# Patient Record
Sex: Female | Born: 1950 | Race: White | Hispanic: No | Marital: Married | Smoking: Never smoker
Health system: Southern US, Community
[De-identification: ages and names within clinical notes are randomized; demographics above are authoritative.]

## PROBLEM LIST (undated history)

## (undated) DIAGNOSIS — Z7189 Other specified counseling: Secondary | ICD-10-CM

## (undated) DIAGNOSIS — G459 Transient cerebral ischemic attack, unspecified: Secondary | ICD-10-CM

## (undated) DIAGNOSIS — Z923 Personal history of irradiation: Secondary | ICD-10-CM

## (undated) DIAGNOSIS — N189 Chronic kidney disease, unspecified: Secondary | ICD-10-CM

## (undated) DIAGNOSIS — Z9221 Personal history of antineoplastic chemotherapy: Secondary | ICD-10-CM

## (undated) DIAGNOSIS — I10 Essential (primary) hypertension: Secondary | ICD-10-CM

## (undated) DIAGNOSIS — C801 Malignant (primary) neoplasm, unspecified: Secondary | ICD-10-CM

## (undated) DIAGNOSIS — C9000 Multiple myeloma not having achieved remission: Secondary | ICD-10-CM

## (undated) DIAGNOSIS — I2699 Other pulmonary embolism without acute cor pulmonale: Secondary | ICD-10-CM

## (undated) DIAGNOSIS — C50911 Malignant neoplasm of unspecified site of right female breast: Secondary | ICD-10-CM

## (undated) DIAGNOSIS — I82409 Acute embolism and thrombosis of unspecified deep veins of unspecified lower extremity: Secondary | ICD-10-CM

## (undated) DIAGNOSIS — D649 Anemia, unspecified: Secondary | ICD-10-CM

## (undated) HISTORY — PX: APPENDECTOMY: SHX54

## (undated) HISTORY — PX: BREAST SURGERY: SHX581

## (undated) HISTORY — DX: Other specified counseling: Z71.89

## (undated) HISTORY — PX: BREAST LUMPECTOMY: SHX2

## (undated) HISTORY — PX: ABDOMINAL HYSTERECTOMY: SHX81

## (undated) HISTORY — DX: Malignant neoplasm of unspecified site of right female breast: C50.911

## (undated) HISTORY — PX: TUBAL LIGATION: SHX77

---

## 2002-09-13 ENCOUNTER — Other Ambulatory Visit: Admission: RE | Admit: 2002-09-13 | Discharge: 2002-09-13 | Payer: Self-pay | Admitting: Obstetrics & Gynecology

## 2005-01-08 ENCOUNTER — Encounter: Admission: RE | Admit: 2005-01-08 | Discharge: 2005-01-08 | Payer: Self-pay

## 2005-02-28 ENCOUNTER — Other Ambulatory Visit: Admission: RE | Admit: 2005-02-28 | Discharge: 2005-02-28 | Payer: Self-pay | Admitting: Obstetrics and Gynecology

## 2005-05-02 ENCOUNTER — Ambulatory Visit: Payer: Self-pay | Admitting: Internal Medicine

## 2005-06-19 ENCOUNTER — Observation Stay (HOSPITAL_COMMUNITY): Admission: RE | Admit: 2005-06-19 | Discharge: 2005-06-20 | Payer: Self-pay | Admitting: Obstetrics and Gynecology

## 2005-08-08 ENCOUNTER — Ambulatory Visit (HOSPITAL_COMMUNITY): Admission: RE | Admit: 2005-08-08 | Discharge: 2005-08-08 | Payer: Self-pay | Admitting: Obstetrics and Gynecology

## 2005-08-13 ENCOUNTER — Ambulatory Visit: Payer: Self-pay | Admitting: Internal Medicine

## 2007-07-30 DIAGNOSIS — I10 Essential (primary) hypertension: Secondary | ICD-10-CM

## 2008-02-17 ENCOUNTER — Encounter: Admission: RE | Admit: 2008-02-17 | Discharge: 2008-02-17 | Payer: Self-pay | Admitting: Internal Medicine

## 2009-03-19 ENCOUNTER — Encounter: Admission: RE | Admit: 2009-03-19 | Discharge: 2009-03-19 | Payer: Self-pay | Admitting: Internal Medicine

## 2009-03-20 ENCOUNTER — Inpatient Hospital Stay (HOSPITAL_COMMUNITY): Admission: EM | Admit: 2009-03-20 | Discharge: 2009-03-27 | Payer: Self-pay | Admitting: Emergency Medicine

## 2009-03-23 ENCOUNTER — Ambulatory Visit: Payer: Self-pay | Admitting: Hematology & Oncology

## 2009-03-27 ENCOUNTER — Encounter (INDEPENDENT_AMBULATORY_CARE_PROVIDER_SITE_OTHER): Payer: Self-pay | Admitting: Interventional Radiology

## 2009-03-29 ENCOUNTER — Ambulatory Visit: Payer: Self-pay | Admitting: Hematology & Oncology

## 2009-03-30 LAB — CBC WITH DIFFERENTIAL (CANCER CENTER ONLY)
BASO%: 0.5 % (ref 0.0–2.0)
EOS%: 2.4 % (ref 0.0–7.0)
MCH: 33.1 pg (ref 26.0–34.0)
MCHC: 35.1 g/dL (ref 32.0–36.0)
MONO%: 5.6 % (ref 0.0–13.0)
NEUT#: 3.7 10*3/uL (ref 1.5–6.5)
Platelets: 232 10*3/uL (ref 145–400)
RBC: 3.06 10*6/uL — ABNORMAL LOW (ref 3.70–5.32)

## 2009-03-30 LAB — CMP (CANCER CENTER ONLY)
AST: 22 U/L (ref 11–38)
BUN, Bld: 60 mg/dL — ABNORMAL HIGH (ref 7–22)
Calcium: 9.4 mg/dL (ref 8.0–10.3)
Chloride: 100 mEq/L (ref 98–108)
Creat: 6.2 mg/dl — ABNORMAL HIGH (ref 0.6–1.2)
Glucose, Bld: 116 mg/dL (ref 73–118)

## 2009-03-30 LAB — CHCC SATELLITE - SMEAR

## 2009-04-03 LAB — SPEP & IFE WITH QIG
Albumin ELP: 63.4 % (ref 55.8–66.1)
Alpha-2-Globulin: 14.1 % — ABNORMAL HIGH (ref 7.1–11.8)
Beta Globulin: 5.5 % (ref 4.7–7.2)
Total Protein, Serum Electrophoresis: 7.4 g/dL (ref 6.0–8.3)

## 2009-04-03 LAB — KAPPA/LAMBDA LIGHT CHAINS: Kappa free light chain: 1550 mg/dL — ABNORMAL HIGH (ref 0.33–1.94)

## 2009-04-16 LAB — CMP (CANCER CENTER ONLY)
AST: 36 U/L (ref 11–38)
Albumin: 3.6 g/dL (ref 3.3–5.5)
BUN, Bld: 39 mg/dL — ABNORMAL HIGH (ref 7–22)
Calcium: 9.1 mg/dL (ref 8.0–10.3)
Chloride: 106 mEq/L (ref 98–108)
Glucose, Bld: 105 mg/dL (ref 73–118)
Potassium: 3.9 mEq/L (ref 3.3–4.7)
Total Protein: 6.8 g/dL (ref 6.4–8.1)

## 2009-04-16 LAB — CBC WITH DIFFERENTIAL (CANCER CENTER ONLY)
Eosinophils Absolute: 0.1 10*3/uL (ref 0.0–0.5)
HCT: 24.4 % — ABNORMAL LOW (ref 34.8–46.6)
LYMPH#: 1.5 10*3/uL (ref 0.9–3.3)
LYMPH%: 36.4 % (ref 14.0–48.0)
MCV: 95 fL (ref 81–101)
MONO#: 0.2 10*3/uL (ref 0.1–0.9)
Platelets: 143 10*3/uL — ABNORMAL LOW (ref 145–400)
RBC: 2.57 10*6/uL — ABNORMAL LOW (ref 3.70–5.32)
WBC: 4.2 10*3/uL (ref 3.9–10.0)

## 2009-04-16 LAB — URINALYSIS, MICROSCOPIC (CHCC SATELLITE)
Ketones: NEGATIVE mg/dL
Protein: 100 mg/dL

## 2009-05-02 LAB — URINALYSIS, MICROSCOPIC (CHCC SATELLITE)
Nitrite: POSITIVE
Protein: 100 mg/dL
Specific Gravity, Urine: 1.01 (ref 1.003–1.035)

## 2009-05-21 ENCOUNTER — Ambulatory Visit: Payer: Self-pay | Admitting: Hematology & Oncology

## 2009-05-22 LAB — CMP (CANCER CENTER ONLY)
ALT(SGPT): 70 U/L — ABNORMAL HIGH (ref 10–47)
Albumin: 3.4 g/dL (ref 3.3–5.5)
Alkaline Phosphatase: 85 U/L — ABNORMAL HIGH (ref 26–84)
CO2: 24 mEq/L (ref 18–33)
Glucose, Bld: 101 mg/dL (ref 73–118)
Potassium: 4.1 mEq/L (ref 3.3–4.7)
Sodium: 139 mEq/L (ref 128–145)
Total Protein: 6.2 g/dL — ABNORMAL LOW (ref 6.4–8.1)

## 2009-05-22 LAB — CBC WITH DIFFERENTIAL (CANCER CENTER ONLY)
Eosinophils Absolute: 0 10*3/uL (ref 0.0–0.5)
MONO#: 0.1 10*3/uL (ref 0.1–0.9)
NEUT#: 0.4 10*3/uL — CL (ref 1.5–6.5)
Platelets: 83 10*3/uL — ABNORMAL LOW (ref 145–400)
RBC: 2.51 10*6/uL — ABNORMAL LOW (ref 3.70–5.32)
WBC: 1.3 10*3/uL — ABNORMAL LOW (ref 3.9–10.0)

## 2009-05-25 LAB — KAPPA/LAMBDA LIGHT CHAINS: Lambda Free Lght Chn: 1.38 mg/dL (ref 0.57–2.63)

## 2009-06-25 ENCOUNTER — Ambulatory Visit: Payer: Self-pay | Admitting: Hematology & Oncology

## 2009-06-26 LAB — CBC WITH DIFFERENTIAL (CANCER CENTER ONLY)
BASO%: 0.6 % (ref 0.0–2.0)
EOS%: 1.4 % (ref 0.0–7.0)
LYMPH#: 1.6 10*3/uL (ref 0.9–3.3)
MCHC: 33.6 g/dL (ref 32.0–36.0)
MONO#: 0.2 10*3/uL (ref 0.1–0.9)
NEUT#: 1.4 10*3/uL — ABNORMAL LOW (ref 1.5–6.5)
Platelets: 262 10*3/uL (ref 145–400)
RDW: 15.1 % — ABNORMAL HIGH (ref 10.5–14.6)
WBC: 3.3 10*3/uL — ABNORMAL LOW (ref 3.9–10.0)

## 2009-06-27 LAB — COMPREHENSIVE METABOLIC PANEL
AST: 30 U/L (ref 0–37)
Albumin: 4.3 g/dL (ref 3.5–5.2)
Alkaline Phosphatase: 85 U/L (ref 39–117)
BUN: 32 mg/dL — ABNORMAL HIGH (ref 6–23)
Creatinine, Ser: 2.96 mg/dL — ABNORMAL HIGH (ref 0.40–1.20)
Potassium: 4 mEq/L (ref 3.5–5.3)

## 2009-06-27 LAB — KAPPA/LAMBDA LIGHT CHAINS: Lambda Free Lght Chn: 1.21 mg/dL (ref 0.57–2.63)

## 2009-07-10 LAB — CBC WITH DIFFERENTIAL (CANCER CENTER ONLY)
BASO%: 1.1 % (ref 0.0–2.0)
EOS%: 3.9 % (ref 0.0–7.0)
HCT: 28.4 % — ABNORMAL LOW (ref 34.8–46.6)
LYMPH%: 43.5 % (ref 14.0–48.0)
MCH: 34.2 pg — ABNORMAL HIGH (ref 26.0–34.0)
MCHC: 33.2 g/dL (ref 32.0–36.0)
MCV: 103 fL — ABNORMAL HIGH (ref 81–101)
MONO#: 0.5 10*3/uL (ref 0.1–0.9)
MONO%: 12.3 % (ref 0.0–13.0)
NEUT%: 39.2 % — ABNORMAL LOW (ref 39.6–80.0)
Platelets: 142 10*3/uL — ABNORMAL LOW (ref 145–400)
RDW: 13.6 % (ref 10.5–14.6)
WBC: 4.1 10*3/uL (ref 3.9–10.0)

## 2009-07-17 LAB — CBC WITH DIFFERENTIAL (CANCER CENTER ONLY)
Eosinophils Absolute: 0.2 10*3/uL (ref 0.0–0.5)
HCT: 28.4 % — ABNORMAL LOW (ref 34.8–46.6)
LYMPH%: 34 % (ref 14.0–48.0)
MCH: 35 pg — ABNORMAL HIGH (ref 26.0–34.0)
MCV: 102 fL — ABNORMAL HIGH (ref 81–101)
MONO%: 8.1 % (ref 0.0–13.0)
NEUT%: 53.4 % (ref 39.6–80.0)
Platelets: 135 10*3/uL — ABNORMAL LOW (ref 145–400)
RBC: 2.77 10*6/uL — ABNORMAL LOW (ref 3.70–5.32)

## 2009-07-24 LAB — CBC WITH DIFFERENTIAL (CANCER CENTER ONLY)
Eosinophils Absolute: 0.2 10*3/uL (ref 0.0–0.5)
HCT: 29.6 % — ABNORMAL LOW (ref 34.8–46.6)
LYMPH#: 1.8 10*3/uL (ref 0.9–3.3)
LYMPH%: 42.8 % (ref 14.0–48.0)
MCV: 101 fL (ref 81–101)
MONO#: 0.2 10*3/uL (ref 0.1–0.9)
Platelets: 93 10*3/uL — ABNORMAL LOW (ref 145–400)
RBC: 2.93 10*6/uL — ABNORMAL LOW (ref 3.70–5.32)
RDW: 12.6 % (ref 10.5–14.6)
WBC: 4.3 10*3/uL (ref 3.9–10.0)

## 2009-07-27 ENCOUNTER — Ambulatory Visit: Payer: Self-pay | Admitting: Hematology & Oncology

## 2009-07-31 LAB — CBC WITH DIFFERENTIAL (CANCER CENTER ONLY)
BASO#: 0 10*3/uL (ref 0.0–0.2)
BASO%: 1.1 % (ref 0.0–2.0)
HCT: 28.5 % — ABNORMAL LOW (ref 34.8–46.6)
HGB: 9.6 g/dL — ABNORMAL LOW (ref 11.6–15.9)
LYMPH#: 1.4 10*3/uL (ref 0.9–3.3)
MONO#: 0.1 10*3/uL (ref 0.1–0.9)
NEUT%: 40.6 % (ref 39.6–80.0)
RBC: 2.8 10*6/uL — ABNORMAL LOW (ref 3.70–5.32)
WBC: 3 10*3/uL — ABNORMAL LOW (ref 3.9–10.0)

## 2009-08-01 LAB — COMPREHENSIVE METABOLIC PANEL
ALT: 47 U/L — ABNORMAL HIGH (ref 0–35)
AST: 18 U/L (ref 0–37)
Alkaline Phosphatase: 74 U/L (ref 39–117)
Calcium: 8.1 mg/dL — ABNORMAL LOW (ref 8.4–10.5)
Chloride: 107 mEq/L (ref 96–112)
Creatinine, Ser: 2.49 mg/dL — ABNORMAL HIGH (ref 0.40–1.20)

## 2009-08-01 LAB — KAPPA/LAMBDA LIGHT CHAINS
Kappa free light chain: 18.5 mg/dL — ABNORMAL HIGH (ref 0.33–1.94)
Lambda Free Lght Chn: 1.18 mg/dL (ref 0.57–2.63)

## 2009-08-01 LAB — FERRITIN: Ferritin: 2298 ng/mL — ABNORMAL HIGH (ref 10–291)

## 2009-08-07 LAB — CBC WITH DIFFERENTIAL (CANCER CENTER ONLY)
HCT: 29.3 % — ABNORMAL LOW (ref 34.8–46.6)
HGB: 9.8 g/dL — ABNORMAL LOW (ref 11.6–15.9)
MCH: 33.8 pg (ref 26.0–34.0)
MCHC: 33.5 g/dL (ref 32.0–36.0)
RBC: 2.9 10*6/uL — ABNORMAL LOW (ref 3.70–5.32)

## 2009-08-07 LAB — MANUAL DIFFERENTIAL (CHCC SATELLITE)
LYMPH: 66 % — ABNORMAL HIGH (ref 14–48)
PLT EST ~~LOC~~: DECREASED
nRBC: 1 % — ABNORMAL HIGH (ref 0–0)

## 2009-08-27 ENCOUNTER — Ambulatory Visit: Payer: Self-pay | Admitting: Hematology & Oncology

## 2009-08-28 LAB — CBC WITH DIFFERENTIAL (CANCER CENTER ONLY)
BASO%: 0.6 % (ref 0.0–2.0)
EOS%: 0.7 % (ref 0.0–7.0)
Eosinophils Absolute: 0 10*3/uL (ref 0.0–0.5)
LYMPH%: 51.2 % — ABNORMAL HIGH (ref 14.0–48.0)
MCH: 33.1 pg (ref 26.0–34.0)
MCHC: 33.6 g/dL (ref 32.0–36.0)
MCV: 99 fL (ref 81–101)
MONO%: 9.3 % (ref 0.0–13.0)
NEUT#: 2 10*3/uL (ref 1.5–6.5)
Platelets: 286 10*3/uL (ref 145–400)
RBC: 3.07 10*6/uL — ABNORMAL LOW (ref 3.70–5.32)
RDW: 13.1 % (ref 10.5–14.6)

## 2009-08-30 LAB — PROTEIN ELECTROPHORESIS, SERUM
Alpha-1-Globulin: 5.4 % — ABNORMAL HIGH (ref 2.9–4.9)
Alpha-2-Globulin: 13.3 % — ABNORMAL HIGH (ref 7.1–11.8)
Beta Globulin: 6.2 % (ref 4.7–7.2)
Gamma Globulin: 9.9 % — ABNORMAL LOW (ref 11.1–18.8)
Total Protein, Serum Electrophoresis: 6.6 g/dL (ref 6.0–8.3)

## 2009-08-30 LAB — LIPID PANEL
LDL Cholesterol: 114 mg/dL — ABNORMAL HIGH (ref 0–99)
Total CHOL/HDL Ratio: 5.2 Ratio
Triglycerides: 232 mg/dL — ABNORMAL HIGH (ref <150)
VLDL: 46 mg/dL — ABNORMAL HIGH (ref 0–40)

## 2009-08-30 LAB — COMPREHENSIVE METABOLIC PANEL
Albumin: 4.2 g/dL (ref 3.5–5.2)
Alkaline Phosphatase: 79 U/L (ref 39–117)
BUN: 27 mg/dL — ABNORMAL HIGH (ref 6–23)
Glucose, Bld: 100 mg/dL — ABNORMAL HIGH (ref 70–99)
Potassium: 3.9 mEq/L (ref 3.5–5.3)

## 2009-09-03 LAB — CBC WITH DIFFERENTIAL (CANCER CENTER ONLY)
BASO%: 0.6 % (ref 0.0–2.0)
Eosinophils Absolute: 0.1 10*3/uL (ref 0.0–0.5)
LYMPH%: 41.4 % (ref 14.0–48.0)
MCV: 99 fL (ref 81–101)
MONO#: 0.5 10*3/uL (ref 0.1–0.9)
NEUT#: 2.7 10*3/uL (ref 1.5–6.5)
Platelets: 218 10*3/uL (ref 145–400)
RBC: 3.11 10*6/uL — ABNORMAL LOW (ref 3.70–5.32)
RDW: 13.2 % (ref 10.5–14.6)
WBC: 5.7 10*3/uL (ref 3.9–10.0)

## 2009-09-11 LAB — CBC WITH DIFFERENTIAL (CANCER CENTER ONLY)
BASO%: 0.5 % (ref 0.0–2.0)
Eosinophils Absolute: 0.2 10*3/uL (ref 0.0–0.5)
HCT: 29.7 % — ABNORMAL LOW (ref 34.8–46.6)
LYMPH#: 2 10*3/uL (ref 0.9–3.3)
MCV: 99 fL (ref 81–101)
MONO#: 0.7 10*3/uL (ref 0.1–0.9)
NEUT%: 54.9 % (ref 39.6–80.0)
Platelets: 210 10*3/uL (ref 145–400)
RBC: 3 10*6/uL — ABNORMAL LOW (ref 3.70–5.32)
RDW: 12.1 % (ref 10.5–14.6)
WBC: 6.3 10*3/uL (ref 3.9–10.0)

## 2009-09-18 LAB — CBC WITH DIFFERENTIAL (CANCER CENTER ONLY)
BASO%: 0.6 % (ref 0.0–2.0)
HCT: 32.1 % — ABNORMAL LOW (ref 34.8–46.6)
LYMPH#: 1.9 10*3/uL (ref 0.9–3.3)
MONO#: 0.4 10*3/uL (ref 0.1–0.9)
NEUT#: 3 10*3/uL (ref 1.5–6.5)
Platelets: 151 10*3/uL (ref 145–400)
RDW: 12.7 % (ref 10.5–14.6)
WBC: 5.6 10*3/uL (ref 3.9–10.0)

## 2009-09-26 ENCOUNTER — Encounter: Admission: RE | Admit: 2009-09-26 | Discharge: 2009-09-26 | Payer: Self-pay | Admitting: Hematology & Oncology

## 2009-10-01 ENCOUNTER — Ambulatory Visit: Payer: Self-pay | Admitting: Hematology & Oncology

## 2009-10-02 ENCOUNTER — Ambulatory Visit: Payer: Self-pay | Admitting: Hematology & Oncology

## 2009-10-02 ENCOUNTER — Encounter: Payer: Self-pay | Admitting: Hematology & Oncology

## 2009-10-02 ENCOUNTER — Ambulatory Visit (HOSPITAL_COMMUNITY): Admission: RE | Admit: 2009-10-02 | Discharge: 2009-10-02 | Payer: Self-pay | Admitting: Hematology & Oncology

## 2009-10-09 LAB — CBC WITH DIFFERENTIAL (CANCER CENTER ONLY)
BASO%: 0.4 % (ref 0.0–2.0)
EOS%: 4.9 % (ref 0.0–7.0)
LYMPH%: 43.2 % (ref 14.0–48.0)
MCH: 32.5 pg (ref 26.0–34.0)
MCHC: 34.8 g/dL (ref 32.0–36.0)
MCV: 93 fL (ref 81–101)
MONO#: 0.2 10*3/uL (ref 0.1–0.9)
MONO%: 4.7 % (ref 0.0–13.0)
NEUT%: 46.8 % (ref 39.6–80.0)
Platelets: 105 10*3/uL — ABNORMAL LOW (ref 145–400)
RDW: 13 % (ref 10.5–14.6)
WBC: 3.1 10*3/uL — ABNORMAL LOW (ref 3.9–10.0)

## 2009-10-11 LAB — BETA 2 MICROGLOBULIN, SERUM: Beta-2 Microglobulin: 4.23 mg/L — ABNORMAL HIGH (ref 1.01–1.73)

## 2009-10-11 LAB — SPEP & IFE WITH QIG
Alpha-2-Globulin: 13.4 % — ABNORMAL HIGH (ref 7.1–11.8)
Beta Globulin: 5.7 % (ref 4.7–7.2)
Gamma Globulin: 9.8 % — ABNORMAL LOW (ref 11.1–18.8)
IgG (Immunoglobin G), Serum: 683 mg/dL — ABNORMAL LOW (ref 694–1618)

## 2009-10-11 LAB — COMPREHENSIVE METABOLIC PANEL
CO2: 21 mEq/L (ref 19–32)
Creatinine, Ser: 2.27 mg/dL — ABNORMAL HIGH (ref 0.40–1.20)
Glucose, Bld: 108 mg/dL — ABNORMAL HIGH (ref 70–99)
Total Bilirubin: 0.6 mg/dL (ref 0.3–1.2)

## 2009-10-11 LAB — KAPPA/LAMBDA LIGHT CHAINS
Kappa free light chain: 10.5 mg/dL — ABNORMAL HIGH (ref 0.33–1.94)
Kappa:Lambda Ratio: 7.19 — ABNORMAL HIGH (ref 0.26–1.65)
Lambda Free Lght Chn: 1.46 mg/dL (ref 0.57–2.63)

## 2009-10-16 LAB — CBC WITH DIFFERENTIAL (CANCER CENTER ONLY)
BASO#: 0 10*3/uL (ref 0.0–0.2)
EOS%: 4.1 % (ref 0.0–7.0)
Eosinophils Absolute: 0.1 10*3/uL (ref 0.0–0.5)
HCT: 30.1 % — ABNORMAL LOW (ref 34.8–46.6)
HGB: 10.5 g/dL — ABNORMAL LOW (ref 11.6–15.9)
LYMPH#: 1.7 10*3/uL (ref 0.9–3.3)
MCH: 32.7 pg (ref 26.0–34.0)
MCHC: 34.9 g/dL (ref 32.0–36.0)
MONO%: 5.7 % (ref 0.0–13.0)
NEUT%: 39.2 % — ABNORMAL LOW (ref 39.6–80.0)
RBC: 3.21 10*6/uL — ABNORMAL LOW (ref 3.70–5.32)

## 2009-10-23 LAB — CBC WITH DIFFERENTIAL (CANCER CENTER ONLY)
BASO#: 0 10*3/uL (ref 0.0–0.2)
EOS%: 3.6 % (ref 0.0–7.0)
Eosinophils Absolute: 0.1 10*3/uL (ref 0.0–0.5)
HGB: 10.1 g/dL — ABNORMAL LOW (ref 11.6–15.9)
LYMPH%: 52.3 % — ABNORMAL HIGH (ref 14.0–48.0)
MCH: 32.4 pg (ref 26.0–34.0)
MCHC: 34.6 g/dL (ref 32.0–36.0)
MCV: 94 fL (ref 81–101)
MONO%: 6.2 % (ref 0.0–13.0)
NEUT#: 1.4 10*3/uL — ABNORMAL LOW (ref 1.5–6.5)

## 2009-12-14 ENCOUNTER — Ambulatory Visit: Payer: Self-pay | Admitting: Hematology & Oncology

## 2009-12-17 LAB — CBC WITH DIFFERENTIAL (CANCER CENTER ONLY)
Eosinophils Absolute: 0 10*3/uL (ref 0.0–0.5)
HCT: 38.5 % (ref 34.8–46.6)
LYMPH%: 34.8 % (ref 14.0–48.0)
MCV: 96 fL (ref 81–101)
MONO#: 0.7 10*3/uL (ref 0.1–0.9)
NEUT%: 52.2 % (ref 39.6–80.0)
RBC: 4.03 10*6/uL (ref 3.70–5.32)
RDW: 13.7 % (ref 10.5–14.6)
WBC: 6.2 10*3/uL (ref 3.9–10.0)

## 2009-12-19 LAB — SPEP & IFE WITH QIG
Beta Globulin: 5.9 % (ref 4.7–7.2)
IgA: 25 mg/dL — ABNORMAL LOW (ref 68–378)
IgG (Immunoglobin G), Serum: 844 mg/dL (ref 694–1618)
Total Protein, Serum Electrophoresis: 6.7 g/dL (ref 6.0–8.3)

## 2009-12-19 LAB — COMPREHENSIVE METABOLIC PANEL
BUN: 27 mg/dL — ABNORMAL HIGH (ref 6–23)
CO2: 19 mEq/L (ref 19–32)
Creatinine, Ser: 1.94 mg/dL — ABNORMAL HIGH (ref 0.40–1.20)
Glucose, Bld: 112 mg/dL — ABNORMAL HIGH (ref 70–99)
Total Bilirubin: 0.9 mg/dL (ref 0.3–1.2)

## 2009-12-19 LAB — KAPPA/LAMBDA LIGHT CHAINS
Kappa free light chain: 3.84 mg/dL — ABNORMAL HIGH (ref 0.33–1.94)
Lambda Free Lght Chn: 0.97 mg/dL (ref 0.57–2.63)

## 2009-12-24 LAB — CBC WITH DIFFERENTIAL (CANCER CENTER ONLY)
BASO#: 0 10*3/uL (ref 0.0–0.2)
EOS%: 0.5 % (ref 0.0–7.0)
HGB: 12.6 g/dL (ref 11.6–15.9)
LYMPH%: 54 % — ABNORMAL HIGH (ref 14.0–48.0)
MCH: 32 pg (ref 26.0–34.0)
MCHC: 33.4 g/dL (ref 32.0–36.0)
MONO%: 8 % (ref 0.0–13.0)
NEUT#: 2.2 10*3/uL (ref 1.5–6.5)

## 2009-12-24 LAB — COMPREHENSIVE METABOLIC PANEL
AST: 18 U/L (ref 0–37)
Albumin: 3.9 g/dL (ref 3.5–5.2)
Alkaline Phosphatase: 154 U/L — ABNORMAL HIGH (ref 39–117)
BUN: 22 mg/dL (ref 6–23)
Creatinine, Ser: 1.77 mg/dL — ABNORMAL HIGH (ref 0.40–1.20)
Potassium: 3.9 mEq/L (ref 3.5–5.3)
Total Bilirubin: 0.5 mg/dL (ref 0.3–1.2)

## 2009-12-31 LAB — CBC WITH DIFFERENTIAL (CANCER CENTER ONLY)
BASO#: 0.1 10*3/uL (ref 0.0–0.2)
BASO%: 0.6 % (ref 0.0–2.0)
EOS%: 1.1 % (ref 0.0–7.0)
HGB: 12.6 g/dL (ref 11.6–15.9)
LYMPH#: 3.9 10*3/uL — ABNORMAL HIGH (ref 0.9–3.3)
MCH: 32.2 pg (ref 26.0–34.0)
MCHC: 33.6 g/dL (ref 32.0–36.0)
MONO%: 6.7 % (ref 0.0–13.0)
NEUT#: 3.5 10*3/uL (ref 1.5–6.5)
Platelets: 121 10*3/uL — ABNORMAL LOW (ref 145–400)

## 2009-12-31 LAB — MAGNESIUM: Magnesium: 1.9 mg/dL (ref 1.5–2.5)

## 2009-12-31 LAB — COMPREHENSIVE METABOLIC PANEL
ALT: 17 U/L (ref 0–35)
AST: 22 U/L (ref 0–37)
Albumin: 4 g/dL (ref 3.5–5.2)
Alkaline Phosphatase: 124 U/L — ABNORMAL HIGH (ref 39–117)
BUN: 23 mg/dL (ref 6–23)
CO2: 23 mEq/L (ref 19–32)
Calcium: 9.5 mg/dL (ref 8.4–10.5)
Chloride: 103 mEq/L (ref 96–112)
Creatinine, Ser: 1.84 mg/dL — ABNORMAL HIGH (ref 0.40–1.20)
Glucose, Bld: 103 mg/dL — ABNORMAL HIGH (ref 70–99)
Potassium: 4.1 mEq/L (ref 3.5–5.3)
Sodium: 139 mEq/L (ref 135–145)
Total Bilirubin: 0.5 mg/dL (ref 0.3–1.2)
Total Protein: 6.8 g/dL (ref 6.0–8.3)

## 2010-01-07 LAB — COMPREHENSIVE METABOLIC PANEL
ALT: 17 U/L (ref 0–35)
Alkaline Phosphatase: 122 U/L — ABNORMAL HIGH (ref 39–117)
CO2: 22 mEq/L (ref 19–32)
Creatinine, Ser: 1.8 mg/dL — ABNORMAL HIGH (ref 0.40–1.20)
Sodium: 139 mEq/L (ref 135–145)
Total Bilirubin: 0.5 mg/dL (ref 0.3–1.2)

## 2010-01-07 LAB — CBC WITH DIFFERENTIAL (CANCER CENTER ONLY)
BASO%: 0.4 % (ref 0.0–2.0)
HCT: 36.8 % (ref 34.8–46.6)
LYMPH%: 36.8 % (ref 14.0–48.0)
MCH: 31.8 pg (ref 26.0–34.0)
MCHC: 33.2 g/dL (ref 32.0–36.0)
MCV: 96 fL (ref 81–101)
MONO#: 0.5 10*3/uL (ref 0.1–0.9)
MONO%: 6.1 % (ref 0.0–13.0)
NEUT%: 54.8 % (ref 39.6–80.0)
Platelets: 128 10*3/uL — ABNORMAL LOW (ref 145–400)
RDW: 15 % — ABNORMAL HIGH (ref 10.5–14.6)
WBC: 7.8 10*3/uL (ref 3.9–10.0)

## 2010-01-07 LAB — MAGNESIUM: Magnesium: 1.9 mg/dL (ref 1.5–2.5)

## 2010-01-18 ENCOUNTER — Ambulatory Visit: Payer: Self-pay | Admitting: Hematology & Oncology

## 2010-01-21 LAB — CBC WITH DIFFERENTIAL (CANCER CENTER ONLY)
Eosinophils Absolute: 0.2 10*3/uL (ref 0.0–0.5)
HCT: 36.6 % (ref 34.8–46.6)
LYMPH#: 2.6 10*3/uL (ref 0.9–3.3)
LYMPH%: 22.6 % (ref 14.0–48.0)
MCV: 95 fL (ref 81–101)
MONO#: 0.7 10*3/uL (ref 0.1–0.9)
NEUT%: 69.3 % (ref 39.6–80.0)
RBC: 3.86 10*6/uL (ref 3.70–5.32)
WBC: 11.7 10*3/uL — ABNORMAL HIGH (ref 3.9–10.0)

## 2010-01-23 LAB — PROTEIN ELECTROPHORESIS, SERUM
Alpha-1-Globulin: 9.2 % — ABNORMAL HIGH (ref 2.9–4.9)
Alpha-2-Globulin: 13.3 % — ABNORMAL HIGH (ref 7.1–11.8)
Beta 2: 3 % — ABNORMAL LOW (ref 3.2–6.5)
Beta Globulin: 5.1 % (ref 4.7–7.2)
Gamma Globulin: 12 % (ref 11.1–18.8)

## 2010-01-23 LAB — IGG, IGA, IGM: IgG (Immunoglobin G), Serum: 930 mg/dL (ref 694–1618)

## 2010-01-23 LAB — BETA 2 MICROGLOBULIN, SERUM: Beta-2 Microglobulin: 3.55 mg/L — ABNORMAL HIGH (ref 1.01–1.73)

## 2010-01-23 LAB — URIC ACID: Uric Acid, Serum: 8.4 mg/dL — ABNORMAL HIGH (ref 2.4–7.0)

## 2010-01-23 LAB — COMPREHENSIVE METABOLIC PANEL
CO2: 22 mEq/L (ref 19–32)
Calcium: 9 mg/dL (ref 8.4–10.5)
Chloride: 106 mEq/L (ref 96–112)
Glucose, Bld: 83 mg/dL (ref 70–99)
Sodium: 139 mEq/L (ref 135–145)
Total Bilirubin: 0.4 mg/dL (ref 0.3–1.2)
Total Protein: 6.6 g/dL (ref 6.0–8.3)

## 2010-01-23 LAB — MAGNESIUM: Magnesium: 2 mg/dL (ref 1.5–2.5)

## 2010-01-23 LAB — KAPPA/LAMBDA LIGHT CHAINS: Kappa:Lambda Ratio: 1.53 (ref 0.26–1.65)

## 2010-02-18 ENCOUNTER — Ambulatory Visit: Payer: Self-pay | Admitting: Hematology & Oncology

## 2010-02-18 LAB — CBC WITH DIFFERENTIAL (CANCER CENTER ONLY)
BASO#: 0.1 10*3/uL (ref 0.0–0.2)
BASO%: 0.6 % (ref 0.0–2.0)
EOS%: 1.1 % (ref 0.0–7.0)
HGB: 12.4 g/dL (ref 11.6–15.9)
LYMPH#: 2 10*3/uL (ref 0.9–3.3)
MCHC: 32.5 g/dL (ref 32.0–36.0)
MONO#: 0.4 10*3/uL (ref 0.1–0.9)
NEUT#: 5 10*3/uL (ref 1.5–6.5)
RDW: 15 % — ABNORMAL HIGH (ref 10.5–14.6)
WBC: 7.6 10*3/uL (ref 3.9–10.0)

## 2010-02-20 LAB — COMPREHENSIVE METABOLIC PANEL
Alkaline Phosphatase: 43 U/L (ref 39–117)
BUN: 38 mg/dL — ABNORMAL HIGH (ref 6–23)
CO2: 24 mEq/L (ref 19–32)
Glucose, Bld: 70 mg/dL (ref 70–99)
Total Bilirubin: 0.5 mg/dL (ref 0.3–1.2)

## 2010-02-20 LAB — PROTEIN ELECTROPHORESIS, SERUM
Albumin ELP: 66.2 % — ABNORMAL HIGH (ref 55.8–66.1)
Alpha-1-Globulin: 4.3 % (ref 2.9–4.9)
Gamma Globulin: 7.6 % — ABNORMAL LOW (ref 11.1–18.8)

## 2010-02-20 LAB — KAPPA/LAMBDA LIGHT CHAINS
Kappa free light chain: 1.08 mg/dL (ref 0.33–1.94)
Kappa:Lambda Ratio: 0.96 (ref 0.26–1.65)
Lambda Free Lght Chn: 1.13 mg/dL (ref 0.57–2.63)

## 2010-02-20 LAB — BETA 2 MICROGLOBULIN, SERUM: Beta-2 Microglobulin: 3.46 mg/L — ABNORMAL HIGH (ref 1.01–1.73)

## 2010-03-18 LAB — CBC WITH DIFFERENTIAL (CANCER CENTER ONLY)
BASO%: 0.5 % (ref 0.0–2.0)
EOS%: 1.1 % (ref 0.0–7.0)
LYMPH%: 34.4 % (ref 14.0–48.0)
MCH: 33.4 pg (ref 26.0–34.0)
MCHC: 33.5 g/dL (ref 32.0–36.0)
MCV: 100 fL (ref 81–101)
MONO%: 6 % (ref 0.0–13.0)
NEUT#: 3.9 10*3/uL (ref 1.5–6.5)
Platelets: 181 10*3/uL (ref 145–400)
RBC: 3.58 10*6/uL — ABNORMAL LOW (ref 3.70–5.32)
RDW: 14.3 % (ref 10.5–14.6)

## 2010-03-20 LAB — COMPREHENSIVE METABOLIC PANEL
AST: 12 U/L (ref 0–37)
Albumin: 4.4 g/dL (ref 3.5–5.2)
Alkaline Phosphatase: 51 U/L (ref 39–117)
BUN: 21 mg/dL (ref 6–23)
Creatinine, Ser: 2.34 mg/dL — ABNORMAL HIGH (ref 0.40–1.20)
Glucose, Bld: 109 mg/dL — ABNORMAL HIGH (ref 70–99)
Potassium: 4.4 mEq/L (ref 3.5–5.3)
Total Bilirubin: 0.5 mg/dL (ref 0.3–1.2)

## 2010-03-20 LAB — PROTEIN ELECTROPHORESIS, SERUM
Alpha-1-Globulin: 5.7 % — ABNORMAL HIGH (ref 2.9–4.9)
Alpha-2-Globulin: 13.9 % — ABNORMAL HIGH (ref 7.1–11.8)
Gamma Globulin: 8.2 % — ABNORMAL LOW (ref 11.1–18.8)
Total Protein, Serum Electrophoresis: 6.7 g/dL (ref 6.0–8.3)

## 2010-03-20 LAB — KAPPA/LAMBDA LIGHT CHAINS
Kappa:Lambda Ratio: 1.78 — ABNORMAL HIGH (ref 0.26–1.65)
Lambda Free Lght Chn: 1.06 mg/dL (ref 0.57–2.63)

## 2010-03-20 LAB — RETICULOCYTES (CHCC)
RBC.: 3.59 MIL/uL — ABNORMAL LOW (ref 3.87–5.11)
Retic Ct Pct: 1.7 % (ref 0.4–3.1)

## 2010-03-20 LAB — BETA 2 MICROGLOBULIN, SERUM: Beta-2 Microglobulin: 4.22 mg/L — ABNORMAL HIGH (ref 1.01–1.73)

## 2010-04-01 ENCOUNTER — Ambulatory Visit: Payer: Self-pay | Admitting: Hematology & Oncology

## 2010-04-02 ENCOUNTER — Ambulatory Visit: Payer: Self-pay | Admitting: Hematology & Oncology

## 2010-04-02 ENCOUNTER — Ambulatory Visit (HOSPITAL_COMMUNITY)
Admission: RE | Admit: 2010-04-02 | Discharge: 2010-04-02 | Payer: Self-pay | Source: Home / Self Care | Admitting: Hematology & Oncology

## 2010-04-24 LAB — CBC WITH DIFFERENTIAL (CANCER CENTER ONLY)
BASO#: 0 10*3/uL (ref 0.0–0.2)
EOS%: 2.8 % (ref 0.0–7.0)
Eosinophils Absolute: 0.2 10*3/uL (ref 0.0–0.5)
HGB: 11.9 g/dL (ref 11.6–15.9)
LYMPH#: 1.9 10*3/uL (ref 0.9–3.3)
MONO#: 0.3 10*3/uL (ref 0.1–0.9)
NEUT#: 3.3 10*3/uL (ref 1.5–6.5)
RBC: 3.61 10*6/uL — ABNORMAL LOW (ref 3.70–5.32)
WBC: 5.6 10*3/uL (ref 3.9–10.0)

## 2010-04-26 LAB — COMPREHENSIVE METABOLIC PANEL
ALT: 12 U/L (ref 0–35)
Alkaline Phosphatase: 69 U/L (ref 39–117)
Chloride: 107 mEq/L (ref 96–112)
Creatinine, Ser: 1.85 mg/dL — ABNORMAL HIGH (ref 0.40–1.20)
Potassium: 4.1 mEq/L (ref 3.5–5.3)
Total Bilirubin: 0.6 mg/dL (ref 0.3–1.2)
Total Protein: 6.4 g/dL (ref 6.0–8.3)

## 2010-04-26 LAB — SPEP & IFE WITH QIG
Alpha-2-Globulin: 12.6 % — ABNORMAL HIGH (ref 7.1–11.8)
Beta Globulin: 4.9 % (ref 4.7–7.2)
Gamma Globulin: 10.6 % — ABNORMAL LOW (ref 11.1–18.8)
IgA: 49 mg/dL — ABNORMAL LOW (ref 68–378)
IgG (Immunoglobin G), Serum: 771 mg/dL (ref 694–1618)
Total Protein, Serum Electrophoresis: 6.4 g/dL (ref 6.0–8.3)

## 2010-04-26 LAB — BETA 2 MICROGLOBULIN, SERUM: Beta-2 Microglobulin: 3.84 mg/L — ABNORMAL HIGH (ref 1.01–1.73)

## 2010-04-26 LAB — KAPPA/LAMBDA LIGHT CHAINS
Kappa free light chain: 1.55 mg/dL (ref 0.33–1.94)
Lambda Free Lght Chn: 0.86 mg/dL (ref 0.57–2.63)

## 2010-07-08 ENCOUNTER — Ambulatory Visit: Payer: Self-pay | Admitting: Hematology & Oncology

## 2010-07-10 LAB — CBC WITH DIFFERENTIAL (CANCER CENTER ONLY)
BASO%: 0.4 % (ref 0.0–2.0)
Eosinophils Absolute: 0.5 10*3/uL (ref 0.0–0.5)
LYMPH%: 36.8 % (ref 14.0–48.0)
MCV: 94 fL (ref 81–101)
MONO#: 0.5 10*3/uL (ref 0.1–0.9)
MONO%: 7.2 % (ref 0.0–13.0)
NEUT#: 3.2 10*3/uL (ref 1.5–6.5)
Platelets: 254 10*3/uL (ref 145–400)
RBC: 4.08 10*6/uL (ref 3.70–5.32)
RDW: 12.5 % (ref 10.5–14.6)
WBC: 6.8 10*3/uL (ref 3.9–10.0)

## 2010-07-15 LAB — SPEP & IFE WITH QIG
Albumin ELP: 55.1 % — ABNORMAL LOW (ref 55.8–66.1)
Alpha-1-Globulin: 5.8 % — ABNORMAL HIGH (ref 2.9–4.9)
IgA: 85 mg/dL (ref 68–378)
IgM, Serum: 89 mg/dL (ref 60–263)
Total Protein, Serum Electrophoresis: 7.4 g/dL (ref 6.0–8.3)

## 2010-07-15 LAB — COMPREHENSIVE METABOLIC PANEL
ALT: 22 U/L (ref 0–35)
Albumin: 4.3 g/dL (ref 3.5–5.2)
Alkaline Phosphatase: 145 U/L — ABNORMAL HIGH (ref 39–117)
CO2: 22 mEq/L (ref 19–32)
Potassium: 4.2 mEq/L (ref 3.5–5.3)
Sodium: 138 mEq/L (ref 135–145)
Total Bilirubin: 0.5 mg/dL (ref 0.3–1.2)
Total Protein: 7.4 g/dL (ref 6.0–8.3)

## 2010-09-03 ENCOUNTER — Ambulatory Visit: Payer: Self-pay | Admitting: Hematology & Oncology

## 2010-09-04 LAB — CBC WITH DIFFERENTIAL (CANCER CENTER ONLY)
BASO#: 0 10*3/uL (ref 0.0–0.2)
Eosinophils Absolute: 0.3 10*3/uL (ref 0.0–0.5)
HCT: 40.6 % (ref 34.8–46.6)
HGB: 13.4 g/dL (ref 11.6–15.9)
LYMPH#: 2.4 10*3/uL (ref 0.9–3.3)
LYMPH%: 57.5 % — ABNORMAL HIGH (ref 14.0–48.0)
MCV: 96 fL (ref 81–101)
MONO#: 0.3 10*3/uL (ref 0.1–0.9)
NEUT%: 29 % — ABNORMAL LOW (ref 39.6–80.0)
RBC: 4.22 10*6/uL (ref 3.70–5.32)
WBC: 4.1 10*3/uL (ref 3.9–10.0)

## 2010-09-06 LAB — KAPPA/LAMBDA LIGHT CHAINS
Kappa free light chain: 2.1 mg/dL — ABNORMAL HIGH (ref 0.33–1.94)
Lambda Free Lght Chn: 2.38 mg/dL (ref 0.57–2.63)

## 2010-09-06 LAB — SPEP & IFE WITH QIG
Albumin ELP: 58 % (ref 55.8–66.1)
Alpha-2-Globulin: 10.6 % (ref 7.1–11.8)
Beta 2: 2.5 % — ABNORMAL LOW (ref 3.2–6.5)
Beta Globulin: 5.9 % (ref 4.7–7.2)
IgA: 77 mg/dL (ref 68–378)
IgG (Immunoglobin G), Serum: 1150 mg/dL (ref 694–1618)
Total Protein, Serum Electrophoresis: 7 g/dL (ref 6.0–8.3)

## 2010-09-06 LAB — COMPREHENSIVE METABOLIC PANEL
CO2: 22 mEq/L (ref 19–32)
Calcium: 8.9 mg/dL (ref 8.4–10.5)
Chloride: 105 mEq/L (ref 96–112)
Creatinine, Ser: 1.93 mg/dL — ABNORMAL HIGH (ref 0.40–1.20)
Glucose, Bld: 91 mg/dL (ref 70–99)
Total Bilirubin: 0.5 mg/dL (ref 0.3–1.2)
Total Protein: 7 g/dL (ref 6.0–8.3)

## 2010-10-15 ENCOUNTER — Encounter: Admission: RE | Admit: 2010-10-15 | Discharge: 2010-10-15 | Payer: Self-pay | Admitting: Hematology & Oncology

## 2010-11-05 ENCOUNTER — Ambulatory Visit (HOSPITAL_BASED_OUTPATIENT_CLINIC_OR_DEPARTMENT_OTHER): Payer: Managed Care, Other (non HMO) | Admitting: Hematology & Oncology

## 2010-11-07 LAB — CBC WITH DIFFERENTIAL (CANCER CENTER ONLY)
BASO#: 0 10*3/uL (ref 0.0–0.2)
Eosinophils Absolute: 0.1 10*3/uL (ref 0.0–0.5)
HCT: 40.5 % (ref 34.8–46.6)
HGB: 13.4 g/dL (ref 11.6–15.9)
LYMPH#: 2.7 10*3/uL (ref 0.9–3.3)
LYMPH%: 54.8 % — ABNORMAL HIGH (ref 14.0–48.0)
MCV: 97 fL (ref 81–101)
MONO#: 0.2 10*3/uL (ref 0.1–0.9)
NEUT%: 39 % — ABNORMAL LOW (ref 39.6–80.0)
RBC: 4.16 10*6/uL (ref 3.70–5.32)
WBC: 5 10*3/uL (ref 3.9–10.0)

## 2010-11-11 LAB — COMPREHENSIVE METABOLIC PANEL
ALT: 19 U/L (ref 0–35)
AST: 18 U/L (ref 0–37)
Albumin: 4.3 g/dL (ref 3.5–5.2)
Alkaline Phosphatase: 79 U/L (ref 39–117)
Glucose, Bld: 93 mg/dL (ref 70–99)
Potassium: 4.1 mEq/L (ref 3.5–5.3)
Sodium: 138 mEq/L (ref 135–145)
Total Bilirubin: 0.5 mg/dL (ref 0.3–1.2)
Total Protein: 7.2 g/dL (ref 6.0–8.3)

## 2010-11-11 LAB — RETICULOCYTES (CHCC)
ABS Retic: 65.6 10*3/uL (ref 19.0–186.0)
RBC.: 4.1 MIL/uL (ref 3.87–5.11)
Retic Ct Pct: 1.6 % (ref 0.4–3.1)

## 2010-11-11 LAB — SPEP & IFE WITH QIG
Alpha-1-Globulin: 4.7 % (ref 2.9–4.9)
Alpha-2-Globulin: 11.5 % (ref 7.1–11.8)
Beta 2: 4.5 % (ref 3.2–6.5)
Gamma Globulin: 15.3 % (ref 11.1–18.8)
IgG (Immunoglobin G), Serum: 1180 mg/dL (ref 694–1618)
IgM, Serum: 72 mg/dL (ref 60–263)

## 2010-11-11 LAB — BETA 2 MICROGLOBULIN, SERUM: Beta-2 Microglobulin: 4.04 mg/L — ABNORMAL HIGH (ref 1.01–1.73)

## 2010-11-11 LAB — FERRITIN: Ferritin: 305 ng/mL — ABNORMAL HIGH (ref 10–291)

## 2010-11-11 LAB — KAPPA/LAMBDA LIGHT CHAINS: Kappa free light chain: 2.49 mg/dL — ABNORMAL HIGH (ref 0.33–1.94)

## 2010-11-12 ENCOUNTER — Ambulatory Visit (HOSPITAL_COMMUNITY)
Admission: RE | Admit: 2010-11-12 | Discharge: 2010-11-12 | Payer: Self-pay | Source: Home / Self Care | Attending: Hematology & Oncology | Admitting: Hematology & Oncology

## 2011-01-09 ENCOUNTER — Encounter (HOSPITAL_BASED_OUTPATIENT_CLINIC_OR_DEPARTMENT_OTHER): Payer: Managed Care, Other (non HMO) | Admitting: Hematology & Oncology

## 2011-01-09 DIAGNOSIS — C9000 Multiple myeloma not having achieved remission: Secondary | ICD-10-CM

## 2011-01-09 LAB — CBC WITH DIFFERENTIAL (CANCER CENTER ONLY)
BASO#: 0 10*3/uL (ref 0.0–0.2)
EOS%: 2.6 % (ref 0.0–7.0)
Eosinophils Absolute: 0.1 10*3/uL (ref 0.0–0.5)
HGB: 13.7 g/dL (ref 11.6–15.9)
LYMPH%: 57.8 % — ABNORMAL HIGH (ref 14.0–48.0)
MCH: 32 pg (ref 26.0–34.0)
MCHC: 33.1 g/dL (ref 32.0–36.0)
MCV: 97 fL (ref 81–101)
MONO%: 5.5 % (ref 0.0–13.0)
NEUT#: 1.9 10*3/uL (ref 1.5–6.5)
NEUT%: 33.4 % — ABNORMAL LOW (ref 39.6–80.0)
RBC: 4.27 10*6/uL (ref 3.70–5.32)

## 2011-01-15 LAB — KAPPA/LAMBDA LIGHT CHAINS
Kappa free light chain: 3.08 mg/dL — ABNORMAL HIGH (ref 0.33–1.94)
Kappa:Lambda Ratio: 1.57 (ref 0.26–1.65)
Lambda Free Lght Chn: 1.96 mg/dL (ref 0.57–2.63)

## 2011-01-15 LAB — SPEP & IFE WITH QIG
Alpha-1-Globulin: 4.3 % (ref 2.9–4.9)
Alpha-2-Globulin: 11.1 % (ref 7.1–11.8)
Beta Globulin: 5.6 % (ref 4.7–7.2)
Total Protein, Serum Electrophoresis: 6.9 g/dL (ref 6.0–8.3)

## 2011-01-15 LAB — COMPREHENSIVE METABOLIC PANEL
AST: 17 U/L (ref 0–37)
Albumin: 4.2 g/dL (ref 3.5–5.2)
Alkaline Phosphatase: 69 U/L (ref 39–117)
BUN: 19 mg/dL (ref 6–23)
Creatinine, Ser: 1.71 mg/dL — ABNORMAL HIGH (ref 0.40–1.20)
Glucose, Bld: 91 mg/dL (ref 70–99)
Potassium: 4 mEq/L (ref 3.5–5.3)
Total Bilirubin: 0.6 mg/dL (ref 0.3–1.2)

## 2011-01-15 LAB — FERRITIN: Ferritin: 340 ng/mL — ABNORMAL HIGH (ref 10–291)

## 2011-02-03 LAB — DIFFERENTIAL
Basophils Absolute: 0 10*3/uL (ref 0.0–0.1)
Eosinophils Relative: 2 % (ref 0–5)
Lymphocytes Relative: 47 % — ABNORMAL HIGH (ref 12–46)
Lymphs Abs: 2.6 10*3/uL (ref 0.7–4.0)
Monocytes Relative: 8 % (ref 3–12)

## 2011-02-03 LAB — CBC
HCT: 36.4 % (ref 36.0–46.0)
MCV: 99.2 fL (ref 78.0–100.0)
Platelets: 163 10*3/uL (ref 150–400)
RBC: 3.67 MIL/uL — ABNORMAL LOW (ref 3.87–5.11)
RDW: 14.2 % (ref 11.5–15.5)
WBC: 5.4 10*3/uL (ref 4.0–10.5)

## 2011-02-03 LAB — TISSUE HYBRIDIZATION (BONE MARROW)-NCBH

## 2011-02-03 LAB — BONE MARROW EXAM

## 2011-02-11 LAB — DIFFERENTIAL
Lymphocytes Relative: 18 % (ref 12–46)
Lymphs Abs: 1.5 10*3/uL (ref 0.7–4.0)
Monocytes Absolute: 0.6 10*3/uL (ref 0.1–1.0)
Monocytes Relative: 8 % (ref 3–12)
Neutro Abs: 6.1 10*3/uL (ref 1.7–7.7)
Neutrophils Relative %: 73 % (ref 43–77)

## 2011-02-11 LAB — CBC
Hemoglobin: 10.6 g/dL — ABNORMAL LOW (ref 12.0–15.0)
MCHC: 33.6 g/dL (ref 30.0–36.0)
RBC: 3.16 MIL/uL — ABNORMAL LOW (ref 3.87–5.11)
WBC: 8.3 10*3/uL (ref 4.0–10.5)

## 2011-02-11 LAB — CHROMOSOME ANALYSIS, BONE MARROW

## 2011-02-11 LAB — TISSUE HYBRIDIZATION (BONE MARROW)-NCBH

## 2011-02-26 LAB — CBC
HCT: 31.3 % — ABNORMAL LOW (ref 36.0–46.0)
Hemoglobin: 10.6 g/dL — ABNORMAL LOW (ref 12.0–15.0)
RBC: 3.2 MIL/uL — ABNORMAL LOW (ref 3.87–5.11)
RDW: 15.2 % (ref 11.5–15.5)
WBC: 4.9 10*3/uL (ref 4.0–10.5)

## 2011-02-26 LAB — DIFFERENTIAL
Basophils Absolute: 0 10*3/uL (ref 0.0–0.1)
Eosinophils Relative: 3 % (ref 0–5)
Lymphocytes Relative: 32 % (ref 12–46)
Lymphs Abs: 1.6 10*3/uL (ref 0.7–4.0)
Monocytes Absolute: 0.3 10*3/uL (ref 0.1–1.0)
Monocytes Relative: 7 % (ref 3–12)
Neutro Abs: 2.8 10*3/uL (ref 1.7–7.7)

## 2011-03-04 LAB — DIFFERENTIAL
Eosinophils Absolute: 0.2 10*3/uL (ref 0.0–0.7)
Eosinophils Relative: 3 % (ref 0–5)
Lymphs Abs: 2.1 10*3/uL (ref 0.7–4.0)

## 2011-03-04 LAB — CBC
HCT: 29.4 % — ABNORMAL LOW (ref 36.0–46.0)
HCT: 29.9 % — ABNORMAL LOW (ref 36.0–46.0)
Hemoglobin: 9.1 g/dL — ABNORMAL LOW (ref 12.0–15.0)
MCV: 95 fL (ref 78.0–100.0)
MCV: 95.3 fL (ref 78.0–100.0)
MCV: 95.4 fL (ref 78.0–100.0)
Platelets: 194 10*3/uL (ref 150–400)
Platelets: 203 10*3/uL (ref 150–400)
RBC: 2.73 MIL/uL — ABNORMAL LOW (ref 3.87–5.11)
RDW: 14.1 % (ref 11.5–15.5)
WBC: 5.8 10*3/uL (ref 4.0–10.5)
WBC: 6.9 10*3/uL (ref 4.0–10.5)
WBC: 7 10*3/uL (ref 4.0–10.5)

## 2011-03-04 LAB — URINE CULTURE
Colony Count: >=100000
Colony Count: >=100000
Special Requests: POSITIVE

## 2011-03-04 LAB — URINALYSIS, ROUTINE W REFLEX MICROSCOPIC
Bilirubin Urine: NEGATIVE
Ketones, ur: NEGATIVE mg/dL
Specific Gravity, Urine: 1.01 (ref 1.005–1.030)
Urobilinogen, UA: 0.2 mg/dL (ref 0.0–1.0)

## 2011-03-04 LAB — RENAL FUNCTION PANEL
BUN: 57 mg/dL — ABNORMAL HIGH (ref 6–23)
BUN: 62 mg/dL — ABNORMAL HIGH (ref 6–23)
CO2: 21 mEq/L (ref 19–32)
CO2: 22 mEq/L (ref 19–32)
CO2: 23 mEq/L (ref 19–32)
Calcium: 9.6 mg/dL (ref 8.4–10.5)
Calcium: 9.9 mg/dL (ref 8.4–10.5)
Chloride: 109 mEq/L (ref 96–112)
Creatinine, Ser: 6.62 mg/dL — ABNORMAL HIGH (ref 0.4–1.2)
Creatinine, Ser: 6.7 mg/dL — ABNORMAL HIGH (ref 0.4–1.2)
GFR calc Af Amer: 7 mL/min — ABNORMAL LOW (ref >60)
GFR calc Af Amer: 8 mL/min — ABNORMAL LOW (ref >60)
GFR calc non Af Amer: 6 mL/min — ABNORMAL LOW (ref >60)
GFR calc non Af Amer: 6 mL/min — ABNORMAL LOW (ref >60)
Glucose, Bld: 107 mg/dL — ABNORMAL HIGH (ref 70–99)
Glucose, Bld: 85 mg/dL (ref 70–99)
Sodium: 141 mEq/L (ref 135–145)

## 2011-03-04 LAB — URINE MICROSCOPIC-ADD ON

## 2011-03-04 LAB — BONE MARROW EXAM

## 2011-03-04 LAB — TISSUE HYBRIDIZATION (BONE MARROW)-NCBH

## 2011-03-05 LAB — UIFE/LIGHT CHAINS/TP QN, 24-HR UR
Albumin, U: DETECTED
Alpha 1, Urine: DETECTED — AB
Alpha 1, Urine: DETECTED — AB
Alpha 2, Urine: DETECTED — AB
Alpha 2, Urine: DETECTED — AB
Beta, Urine: DETECTED — AB
Free Kappa Lt Chains,Ur: 1480 mg/dL — ABNORMAL HIGH (ref 0.04–1.51)
Free Kappa Lt Chains,Ur: 3800 mg/dL — ABNORMAL HIGH (ref 0.04–1.51)
Free Kappa/Lambda Ratio: 6785.71 ratio — ABNORMAL HIGH (ref 0.46–4.00)
Gamma Globulin, Urine: DETECTED — AB
Volume, Urine: 2500 mL

## 2011-03-05 LAB — RENAL FUNCTION PANEL
BUN: 57 mg/dL — ABNORMAL HIGH (ref 6–23)
BUN: 59 mg/dL — ABNORMAL HIGH (ref 6–23)
CO2: 18 mEq/L — ABNORMAL LOW (ref 19–32)
CO2: 20 mEq/L (ref 19–32)
Calcium: 7.8 mg/dL — ABNORMAL LOW (ref 8.4–10.5)
Chloride: 107 mEq/L (ref 96–112)
Creatinine, Ser: 7.8 mg/dL — ABNORMAL HIGH (ref 0.4–1.2)
GFR calc Af Amer: 6 mL/min — ABNORMAL LOW (ref >60)
GFR calc non Af Amer: 5 mL/min — ABNORMAL LOW (ref >60)
Glucose, Bld: 101 mg/dL — ABNORMAL HIGH (ref 70–99)
Glucose, Bld: 98 mg/dL (ref 70–99)
Potassium: 4.6 mEq/L (ref 3.5–5.1)

## 2011-03-05 LAB — PROTEIN ELECTROPH W RFLX QUANT IMMUNOGLOBULINS
Beta 2: 4.1 % (ref 3.2–6.5)
Beta Globulin: 6.1 % (ref 4.7–7.2)
M-Spike, %: 0.21 g/dL

## 2011-03-05 LAB — BASIC METABOLIC PANEL
BUN: 61 mg/dL — ABNORMAL HIGH (ref 6–23)
CO2: 19 mEq/L (ref 19–32)
Calcium: 8.3 mg/dL — ABNORMAL LOW (ref 8.4–10.5)
Chloride: 108 mEq/L (ref 96–112)
Creatinine, Ser: 10.03 mg/dL — ABNORMAL HIGH (ref 0.4–1.2)
GFR calc Af Amer: 5 mL/min — ABNORMAL LOW (ref >60)
GFR calc Af Amer: 5 mL/min — ABNORMAL LOW (ref >60)
GFR calc non Af Amer: 4 mL/min — ABNORMAL LOW (ref >60)
GFR calc non Af Amer: 4 mL/min — ABNORMAL LOW (ref >60)
Glucose, Bld: 113 mg/dL — ABNORMAL HIGH (ref 70–99)
Glucose, Bld: 96 mg/dL (ref 70–99)
Potassium: 4.6 mEq/L (ref 3.5–5.1)
Potassium: 5 mEq/L (ref 3.5–5.1)
Sodium: 138 mEq/L (ref 135–145)
Sodium: 139 mEq/L (ref 135–145)

## 2011-03-05 LAB — COMPREHENSIVE METABOLIC PANEL
ALT: 12 U/L (ref 0–35)
AST: 12 U/L (ref 0–37)
AST: 16 U/L (ref 0–37)
Albumin: 3.6 g/dL (ref 3.5–5.2)
Alkaline Phosphatase: 52 U/L (ref 39–117)
Alkaline Phosphatase: 53 U/L (ref 39–117)
Chloride: 107 mEq/L (ref 96–112)
GFR calc Af Amer: 6 mL/min — ABNORMAL LOW (ref >60)
Glucose, Bld: 109 mg/dL — ABNORMAL HIGH (ref 70–99)
Potassium: 4.4 mEq/L (ref 3.5–5.1)
Potassium: 5.5 mEq/L — ABNORMAL HIGH (ref 3.5–5.1)
Sodium: 137 mEq/L (ref 135–145)
Total Bilirubin: 0.7 mg/dL (ref 0.3–1.2)
Total Protein: 6.7 g/dL (ref 6.0–8.3)

## 2011-03-05 LAB — CBC
HCT: 26.2 % — ABNORMAL LOW (ref 36.0–46.0)
Hemoglobin: 10.1 g/dL — ABNORMAL LOW (ref 12.0–15.0)
Hemoglobin: 9.4 g/dL — ABNORMAL LOW (ref 12.0–15.0)
MCHC: 36 g/dL (ref 30.0–36.0)
MCV: 95.3 fL (ref 78.0–100.0)
Platelets: 162 10*3/uL (ref 150–400)
RBC: 3 MIL/uL — ABNORMAL LOW (ref 3.87–5.11)
RDW: 14 % (ref 11.5–15.5)
RDW: 14.4 % (ref 11.5–15.5)

## 2011-03-05 LAB — PROTEIN ELECTROPHORESIS, SERUM
Albumin ELP: 62.7 % (ref 55.8–66.1)
Beta 2: 3.6 % (ref 3.2–6.5)
Gamma Globulin: 8.3 % — ABNORMAL LOW (ref 11.1–18.8)
M-Spike, %: 0.2 g/dL

## 2011-03-05 LAB — URINALYSIS, ROUTINE W REFLEX MICROSCOPIC
Glucose, UA: NEGATIVE mg/dL
Ketones, ur: NEGATIVE mg/dL
Nitrite: NEGATIVE
Protein, ur: 100 mg/dL — AB
Specific Gravity, Urine: 1.011 (ref 1.005–1.030)
Urobilinogen, UA: 0.2 mg/dL (ref 0.0–1.0)
pH: 6 (ref 5.0–8.0)

## 2011-03-05 LAB — URINE CULTURE: Colony Count: >=100000

## 2011-03-05 LAB — LIPID PANEL
HDL: 34 mg/dL — ABNORMAL LOW (ref >39)
LDL Cholesterol: 156 mg/dL — ABNORMAL HIGH (ref 0–99)
Total CHOL/HDL Ratio: 6.7 RATIO
Triglycerides: 184 mg/dL — ABNORMAL HIGH (ref <150)
VLDL: 37 mg/dL (ref 0–40)

## 2011-03-05 LAB — IMMUNOFIXATION ELECTROPHORESIS
IgA: 15 mg/dL — ABNORMAL LOW (ref 68–378)
IgG (Immunoglobin G), Serum: 356 mg/dL — ABNORMAL LOW (ref 694–1618)

## 2011-03-05 LAB — IRON AND TIBC: Saturation Ratios: 16 % — ABNORMAL LOW (ref 20–55)

## 2011-03-05 LAB — T4, FREE: Free T4: 0.84 ng/dL — ABNORMAL LOW (ref 0.80–1.80)

## 2011-03-05 LAB — URINE MICROSCOPIC-ADD ON

## 2011-03-05 LAB — IMMUNOFIXATION ADD-ON

## 2011-03-05 LAB — IGG, IGA, IGM
IgA: 14 mg/dL — ABNORMAL LOW (ref 68–378)
IgA: 14 mg/dL — ABNORMAL LOW (ref 68–378)
IgG (Immunoglobin G), Serum: 377 mg/dL — ABNORMAL LOW (ref 694–1618)
IgG (Immunoglobin G), Serum: 403 mg/dL — ABNORMAL LOW (ref 694–1618)
IgM, Serum: 8 mg/dL — ABNORMAL LOW (ref 60–263)
IgM, Serum: 9 mg/dL — ABNORMAL LOW (ref 60–263)

## 2011-03-05 LAB — HEMOCCULT GUIAC POC 1CARD (OFFICE): Fecal Occult Bld: NEGATIVE

## 2011-03-05 LAB — FOLATE: Folate: 16.5 ng/mL

## 2011-03-05 LAB — CREATININE, URINE, RANDOM: Creatinine, Urine: 59.9 mg/dL

## 2011-03-05 LAB — PHOSPHORUS: Phosphorus: 8.1 mg/dL — ABNORMAL HIGH (ref 2.3–4.6)

## 2011-03-05 LAB — RETICULOCYTES
Retic Count, Absolute: 23.8 10*3/uL (ref 19.0–186.0)
Retic Ct Pct: 0.9 % (ref 0.4–3.1)

## 2011-03-05 LAB — TECHNOLOGIST SMEAR REVIEW

## 2011-03-05 LAB — C4 COMPLEMENT: Complement C4, Body Fluid: 41 mg/dL (ref 16–47)

## 2011-03-27 ENCOUNTER — Encounter (HOSPITAL_BASED_OUTPATIENT_CLINIC_OR_DEPARTMENT_OTHER): Payer: Managed Care, Other (non HMO) | Admitting: Hematology & Oncology

## 2011-03-27 ENCOUNTER — Other Ambulatory Visit: Payer: Self-pay | Admitting: Hematology & Oncology

## 2011-03-27 DIAGNOSIS — C9000 Multiple myeloma not having achieved remission: Secondary | ICD-10-CM

## 2011-03-27 LAB — CBC WITH DIFFERENTIAL (CANCER CENTER ONLY)
BASO%: 0.4 % (ref 0.0–2.0)
Eosinophils Absolute: 0.3 10*3/uL (ref 0.0–0.5)
HCT: 40.5 % (ref 34.8–46.6)
LYMPH%: 58.3 % — ABNORMAL HIGH (ref 14.0–48.0)
MCH: 31.3 pg (ref 26.0–34.0)
MCV: 94 fL (ref 81–101)
MONO%: 7.3 % (ref 0.0–13.0)
NEUT%: 29.3 % — ABNORMAL LOW (ref 39.6–80.0)
Platelets: 170 10*3/uL (ref 145–400)
RDW: 14 % (ref 11.1–15.7)

## 2011-03-31 LAB — SPEP & IFE WITH QIG
Beta 2: 4.8 % (ref 3.2–6.5)
Gamma Globulin: 16.7 % (ref 11.1–18.8)
IgA: 110 mg/dL (ref 68–378)
IgM, Serum: 63 mg/dL (ref 60–263)

## 2011-03-31 LAB — COMPREHENSIVE METABOLIC PANEL
AST: 19 U/L (ref 0–37)
Alkaline Phosphatase: 68 U/L (ref 39–117)
BUN: 20 mg/dL (ref 6–23)
Creatinine, Ser: 1.78 mg/dL — ABNORMAL HIGH (ref 0.40–1.20)
Potassium: 4.1 mEq/L (ref 3.5–5.3)

## 2011-03-31 LAB — KAPPA/LAMBDA LIGHT CHAINS
Kappa:Lambda Ratio: 1.25 (ref 0.26–1.65)
Lambda Free Lght Chn: 2.94 mg/dL — ABNORMAL HIGH (ref 0.57–2.63)

## 2011-03-31 LAB — RETICULOCYTES (CHCC)
ABS Retic: 56.4 10*3/uL (ref 19.0–186.0)
RBC.: 4.34 MIL/uL (ref 3.87–5.11)
Retic Ct Pct: 1.3 % (ref 0.4–3.1)

## 2011-04-08 NOTE — Consult Note (Signed)
Marcia [name], Marcia [name]             ACCOUNT NO.:  [id number]   MEDICAL RECORD NO.:  [id number]          PATIENT TYPE:  INP   LOCATION:  6714                         FACILITY:  [facility]   PHYSICIAN:  Terrial Rhodes, M.D.DATE OF BIRTH:  [birth year]   DATE OF CONSULTATION:  03/20/2009  DATE OF DISCHARGE:                                 CONSULTATION   CONSULTING PHYSICIAN:  [facility].   REASON FOR CONSULTATION:  Acute renal failure.   HISTORY OF PRESENT ILLNESS:  Marcia [name] is a 60 year old white female  with past medical history significant for hypertension, obesity, and  hyperlipidemia who presented to [facility] with a 1-week history of  mid back pain.  She reports the pain started on last Tuesday as in the  middle of her back, radiated down her spine, but did not go into her  legs.  She started taking ibuprofen at home approximately 4 last week.  She also has been taking Benicar while she was taking the ibuprofen.  She continued to have some weakness, fatigue, and malaise.  She had some  nausea, but no vomiting and presented to [facility] for further  evaluation.  She was apparently seen by her primary care physician on  the day of admission and was noted to have an elevated potassium and  creatinine and sent to the ED for admission.  Her potassium on admission  was 5.5, BUN is 70, creatinine was 10.  We were asked to see the patient  for further evaluation of her acute renal failure.  She denies any  diarrhea, hematochezia, melena, or bright red blood per rectum.  No  family history of kidney disease.  She has a personal history of kidney  disease.  According to records, her serum creatinine was normal a month  ago, but I do not have this available at this time.   ALLERGIES:  She has no known drug allergies.   PAST MEDICAL HISTORY:  1. Hypertension.  2. Obesity.  3. Hyperlipidemia.  4. Status post appendectomy.  5. Status post bladder sling.  6. Partial  hysterectomy.   OUTPATIENT MEDICATIONS:  1. Benicar 20 mg a day.  2. Crestor 10 mg a day.  3. Ibuprofen p.r.n.  4. She also takes over-the-counter vitamin D and over-the-counter      niacin.   FAMILY HISTORY:  Father died at age 41 from COPD.  Mother is alive at  age 66.  She has dementia and hypertension.  No family history of kidney  disease.   SOCIAL HISTORY:  She is married, lives in [place] with her husband.  She has a Museum/gallery conservator and two sons, all in good health.  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  She denies tobacco or alcohol or drug  use.   REVIEW OF SYSTEMS:  GENERAL:  She had some weakness, malaise, and  fatigue prior to admission.  Denies any fevers or shaking chills.  CARDIAC:  She had some substernal heaviness last week, but it has since  resolved.  Denies dyspnea on exertion or exertional chest pain.  PULMONARY:  No shortness of breath,  hemoptysis, or productive cough.  GI:  He has had nausea, but no vomiting, hematochezia, melena, or bright  red blood per rectum.  GU:  No dysuria, pyuria, hematuria, urgency,  frequency, or retention.  RHEUMATOLOGIC:  No arthralgias or myalgias.  DERMATOLOGIC:  No rashes, lumps, or bumps.  HEMATOLOGIC:  No abnormal  bleeding or bruising.  MUSCULOSKELETAL:  She was complaining of back  pain, mid spine, radiating down her spine, but not her legs.  All other  systems are negative.   PHYSICAL EXAMINATION:  GENERAL:  A well-developed obese female in no  apparent distress.  VITAL SIGNS:  Temperature is 98.3, pulse 56, blood pressure 161/93,  respiratory rate 20.  HEENT:  Head normocephalic and atraumatic.  Pupils equal, round, and  reactive to light.  Extraocular muscles intact.  No icterus.  Oropharynx  without lesions.  NECK:  Supple.  No lymphadenopathy or bruits.  LUNGS:  Clear to auscultation and percussion bilaterally.  No rales or  rhonchi.  CARDIAC:  Regular rate and rhythm.  No precordial rub appreciated.   ABDOMEN:  Normoactive bowel sounds, soft, nontender, and nondistended.  No guarding, rebound, or bruits.  EXTREMITIES:  She has minimal pretibial edema, 1+ pedal pulses.  No  embolic changes.   LABORATORY DATA:  Sodium is 139, potassium 5, chloride 110, CO2 of 19,  BUN 63, creatinine 9.59, glucose 96, calcium 8.2.  Urine sodium 77.  Urinalysis showed protein 100 mg/dL, trace blood, 3-6 white cells, 3-6  red cells.  Ultrasound showed no hydronephrosis.  Her right kidney was  13.6 cm, left kidney 13.3.  Chest x-ray was negative.   ASSESSMENT AND PLAN:  1. Acute renal failure.  This is possibly due to ATN versus papillary      necrosis.  Given her history of nonsteroidals and ARB, also need to      rule out multiple myeloma given her back pain and anemia with acute      renal failure.  Also on differential would be an acute      glomerulonephritis though less likely given her lack of symptoms,      but we will recheck her urinalysis for rbc or wbc casts.  I will      also check serologies and continue to follow her renal function,      which are trending towards improvement.  A urine output is also      improved, continue with IV fluids for now.  2. Hypertension.  Continue to hold Benicar.  Agreed with clonidine and      Plendil, would change her IV fluids to half-normal saline and      decrease to rate and continue to follow.  3. Anemia.  Guaiac stools, check an SPEP and UPEP as well as iron      stores.  4. Hyperkalemia secondary to acute renal failure improving, continue      with supportive care.  5. Hypercholesterolemia on Crestor.  We will check a CPK to rule out      rhabdos as a possible contributor to her acute renal failure and      follow her LFTs.  No emergent indication for dialysis at this time.      We will continue to follow.   Thank you for this consultation.           ______________________________  Terrial Rhodes, M.D.     JC/MEDQ  D:  03/20/2009  T:   03/21/2009  Job:  [id number]

## 2011-04-08 NOTE — H&P (Signed)
NAMEJOCI, DRESS             ACCOUNT NO.:  [id number]   MEDICAL RECORD NO.:  [id number]          PATIENT TYPE:  INP   LOCATION:  6714                         FACILITY:  [facility]   PHYSICIAN:  Renee Ramus, MD       DATE OF BIRTH:  1951/02/12   DATE OF ADMISSION:  03/19/2009  DATE OF DISCHARGE:                              HISTORY & PHYSICAL   PRIMARY CARE PHYSICIAN:  Ralene Ok, MD   HISTORY OF PRESENT ILLNESS:  The patient is a 60 year old female who  recently had complaints of the lower back pain.  The patient has  previous history of degenerative joint disease and had been taking  ibuprofen intermittently for this.  The patient has been complaining of  back pain x1 week prior to admission after some yard work.  She rated  her pain as 2/10 and mild.  The patient denies fevers, chills, night  sweats, nausea, vomiting, chest pain, shortness of breath, PND, or  orthopnea.  The patient went to her physician.  He drew some labs and  told her your kidney enzymes were up and sent her to the hospital for  further evaluation and treatment.  In the emergency department, the  patient was found to have a BUN 70, creatinine of 10. This was in the  context of her previous BUN and creatinine which were normal; creatinine  being 1.0, done approximately 1 month prior to admission.  The patient  is now being admitted for further evaluation and treatment of acute  renal failure.   PAST MEDICAL HISTORY:  1. Hypertension.  2. Hypercholesterolemia.  3. Obesity.   SOCIAL HISTORY:  The patient has no tobacco or alcohol history.  She  lives at home.  She is married.   FAMILY HISTORY:  Not available.   REVIEW OF SYSTEMS:  All other comprehensive review systems are negative.   MEDICATIONS:  The patient has no known drug allergies.   CURRENT MEDICATIONS:  1. Benicar 20 mg p.o. daily.  2. Crestor 10 mg p.o. daily.  3. Ibuprofen 200 mg p.o. q.4 h. p.r.n. pain which the patient says she      was  taking approximately 1-2 of these every 4 hours for the past 3-      4 days prior to admission.   PHYSICAL EXAMINATION:  GENERAL:  This is a morbidly obese white female  currently in no apparent distress.  VITAL SIGNS:  Blood pressure 159/70, heart rate 56, respiratory rate 20,  temperature 97.9.  HEENT:  No jugular venous distention or lymphadenopathy.  Oropharynx is  clear.  Mucous membranes are pink and moist.  TMs are clear bilaterally.  Pupils are equal and reactive to light and accommodation.  Extraocular  muscles appear to be intact.  CARDIOVASCULAR:  She has a regular rate and rhythm without murmurs,  rubs, or gallops.  PULMONARY:  Lungs are clear to auscultation bilaterally.  ABDOMEN:  Obese, nontender, nondistended without hepatosplenomegaly.  Bowel sounds are present.  She has no rebound or guarding.  EXTREMITIES:  She has no clubbing, cyanosis, or edema.  She has good  peripheral  pulses in dorsalis pedis and radial arteries.  She is able to  move all extremities.  NEUROLOGIC:  Cranial nerves II through XII are grossly intact.  She has  no focal neurological deficits.   IMAGING STUDIES:  1. Lumbar spine film shows no acute disease.  She does have evidence      of chronic degenerative joint disease at L4-5.  2. Chest x-ray shows no acute disease.   LABORATORY DATA:  Sodium 138, potassium 5.5, chloride 108, bicarb 20,  BUN 70, creatinine 10, glucose 111.  UA shows specific gravity of 1.011  with trace blood, evidence of protein and bacteria, but no evidence of  acute infection.   ASSESSMENT AND PLAN:  1. Acute renal failure.  We will calculate a FENa.  We will also      obtain renal ultrasound to look for signs of obstruction.  We will      obtain UPEP, SPEP and give the patient gentle IV fluids to see how      she how responds.  We will hold her ibuprofen and Benicar and      reevaluate her BUN and creatinine.  I am hopeful this is medication      related and easily  reversed; however, there is always the      possibility of obstruction and the most likely cause would be from      a solid tumor such as lymphoma or possibility of multiple myeloma.  2. Hypertension.  Obviously, the patient cannot tolerate ACE      inhibitor, so we will hold that.  Her low pulse rate would not      tolerate beta-blockers, so we will treat initially with Norvasc and      consider clonidine if she remains refractory.  3. Hypercholesterolemia.  Check lipid panel and continue statin      therapy.  4. Obesity.  We will check TSH and free T4.   DISPOSITION:  The patient is a full code.   H&P was constructed by reviewing past medical history, conferring with  emergency medical room physician, reviewing the emergency medical  record.  Time spent, 1 hour.      Renee Ramus, MD  Electronically Signed     JF/MEDQ  D:  03/20/2009  T:  03/20/2009  Job:  [id number]   cc:   Ralene Ok, M.D.

## 2011-04-08 NOTE — Discharge Summary (Signed)
Marcia [name], LEQUIRE             ACCOUNT NO.:  [id number]   MEDICAL RECORD NO.:  [id number]          PATIENT TYPE:  INP   LOCATION:  6714                         FACILITY:  [facility]   PHYSICIAN:  Beckey Rutter, MD  DATE OF BIRTH:  04/16/1951   DATE OF ADMISSION:  03/19/2009  DATE OF DISCHARGE:                               DISCHARGE SUMMARY   PRIMARY CARE PHYSICIAN:  Ralene Ok, MD   CHIEF COMPLAINT:  1. This is a 60 years old female who presented with lower back pain.      The patient was found to have elevated creatinine with acute renal      failure.  The patient's creatinine has improved slowly from 10 on      the day of admission to 6.62 today.  The etiology of renal failure      is felt unclear, but the patient underwent bone marrow aspiration      today and she would continue to follow up with Nephrology for      further workup.  The patient has good urine flow and she was      cleared by Nephrology for discharge.  2. Anemia.  This is also felt multifactorial, could be secondary to      the renal failure, although malignancy such as multiple myeloma      could not be ruled out entirely.  The patient had bone marrow      biopsy today and she would follow up with Dr. Myna Hidalgo in the [facility] after discharge.  3. Hypertension.  Blood pressure is acceptable.  I will discharge the      patient without Benicar.  4. E. coli urinary tract infection.  The patient received      ciprofloxacin for 3 days.  I will discharge the patient home      without antibiotic.   HOSPITAL PROCEDURES:  The patient had posterior iliac bone biopsy today  with aspiration and core biopsy.  The pathology report is still pending.   Her lab tests today showing creatinine of 6.7, BUN 62, sodium 141,  potassium 5.3, white blood count is 6.9, hemoglobin is 10.3, platelet  count is 194, hematocrit is 29.4.  Urine culture is growing more than  100,000 colonies of Escherichia coli sensitive to  Cipro.   PROCEDURES/IMAGING:  1. A chest x-ray on March 19, 2009, impression is scattered      subsegmental atelectasis and/or scarring.  No acute finding.  2. Ultrasound to the kidneys on March 20, 2009, impression is normal      study.  3. On March 23, 2009, the patient had bone scan survey, the impression      is showing negative metastatic bone survey for metastatic bone      disease.  4. On May 4, the patient underwent CT-guided bone marrow biopsy of the      posterior right iliac bone.   DISCHARGE DIAGNOSES:  1. Acute renal failure, etiology is not clear.  2. Anemia.  3. Rule out multiple myeloma, work is in progress.  4. Hypertension.  5. Obesity.   DISCHARGE MEDICATIONS:  1. Lasix 40 mg daily.  2. Plendil 2.5 mg daily.  3. Tylenol p.r.n.   DISCHARGE PLAN:  The patient was discharged today to follow up with Dr.  Ralene Ok within 1 week.  She should follow up with [facility] within this week as discussed with her.  The patient should  [facility]  She is aware and  agreeable to discharge plan.  Prescription for Lasix and Plendil was  given.      Beckey Rutter, MD  Electronically Signed     EME/MEDQ  D:  03/27/2009  T:  03/28/2009  Job:  [id number]   cc:   Ralene Ok, M.D.

## 2011-04-08 NOTE — Consult Note (Signed)
NAMEEARLYNE, FEESER             ACCOUNT NO.:  [id number]   MEDICAL RECORD NO.:  [id number]          PATIENT TYPE:  INP   LOCATION:  6714                         FACILITY:  [facility]   PHYSICIAN:  Rose Phi. Myna Hidalgo, M.D. DATE OF BIRTH:  10-05-1951   DATE OF CONSULTATION:  03/23/2009  DATE OF DISCHARGE:                                 CONSULTATION   REFERRING PHYSICIAN:  Zenovia Jordan, PA   DIAGNOSIS:  1. 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  2. Acute renal failure.   HISTORY OF PRESENT ILLNESS:  Ms. Gillock is a very charming 60 year old  white female who has been in previous good health.  She has high blood  pressure and hyperlipidemia.  She lives in [place].  She works in  [employer].   She basically presented with midback pain a week ago.  This is through  her primary doctor.  Her primary doctor is Ralene Ok, M.D.   Of note, she had x-rays done of her lumbar spine back in March 2009.  Everything looked okay.  At that point, there were some degenerative  changes.   She apparently was found to be anemic and in renal failure.  She  subsequently was told to go to [facility] for admission.   She was admitted on the 27th.  She had lab work done.  Her BUN and  creatinine were 70 and 10.0.  Sodium was 139, potassium 5.5.  Calcium is  8.9.  Her CBC at that time shows white cell count of 5.7, hemoglobin 10,  hematocrit 29, platelet count 186.  MCV was 96.   She has TSH, which was normal at 3.7.   She had creatinine kinase, which was normal.   She was followed and previously seen by [facility].  She had routine  renal studies for renal failure.  She had ferritin of 325.  She has a C3  of 149 and C4 of 41.  An anemia panel was done, which showed a markedly  depressed retic count of 0.9.  B12 was 278.  Folate was 116.5.   She had stool for occult blood.  This is all negative.   She did have a SPEP.  She had a monoclonal spike of 0.2 g/dL.  This was  not further identified.   A 24-hour urine was done.  She had 2500 cc of  urine.  She was found to have marked increase in kappa light chain  excretion.  Urine immunofixation showed a monoclonal kappa light chains.  She had total of 1480 mg/dL.  Total kappa light chain excretion for the  day was 37 g.   She has had a bone survey, which was negative.  She had immunoglobulin  levels done, which were markedly depressed.  IgA was 14 IgG was 403, and  IgM was 9.   Again, we were asked to see her for our input.   She feels pretty well.  She is eating okay.  She never had no nausea,  vomiting.  She is urinating okay.  She is having some loose stools.  There is no leg swelling.  She  did get some Kayexalate for her  potassium.   There has been no headache.  There has been no double vision or blurred  vision.  She has had no frank chest discomfort.  There has been some  shortness of breath and fatigue with exertion.   Her overall performance status is ECOG I.   PAST MEDICAL HISTORY:  1. Hypertension.  2. Hyperlipidemia.  3. Obesity.  4. Degenerative low back disease.  5. Partial hysterectomy.  6. Appendectomy.  7. Bladder sling.   Her allergies were to Biaxin.   ADMISSION MEDICATIONS:  1. Plendil 2.5 mg daily.  2. Crestor 10 mg daily.  3. Catapres 0.1 mg p.o. b.i.d.  4. Benicar 20 mg daily.  5. Roxicodone as needed.   SOCIAL HISTORY:  Negative for tobacco use.  There is no alcohol use.  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   FAMILY HISTORY:  Noncontributory.   REVIEW OF SYSTEMS:  As stated in history of present illness.   PHYSICAL EXAMINATION:  VITAL SIGNS:  This is a mildly obese white female  in no obvious distress.  VITAL SIGNS:  97.9, pulse 62, respiratory rate 18, blood pressure  157/93.  Her weight is 112 kg.  HEENT:  She has normocephalic, atraumatic skull.  There are no ocular or  oral lesions.  There were no palpable, cervical or supraclavicular lymph  nodes.  LUNGS:  Decreased at the  bases.  There are no rales, wheezes, or  rhonchi.  CARDIAC:  Regular rate and rhythm with normal S1 and S2.  There are no  murmurs, rubs, or bruits.  ABDOMEN:  Soft with good bowel sounds.  There is no palpable abdominal  mass.  There is no fluid wave.  No palpable hepatosplenomegaly.  BACK:  Slight tenderness to palpation over the lower thoracic upper  lumbar spine.  No spasms noted in the back.  EXTREMITIES:  No clubbing, cyanosis, or edema.  She has good range of  motion of her joints.  SKIN:  No rashes, ecchymosis, or petechiae.  NEUROLOGICAL:  She has no focal neurological deficits.   IMPRESSION:  Ms. Prunty is a 60 year old white female who clearly, in  my mind, has kappa light chain myeloma.  She has 37 g of kappa light  chain in her urine.  She has markedly suppressed serum immunoglobulins.  I am sure her serum kappa light chain will be sky high.   Thankfully, her bone survey looks okay without any evidence of  pathologic fracture or compression fractures.   Ms. Gunnarson is going to have a bone marrow test done.  This will confirm  the diagnosis for [place].  Ms. Mcquinn would like to have confirmation by a  bone marrow test.  We will have to get Radiology to set this up for [place]  earlier next week.   Once we establish the diagnosis of myeloma, she clearly is going to need  chemotherapy.  Given her renal insufficiency, we will be limited as to  what we can use.  We really cannot use Revlimid as this is renally  excreted.  I would consider using Velcade and Doxil.  I also would  probably include low-dose Decadron (CZD regimen).   Because of the renal insufficiency it would be hard to use Aredia or  Zometa.  Thankfully, she does not have obvious bone mets, so will be  okay with this.   Ultimately, I would think that she would probably need a stem cell  transplant.  With the bone marrow biopsy and aspirate, we will also send a cytometry  and cytogenetics.  This certainly would  not surprise me to know if she  has adverse cytogenetics.   I spent a good time with Ms. Bilyeu and her husband.  They both were  very nice.  We certainly try to move along  as fast as we can.   I do not see need for any additional testing right now.  She has to wait  until the bone marrow test to be done.  Hopefully, this will be done on  Monday.   We will certainly followup with Ms. Strohmeier as an outpatient.  When she  has a bone marrow test done, then she should be able to go home and be  followed up as an outpatient.  Hopefully, her renal function will  continue to improve and she will not require dialysis.   She probably will require Aranesp with respect to her anemia given her  renal insufficiency.      Rose Phi. Myna Hidalgo, M.D.  Electronically Signed     PRE/MEDQ  D:  03/23/2009  T:  03/24/2009  Job:  [id number]   cc:   Zenovia Jordan, P.A.

## 2011-04-11 NOTE — Op Note (Signed)
NAMEROCKIE, VAWTER             ACCOUNT NO.:  [id number]   MEDICAL RECORD NO.:  [id number]          PATIENT TYPE:  OBV   LOCATION:  9399                          FACILITY:  [facility]   PHYSICIAN:  Carrington Clamp, M.D. DATE OF BIRTH:  02-26-1951   DATE OF PROCEDURE:  DATE OF DISCHARGE:                                 OPERATIVE REPORT   Audio too short to transcribe (less than 5 seconds)       MH/MEDQ  D:  06/19/2005  T:  06/19/2005  Job:  [id number]

## 2011-07-31 ENCOUNTER — Other Ambulatory Visit: Payer: Self-pay | Admitting: Hematology & Oncology

## 2011-07-31 ENCOUNTER — Encounter (HOSPITAL_BASED_OUTPATIENT_CLINIC_OR_DEPARTMENT_OTHER): Payer: Managed Care, Other (non HMO) | Admitting: Hematology & Oncology

## 2011-07-31 DIAGNOSIS — C9001 Multiple myeloma in remission: Secondary | ICD-10-CM

## 2011-07-31 DIAGNOSIS — C9000 Multiple myeloma not having achieved remission: Secondary | ICD-10-CM

## 2011-07-31 LAB — CBC WITH DIFFERENTIAL (CANCER CENTER ONLY)
BASO#: 0 10*3/uL (ref 0.0–0.2)
BASO%: 0.2 % (ref 0.0–2.0)
Eosinophils Absolute: 0.1 10*3/uL (ref 0.0–0.5)
HCT: 40.7 % (ref 34.8–46.6)
HGB: 14.1 g/dL (ref 11.6–15.9)
LYMPH%: 53.6 % — ABNORMAL HIGH (ref 14.0–48.0)
MCV: 94 fL (ref 81–101)
MONO#: 0.3 10*3/uL (ref 0.1–0.9)
NEUT%: 36 % — ABNORMAL LOW (ref 39.6–80.0)
RDW: 14 % (ref 11.1–15.7)
WBC: 4.3 10*3/uL (ref 3.9–10.0)

## 2011-08-04 ENCOUNTER — Other Ambulatory Visit: Payer: Self-pay | Admitting: Hematology & Oncology

## 2011-08-04 LAB — COMPREHENSIVE METABOLIC PANEL
ALT: 48 U/L — ABNORMAL HIGH (ref 0–35)
AST: 36 U/L (ref 0–37)
Albumin: 4 g/dL (ref 3.5–5.2)
CO2: 24 mEq/L (ref 19–32)
Calcium: 8.3 mg/dL — ABNORMAL LOW (ref 8.4–10.5)
Chloride: 107 mEq/L (ref 96–112)
Creatinine, Ser: 1.72 mg/dL — ABNORMAL HIGH (ref 0.50–1.10)
Potassium: 3.9 mEq/L (ref 3.5–5.3)
Sodium: 140 mEq/L (ref 135–145)
Total Protein: 6.7 g/dL (ref 6.0–8.3)

## 2011-08-04 LAB — LACTATE DEHYDROGENASE: LDH: 101 U/L (ref 94–250)

## 2011-08-04 LAB — PROTEIN ELECTROPHORESIS, SERUM
Beta 2: 4.9 % (ref 3.2–6.5)
Gamma Globulin: 17 % (ref 11.1–18.8)

## 2011-08-06 LAB — UIFE/LIGHT CHAINS/TP QN, 24-HR UR
Albumin, U: DETECTED
Alpha 2, Urine: DETECTED — AB
Beta, Urine: DETECTED — AB
Free Lambda Lt Chains,Ur: 1.21 mg/dL — ABNORMAL HIGH (ref 0.02–0.67)
Free Lt Chn Excr Rate: 144.1 mg/d
Gamma Globulin, Urine: DETECTED — AB
Volume, Urine: 1100 mL

## 2011-12-01 ENCOUNTER — Other Ambulatory Visit: Payer: Self-pay | Admitting: Hematology & Oncology

## 2011-12-01 ENCOUNTER — Other Ambulatory Visit (HOSPITAL_BASED_OUTPATIENT_CLINIC_OR_DEPARTMENT_OTHER): Payer: Managed Care, Other (non HMO) | Admitting: Lab

## 2011-12-01 ENCOUNTER — Ambulatory Visit (HOSPITAL_BASED_OUTPATIENT_CLINIC_OR_DEPARTMENT_OTHER): Payer: Managed Care, Other (non HMO) | Admitting: Hematology & Oncology

## 2011-12-01 DIAGNOSIS — C9000 Multiple myeloma not having achieved remission: Secondary | ICD-10-CM

## 2011-12-01 LAB — CBC WITH DIFFERENTIAL (CANCER CENTER ONLY)
BASO#: 0 10*3/uL (ref 0.0–0.2)
BASO%: 0.2 % (ref 0.0–2.0)
HCT: 41 % (ref 34.8–46.6)
HGB: 13.6 g/dL (ref 11.6–15.9)
LYMPH#: 2.1 10*3/uL (ref 0.9–3.3)
LYMPH%: 48.5 % — ABNORMAL HIGH (ref 14.0–48.0)
MCV: 98 fL (ref 81–101)
MONO#: 0.3 10*3/uL (ref 0.1–0.9)
NEUT%: 43.7 % (ref 39.6–80.0)
RDW: 14 % (ref 11.1–15.7)
WBC: 4.3 10*3/uL (ref 3.9–10.0)

## 2011-12-01 LAB — COMPREHENSIVE METABOLIC PANEL
ALT: 23 U/L (ref 0–35)
AST: 21 U/L (ref 0–37)
Albumin: 4 g/dL (ref 3.5–5.2)
Alkaline Phosphatase: 74 U/L (ref 39–117)
Calcium: 9 mg/dL (ref 8.4–10.5)
Chloride: 106 mEq/L (ref 96–112)
Creatinine, Ser: 1.58 mg/dL — ABNORMAL HIGH (ref 0.50–1.10)
Potassium: 3.9 mEq/L (ref 3.5–5.3)

## 2011-12-01 NOTE — Progress Notes (Signed)
CC:   Terrial Rhodes, M.D. Ralene Ok, M.D. Verita Schneiders, MD  DIAGNOSIS:  Kappa light chain myeloma-clinical remission.  CURRENT THERAPY:  Revlimid 5 mg p.o. every other day (21 days on/7 days off.  INTERVAL HISTORY:  Marcia [name] comes in for followup.  She had the "flu" over the holidays.  She got through this period. Her husband also had the same issue.  She stopped her Revlimid for a little bit.  She is not yet back on it.  She has been on Revlimid since I think May of 2011.  She has had no fevers, sweats, or chills.  She has had no rashes.  She has had no change in bowel or bladder habits.  We last saw her, her kappa light chain was 4.4, 6 mg/dL.  There was no monoclonal spike in her serum.  PHYSICAL EXAM:  General: This is a well-developed, well-nourished white female in no obvious distress.  Vital Signs: Show a temperature 97.8, pulse 74, heart rate 18, blood pressure 109/74, and weight is 201.  Head and neck exam shows a normocephalic, atraumatic skull.  There are no ocular or oral lesions.  There are no palpable cervical or supraclavicular lymph nodes.  Lungs are clear bilaterally.  Cardiac exam: Regular rate and rhythm with a normal S1 and S2.  There are no murmurs, rubs, or bruits.  Abdominal Exam:  Soft with good bowel sounds. There is no palpable abdominal mass.  There is no palpable hepatosplenomegaly.  Skin exam: Shows no rashes, ecchymosis, or petechia.  LABORATORY RESULTS:  White count 4.3, hemoglobin 13.6, hematocrit 41, and platelet count 164.  IMPRESSION:  Marcia [name] is a 61 year old white female with kappa light chain myeloma.  She has done very nicely.  We will go ahead and plan to get her back to see [place] in another 3 months. She sees Dr. Jacqulyn Bath at [facility] at the end of this month.    ______________________________ Josph Macho, M.D. PRE/MEDQ  D:  12/01/2011  T:  12/01/2011  Job:  [id number]   ADDENDUM: (-) M-spike. KAPPA light chain is 3.23 mg/dL.

## 2011-12-01 NOTE — Progress Notes (Signed)
This office note has been dictated.

## 2011-12-02 LAB — KAPPA/LAMBDA LIGHT CHAINS
Kappa free light chain: 3.25 mg/dL — ABNORMAL HIGH (ref 0.33–1.94)
Kappa:Lambda Ratio: 1.12 (ref 0.26–1.65)
Lambda Free Lght Chn: 2.91 mg/dL — ABNORMAL HIGH (ref 0.57–2.63)

## 2011-12-02 LAB — COMPREHENSIVE METABOLIC PANEL
AST: 21 U/L (ref 0–37)
Albumin: 4 g/dL (ref 3.5–5.2)
BUN: 28 mg/dL — ABNORMAL HIGH (ref 6–23)
Calcium: 9 mg/dL (ref 8.4–10.5)
Chloride: 106 mEq/L (ref 96–112)
Creatinine, Ser: 1.58 mg/dL — ABNORMAL HIGH (ref 0.50–1.10)
Glucose, Bld: 93 mg/dL (ref 70–99)
Potassium: 3.9 mEq/L (ref 3.5–5.3)

## 2011-12-03 LAB — COMPREHENSIVE METABOLIC PANEL
ALT: 23 U/L (ref 0–35)
ALT: 23 U/L (ref 0–35)
AST: 21 U/L (ref 0–37)
AST: 21 U/L (ref 0–37)
Albumin: 4 g/dL (ref 3.5–5.2)
Alkaline Phosphatase: 74 U/L (ref 39–117)
BUN: 28 mg/dL — ABNORMAL HIGH (ref 6–23)
CO2: 21 mEq/L (ref 19–32)
Calcium: 9 mg/dL (ref 8.4–10.5)
Chloride: 106 mEq/L (ref 96–112)
Chloride: 106 mEq/L (ref 96–112)
Creatinine, Ser: 1.58 mg/dL — ABNORMAL HIGH (ref 0.50–1.10)
Creatinine, Ser: 1.58 mg/dL — ABNORMAL HIGH (ref 0.50–1.10)
Potassium: 3.9 mEq/L (ref 3.5–5.3)
Sodium: 140 mEq/L (ref 135–145)
Total Bilirubin: 0.6 mg/dL (ref 0.3–1.2)
Total Protein: 6.6 g/dL (ref 6.0–8.3)

## 2011-12-03 LAB — KAPPA/LAMBDA LIGHT CHAINS
Kappa:Lambda Ratio: 1.12 (ref 0.26–1.65)
Kappa:Lambda Ratio: 1.12 (ref 0.26–1.65)
Lambda Free Lght Chn: 2.91 mg/dL — ABNORMAL HIGH (ref 0.57–2.63)

## 2011-12-03 LAB — LACTATE DEHYDROGENASE: LDH: 97 U/L (ref 94–250)

## 2011-12-04 LAB — SPEP & IFE WITH QIG
Albumin ELP: 59.1 % (ref 55.8–66.1)
Beta 2: 4.4 % (ref 3.2–6.5)
IgA: 127 mg/dL (ref 69–380)
IgM, Serum: 69 mg/dL (ref 52–322)
Total Protein, Serum Electrophoresis: 6.6 g/dL (ref 6.0–8.3)

## 2011-12-09 ENCOUNTER — Other Ambulatory Visit: Payer: Self-pay | Admitting: *Deleted

## 2011-12-09 MED ORDER — LENALIDOMIDE 5 MG PO CAPS: 5.0000 mg | ORAL_CAPSULE | ORAL | Status: DC

## 2011-12-09 NOTE — Telephone Encounter (Signed)
Received a call from the pt stating that her Revlimid rx needed to be faxed to [phone]. Faxed rx to the # listed after obtaining an auth from [employer].

## 2012-01-20 ENCOUNTER — Other Ambulatory Visit: Payer: Self-pay | Admitting: *Deleted

## 2012-01-20 MED ORDER — LENALIDOMIDE 5 MG PO CAPS: 5.0000 mg | ORAL_CAPSULE | ORAL | Status: DC

## 2012-01-20 NOTE — Telephone Encounter (Signed)
Received refill request for Revlimid 5 mg. Obtained authorization # [id number].

## 2012-02-16 ENCOUNTER — Other Ambulatory Visit: Payer: Self-pay | Admitting: *Deleted

## 2012-02-16 MED ORDER — LENALIDOMIDE 5 MG PO CAPS: 5.0000 mg | ORAL_CAPSULE | ORAL | Status: DC

## 2012-02-16 NOTE — Telephone Encounter (Signed)
Received a call from [facility] stating the pt placed a call in to let them know she needed a Revlimid refill. Asked if a fax had been sent and she stated "we don't send refill/reminder faxes". Then asked how the office is to know if the pt is in the need of a refill and she stated, "we will call the office once the pt calls [place]". Let her know that a new rx will be sent in today.

## 2012-02-23 ENCOUNTER — Other Ambulatory Visit (HOSPITAL_BASED_OUTPATIENT_CLINIC_OR_DEPARTMENT_OTHER): Payer: Managed Care, Other (non HMO) | Admitting: Lab

## 2012-02-23 ENCOUNTER — Ambulatory Visit (HOSPITAL_BASED_OUTPATIENT_CLINIC_OR_DEPARTMENT_OTHER): Payer: Managed Care, Other (non HMO) | Admitting: Hematology & Oncology

## 2012-02-23 ENCOUNTER — Ambulatory Visit (HOSPITAL_BASED_OUTPATIENT_CLINIC_OR_DEPARTMENT_OTHER)
Admission: RE | Admit: 2012-02-23 | Discharge: 2012-02-23 | Disposition: A | Discharge status: Home / Self Care | Payer: Managed Care, Other (non HMO) | Source: Ambulatory Visit | Attending: Hematology & Oncology | Admitting: Hematology & Oncology

## 2012-02-23 VITALS — BP 123/82 | HR 89 | Temp 97.8°F | Wt 200.0 lb

## 2012-02-23 DIAGNOSIS — C9000 Multiple myeloma not having achieved remission: Secondary | ICD-10-CM

## 2012-02-23 DIAGNOSIS — Z0389 Encounter for observation for other suspected diseases and conditions ruled out: Secondary | ICD-10-CM

## 2012-02-23 DIAGNOSIS — M549 Dorsalgia, unspecified: Secondary | ICD-10-CM

## 2012-02-23 LAB — CBC WITH DIFFERENTIAL (CANCER CENTER ONLY)
BASO#: 0 10*3/uL (ref 0.0–0.2)
BASO%: 0.8 % (ref 0.0–2.0)
Eosinophils Absolute: 0.1 10*3/uL (ref 0.0–0.5)
HGB: 14.2 g/dL (ref 11.6–15.9)
LYMPH%: 51 % — ABNORMAL HIGH (ref 14.0–48.0)
MCV: 97 fL (ref 81–101)
MONO#: 0.4 10*3/uL (ref 0.1–0.9)
NEUT#: 1.9 10*3/uL (ref 1.5–6.5)
Platelets: 205 10*3/uL (ref 145–400)
RBC: 4.36 10*6/uL (ref 3.70–5.32)
RDW: 13.8 % (ref 11.1–15.7)
WBC: 5 10*3/uL (ref 3.9–10.0)

## 2012-02-23 NOTE — Progress Notes (Signed)
CC:   Marcia [name], M.D. Marcia [name], M.D. Marcia Schneiders, MD  DIAGNOSIS:  Kappa light chain myeloma, clinical remission.  CURRENT THERAPY:  Revlimid 5 mg p.o. every other day (21 days on and 7 days off).  INTERIM HISTORY:  Marcia [name] comes in for followup.  She came in from [place] last night.  She was at her mom's for 3 weeks.  Her mom was having health issues.  Now, it sounds like she is doing better.  Marcia [name] did not have any problems while she is out in [place]. She is complaining of some back discomfort.  This may be from flying. However, I do think that we should probably get a bone survey on her as this probably would be warranted.  We will go ahead and get that today.  Her last bone survey was back in 2010.  She still sees Dr. Jacqulyn Bath at [facility].  It sounds like he does not need to see her but yearly now.  Her kidney function seems to be doing okay.  She sees Dr. Arrie Aran. She has not had any worsening of kidney function.  PHYSICAL EXAM:  General:  This is a very tan white female in no obvious distress.  Vital Signs:  Show temperature 97.8, pulse 89, respiratory rate 18, blood pressure 123/82.  Weight is 200 pounds.  Head and neck: Shows a normocephalic, atraumatic skull.  There are no ocular or oral lesions.  There are no palpable cervical or supraclavicular lymph nodes. Lungs:  Clear bilaterally.  Cardiac:  Regular rate and rhythm with a normal S1 and S2.  There are no murmurs, rubs or bruits.  Abdomen: Soft, mildly obese.  She has good bowel sounds.  There is no fluid wave. There is no palpable hepatosplenomegaly.  Extremities:  Shows no clubbing, cyanosis or edema.  She has good range of motion of her joints.  Neurological:  No focal neurological deficits.  LABORATORY STUDIES:  White cell count 5, hemoglobin 14.2, hematocrit 42.3, platelet count 205.  IMPRESSION:  Marcia [name] is a 61 year old white female with history of kappa light chain myeloma.  She  underwent systemic chemotherapy for this.  She got into remission nicely.  I think we just treated her with Revlimid and Velcade.  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 I think the key now is getting her off the Revlimid.  She had been on Revlimid since May 2011.  She will finish up her 2 years this May.  I think we can probably get her off.  I will plan to see her back in June.  We will get a bone survey on her just to make sure that everything has not changed.    ______________________________ Josph Macho, M.D. PRE/MEDQ  D:  02/23/2012  T:  02/23/2012  Job:  [id number]  ADDENDUM:  KAPPA is 4.21 mg/dL.  (-) M-spike.  IgG is 1320 mg/dL.

## 2012-02-23 NOTE — Progress Notes (Signed)
This office note has been dictated.

## 2012-02-24 ENCOUNTER — Telehealth: Payer: Self-pay | Admitting: *Deleted

## 2012-02-24 NOTE — Telephone Encounter (Addendum)
Message copied by Mirian Capuchin on Tue Feb 24, 2012  3:03 PM ------      Message from: Arlan Organ R      Created: Mon Feb 23, 2012  6:47 PM       Please call and tell her that her bone survey does not show any obvious myeloma disease.  This message given to pt and she voiced understanding.

## 2012-02-25 LAB — PROTEIN ELECTROPHORESIS, SERUM, WITH REFLEX
Alpha-1-Globulin: 4.2 % (ref 2.9–4.9)
Alpha-2-Globulin: 11.6 % (ref 7.1–11.8)
Beta 2: 5.2 % (ref 3.2–6.5)
Beta Globulin: 5.8 % (ref 4.7–7.2)
Gamma Globulin: 17.1 % (ref 11.1–18.8)

## 2012-02-25 LAB — COMPREHENSIVE METABOLIC PANEL
Albumin: 4.2 g/dL (ref 3.5–5.2)
BUN: 22 mg/dL (ref 6–23)
CO2: 23 mEq/L (ref 19–32)
Calcium: 9.6 mg/dL (ref 8.4–10.5)
Glucose, Bld: 91 mg/dL (ref 70–99)
Potassium: 3.7 mEq/L (ref 3.5–5.3)
Sodium: 141 mEq/L (ref 135–145)
Total Protein: 7.4 g/dL (ref 6.0–8.3)

## 2012-02-25 LAB — IGG, IGA, IGM
IgA: 182 mg/dL (ref 69–380)
IgG (Immunoglobin G), Serum: 1320 mg/dL (ref 690–1700)

## 2012-03-01 ENCOUNTER — Telehealth: Payer: Self-pay | Admitting: *Deleted

## 2012-03-01 NOTE — Telephone Encounter (Addendum)
Message copied by Mirian Capuchin on Mon Mar 01, 2012  4:26 PM ------      Message from: Josph Macho      Created: Sun Feb 29, 2012  2:07 PM       Call -  No active myeloma.  Marcia [name]  Pt called and given this message.  Voiced understanding.

## 2012-04-29 ENCOUNTER — Ambulatory Visit (HOSPITAL_BASED_OUTPATIENT_CLINIC_OR_DEPARTMENT_OTHER): Payer: Managed Care, Other (non HMO) | Admitting: Hematology & Oncology

## 2012-04-29 ENCOUNTER — Other Ambulatory Visit (HOSPITAL_BASED_OUTPATIENT_CLINIC_OR_DEPARTMENT_OTHER): Payer: Managed Care, Other (non HMO) | Admitting: Lab

## 2012-04-29 VITALS — BP 128/84 | HR 69 | Temp 97.2°F | Ht 62.0 in | Wt 204.0 lb

## 2012-04-29 DIAGNOSIS — C9000 Multiple myeloma not having achieved remission: Secondary | ICD-10-CM

## 2012-04-29 LAB — CBC WITH DIFFERENTIAL (CANCER CENTER ONLY)
Eosinophils Absolute: 0.1 10*3/uL (ref 0.0–0.5)
HCT: 43.4 % (ref 34.8–46.6)
LYMPH%: 48.1 % — ABNORMAL HIGH (ref 14.0–48.0)
MCV: 98 fL (ref 81–101)
MONO#: 0.3 10*3/uL (ref 0.1–0.9)
NEUT%: 45.6 % (ref 39.6–80.0)
RBC: 4.42 10*6/uL (ref 3.70–5.32)
WBC: 5.5 10*3/uL (ref 3.9–10.0)

## 2012-04-29 NOTE — Progress Notes (Signed)
This office note has been dictated.

## 2012-04-30 NOTE — Progress Notes (Signed)
CC:   Terrial Rhodes, M.D. Ralene Ok, M.D. Verita Schneiders, MD  DIAGNOSIS:  Kappa light chain myeloma-clinical remission.  CURRENT THERAPY:  Observation.  INTERVAL HISTORY:  Ms. Montenegro comes in for followup.  She is doing well.  We last saw her back in April.  Since then, she has had no problems at all.  When we last saw her in April, there was no monoclonal spike in her serum.  Her serum kappa light chain was 4.21 mg/dL.  She has had no fever. There has been no bony pain.  She has had no change in bowel or bladder habits.  She has had no fevers.  She has had no headache.  There has been no dysphasia or odynophagia.  She says she is going to join the [employer] this summer and swim.  PHYSICAL EXAM:  General: This is a well-developed, well-nourished, white female who is somewhat obese.  Vital signs:  Show temperature of 97.2, pulse 69, respiratory rate 22, blood pressure 128/84.  Weight is 208. Head and neck:  Shows a normocephalic, atraumatic skull.  There are no ocular or oral lesions.  There are no palpable cervical or supraclavicular lymph nodes.  Lungs:  Clear bilaterally.  Cardiac: Regular rate rhythm with normal S1, S2.  There are no murmurs, rubs or bruits.  Abdomen:  Soft with good bowel sounds.  There is no palpable abdominal mass.  There is no palpable hepatosplenomegaly.  Back:  No tenderness of the spine, ribs, or hips.  Extremities:  Show no clubbing, cyanosis or edema.  LABORATORY STUDIES:  White cell count is 5.5, hemoglobin 14.5, hematocrit 43.2, platelet count is 187.  IMPRESSION:  Ms. Griffy is a 61 year old white female with kappa light chain myeloma.  She ultimately underwent a stem cell transplant at [facility]. She got this back in, I think, March of 2011.  She was on Revlemid for 2 years.  She completed this back in April.  We did go ahead and get a bone survey on her when we last saw her.  The bone survey did not show any evidence of lytic myeloma lesions.  We  will plan to get her back now in 4 months.  I think this would be appropriate for her.    ______________________________ Josph Macho, M.D. PRE/MEDQ  D:  04/29/2012  T:  04/30/2012  Job:  [id number]

## 2012-05-03 LAB — PROTEIN ELECTROPHORESIS, SERUM, WITH REFLEX
Alpha-1-Globulin: 3.7 % (ref 2.9–4.9)
Alpha-2-Globulin: 10.8 % (ref 7.1–11.8)
Gamma Globulin: 16.1 % (ref 11.1–18.8)
Total Protein, Serum Electrophoresis: 7.1 g/dL (ref 6.0–8.3)

## 2012-05-03 LAB — COMPREHENSIVE METABOLIC PANEL
Albumin: 4.2 g/dL (ref 3.5–5.2)
Alkaline Phosphatase: 65 U/L (ref 39–117)
Chloride: 104 mEq/L (ref 96–112)
Glucose, Bld: 87 mg/dL (ref 70–99)
Potassium: 4.4 mEq/L (ref 3.5–5.3)
Sodium: 139 mEq/L (ref 135–145)
Total Protein: 7.1 g/dL (ref 6.0–8.3)

## 2012-05-03 LAB — IGG, IGA, IGM: IgA: 158 mg/dL (ref 69–380)

## 2012-05-03 LAB — KAPPA/LAMBDA LIGHT CHAINS: Kappa:Lambda Ratio: 1.21 (ref 0.26–1.65)

## 2012-05-03 LAB — IFE INTERPRETATION

## 2012-05-21 ENCOUNTER — Telehealth: Payer: Self-pay | Admitting: *Deleted

## 2012-05-21 NOTE — Telephone Encounter (Signed)
Message copied by Anselm Jungling on Fri May 21, 2012 12:03 PM ------      Message from: Arlan Organ R      Created: Wed May 05, 2012  6:50 PM       Please call and let her know that the myeloma is still in remission. Her kidney function is holding steady. Cindee Lame

## 2012-05-21 NOTE — Telephone Encounter (Signed)
Called patient to let her know that her myeloma is still is remision and kidney function is holding steady.  Message left on personal answering machine

## 2012-08-27 ENCOUNTER — Ambulatory Visit (HOSPITAL_BASED_OUTPATIENT_CLINIC_OR_DEPARTMENT_OTHER): Payer: Medicare Other | Admitting: Hematology & Oncology

## 2012-08-27 ENCOUNTER — Other Ambulatory Visit (HOSPITAL_BASED_OUTPATIENT_CLINIC_OR_DEPARTMENT_OTHER): Payer: Medicare Other | Admitting: Lab

## 2012-08-27 VITALS — BP 118/73 | HR 71 | Temp 98.0°F | Resp 18 | Ht 60.0 in | Wt 209.0 lb

## 2012-08-27 DIAGNOSIS — N182 Chronic kidney disease, stage 2 (mild): Secondary | ICD-10-CM

## 2012-08-27 DIAGNOSIS — C9000 Multiple myeloma not having achieved remission: Secondary | ICD-10-CM

## 2012-08-27 LAB — CBC WITH DIFFERENTIAL (CANCER CENTER ONLY)
BASO#: 0 10*3/uL (ref 0.0–0.2)
Eosinophils Absolute: 0.1 10*3/uL (ref 0.0–0.5)
HGB: 14.3 g/dL (ref 11.6–15.9)
LYMPH#: 2.5 10*3/uL (ref 0.9–3.3)
MONO#: 0.3 10*3/uL (ref 0.1–0.9)
NEUT#: 2.1 10*3/uL (ref 1.5–6.5)
RBC: 4.24 10*6/uL (ref 3.70–5.32)

## 2012-08-27 NOTE — Patient Instructions (Addendum)
Call if problems 

## 2012-08-27 NOTE — Progress Notes (Signed)
This office note has been dictated.

## 2012-08-28 NOTE — Progress Notes (Signed)
CC:   Terrial Rhodes, M.D. Ralene Ok, M.D. Verita Schneiders, MD  DIAGNOSIS:  Kappa light chain myeloma, clinical remission.  CURRENT THERAPY:  Observation.  INTERIM HISTORY:  Ms. Nordhoff comes in for followup.  She is doing quite well.  We last saw her back in June.  She had a good summer.  She has had no health problems as far as I can tell since we last saw her.  When we last saw her, her monoclonal spike was not detectable.  Her kappa light chain was 3 mg/dL.  She is off Revlimid.  Her last renal function showed a BUN of 24 with a creatinine of 1.6.  She has had no leg swelling.  There have been no rashes.  There has been no pruritus.  She has had no cough or shortness of breath.  There has been no headache.  PHYSICAL EXAMINATION:  This is a mildly obese white female in no obvious distress.  Vital signs:  98, pulse 71, respiratory rate 18, blood pressure 118/73.  Weight is 209.  Head and neck:  Normocephalic, atraumatic skull.  There are no ocular or oral lesions.  There is no scleral icterus.  There is no adenopathy in the neck.  Abdomen:  Soft with good bowel sounds.  There is no palpable abdominal mass.  There is no fluid wave.  There is no palpable hepatosplenomegaly.  Back:  No tenderness over the spine, ribs, or hips.  Extremities:  No clubbing, cyanosis or edema.  Neurologic:  No focal neurological deficits.  Skin: Some slightly dry skin.  There is no ecchymosis or petechia.  LABORATORY STUDIES:  White cell count is 5.4, hemoglobin 14.6, hematocrit of 44, and platelet count 187.  IMPRESSION:  Ms. Lariviere is a 61 year old white female with kappa light chain myeloma.  She essentially presented with renal insufficiency.  She ultimately underwent stem cell transplant at [facility] back in March of 2011.  She completed 2 years of Revlimid back in April of 2013.  Of note, we did do, I think, a bone survey on her when she was last here.  The bone survey showed no acute bony  abnormalities.  We will plan to get her back in 4 more months.   ______________________________ Josph Macho, M.D. PRE/MEDQ  D:  08/27/2012  T:  08/28/2012  Job:  [id number]

## 2012-08-31 LAB — IGG, IGA, IGM
IgA: 130 mg/dL (ref 69–380)
IgM, Serum: 79 mg/dL (ref 52–322)

## 2012-08-31 LAB — PROTEIN ELECTROPHORESIS, SERUM, WITH REFLEX
Alpha-1-Globulin: 3.8 % (ref 2.9–4.9)
Alpha-2-Globulin: 10.7 % (ref 7.1–11.8)
Beta Globulin: 6.1 % (ref 4.7–7.2)
Gamma Globulin: 15.7 % (ref 11.1–18.8)
Total Protein, Serum Electrophoresis: 6.7 g/dL (ref 6.0–8.3)

## 2012-08-31 LAB — COMPREHENSIVE METABOLIC PANEL
Albumin: 4.2 g/dL (ref 3.5–5.2)
BUN: 24 mg/dL — ABNORMAL HIGH (ref 6–23)
Calcium: 9 mg/dL (ref 8.4–10.5)
Chloride: 107 mEq/L (ref 96–112)
Glucose, Bld: 86 mg/dL (ref 70–99)
Potassium: 4.3 mEq/L (ref 3.5–5.3)

## 2012-09-01 ENCOUNTER — Telehealth: Payer: Self-pay | Admitting: *Deleted

## 2012-09-01 NOTE — Telephone Encounter (Addendum)
Message copied by Mirian Capuchin on Wed Sep 01, 2012  5:29 PM ------      Message from: Arlan Organ R      Created: Wed Sep 01, 2012  7:33 AM       Call and let her know that there is no active myeloma. Thanks. Cindee Lame This message given to pt.  Voiced understanding.

## 2012-12-31 ENCOUNTER — Other Ambulatory Visit (HOSPITAL_BASED_OUTPATIENT_CLINIC_OR_DEPARTMENT_OTHER): Payer: Medicare Other | Admitting: Lab

## 2012-12-31 ENCOUNTER — Ambulatory Visit (HOSPITAL_BASED_OUTPATIENT_CLINIC_OR_DEPARTMENT_OTHER): Payer: Medicare Other | Admitting: Hematology & Oncology

## 2012-12-31 VITALS — BP 105/70 | HR 73 | Temp 97.8°F | Resp 16 | Ht 60.0 in | Wt 213.0 lb

## 2012-12-31 DIAGNOSIS — C9000 Multiple myeloma not having achieved remission: Secondary | ICD-10-CM

## 2012-12-31 DIAGNOSIS — N182 Chronic kidney disease, stage 2 (mild): Secondary | ICD-10-CM

## 2012-12-31 LAB — CBC WITH DIFFERENTIAL (CANCER CENTER ONLY)
BASO#: 0 10*3/uL (ref 0.0–0.2)
Eosinophils Absolute: 0.1 10*3/uL (ref 0.0–0.5)
HCT: 42 % (ref 34.8–46.6)
HGB: 14 g/dL (ref 11.6–15.9)
LYMPH%: 43.6 % (ref 14.0–48.0)
MCH: 33.3 pg (ref 26.0–34.0)
MCHC: 33.3 g/dL (ref 32.0–36.0)
MCV: 100 fL (ref 81–101)
MONO%: 7.8 % (ref 0.0–13.0)
NEUT#: 2.8 10*3/uL (ref 1.5–6.5)
NEUT%: 46.7 % (ref 39.6–80.0)
RBC: 4.21 10*6/uL (ref 3.70–5.32)

## 2012-12-31 NOTE — Progress Notes (Signed)
This office note has been dictated.

## 2013-01-01 NOTE — Progress Notes (Signed)
CC:   Ralene Ok, M.D. Terrial Rhodes, M.D. Verita Schneiders, MD  DIAGNOSIS:  Kappa light chain myeloma, clinical remission.  CURRENT THERAPY:  Observation.  INTERIM HISTORY:  Ms. Scioli comes in for a followup.  We last saw her back in October.  Since then, she has been doing well.  She has had no complaints since we last saw her.  Her renal function has not deteriorated.  She has had no fever, sweats or chills.  There has been no bleeding. Her appetite has been good.  She has had no bony pain.  She has had no leg swelling.  There have been no rashes.  When we last saw her, her serum kappa light chain was 2.17 mg/dL, which is stable.  PHYSICAL EXAMINATION:  This is a well-developed, well-nourished white female in no obvious distress.  Vital signs:  Temperature of 97.8, pulse 73, respiratory rate 16, blood pressure 105/70.  Weight is 213. Head/neck:  Normocephalic, atraumatic skull.  There are no ocular or oral lesions.  There are no palpable cervical or supraclavicular lymph nodes.  Lungs:  Clear to percussion and auscultation bilaterally. Cardiac:  Regular rate and rhythm with a normal S1 and S2.  There are no murmurs, rubs or bruits.  Abdomen:  Soft with good bowel sounds.  There is no palpable abdominal mass.  There is no palpable hepatosplenomegaly. Back:  No tenderness over the spine, ribs, or hips.  Extremities:  No clubbing, cyanosis or edema.  Skin:  No rashes, ecchymosis or petechia.  LABORATORY STUDIES:  White cell count 16, hemoglobin 14, hematocrit 42, platelet count 194.  IMPRESSION:  Ms. Marrin is a very nice 62 year old white female with kappa light chain myeloma.  She has done incredibly well.  She now is almost 3 years out from her transplant at [facility].  I am glad that her renal function has not deteriorated.  For now, we will plan to get her back in 6 months now.  I do not see any need for any additional x-rays or lab studies on  her.    ______________________________ Josph Macho, M.D. PRE/MEDQ  D:  12/31/2012  T:  01/01/2013  Job:  [id number]

## 2013-01-04 LAB — PROTEIN ELECTROPHORESIS, SERUM, WITH REFLEX
Alpha-1-Globulin: 3.8 % (ref 2.9–4.9)
Gamma Globulin: 14.3 % (ref 11.1–18.8)
Total Protein, Serum Electrophoresis: 6.9 g/dL (ref 6.0–8.3)

## 2013-01-04 LAB — COMPREHENSIVE METABOLIC PANEL
Albumin: 4.4 g/dL (ref 3.5–5.2)
Alkaline Phosphatase: 52 U/L (ref 39–117)
BUN: 24 mg/dL — ABNORMAL HIGH (ref 6–23)
Creatinine, Ser: 1.5 mg/dL — ABNORMAL HIGH (ref 0.50–1.10)
Glucose, Bld: 94 mg/dL (ref 70–99)
Potassium: 4.1 mEq/L (ref 3.5–5.3)

## 2013-01-04 LAB — IGG, IGA, IGM: IgG (Immunoglobin G), Serum: 1280 mg/dL (ref 690–1700)

## 2013-01-04 LAB — KAPPA/LAMBDA LIGHT CHAINS
Kappa:Lambda Ratio: 0.84 (ref 0.26–1.65)
Lambda Free Lght Chn: 2.22 mg/dL (ref 0.57–2.63)

## 2013-06-02 DIAGNOSIS — C9001 Multiple myeloma in remission: Secondary | ICD-10-CM | POA: Insufficient documentation

## 2013-07-01 ENCOUNTER — Other Ambulatory Visit (HOSPITAL_BASED_OUTPATIENT_CLINIC_OR_DEPARTMENT_OTHER): Payer: Medicare Other | Admitting: Lab

## 2013-07-01 ENCOUNTER — Ambulatory Visit (HOSPITAL_BASED_OUTPATIENT_CLINIC_OR_DEPARTMENT_OTHER): Payer: Medicare Other | Admitting: Hematology & Oncology

## 2013-07-01 VITALS — BP 119/81 | HR 90 | Temp 98.1°F | Resp 16 | Ht 60.0 in | Wt 217.0 lb

## 2013-07-01 DIAGNOSIS — C9000 Multiple myeloma not having achieved remission: Secondary | ICD-10-CM

## 2013-07-01 DIAGNOSIS — C9001 Multiple myeloma in remission: Secondary | ICD-10-CM

## 2013-07-01 DIAGNOSIS — N289 Disorder of kidney and ureter, unspecified: Secondary | ICD-10-CM

## 2013-07-01 LAB — CBC WITH DIFFERENTIAL (CANCER CENTER ONLY)
BASO%: 0.2 % (ref 0.0–2.0)
Eosinophils Absolute: 0.2 10*3/uL (ref 0.0–0.5)
MONO#: 0.4 10*3/uL (ref 0.1–0.9)
NEUT#: 2.8 10*3/uL (ref 1.5–6.5)
Platelets: 194 10*3/uL (ref 145–400)
RBC: 4.3 10*6/uL (ref 3.70–5.32)
RDW: 12.8 % (ref 11.1–15.7)
WBC: 6 10*3/uL (ref 3.9–10.0)

## 2013-07-01 NOTE — Progress Notes (Signed)
This office note has been dictated.

## 2013-07-02 NOTE — Progress Notes (Signed)
CC:   Marcia [name], M.D. Marcia [name], M.D. Marcia Schneiders, MD  DIAGNOSIS:  Kappa light chain myeloma (in clinical remission).  CURRENT THERAPY:  Observation.  INTERIM HISTORY:  Marcia [name] comes in for a followup.  She is doing quite well.  She has had no complaints at all.  We see her every 6 months.  Her mother is still in a nursing home in [place].  Marcia [name] has not been able to go out to see her.  Her mother unfortunately has pretty severe dementia.  Marcia [name] myeloma studies have never shown any kind of recurrence. Her kappa light chain when we last saw her in February showed it was 1.86 mg/dL.  She does have some renal insufficiency which is being followed by Nephrology, but again this is stable.  She has had no problems with nausea or vomiting.  She has had no bony pain.  Every now and then, she does have some arthritic issues.  She has had no fever, sweats, or chills.  PHYSICAL EXAM:  General:  This is a well-developed, well-nourished white female in no obvious distress.  Vital Signs:  Show a temperature of 98.1, pulse 90, respiratory rate 16, blood pressure 119/81.  Weight is 217.  Head and Neck:  Show a normocephalic, atraumatic skull.  There are no ocular or oral lesions.  There are no palpable cervical or supraclavicular lymph nodes.  Lungs:  Clear bilaterally.  Cardiac: Regular rate and rhythm with a normal S1, S2.  There are no murmurs, rubs, or bruits.  Abdomen:  Soft.  She has good bowel sounds.  There is no fluid wave.  There is no palpable hepatosplenomegaly.  Extremities: Show no clubbing, cyanosis, or edema.  Neurological:  Shows no focal neurological deficits.  LABORATORY STUDIES:  White cell count 16, hemoglobin 14.3, hematocrit 43.8.  Platelet count is 194.  IMPRESSION:  Marcia [name] is a very charming 62 year old white female with kappa light chain myeloma.  She essentially presented with renal insufficiency.  This was back in April of  2010.  She ultimately underwent a transplant at [facility], I think, in September 2011.  She is still in remission from my point of view.  We will go ahead and plan to get her back in another 6 months.  I do not see a need for any blood work in between visits.    ______________________________ Josph Macho, M.D. PRE/MEDQ  D:  07/01/2013  T:  07/02/2013  Job:  [id number]

## 2013-07-04 LAB — COMPREHENSIVE METABOLIC PANEL
ALT: 21 U/L (ref 0–35)
Albumin: 4.1 g/dL (ref 3.5–5.2)
Alkaline Phosphatase: 51 U/L (ref 39–117)
CO2: 26 mEq/L (ref 19–32)
Glucose, Bld: 93 mg/dL (ref 70–99)
Potassium: 4.3 mEq/L (ref 3.5–5.3)
Sodium: 139 mEq/L (ref 135–145)
Total Protein: 6.7 g/dL (ref 6.0–8.3)

## 2013-07-04 LAB — KAPPA/LAMBDA LIGHT CHAINS: Lambda Free Lght Chn: 2.57 mg/dL (ref 0.57–2.63)

## 2014-01-02 ENCOUNTER — Encounter: Payer: Self-pay | Admitting: Hematology & Oncology

## 2014-01-02 ENCOUNTER — Ambulatory Visit (HOSPITAL_BASED_OUTPATIENT_CLINIC_OR_DEPARTMENT_OTHER): Payer: Medicare Other | Admitting: Hematology & Oncology

## 2014-01-02 ENCOUNTER — Ambulatory Visit (HOSPITAL_BASED_OUTPATIENT_CLINIC_OR_DEPARTMENT_OTHER): Payer: Medicare Other | Admitting: Lab

## 2014-01-02 VITALS — BP 153/86 | HR 71 | Temp 98.1°F | Resp 14 | Ht 60.0 in | Wt 218.0 lb

## 2014-01-02 DIAGNOSIS — C9001 Multiple myeloma in remission: Secondary | ICD-10-CM

## 2014-01-02 LAB — CBC WITH DIFFERENTIAL (CANCER CENTER ONLY)
BASO#: 0 10*3/uL (ref 0.0–0.2)
BASO%: 0.2 % (ref 0.0–2.0)
EOS%: 2.7 % (ref 0.0–7.0)
Eosinophils Absolute: 0.2 10*3/uL (ref 0.0–0.5)
HEMATOCRIT: 43.5 % (ref 34.8–46.6)
HGB: 14 g/dL (ref 11.6–15.9)
LYMPH#: 2.5 10*3/uL (ref 0.9–3.3)
LYMPH%: 41.9 % (ref 14.0–48.0)
MCH: 31.9 pg (ref 26.0–34.0)
MCHC: 32.2 g/dL (ref 32.0–36.0)
MCV: 99 fL (ref 81–101)
MONO#: 0.3 10*3/uL (ref 0.1–0.9)
MONO%: 5.3 % (ref 0.0–13.0)
NEUT#: 2.9 10*3/uL (ref 1.5–6.5)
NEUT%: 49.9 % (ref 39.6–80.0)
PLATELETS: 175 10*3/uL (ref 145–400)
RBC: 4.39 10*6/uL (ref 3.70–5.32)
RDW: 12.7 % (ref 11.1–15.7)
WBC: 5.9 10*3/uL (ref 3.9–10.0)

## 2014-01-02 LAB — CMP (CANCER CENTER ONLY)
ALK PHOS: 42 U/L (ref 26–84)
ALT: 24 U/L (ref 10–47)
AST: 20 U/L (ref 11–38)
Albumin: 3.6 g/dL (ref 3.3–5.5)
BUN, Bld: 22 mg/dL (ref 7–22)
CALCIUM: 8.7 mg/dL (ref 8.0–10.3)
CO2: 30 mEq/L (ref 18–33)
CREATININE: 1.3 mg/dL — AB (ref 0.6–1.2)
Chloride: 107 mEq/L (ref 98–108)
Glucose, Bld: 100 mg/dL (ref 73–118)
Potassium: 4 mEq/L (ref 3.3–4.7)
Sodium: 142 mEq/L (ref 128–145)
Total Bilirubin: 0.7 mg/dl (ref 0.20–1.60)
Total Protein: 7.1 g/dL (ref 6.4–8.1)

## 2014-01-03 NOTE — Progress Notes (Signed)
Hematology and Oncology Follow Up Visit  Marcia [name] [id number] 09/14/1951 63 y.o. 01/03/2014   Principle Diagnosis:   Kappa light chain myeloma-remission  Current Therapy:    Observation     Interim History:  Marcia [name] is comes in for followup. We see her every 6 months. She's doing well herself. Unfortunately, her mother, lives in [place], not doing too well. She still has dementia which is progressing. Marcia [name] thinks that she'll go out sometime in the spring time to see her.  Marcia [name], with respect to her myeloma, has not had a problem. Her last light chain studies that we did still do not show any evidence of progressive disease.  Medications: Current outpatient prescriptions:aspirin EC 81 MG tablet, Take 81 mg by mouth daily., Disp: , Rfl: ;  Doxylamine Succinate, Sleep, (UNISOM PO), Take by mouth at bedtime as needed., Disp: , Rfl:   Allergies:  Allergies  Allergen Reactions  . Other     Patient states she had N&V with Diarrhea from an antibiotic that begins with a Bioxin    Past Medical History, Surgical history, Social history, and Family History were reviewed and updated.  Review of Systems: As above  Physical Exam:  height is 5' (1.524 m) and weight is 218 lb (98.884 kg). Her oral temperature is 98.1 F (36.7 C). Her blood pressure is 153/86 and her pulse is 71. Her respiration is 14.   This is a mildly obese white female in no obvious distress. Her head and neck exam shows no ocular or oral lesions. She is no palpable cervical supra-clavicular lymph nodes. Lungs are clear. Cardiac exam regular rate and rhythm with no murmurs rubs or bruits. Abdomen is soft. She has good bowel sounds. There is no fluid wave. There is no palpable hepato- splenomegaly. Extremities shows no clubbing cyanosis or edema. Neurological exam shows no focal neurological deficits.  Lab Results  Component Value Date   WBC 5.9 01/02/2014   HGB 14.0 01/02/2014   HCT 43.5 01/02/2014   MCV 99 01/02/2014   PLT 175 01/02/2014     Chemistry      Component Value Date/Time   NA 142 01/02/2014 0805   NA 139 07/01/2013 0801   K 4.0 01/02/2014 0805   K 4.3 07/01/2013 0801   CL 107 01/02/2014 0805   CL 105 07/01/2013 0801   CO2 30 01/02/2014 0805   CO2 26 07/01/2013 0801   BUN 22 01/02/2014 0805   BUN 23 07/01/2013 0801   CREATININE 1.3* 01/02/2014 0805   CREATININE 1.64* 07/01/2013 0801      Component Value Date/Time   CALCIUM 8.7 01/02/2014 0805   CALCIUM 9.2 07/01/2013 0801   ALKPHOS 42 01/02/2014 0805   ALKPHOS 51 07/01/2013 0801   AST 20 01/02/2014 0805   AST 20 07/01/2013 0801   ALT 24 01/02/2014 0805   ALT 21 07/01/2013 0801   BILITOT 0.70 01/02/2014 0805   BILITOT 0.5 07/01/2013 0801     Her Kappa light chain is 2.28 milligrams per deciliter. There is no M spike in her serum.    Impression and Plan: Marcia [name] is a 63 year old white female with Kappa light chain myeloma. She is in remission. She underwent stem cell transplant at [facility] in September 2011.  There is no evidence of recurrence. Her renal function continues to improve which is nice to see.  We'll plan for another six-month followup.   [name], MD 2/10/20153:50 PM

## 2014-01-04 LAB — PROTEIN ELECTROPHORESIS, SERUM, WITH REFLEX
Albumin ELP: 62 % (ref 55.8–66.1)
Alpha-1-Globulin: 3.2 % (ref 2.9–4.9)
Alpha-2-Globulin: 10.8 % (ref 7.1–11.8)
BETA 2: 4.4 % (ref 3.2–6.5)
Beta Globulin: 6.5 % (ref 4.7–7.2)
Gamma Globulin: 13.1 % (ref 11.1–18.8)
TOTAL PROTEIN, SERUM ELECTROPHOR: 6.7 g/dL (ref 6.0–8.3)

## 2014-01-04 LAB — KAPPA/LAMBDA LIGHT CHAINS
Kappa free light chain: 2.28 mg/dL — ABNORMAL HIGH (ref 0.33–1.94)
Kappa:Lambda Ratio: 0.98 (ref 0.26–1.65)
Lambda Free Lght Chn: 2.33 mg/dL (ref 0.57–2.63)

## 2014-01-04 LAB — IGG, IGA, IGM
IGA: 140 mg/dL (ref 69–380)
IGG (IMMUNOGLOBIN G), SERUM: 1080 mg/dL (ref 690–1700)
IgM, Serum: 101 mg/dL (ref 52–322)

## 2014-01-04 LAB — IFE INTERPRETATION

## 2014-01-06 ENCOUNTER — Telehealth: Payer: Self-pay | Admitting: Oncology

## 2014-01-06 NOTE — Telephone Encounter (Addendum)
Message copied by Cottie Banda on Fri Jan 06, 2014  4:17 PM ------      Message from: Burney Gauze R      Created: Thu Jan 05, 2014  9:13 PM       Call - no active myeloma!!  Laurey Arrow ------Spoke with patient.

## 2014-07-03 ENCOUNTER — Other Ambulatory Visit (HOSPITAL_BASED_OUTPATIENT_CLINIC_OR_DEPARTMENT_OTHER): Payer: Medicare Other | Admitting: Lab

## 2014-07-03 ENCOUNTER — Ambulatory Visit (HOSPITAL_BASED_OUTPATIENT_CLINIC_OR_DEPARTMENT_OTHER): Payer: Medicare Other | Admitting: Hematology & Oncology

## 2014-07-03 ENCOUNTER — Encounter: Payer: Self-pay | Admitting: Hematology & Oncology

## 2014-07-03 VITALS — BP 152/87 | HR 70 | Temp 97.9°F | Resp 14 | Ht 60.0 in | Wt 215.0 lb

## 2014-07-03 DIAGNOSIS — C9001 Multiple myeloma in remission: Secondary | ICD-10-CM

## 2014-07-03 LAB — CMP (CANCER CENTER ONLY)
ALBUMIN: 3.6 g/dL (ref 3.3–5.5)
ALT(SGPT): 22 U/L (ref 10–47)
AST: 22 U/L (ref 11–38)
Alkaline Phosphatase: 43 U/L (ref 26–84)
BUN: 21 mg/dL (ref 7–22)
CALCIUM: 8.4 mg/dL (ref 8.0–10.3)
CO2: 27 mEq/L (ref 18–33)
Chloride: 100 mEq/L (ref 98–108)
Creat: 1.6 mg/dl — ABNORMAL HIGH (ref 0.6–1.2)
Glucose, Bld: 103 mg/dL (ref 73–118)
POTASSIUM: 4.1 meq/L (ref 3.3–4.7)
Sodium: 142 mEq/L (ref 128–145)
Total Bilirubin: 0.7 mg/dl (ref 0.20–1.60)
Total Protein: 7.1 g/dL (ref 6.4–8.1)

## 2014-07-03 LAB — CBC WITH DIFFERENTIAL (CANCER CENTER ONLY)
BASO#: 0 10*3/uL (ref 0.0–0.2)
BASO%: 0.2 % (ref 0.0–2.0)
EOS%: 3 % (ref 0.0–7.0)
Eosinophils Absolute: 0.2 10*3/uL (ref 0.0–0.5)
HEMATOCRIT: 43.9 % (ref 34.8–46.6)
HGB: 14.5 g/dL (ref 11.6–15.9)
LYMPH#: 2.7 10*3/uL (ref 0.9–3.3)
LYMPH%: 44.4 % (ref 14.0–48.0)
MCH: 33 pg (ref 26.0–34.0)
MCHC: 33 g/dL (ref 32.0–36.0)
MCV: 100 fL (ref 81–101)
MONO#: 0.4 10*3/uL (ref 0.1–0.9)
MONO%: 6.3 % (ref 0.0–13.0)
NEUT#: 2.8 10*3/uL (ref 1.5–6.5)
NEUT%: 46.1 % (ref 39.6–80.0)
PLATELETS: 194 10*3/uL (ref 145–400)
RBC: 4.39 10*6/uL (ref 3.70–5.32)
RDW: 12.6 % (ref 11.1–15.7)
WBC: 6.1 10*3/uL (ref 3.9–10.0)

## 2014-07-04 NOTE — Progress Notes (Signed)
Hematology and Oncology Follow Up Visit  Marcia [name] [id number] [birth year] 63 y.o. 07/04/2014   Principle Diagnosis:  Kappa light chain myeloma-remission  Current Therapy:   Observation   Interim History:  Ms.  [name] is  back for followup. Was here every 6 months. Unfortunately, her mother passed away in 02-12-2023. She was out in [place]. Marcia [name] was out there for a few weeks to help out.  Marcia [name] has been feeling well. She's had no problems with fever. She's had no infections. Her appetite has been good. There's been no nausea or vomiting. She's trying to exercise more.  There's been no rashes. She's had no change in bowel or bladder habits. She had no cough. She said no headache.  There's been no change in medications. There she is taking her aspirin and vitamin D.  We last saw her, there was no detectable monoclonal spike in her serum. Her light chain level was 2.28 mg/dL. Medications: Current outpatient prescriptions:Acetaminophen (TYLENOL EXTRA STRENGTH) 167 MG/5ML LIQD, Take by mouth as needed., Disp: , Rfl: ;  aspirin EC 81 MG tablet, Take 81 mg by mouth daily., Disp: , Rfl: ;  Doxylamine Succinate, Sleep, (UNISOM PO), Take by mouth at bedtime as needed., Disp: , Rfl:   Allergies:  Allergies  Allergen Reactions  . Other     Patient states she had N&V with Diarrhea from an antibiotic that begins with a Bioxin    Past Medical History, Surgical history, Social history, and Family History were reviewed and updated.  Review of Systems:  as above  Physical Exam:  height is 5' (1.524 m) and weight is 215 lb (97.523 kg). Her oral temperature is 97.9 F (36.6 C). Her blood pressure is 152/87 and her pulse is 70. Her respiration is 14.    well-developed and well-nourished white female. She is mildly obese. Head and neck exam shows no ocular or oral lesion. There are no palpable cervical or supraclavicular lymph nodes. Lungs are clear. Cardiac exam regular in rhythm with  no murmurs rubs or bruits. Abdomen is soft. Has good bowel sounds. There is no fluid wave. She is mildly obese. There is no palpable liver or spleen tip. Back exam shows no tenderness over the spine ribs or hips. Extremities shows no clubbing cyanosis or edema. Neurological exam shows no focal neurological deficits.  Lab Results  Component Value Date   WBC 6.1 07/03/2014   HGB 14.5 07/03/2014   HCT 43.9 07/03/2014   MCV 100 07/03/2014   PLT 194 07/03/2014     Chemistry      Component Value Date/Time   NA 142 07/03/2014 0809   NA 139 07/01/2013 0801   K 4.1 07/03/2014 0809   K 4.3 07/01/2013 0801   CL 100 07/03/2014 0809   CL 105 07/01/2013 0801   CO2 27 07/03/2014 0809   CO2 26 07/01/2013 0801   BUN 21 07/03/2014 0809   BUN 23 07/01/2013 0801   CREATININE 1.6* 07/03/2014 0809   CREATININE 1.64* 07/01/2013 0801      Component Value Date/Time   CALCIUM 8.4 07/03/2014 0809   CALCIUM 9.2 07/01/2013 0801   ALKPHOS 43 07/03/2014 0809   ALKPHOS 51 07/01/2013 0801   AST 22 07/03/2014 0809   AST 20 07/01/2013 0801   ALT 22 07/03/2014 0809   ALT 21 07/01/2013 0801   BILITOT 0.70 07/03/2014 0809   BILITOT 0.5 07/01/2013 0801         Impression and Plan: Marcia [name]  is  63 year old white female with kappa light chain myeloma. She presented back in April of 2011. She did go into remission. She then underwent a stem cell transplant at [facility] in September of 2011.  She continues to do well. There is no evidence of recurrent myeloma.  We will plan to get her back in another 6 months.  I don't see a need for any additional tests.   Marcia Napoleon, MD 8/11/20157:14 AM

## 2014-07-05 ENCOUNTER — Telehealth: Payer: Self-pay | Admitting: *Deleted

## 2014-07-05 LAB — PROTEIN ELECTROPHORESIS, SERUM, WITH REFLEX
ALBUMIN ELP: 60.8 % (ref 55.8–66.1)
Alpha-1-Globulin: 3.7 % (ref 2.9–4.9)
Alpha-2-Globulin: 11.1 % (ref 7.1–11.8)
BETA 2: 5.1 % (ref 3.2–6.5)
BETA GLOBULIN: 6 % (ref 4.7–7.2)
Gamma Globulin: 13.3 % (ref 11.1–18.8)
Total Protein, Serum Electrophoresis: 6.9 g/dL (ref 6.0–8.3)

## 2014-07-05 LAB — KAPPA/LAMBDA LIGHT CHAINS
Kappa free light chain: 2.99 mg/dL — ABNORMAL HIGH (ref 0.33–1.94)
Kappa:Lambda Ratio: 1.06 (ref 0.26–1.65)
Lambda Free Lght Chn: 2.81 mg/dL — ABNORMAL HIGH (ref 0.57–2.63)

## 2014-07-05 LAB — IGG, IGA, IGM
IGG (IMMUNOGLOBIN G), SERUM: 1020 mg/dL (ref 690–1700)
IGM, SERUM: 109 mg/dL (ref 52–322)
IgA: 156 mg/dL (ref 69–380)

## 2014-07-05 NOTE — Telephone Encounter (Addendum)
Message copied by Lenn Sink on Wed Jul 05, 2014  9:04 AM ------      Message from: [name]      Created: Tue Jul 04, 2014 10:13 PM       Call - no obvious myeloma!!  pete ------Left voicemail informing pt that there is no obvious myeloma!!

## 2015-01-01 ENCOUNTER — Ambulatory Visit (HOSPITAL_BASED_OUTPATIENT_CLINIC_OR_DEPARTMENT_OTHER): Payer: 59 | Admitting: Hematology & Oncology

## 2015-01-01 ENCOUNTER — Encounter: Payer: Self-pay | Admitting: Hematology & Oncology

## 2015-01-01 ENCOUNTER — Other Ambulatory Visit (HOSPITAL_BASED_OUTPATIENT_CLINIC_OR_DEPARTMENT_OTHER): Payer: 59 | Admitting: Lab

## 2015-01-01 VITALS — BP 187/87 | HR 73 | Temp 97.9°F | Resp 16 | Ht 60.0 in | Wt 221.0 lb

## 2015-01-01 DIAGNOSIS — C9001 Multiple myeloma in remission: Secondary | ICD-10-CM

## 2015-01-01 DIAGNOSIS — I10 Essential (primary) hypertension: Secondary | ICD-10-CM

## 2015-01-01 LAB — CMP (CANCER CENTER ONLY)
ALBUMIN: 3.6 g/dL (ref 3.3–5.5)
ALT(SGPT): 18 U/L (ref 10–47)
AST: 18 U/L (ref 11–38)
Alkaline Phosphatase: 45 U/L (ref 26–84)
BUN: 20 mg/dL (ref 7–22)
CHLORIDE: 102 meq/L (ref 98–108)
CO2: 27 mEq/L (ref 18–33)
Calcium: 8.8 mg/dL (ref 8.0–10.3)
Creat: 1.2 mg/dl (ref 0.6–1.2)
Glucose, Bld: 96 mg/dL (ref 73–118)
POTASSIUM: 4.3 meq/L (ref 3.3–4.7)
Sodium: 143 mEq/L (ref 128–145)
Total Bilirubin: 0.7 mg/dl (ref 0.20–1.60)
Total Protein: 7 g/dL (ref 6.4–8.1)

## 2015-01-01 LAB — CBC WITH DIFFERENTIAL (CANCER CENTER ONLY)
BASO#: 0 10*3/uL (ref 0.0–0.2)
BASO%: 0.2 % (ref 0.0–2.0)
EOS%: 2.6 % (ref 0.0–7.0)
Eosinophils Absolute: 0.2 10*3/uL (ref 0.0–0.5)
HEMATOCRIT: 45.7 % (ref 34.8–46.6)
HGB: 14.9 g/dL (ref 11.6–15.9)
LYMPH#: 2.8 10*3/uL (ref 0.9–3.3)
LYMPH%: 48.5 % — AB (ref 14.0–48.0)
MCH: 32.3 pg (ref 26.0–34.0)
MCHC: 32.6 g/dL (ref 32.0–36.0)
MCV: 99 fL (ref 81–101)
MONO#: 0.3 10*3/uL (ref 0.1–0.9)
MONO%: 5.6 % (ref 0.0–13.0)
NEUT#: 2.4 10*3/uL (ref 1.5–6.5)
NEUT%: 43.1 % (ref 39.6–80.0)
Platelets: 199 10*3/uL (ref 145–400)
RBC: 4.62 10*6/uL (ref 3.70–5.32)
RDW: 12.7 % (ref 11.1–15.7)
WBC: 5.7 10*3/uL (ref 3.9–10.0)

## 2015-01-01 NOTE — Progress Notes (Signed)
Hematology and Oncology Follow Up Visit  Marcia [name] [id number] 04-Apr-1951 64 y.o. 01/01/2015   Principle Diagnosis:  Kappa light chain myeloma-remission  Current Therapy:   Observation   Interim History:  Marcia [name] is  back for followup. We last saw her back in August. Since then, she's been doing quite well. She's had no problems over the holidays. She may go back out to [place] for visit or she may have relatives coming to see her.   Marcia [name] has been feeling well. She's had no problems with fever. She's had no infections. Her appetite has been good. Unfortunately, she has gained weight. I think this really is going to be a problem for her. She has a treadmill at home which she says she got over the holidays so hopefully she will use this.  There's been no nausea or vomiting.   There's been no rashes. She's had no change in bowel or bladder habits. She had no cough. She said no headache.  There's been no change in medications. There she is taking her aspirin and vitamin D.  We last saw her, there was no detectable monoclonal spike in her serum. Her Kappa light chain level was 2.28 mg/dL. Medications:  Current outpatient prescriptions:  .  Acetaminophen (TYLENOL EXTRA STRENGTH) 167 MG/5ML LIQD, Take by mouth as needed., Disp: , Rfl:  .  aspirin EC 81 MG tablet, Take 81 mg by mouth daily., Disp: , Rfl:  .  Doxylamine Succinate, Sleep, (UNISOM PO), Take by mouth at bedtime as needed., Disp: , Rfl:  .  Multiple Vitamin (MULTIVITAMIN) capsule, Take 1 capsule by mouth daily., Disp: , Rfl:   Allergies:  Allergies  Allergen Reactions  . Other     Patient states she had N&V with Diarrhea from an antibiotic that begins with a Bioxin    Past Medical History, Surgical history, Social history, and Family History were reviewed and updated.  Review of Systems:  as above  Physical Exam:  height is 5' (1.524 m) and weight is 221 lb (100.245 kg). Her oral temperature is 97.9  F (36.6 C). Her blood pressure is 187/87 and her pulse is 73. Her respiration is 16.    well-developed and well-nourished white female. She is mildly obese. Head and neck exam shows no ocular or oral lesion. There are no palpable cervical or supraclavicular lymph nodes. Lungs are clear. Cardiac exam regular rate and rhythm with no murmurs rubs or bruits. Abdomen is soft. Has good bowel sounds. There is no fluid wave. She is mildly obese. There is no palpable liver or spleen tip. Back exam shows no tenderness over the spine ribs or hips. Extremities shows no clubbing cyanosis or edema. Neurological exam shows no focal neurological deficits.  Lab Results  Component Value Date   WBC 5.7 01/01/2015   HGB 14.9 01/01/2015   HCT 45.7 01/01/2015   MCV 99 01/01/2015   PLT 199 01/01/2015     Chemistry      Component Value Date/Time   NA 143 01/01/2015 1001   NA 139 07/01/2013 0801   K 4.3 01/01/2015 1001   K 4.3 07/01/2013 0801   CL 102 01/01/2015 1001   CL 105 07/01/2013 0801   CO2 27 01/01/2015 1001   CO2 26 07/01/2013 0801   BUN 20 01/01/2015 1001   BUN 23 07/01/2013 0801   CREATININE 1.2 01/01/2015 1001   CREATININE 1.64* 07/01/2013 0801      Component Value Date/Time   CALCIUM  8.8 01/01/2015 1001   CALCIUM 9.2 07/01/2013 0801   ALKPHOS 45 01/01/2015 1001   ALKPHOS 51 07/01/2013 0801   AST 18 01/01/2015 1001   AST 20 07/01/2013 0801   ALT 18 01/01/2015 1001   ALT 21 07/01/2013 0801   BILITOT 0.70 01/01/2015 1001   BILITOT 0.5 07/01/2013 0801         Impression and Plan: Marcia [name] is  64 year old white female with kappa light chain myeloma. She presented back in April of 2011. She did go into remission. 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  She continues to do well. There is no evidence of recurrent myeloma.  Again, the weight is going be a big problem. Her blood pressure is quite high. I rechecked her blood pressure and it was 158/90.  She says that she will see her doctor in April.  We will plan to get her back in another 6 months.  I don't see a need for any additional tests.   [name], MD 2/8/201611:14 AM

## 2015-01-03 LAB — KAPPA/LAMBDA LIGHT CHAINS
KAPPA FREE LGHT CHN: 3.68 mg/dL — AB (ref 0.33–1.94)
KAPPA LAMBDA RATIO: 1.73 — AB (ref 0.26–1.65)
Lambda Free Lght Chn: 2.13 mg/dL (ref 0.57–2.63)

## 2015-01-03 LAB — PROTEIN ELECTROPHORESIS, SERUM, WITH REFLEX
ALBUMIN ELP: 60.3 % (ref 55.8–66.1)
ALPHA-1-GLOBULIN: 3.7 % (ref 2.9–4.9)
ALPHA-2-GLOBULIN: 11.1 % (ref 7.1–11.8)
BETA 2: 4.9 % (ref 3.2–6.5)
Beta Globulin: 6.2 % (ref 4.7–7.2)
Gamma Globulin: 13.8 % (ref 11.1–18.8)
Total Protein, Serum Electrophoresis: 6.7 g/dL (ref 6.0–8.3)

## 2015-01-03 LAB — IGG, IGA, IGM
IGG (IMMUNOGLOBIN G), SERUM: 973 mg/dL (ref 690–1700)
IGM, SERUM: 105 mg/dL (ref 52–322)
IgA: 164 mg/dL (ref 69–380)

## 2015-01-03 LAB — LACTATE DEHYDROGENASE: LDH: 109 U/L (ref 94–250)

## 2015-01-04 ENCOUNTER — Telehealth: Payer: Self-pay | Admitting: *Deleted

## 2015-01-04 NOTE — Telephone Encounter (Signed)
-----   Message from [name], MD sent at 01/03/2015  5:09 PM EST ----- Call - myeloma still not detecable!!  pete

## 2015-07-02 ENCOUNTER — Encounter: Payer: Self-pay | Admitting: Hematology & Oncology

## 2015-07-02 ENCOUNTER — Ambulatory Visit (HOSPITAL_BASED_OUTPATIENT_CLINIC_OR_DEPARTMENT_OTHER): Payer: Medicare Other | Admitting: Hematology & Oncology

## 2015-07-02 ENCOUNTER — Other Ambulatory Visit (HOSPITAL_BASED_OUTPATIENT_CLINIC_OR_DEPARTMENT_OTHER): Payer: 59

## 2015-07-02 VITALS — BP 158/86 | HR 71 | Temp 97.9°F | Resp 20 | Ht 60.0 in | Wt 209.0 lb

## 2015-07-02 DIAGNOSIS — C9001 Multiple myeloma in remission: Secondary | ICD-10-CM

## 2015-07-02 DIAGNOSIS — Z9484 Stem cells transplant status: Secondary | ICD-10-CM | POA: Diagnosis not present

## 2015-07-02 DIAGNOSIS — I10 Essential (primary) hypertension: Secondary | ICD-10-CM

## 2015-07-02 LAB — CMP (CANCER CENTER ONLY)
ALBUMIN: 4.1 g/dL (ref 3.3–5.5)
ALK PHOS: 36 U/L (ref 26–84)
ALT(SGPT): 21 U/L (ref 10–47)
AST: 24 U/L (ref 11–38)
BUN, Bld: 23 mg/dL — ABNORMAL HIGH (ref 7–22)
CHLORIDE: 104 meq/L (ref 98–108)
CO2: 29 meq/L (ref 18–33)
Calcium: 9.7 mg/dL (ref 8.0–10.3)
Creat: 1.6 mg/dl — ABNORMAL HIGH (ref 0.6–1.2)
GLUCOSE: 97 mg/dL (ref 73–118)
Potassium: 4.6 mEq/L (ref 3.3–4.7)
Sodium: 144 mEq/L (ref 128–145)
Total Bilirubin: 0.8 mg/dl (ref 0.20–1.60)
Total Protein: 7.7 g/dL (ref 6.4–8.1)

## 2015-07-02 LAB — CBC WITH DIFFERENTIAL (CANCER CENTER ONLY)
BASO#: 0 10*3/uL (ref 0.0–0.2)
BASO%: 0.3 % (ref 0.0–2.0)
EOS ABS: 0.1 10*3/uL (ref 0.0–0.5)
EOS%: 1.9 % (ref 0.0–7.0)
HCT: 45.4 % (ref 34.8–46.6)
HGB: 15.1 g/dL (ref 11.6–15.9)
LYMPH#: 3.1 10*3/uL (ref 0.9–3.3)
LYMPH%: 49.8 % — AB (ref 14.0–48.0)
MCH: 33.6 pg (ref 26.0–34.0)
MCHC: 33.3 g/dL (ref 32.0–36.0)
MCV: 101 fL (ref 81–101)
MONO#: 0.3 10*3/uL (ref 0.1–0.9)
MONO%: 4.2 % (ref 0.0–13.0)
NEUT#: 2.7 10*3/uL (ref 1.5–6.5)
NEUT%: 43.8 % (ref 39.6–80.0)
Platelets: 206 10*3/uL (ref 145–400)
RBC: 4.49 10*6/uL (ref 3.70–5.32)
RDW: 12.4 % (ref 11.1–15.7)
WBC: 6.2 10*3/uL (ref 3.9–10.0)

## 2015-07-02 NOTE — Progress Notes (Signed)
Hematology and Oncology Follow Up Visit  Marcia [name] [id number] 07-15-1951 64 y.o. 07/02/2015   Principle Diagnosis:  Kappa light chain myeloma-remission  Current Therapy:   Observation   Interim History:  Ms.  [name] is  back for followup. We last saw her back in February. Since then, she been doing quite well. She's lost 21 pounds. She's on a Mediterranean diet. She is very happy about this. She's had no travel plans. She may go to the mountains in the fall.  She's due for a mammogram in September. This is been on set up.  She's had no change in bowel or bladder habits. She's had no abdominal pain. There's been no cough. She's had no fever.  She's had no rashes. There's been no leg swelling.  Her blood pressure is up a little bit. She is going back onto her blood pressure medicine.    We last saw her, there was no detectable monoclonal spike in her serum. Her Kappa light chain level was 3.68 mg/dL.   Overall, her performance status is ECOG 0.  Medications:  Current outpatient prescriptions:  .  Acetaminophen (TYLENOL EXTRA STRENGTH) 167 MG/5ML LIQD, Take by mouth as needed., Disp: , Rfl:  .  aspirin EC 81 MG tablet, Take 81 mg by mouth daily., Disp: , Rfl:  .  lisinopril (PRINIVIL,ZESTRIL) 10 MG tablet, Take 10 mg by mouth daily., Disp: , Rfl: 3 .  Multiple Vitamin (MULTIVITAMIN) capsule, Take 1 capsule by mouth daily., Disp: , Rfl:  .  Doxylamine Succinate, Sleep, (UNISOM PO), Take by mouth at bedtime as needed., Disp: , Rfl:   Allergies:  Allergies  Allergen Reactions  . Other     Patient states she had N&V with Diarrhea from an antibiotic that begins with a Bioxin    Past Medical History, Surgical history, Social history, and Family History were reviewed and updated.  Review of Systems:  as above  Physical Exam:  height is 5' (1.524 m) and weight is 209 lb (94.802 kg). Her oral temperature is 97.9 F (36.6 C). Her blood pressure is 158/86 and her pulse is 71.  Her respiration is 20.    Well-developed and well-nourished white female. She is mildly obese. Head and neck exam shows no ocular or oral lesion. There are no palpable cervical or supraclavicular lymph nodes. Lungs are clear. Cardiac exam regular rate and rhythm with no murmurs rubs or bruits. Abdomen is soft. Has good bowel sounds. There is no fluid wave. She is mildly obese. There is no palpable liver or spleen tip. Back exam shows no tenderness over the spine ribs or hips. Extremities shows no clubbing cyanosis or edema. Neurological exam shows no focal neurological deficits.Skin exam shows no rashes, ecchymoses or petechia.  Lab Results  Component Value Date   WBC 6.2 07/02/2015   HGB 15.1 07/02/2015   HCT 45.4 07/02/2015   MCV 101 07/02/2015   PLT 206 07/02/2015     Chemistry      Component Value Date/Time   NA 144 07/02/2015 0845   NA 139 07/01/2013 0801   K 4.6 07/02/2015 0845   K 4.3 07/01/2013 0801   CL 104 07/02/2015 0845   CL 105 07/01/2013 0801   CO2 29 07/02/2015 0845   CO2 26 07/01/2013 0801   BUN 23* 07/02/2015 0845   BUN 23 07/01/2013 0801   CREATININE 1.6* 07/02/2015 0845   CREATININE 1.64* 07/01/2013 0801      Component Value Date/Time   CALCIUM 9.7  07/02/2015 0845   CALCIUM 9.2 07/01/2013 0801   ALKPHOS 36 07/02/2015 0845   ALKPHOS 51 07/01/2013 0801   AST 24 07/02/2015 0845   AST 20 07/01/2013 0801   ALT 21 07/02/2015 0845   ALT 21 07/01/2013 0801   BILITOT 0.80 07/02/2015 0845   BILITOT 0.5 07/01/2013 0801         Impression and Plan: Marcia [name] is 64 year old white female with kappa light chain myeloma. She presented back in April of 2011. She did go into remission. She then underwent a stem cell transplant at [facility] in September of 2011.  She continues to do well. There is no evidence of recurrent myeloma.  I am so happy that she has lost weight. She is on a Mediterranean diet. She is quite happy about this.  For now, I think we'll probably  get her back in 6 months.  I don't see a need for any additional tests.   [name], MD 8/8/20169:33 AM

## 2015-07-04 LAB — PROTEIN ELECTROPHORESIS, SERUM, WITH REFLEX
ALBUMIN ELP: 4.3 g/dL (ref 3.8–4.8)
ALPHA-2-GLOBULIN: 0.8 g/dL (ref 0.5–0.9)
Alpha-1-Globulin: 0.3 g/dL (ref 0.2–0.3)
BETA 2: 0.4 g/dL (ref 0.2–0.5)
BETA GLOBULIN: 0.5 g/dL (ref 0.4–0.6)
GAMMA GLOBULIN: 1 g/dL (ref 0.8–1.7)
Total Protein, Serum Electrophoresis: 7.2 g/dL (ref 6.1–8.1)

## 2015-07-04 LAB — KAPPA/LAMBDA LIGHT CHAINS
KAPPA FREE LGHT CHN: 3.92 mg/dL — AB (ref 0.33–1.94)
Kappa:Lambda Ratio: 1.35 (ref 0.26–1.65)
LAMBDA FREE LGHT CHN: 2.9 mg/dL — AB (ref 0.57–2.63)

## 2015-07-04 LAB — IGG, IGA, IGM
IGA: 182 mg/dL (ref 69–380)
IgG (Immunoglobin G), Serum: 1140 mg/dL (ref 690–1700)
IgM, Serum: 109 mg/dL (ref 52–322)

## 2015-07-06 ENCOUNTER — Telehealth: Payer: Self-pay | Admitting: *Deleted

## 2015-07-06 NOTE — Telephone Encounter (Addendum)
Patient aware of results  ----- Message from [name], MD sent at 07/04/2015  6:15 PM EDT ----- Please call and tell her there is no evidence of myeloma. Thanks

## 2015-07-16 ENCOUNTER — Telehealth: Payer: Self-pay | Admitting: Hematology & Oncology

## 2015-07-16 NOTE — Telephone Encounter (Signed)
Returned pt's call and left mess for pt to call back regarding scheduling an appt

## 2015-07-18 ENCOUNTER — Telehealth: Payer: Self-pay | Admitting: Hematology & Oncology

## 2015-07-18 NOTE — Telephone Encounter (Signed)
Returned pt's and moved appt to 2/6 at 10:15

## 2015-08-23 ENCOUNTER — Other Ambulatory Visit: Payer: Self-pay | Admitting: Obstetrics and Gynecology

## 2015-08-27 LAB — CYTOLOGY - PAP

## 2015-12-31 ENCOUNTER — Telehealth: Payer: Self-pay | Admitting: Hematology & Oncology

## 2015-12-31 ENCOUNTER — Other Ambulatory Visit: Payer: 59

## 2015-12-31 ENCOUNTER — Ambulatory Visit: Payer: 59 | Admitting: Hematology & Oncology

## 2015-12-31 NOTE — Telephone Encounter (Signed)
Patient called and cx 12/31/15 apt and resch for 01/07/16.  Rn was notified of cx apt

## 2016-01-01 ENCOUNTER — Ambulatory Visit: Payer: 59 | Admitting: Hematology & Oncology

## 2016-01-01 ENCOUNTER — Other Ambulatory Visit: Payer: 59

## 2016-01-02 ENCOUNTER — Other Ambulatory Visit: Payer: 59

## 2016-01-02 ENCOUNTER — Ambulatory Visit: Payer: 59 | Admitting: Hematology & Oncology

## 2016-01-07 ENCOUNTER — Encounter: Payer: Self-pay | Admitting: Hematology & Oncology

## 2016-01-07 ENCOUNTER — Ambulatory Visit (HOSPITAL_BASED_OUTPATIENT_CLINIC_OR_DEPARTMENT_OTHER): Payer: Medicare Other | Admitting: Hematology & Oncology

## 2016-01-07 ENCOUNTER — Other Ambulatory Visit (HOSPITAL_BASED_OUTPATIENT_CLINIC_OR_DEPARTMENT_OTHER): Payer: 59

## 2016-01-07 VITALS — BP 137/76 | HR 74 | Temp 98.1°F | Resp 16 | Ht 60.0 in | Wt 212.0 lb

## 2016-01-07 DIAGNOSIS — Z9484 Stem cells transplant status: Secondary | ICD-10-CM

## 2016-01-07 DIAGNOSIS — C9001 Multiple myeloma in remission: Secondary | ICD-10-CM

## 2016-01-07 LAB — CBC WITH DIFFERENTIAL (CANCER CENTER ONLY)
BASO#: 0 10*3/uL (ref 0.0–0.2)
BASO%: 0.1 % (ref 0.0–2.0)
EOS%: 1.8 % (ref 0.0–7.0)
Eosinophils Absolute: 0.1 10*3/uL (ref 0.0–0.5)
HCT: 40.9 % (ref 34.8–46.6)
HEMOGLOBIN: 13.7 g/dL (ref 11.6–15.9)
LYMPH#: 3.3 10*3/uL (ref 0.9–3.3)
LYMPH%: 41.8 % (ref 14.0–48.0)
MCH: 32.7 pg (ref 26.0–34.0)
MCHC: 33.5 g/dL (ref 32.0–36.0)
MCV: 98 fL (ref 81–101)
MONO#: 0.4 10*3/uL (ref 0.1–0.9)
MONO%: 5 % (ref 0.0–13.0)
NEUT#: 4.1 10*3/uL (ref 1.5–6.5)
NEUT%: 51.3 % (ref 39.6–80.0)
Platelets: 213 10*3/uL (ref 145–400)
RBC: 4.19 10*6/uL (ref 3.70–5.32)
RDW: 12.1 % (ref 11.1–15.7)
WBC: 8 10*3/uL (ref 3.9–10.0)

## 2016-01-07 LAB — COMPREHENSIVE METABOLIC PANEL (CC13)
ALBUMIN: 4.3 g/dL (ref 3.6–4.8)
ALT: 17 IU/L (ref 0–32)
AST (SGOT): 16 IU/L (ref 0–40)
Albumin/Globulin Ratio: 1.5 (ref 1.1–2.5)
Alkaline Phosphatase, S: 61 IU/L (ref 39–117)
BILIRUBIN TOTAL: 0.4 mg/dL (ref 0.0–1.2)
BUN / CREAT RATIO: 16 (ref 11–26)
BUN: 21 mg/dL (ref 8–27)
CALCIUM: 9 mg/dL (ref 8.7–10.3)
CHLORIDE: 109 mmol/L — AB (ref 96–106)
Carbon Dioxide, Total: 18 mmol/L (ref 18–29)
Creatinine, Ser: 1.29 mg/dL — ABNORMAL HIGH (ref 0.57–1.00)
GFR, EST AFRICAN AMERICAN: 50 mL/min/{1.73_m2} — AB (ref >59)
GFR, EST NON AFRICAN AMERICAN: 43 mL/min/{1.73_m2} — AB (ref >59)
GLUCOSE: 90 mg/dL (ref 65–99)
Globulin, Total: 2.8 g/dL (ref 1.5–4.5)
Potassium, Ser: 4 mmol/L (ref 3.5–5.2)
Sodium: 140 mmol/L (ref 134–144)
TOTAL PROTEIN: 7.1 g/dL (ref 6.0–8.5)

## 2016-01-07 NOTE — Progress Notes (Signed)
Hematology and Oncology Follow Up Visit  Marcia [name] [id number] February 11, 1951 65 y.o. 01/07/2016   Principle Diagnosis:  Kappa light chain myeloma-remission  Current Therapy:   Observation   Interim History:  Ms.  [name] is  back for followup. We last saw Marcia back in August. Since then, she been doing well. She has a new grandson. She is quite happy about this.  She also has a new cat. He weighs 16 pounds.  She's been doing well. She is getting a little more weight. Marcia [name] got to the point but is worried as to how Marcia family might treat Marcia.   There is no detectable monoclonal spike in Marcia serum. Marcia Kappa light chain level was 1.35 mg/dL.   Overall, Marcia performance status is ECOG 0.  Medications:  Current outpatient prescriptions:  .  Acetaminophen (TYLENOL EXTRA STRENGTH) 167 MG/5ML LIQD, Take by mouth as needed., Disp: , Rfl:  .  aspirin EC 81 MG tablet, Take 81 mg by mouth daily., Disp: , Rfl:  .  lisinopril (PRINIVIL,ZESTRIL) 10 MG tablet, Take 10 mg by mouth daily., Disp: , Rfl: 3 .  Multiple Vitamin (MULTIVITAMIN) capsule, Take 1 capsule by mouth daily., Disp: , Rfl:   Allergies:  Allergies  Allergen Reactions  . Other     Patient states she had N&V with Diarrhea from an antibiotic that begins with a Bioxin    Past Medical History, Surgical history, Social history, and Family History were reviewed and updated.  Review of Systems:  as above  Physical Exam:  height is 5' (1.524 m) and weight is 212 lb (96.163 kg). Marcia oral temperature is 98.1 F (36.7 C). Marcia blood pressure is 137/76 and Marcia pulse is 74. Marcia respiration is 16.    Well-developed and well-nourished white female. She is mildly obese. Head and neck exam shows no ocular or oral lesion. There are no palpable cervical or supraclavicular lymph nodes. Lungs are clear. Cardiac exam regular rate and rhythm with no murmurs rubs or bruits. Abdomen is soft. Has good bowel sounds. There is no fluid wave.  She is mildly obese. There is no palpable liver or spleen tip. Back exam shows no tenderness over the spine ribs or hips. Extremities shows no clubbing cyanosis or edema. Neurological exam shows no focal neurological deficits.Skin exam shows no rashes, ecchymoses or petechia.  Lab Results  Component Value Date   WBC 8.0 01/07/2016   HGB 13.7 01/07/2016   HCT 40.9 01/07/2016   MCV 98 01/07/2016   PLT 213 01/07/2016     Chemistry      Component Value Date/Time   NA 144 07/02/2015 0845   NA 139 07/01/2013 0801   K 4.6 07/02/2015 0845   K 4.3 07/01/2013 0801   CL 104 07/02/2015 0845   CL 105 07/01/2013 0801   CO2 29 07/02/2015 0845   CO2 26 07/01/2013 0801   BUN 23* 07/02/2015 0845   BUN 23 07/01/2013 0801   CREATININE 1.6* 07/02/2015 0845   CREATININE 1.64* 07/01/2013 0801      Component Value Date/Time   CALCIUM 9.7 07/02/2015 0845   CALCIUM 9.2 07/01/2013 0801   ALKPHOS 36 07/02/2015 0845   ALKPHOS 51 07/01/2013 0801   AST 24 07/02/2015 0845   AST 20 07/01/2013 0801   ALT 21 07/02/2015 0845   ALT 21 07/01/2013 0801   BILITOT 0.80 07/02/2015 0845   BILITOT 0.5 07/01/2013 0801         Impression and Plan:  [name] is 65 year old white female with kappa light chain myeloma. She presented back in April of 2011. She did go into remission. She then underwent a stem cell transplant at [facility] in September of 2011.  She continues to do well. There is no evidence of recurrent myeloma.  I am happy that the myeloma has not recurred.  She is really doing well. I just wish she would lose a little more weight.  We will plan to see Marcia back in 6 more months.   [name], MD 2/13/20173:01 PM

## 2016-01-08 LAB — MULTIPLE MYELOMA PANEL, SERUM
ALBUMIN SERPL ELPH-MCNC: 3.6 g/dL (ref 2.9–4.4)
Albumin/Glob SerPl: 1.2 (ref 0.7–1.7)
Alpha 1: 0.2 g/dL (ref 0.0–0.4)
Alpha2 Glob SerPl Elph-Mcnc: 0.8 g/dL (ref 0.4–1.0)
B-GLOBULIN SERPL ELPH-MCNC: 1.2 g/dL (ref 0.7–1.3)
Gamma Glob SerPl Elph-Mcnc: 0.9 g/dL (ref 0.4–1.8)
Globulin, Total: 3.1 g/dL (ref 2.2–3.9)
IGA/IMMUNOGLOBULIN A, SERUM: 173 mg/dL (ref 87–352)
IGM (IMMUNOGLOBIN M), SRM: 110 mg/dL (ref 26–217)
IgG, Qn, Serum: 917 mg/dL (ref 700–1600)
TOTAL PROTEIN: 6.7 g/dL (ref 6.0–8.5)

## 2016-01-08 LAB — KAPPA/LAMBDA LIGHT CHAINS
Ig Kappa Free Light Chain: 41.02 mg/L — ABNORMAL HIGH (ref 3.30–19.40)
Ig Lambda Free Light Chain: 26.27 mg/L (ref 5.71–26.30)
KAPPA/LAMBDA FLC RATIO: 1.56 (ref 0.26–1.65)

## 2016-01-09 ENCOUNTER — Telehealth: Payer: Self-pay | Admitting: *Deleted

## 2016-01-09 NOTE — Telephone Encounter (Signed)
-----   Message from [name], MD sent at 01/09/2016 10:32 AM EST ----- Call - No active myeloma.  Marcia [name]

## 2016-01-09 NOTE — Telephone Encounter (Signed)
-----   Message from [name], MD sent at 01/09/2016 10:32 AM EST ----- Call - No active myeloma.  Laurey Arrow

## 2016-05-28 ENCOUNTER — Encounter: Payer: Self-pay | Admitting: Nurse Practitioner

## 2016-05-28 NOTE — Progress Notes (Signed)
[employer] Re-enrollment form received, completed, and returned to specified fax # [phone]. Confirmation received.

## 2016-07-07 ENCOUNTER — Encounter: Payer: Self-pay | Admitting: Hematology & Oncology

## 2016-07-07 ENCOUNTER — Ambulatory Visit (HOSPITAL_BASED_OUTPATIENT_CLINIC_OR_DEPARTMENT_OTHER): Payer: 59 | Admitting: Hematology & Oncology

## 2016-07-07 ENCOUNTER — Other Ambulatory Visit (HOSPITAL_BASED_OUTPATIENT_CLINIC_OR_DEPARTMENT_OTHER): Payer: 59

## 2016-07-07 VITALS — BP 134/66 | HR 66 | Temp 97.5°F | Resp 18 | Ht 60.0 in | Wt 220.0 lb

## 2016-07-07 DIAGNOSIS — C9001 Multiple myeloma in remission: Secondary | ICD-10-CM

## 2016-07-07 DIAGNOSIS — Z9484 Stem cells transplant status: Secondary | ICD-10-CM | POA: Diagnosis not present

## 2016-07-07 LAB — COMPREHENSIVE METABOLIC PANEL
ALBUMIN: 3.7 g/dL (ref 3.5–5.0)
ALK PHOS: 50 U/L (ref 40–150)
ALT: 23 U/L (ref 0–55)
AST: 19 U/L (ref 5–34)
Anion Gap: 11 mEq/L (ref 3–11)
BUN: 27.7 mg/dL — AB (ref 7.0–26.0)
CO2: 22 meq/L (ref 22–29)
Calcium: 9.3 mg/dL (ref 8.4–10.4)
Chloride: 107 mEq/L (ref 98–109)
Creatinine: 1.6 mg/dL — ABNORMAL HIGH (ref 0.6–1.1)
EGFR: 34 mL/min/{1.73_m2} — AB (ref >90)
GLUCOSE: 98 mg/dL (ref 70–140)
POTASSIUM: 4.1 meq/L (ref 3.5–5.1)
SODIUM: 140 meq/L (ref 136–145)
TOTAL PROTEIN: 7.2 g/dL (ref 6.4–8.3)
Total Bilirubin: 0.52 mg/dL (ref 0.20–1.20)

## 2016-07-07 LAB — CBC WITH DIFFERENTIAL (CANCER CENTER ONLY)
BASO#: 0 10*3/uL (ref 0.0–0.2)
BASO%: 0.2 % (ref 0.0–2.0)
EOS%: 1.9 % (ref 0.0–7.0)
Eosinophils Absolute: 0.1 10*3/uL (ref 0.0–0.5)
HEMATOCRIT: 42.1 % (ref 34.8–46.6)
HEMOGLOBIN: 14 g/dL (ref 11.6–15.9)
LYMPH#: 3 10*3/uL (ref 0.9–3.3)
LYMPH%: 46.1 % (ref 14.0–48.0)
MCH: 33.3 pg (ref 26.0–34.0)
MCHC: 33.3 g/dL (ref 32.0–36.0)
MCV: 100 fL (ref 81–101)
MONO#: 0.4 10*3/uL (ref 0.1–0.9)
MONO%: 5.4 % (ref 0.0–13.0)
NEUT%: 46.4 % (ref 39.6–80.0)
NEUTROS ABS: 3 10*3/uL (ref 1.5–6.5)
Platelets: 197 10*3/uL (ref 145–400)
RBC: 4.2 10*6/uL (ref 3.70–5.32)
RDW: 12.7 % (ref 11.1–15.7)
WBC: 6.5 10*3/uL (ref 3.9–10.0)

## 2016-07-07 NOTE — Progress Notes (Signed)
Hematology and Oncology Follow Up Visit  Marcia [name] [id number] Jan 24, 1951 65 y.o. 07/07/2016   Principle Diagnosis:  Kappa light chain myeloma-remission  Current Therapy:   Observation   Interim History:  Marcia [name] is  back for followup. She has been doing well. Her last saw her back in February. She has had no issues with nausea or vomiting. She is urinating well. She really does not see the nephrologist all that often. Her renal function has been stabilized.  Only last saw her, her Kappa Light chain was 4.1 mg/dL.  She did be a brother that she does not see for 20 years. This was a tough meeting for her.  Her faith remains very strong.   She has had no issues with rashes. She's had no cough or shortness of breath. She's had no change in bowel or bladder habits.   Her last mammogram was a year ago.   Overall, her performance status is ECOG 0.  Medications:  Current Outpatient Prescriptions:  .  Acetaminophen (TYLENOL EXTRA STRENGTH) 167 MG/5ML LIQD, Take by mouth as needed., Disp: , Rfl:  .  aspirin EC 81 MG tablet, Take 81 mg by mouth daily., Disp: , Rfl:  .  lisinopril (PRINIVIL,ZESTRIL) 10 MG tablet, Take 10 mg by mouth daily., Disp: , Rfl: 3 .  Multiple Vitamin (MULTIVITAMIN) capsule, Take 1 capsule by mouth daily., Disp: , Rfl:   Allergies:  Allergies  Allergen Reactions  . Other     Patient states she had N&V with Diarrhea from an antibiotic that begins with a Bioxin    Past Medical History, Surgical history, Social history, and Family History were reviewed and updated.  Review of Systems:  as above  Physical Exam:  height is 5' (1.524 m) and weight is 220 lb (99.8 kg). Her oral temperature is 97.5 F (36.4 C). Her blood pressure is 134/66 and her pulse is 66. Her respiration is 18.    Well-developed and well-nourished white female. She is mildly obese. Head and neck exam shows no ocular or oral lesion. There are no palpable cervical or supraclavicular  lymph nodes. Lungs are clear. Cardiac exam regular rate and rhythm with no murmurs rubs or bruits. Abdomen is soft. Has good bowel sounds. There is no fluid wave. She is mildly obese. There is no palpable liver or spleen tip. Back exam shows no tenderness over the spine ribs or hips. Extremities shows no clubbing cyanosis or edema. Neurological exam shows no focal neurological deficits.Skin exam shows no rashes, ecchymoses or petechia.  Lab Results  Component Value Date   WBC 6.5 07/07/2016   HGB 14.0 07/07/2016   HCT 42.1 07/07/2016   MCV 100 07/07/2016   PLT 197 07/07/2016     Chemistry      Component Value Date/Time   NA 140 01/07/2016 1413   NA 144 07/02/2015 0845   K 4.0 01/07/2016 1413   K 4.6 07/02/2015 0845   CL 109 (H) 01/07/2016 1413   CL 104 07/02/2015 0845   CO2 18 01/07/2016 1413   CO2 29 07/02/2015 0845   BUN 21 01/07/2016 1413   BUN 23 (H) 07/02/2015 0845   CREATININE 1.29 (H) 01/07/2016 1413   CREATININE 1.6 (H) 07/02/2015 0845      Component Value Date/Time   CALCIUM 9.0 01/07/2016 1413   CALCIUM 9.7 07/02/2015 0845   ALKPHOS 61 01/07/2016 1413   ALKPHOS 36 07/02/2015 0845   AST 16 01/07/2016 1413   AST 24 07/02/2015  0845   ALT 17 01/07/2016 1413   ALT 21 07/02/2015 0845   BILITOT 0.4 01/07/2016 1413   BILITOT 0.80 07/02/2015 0845         Impression and Plan: Marcia [name] is 65 year old white female with kappa light chain myeloma. She presented back in April of 2011. She did go into remission. She then underwent a stem cell transplant at [facility] in September of 2011.  She continues to do well. There is no evidence of recurrent myeloma.  I am happy that the myeloma has not recurred.  She is really doing well. I just wish she would lose a little more weight.She said that she will lose weight will receive her next visit.   We will plan to see her back in 6 more months.   [name], MD 8/14/20178:25 AM

## 2016-07-08 LAB — KAPPA/LAMBDA LIGHT CHAINS
IG LAMBDA FREE LIGHT CHAIN: 27.1 mg/L — AB (ref 5.7–26.3)
Ig Kappa Free Light Chain: 31.9 mg/L — ABNORMAL HIGH (ref 3.3–19.4)
KAPPA/LAMBDA FLC RATIO: 1.18 (ref 0.26–1.65)

## 2016-07-08 LAB — IGG, IGA, IGM
IGA/IMMUNOGLOBULIN A, SERUM: 156 mg/dL (ref 87–352)
IGG (IMMUNOGLOBIN G), SERUM: 807 mg/dL (ref 700–1600)
IGM (IMMUNOGLOBIN M), SRM: 110 mg/dL (ref 26–217)

## 2016-07-09 LAB — PROTEIN ELECTROPHORESIS, SERUM, WITH REFLEX
A/G Ratio: 1.2 (ref 0.7–1.7)
ALBUMIN: 3.8 g/dL (ref 2.9–4.4)
ALPHA 1: 0.1 g/dL (ref 0.0–0.4)
Alpha 2: 0.8 g/dL (ref 0.4–1.0)
Beta: 1.2 g/dL (ref 0.7–1.3)
GAMMA GLOBULIN: 0.9 g/dL (ref 0.4–1.8)
Globulin, Total: 3.1 g/dL (ref 2.2–3.9)
TOTAL PROTEIN: 6.9 g/dL (ref 6.0–8.5)

## 2016-07-10 ENCOUNTER — Telehealth: Payer: Self-pay | Admitting: *Deleted

## 2016-07-10 NOTE — Telephone Encounter (Signed)
-----   Message from [name], MD sent at 07/09/2016  5:24 PM EDT ----- Call - NO myeloma noted!!!  Marcia [name]

## 2016-10-19 ENCOUNTER — Encounter (HOSPITAL_COMMUNITY): Payer: Self-pay

## 2016-10-19 ENCOUNTER — Emergency Department (HOSPITAL_COMMUNITY)
Admission: EM | Admit: 2016-10-19 | Discharge: 2016-10-19 | Disposition: A | Discharge status: Home / Self Care | Payer: 59 | Attending: Emergency Medicine | Admitting: Emergency Medicine

## 2016-10-19 DIAGNOSIS — R2242 Localized swelling, mass and lump, left lower limb: Secondary | ICD-10-CM | POA: Diagnosis not present

## 2016-10-19 DIAGNOSIS — M79675 Pain in left toe(s): Secondary | ICD-10-CM

## 2016-10-19 DIAGNOSIS — Z7982 Long term (current) use of aspirin: Secondary | ICD-10-CM | POA: Insufficient documentation

## 2016-10-19 DIAGNOSIS — Z79899 Other long term (current) drug therapy: Secondary | ICD-10-CM | POA: Diagnosis not present

## 2016-10-19 DIAGNOSIS — I10 Essential (primary) hypertension: Secondary | ICD-10-CM | POA: Insufficient documentation

## 2016-10-19 DIAGNOSIS — M7989 Other specified soft tissue disorders: Secondary | ICD-10-CM

## 2016-10-19 DIAGNOSIS — L0889 Other specified local infections of the skin and subcutaneous tissue: Secondary | ICD-10-CM | POA: Diagnosis present

## 2016-10-19 HISTORY — DX: Malignant (primary) neoplasm, unspecified: C80.1

## 2016-10-19 HISTORY — DX: Essential (primary) hypertension: I10

## 2016-10-19 LAB — COMPREHENSIVE METABOLIC PANEL
ALT: 31 U/L (ref 14–54)
ANION GAP: 10 (ref 5–15)
AST: 29 U/L (ref 15–41)
Albumin: 3.7 g/dL (ref 3.5–5.0)
Alkaline Phosphatase: 72 U/L (ref 38–126)
BUN: 22 mg/dL — ABNORMAL HIGH (ref 6–20)
CHLORIDE: 99 mmol/L — AB (ref 101–111)
CO2: 23 mmol/L (ref 22–32)
Calcium: 9.2 mg/dL (ref 8.9–10.3)
Creatinine, Ser: 1.45 mg/dL — ABNORMAL HIGH (ref 0.44–1.00)
GFR calc non Af Amer: 36 mL/min — ABNORMAL LOW (ref >60)
GFR, EST AFRICAN AMERICAN: 42 mL/min — AB (ref >60)
Glucose, Bld: 94 mg/dL (ref 65–99)
Potassium: 3.8 mmol/L (ref 3.5–5.1)
SODIUM: 132 mmol/L — AB (ref 135–145)
Total Bilirubin: 0.8 mg/dL (ref 0.3–1.2)
Total Protein: 6.8 g/dL (ref 6.5–8.1)

## 2016-10-19 LAB — CBC WITH DIFFERENTIAL/PLATELET
Basophils Absolute: 0 10*3/uL (ref 0.0–0.1)
Basophils Relative: 0 %
EOS ABS: 0.3 10*3/uL (ref 0.0–0.7)
EOS PCT: 4 %
HCT: 41 % (ref 36.0–46.0)
Hemoglobin: 13.6 g/dL (ref 12.0–15.0)
LYMPHS ABS: 3.4 10*3/uL (ref 0.7–4.0)
Lymphocytes Relative: 39 %
MCH: 31.9 pg (ref 26.0–34.0)
MCHC: 33.2 g/dL (ref 30.0–36.0)
MCV: 96.2 fL (ref 78.0–100.0)
MONOS PCT: 5 %
Monocytes Absolute: 0.4 10*3/uL (ref 0.1–1.0)
Neutro Abs: 4.5 10*3/uL (ref 1.7–7.7)
Neutrophils Relative %: 52 %
PLATELETS: 181 10*3/uL (ref 150–400)
RBC: 4.26 MIL/uL (ref 3.87–5.11)
RDW: 13 % (ref 11.5–15.5)
WBC: 8.7 10*3/uL (ref 4.0–10.5)

## 2016-10-19 LAB — URIC ACID: Uric Acid, Serum: 8.6 mg/dL — ABNORMAL HIGH (ref 2.3–6.6)

## 2016-10-19 MED ORDER — INDOMETHACIN 25 MG PO CAPS: 25.0000 mg | ORAL_CAPSULE | Freq: Three times a day (TID) | ORAL | 0 refills | Status: DC | PRN

## 2016-10-19 MED ORDER — VANCOMYCIN HCL IN DEXTROSE 1-5 GM/200ML-% IV SOLN
1000.0000 mg | Freq: Once | INTRAVENOUS | Status: AC
  Administered 2016-10-19: 1000 mg via INTRAVENOUS
  Filled 2016-10-19: qty 200

## 2016-10-19 MED ORDER — MORPHINE SULFATE (PF) 4 MG/ML IV SOLN
4.0000 mg | Freq: Once | INTRAVENOUS | Status: AC
  Administered 2016-10-19: 4 mg via INTRAVENOUS
  Filled 2016-10-19: qty 1

## 2016-10-19 MED ORDER — KETOROLAC TROMETHAMINE 30 MG/ML IJ SOLN
30.0000 mg | Freq: Once | INTRAMUSCULAR | Status: AC
  Administered 2016-10-19: 30 mg via INTRAVENOUS
  Filled 2016-10-19: qty 1

## 2016-10-19 NOTE — ED Provider Notes (Signed)
[facility] DEPT Provider Note   CSN: [id number] Arrival date & time: 10/19/16  1026     History   Chief Complaint Chief Complaint  Patient presents with  . left foot redness    HPI Marcia [name] is a 65 y.o. female.  HPI Patient presents with left foot redness and pain for the past 5 days. Was seen 4 days ago and started on Keflex for cellulitis. States the redness and pain has worsened. Denies any fever or chills. Denies myalgias or fatigue. No shortness of breath or chest pain. No known injury to the foot. Past Medical History:  Diagnosis Date  . Cancer ([place])   . Hypertension     Patient Active Problem List   Diagnosis Date Noted  . Multiple myeloma in remission ([place]) 06/02/2013  . Essential hypertension 07/30/2007    History reviewed. No pertinent surgical history.  OB History    No data available       Home Medications    Prior to Admission medications   Medication Sig Start Date End Date Taking? Authorizing Provider  aspirin EC 81 MG tablet Take 81 mg by mouth daily.   Yes Historical Provider, MD  cephALEXin (KEFLEX) 500 MG capsule Take 500 mg by mouth 3 (three) times daily. 10/16/16 10/25/16 Yes Historical Provider, MD  HYDROcodone-acetaminophen (NORCO/VICODIN) 5-325 MG tablet Take 1 tablet by mouth every 6 (six) hours as needed for moderate pain.   Yes Historical Provider, MD  lisinopril (PRINIVIL,ZESTRIL) 10 MG tablet Take 10 mg by mouth daily. 05/05/15  Yes Historical Provider, MD  Multiple Vitamin (MULTIVITAMIN) capsule Take 1 capsule by mouth daily.   Yes Historical Provider, MD  indomethacin (INDOCIN) 25 MG capsule Take 1 capsule (25 mg total) by mouth 3 (three) times daily as needed. 10/19/16   Julianne Rice, MD    Family History No family history on file.  Social History Social History  Substance Use Topics  . Smoking status: Never Smoker  . Smokeless tobacco: Never Used     Comment: never used product  . Alcohol use Not on file      Allergies   Clarithromycin   Review of Systems Review of Systems  Constitutional: Negative for chills, fatigue and fever.  Respiratory: Negative for cough and shortness of breath.   Cardiovascular: Positive for leg swelling. Negative for chest pain and palpitations.  Gastrointestinal: Negative for abdominal pain, diarrhea, nausea and vomiting.  Musculoskeletal: Negative for back pain, myalgias, neck pain and neck stiffness.  Skin: Positive for color change.  Neurological: Negative for dizziness, weakness and numbness.  All other systems reviewed and are negative.    Physical Exam Updated Vital Signs BP 115/75   Pulse 74   Temp 97.7 F (36.5 C) (Oral)   Resp 14   SpO2 97%   Physical Exam  Constitutional: She is oriented to person, place, and time. She appears well-developed and well-nourished. No distress.  HENT:  Head: Normocephalic and atraumatic.  Mouth/Throat: Oropharynx is clear and moist.  Eyes: EOM are normal. Pupils are equal, round, and reactive to light.  Neck: Normal range of motion. Neck supple.  Cardiovascular: Normal rate and regular rhythm.   Pulmonary/Chest: Effort normal and breath sounds normal.  Abdominal: Soft. Bowel sounds are normal. There is no tenderness. There is no rebound and no guarding.  Musculoskeletal: Normal range of motion. She exhibits edema and tenderness.  Patient with diffuse tenderness over the lateral dorsal surface of her left foot extending to the left ankle. There  is mild swelling with erythema. No masses or fluctuance. 2+ dorsalis pedis pulses and good distal cap refill. No calf swelling, asymmetry or tenderness.  Neurological: She is alert and oriented to person, place, and time.  5/5 motor extremities. Sensation is fully intact.  Skin: Skin is warm and dry. No rash noted. No erythema.  Psychiatric: She has a normal mood and affect. Her behavior is normal.  Nursing note and vitals reviewed.    ED Treatments / Results   Labs (all labs ordered are listed, but only abnormal results are displayed) Labs Reviewed  COMPREHENSIVE METABOLIC PANEL - Abnormal; Notable for the following:       Result Value   Sodium 132 (*)    Chloride 99 (*)    BUN 22 (*)    Creatinine, Ser 1.45 (*)    GFR calc non Af Amer 36 (*)    GFR calc Af Amer 42 (*)    All other components within normal limits  URIC ACID - Abnormal; Notable for the following:    Uric Acid, Serum 8.6 (*)    All other components within normal limits  CBC WITH DIFFERENTIAL/PLATELET    EKG  EKG Interpretation None       Radiology No results found.  Procedures Procedures (including critical care time)  Medications Ordered in ED Medications  vancomycin (VANCOCIN) IVPB 1000 mg/200 mL premix (1,000 mg Intravenous New Bag/Given 10/19/16 1229)  morphine 4 MG/ML injection 4 mg (4 mg Intravenous Given 10/19/16 1322)  ketorolac (TORADOL) 30 MG/ML injection 30 mg (30 mg Intravenous Given 10/19/16 1336)     Initial Impression / Assessment and Plan / ED Course  I have reviewed the triage vital signs and the nursing notes.  Pertinent labs & imaging results that were available during my care of the patient were reviewed by me and considered in my medical decision making (see chart for details).  Clinical Course    Patient has elevated uric acid level. Normal white blood cell count. No constitutional signs or symptoms. Suspect likely gout.  Cellulitis is definitely on the differential. She received a dose of IV antibiotics in the emergency department and the redness was marked. Will start on anti-inflammatory and have return to the emergency department for reevaluation in 24 hours.   Final Clinical Impressions(s) / ED Diagnoses   Final diagnoses:  Pain and swelling of toe of left foot    New Prescriptions New Prescriptions   INDOMETHACIN (INDOCIN) 25 MG CAPSULE    Take 1 capsule (25 mg total) by mouth 3 (three) times daily as needed.     Julianne Rice, MD 10/19/16 1410

## 2016-10-19 NOTE — ED Triage Notes (Signed)
Patient here with left foot redness since Thursday, denies trauma. States that she was seen on Thursday in [place] for same and had negative xray and taking keflex for possible infection. Now has increased pain to foot. Redness with minimal swelling

## 2016-10-19 NOTE — ED Notes (Signed)
Amount of swelling documented with skin marker.

## 2016-10-21 ENCOUNTER — Emergency Department (HOSPITAL_COMMUNITY)
Admission: EM | Admit: 2016-10-21 | Discharge: 2016-10-21 | Disposition: A | Discharge status: Home / Self Care | Payer: 59 | Attending: Emergency Medicine | Admitting: Emergency Medicine

## 2016-10-21 ENCOUNTER — Encounter (HOSPITAL_COMMUNITY): Payer: Self-pay

## 2016-10-21 DIAGNOSIS — Z7982 Long term (current) use of aspirin: Secondary | ICD-10-CM | POA: Diagnosis not present

## 2016-10-21 DIAGNOSIS — I10 Essential (primary) hypertension: Secondary | ICD-10-CM | POA: Diagnosis not present

## 2016-10-21 DIAGNOSIS — Z79899 Other long term (current) drug therapy: Secondary | ICD-10-CM | POA: Diagnosis not present

## 2016-10-21 DIAGNOSIS — L03116 Cellulitis of left lower limb: Secondary | ICD-10-CM | POA: Diagnosis not present

## 2016-10-21 DIAGNOSIS — Z859 Personal history of malignant neoplasm, unspecified: Secondary | ICD-10-CM | POA: Diagnosis not present

## 2016-10-21 DIAGNOSIS — M79672 Pain in left foot: Secondary | ICD-10-CM | POA: Diagnosis present

## 2016-10-21 MED ORDER — CEPHALEXIN 500 MG PO CAPS: 500.0000 mg | ORAL_CAPSULE | Freq: Four times a day (QID) | ORAL | 0 refills | Status: AC

## 2016-10-21 MED ORDER — SULFAMETHOXAZOLE-TRIMETHOPRIM 800-160 MG PO TABS: 1.0000 | ORAL_TABLET | Freq: Two times a day (BID) | ORAL | 0 refills | Status: AC

## 2016-10-21 MED ORDER — CEPHALEXIN 250 MG PO CAPS
500.0000 mg | ORAL_CAPSULE | Freq: Once | ORAL | Status: AC
  Administered 2016-10-21: 500 mg via ORAL
  Filled 2016-10-21: qty 2

## 2016-10-21 MED ORDER — SULFAMETHOXAZOLE-TRIMETHOPRIM 800-160 MG PO TABS
1.0000 | ORAL_TABLET | Freq: Once | ORAL | Status: AC
  Administered 2016-10-21: 1 via ORAL
  Filled 2016-10-21: qty 1

## 2016-10-21 NOTE — Discharge Instructions (Signed)
PLEASE CHANGE THE KEFLEX TO 4 TIMES A DAY FOR 7 DAYS. I have prescribed the access of keflex that you will need.  RETURN TO THE ER IF THERE IS INCREASED PAIN, REDNESS, PUS COMING OUT from the wound site.

## 2016-10-21 NOTE — ED Provider Notes (Signed)
[facility] DEPT Provider Note   CSN: [id number] Arrival date & time: 10/21/16  3419     History   Chief Complaint Chief Complaint  Patient presents with  . Foot Pain    HPI Marcia [name] is a 65 y.o. female.  HPI Pt comes in with cc of foot pain. Pt had redness and swelling to the L foot, saw ER in [place] and was started on keflex. Pt's swelling went up, so she came to the ER. Pt's uric acid was elevated, so it was thought that she might have gout. Pt returns for wound check. She has been taking naprosyn. She reports that the swelling has decreased, redness has remained, and pain is down from 6/10->4/10. Pt is ambulating, and has no fevers, chills. Has pain when going in and out of car. Pt has no DM, no meds that weaken her immune system.  Past Medical History:  Diagnosis Date  . Cancer ([place])   . Hypertension     Patient Active Problem List   Diagnosis Date Noted  . Multiple myeloma in remission ([place]) 06/02/2013  . Essential hypertension 07/30/2007    History reviewed. No pertinent surgical history.  OB History    No data available       Home Medications    Prior to Admission medications   Medication Sig Start Date End Date Taking? Authorizing Provider  aspirin EC 81 MG tablet Take 81 mg by mouth daily.    Historical Provider, MD  cephALEXin (KEFLEX) 500 MG capsule Take 1 capsule (500 mg total) by mouth 4 (four) times daily. 10/21/16 10/30/16  Varney Biles, MD  HYDROcodone-acetaminophen (NORCO/VICODIN) 5-325 MG tablet Take 1 tablet by mouth every 6 (six) hours as needed for moderate pain.    Historical Provider, MD  indomethacin (INDOCIN) 25 MG capsule Take 1 capsule (25 mg total) by mouth 3 (three) times daily as needed. 10/19/16   Julianne Rice, MD  lisinopril (PRINIVIL,ZESTRIL) 10 MG tablet Take 10 mg by mouth daily. 05/05/15   Historical Provider, MD  Multiple Vitamin (MULTIVITAMIN) capsule Take 1 capsule by mouth daily.    Historical Provider, MD    sulfamethoxazole-trimethoprim (BACTRIM DS,SEPTRA DS) 800-160 MG tablet Take 1 tablet by mouth 2 (two) times daily. 10/21/16 10/28/16  Varney Biles, MD    Family History No family history on file.  Social History Social History  Substance Use Topics  . Smoking status: Never Smoker  . Smokeless tobacco: Never Used     Comment: never used product  . Alcohol use Not on file     Allergies   Clarithromycin   Review of Systems Review of Systems  ROS 10 Systems reviewed and are negative for acute change except as noted in the HPI.     Physical Exam Updated Vital Signs BP 143/70 (BP Location: Right Arm)   Pulse 63   Temp 97.9 F (36.6 C) (Oral)   Ht 5' (1.524 m)   Wt 220 lb (99.8 kg)   SpO2 98%   BMI 42.97 kg/m   Physical Exam  Constitutional: She is oriented to person, place, and time. She appears well-developed.  HENT:  Head: Normocephalic and atraumatic.  Eyes: EOM are normal.  Neck: Normal range of motion. Neck supple.  Cardiovascular: Normal rate.   Pulmonary/Chest: Effort normal.  Abdominal: Bowel sounds are normal.  Neurological: She is alert and oriented to person, place, and time.  Skin: Skin is warm and dry. Rash noted. There is erythema.  Nursing note and vitals  reviewed.         ED Treatments / Results  Labs (all labs ordered are listed, but only abnormal results are displayed) Labs Reviewed - No data to display  EKG  EKG Interpretation None       Radiology No results found.  Procedures Procedures (including critical care time)  Medications Ordered in ED Medications  sulfamethoxazole-trimethoprim (BACTRIM DS,SEPTRA DS) 800-160 MG per tablet 1 tablet (not administered)  cephALEXin (KEFLEX) capsule 500 mg (not administered)     Initial Impression / Assessment and Plan / ED Course  I have reviewed the triage vital signs and the nursing notes.  Pertinent labs & imaging results that were available during my care of the patient  were reviewed by me and considered in my medical decision making (see chart for details).  Clinical Course    Pt with persistent foot redness and pain. Swelling improved - but she is on anti-inflammatory. There is no joint tenderness -ankle or toes and the ROM over those joints is not restricted, thus I personally dont think she has gouty arthritis. Pt was on keflex TID - we will increase it to QID and add bactrim. PCP f/u in 1 week. Return to the Er if sx get worse.    Final Clinical Impressions(s) / ED Diagnoses   Final diagnoses:  Cellulitis of left lower extremity    New Prescriptions New Prescriptions   SULFAMETHOXAZOLE-TRIMETHOPRIM (BACTRIM DS,SEPTRA DS) 800-160 MG TABLET    Take 1 tablet by mouth 2 (two) times daily.     Varney Biles, MD 10/21/16 [phone]

## 2016-11-19 ENCOUNTER — Other Ambulatory Visit: Payer: Self-pay | Admitting: Obstetrics and Gynecology

## 2016-11-19 DIAGNOSIS — R928 Other abnormal and inconclusive findings on diagnostic imaging of breast: Secondary | ICD-10-CM

## 2016-12-01 ENCOUNTER — Ambulatory Visit
Admission: RE | Admit: 2016-12-01 | Discharge: 2016-12-01 | Disposition: A | Discharge status: Home / Self Care | Payer: Medicare Other | Source: Ambulatory Visit | Attending: Obstetrics and Gynecology | Admitting: Obstetrics and Gynecology

## 2016-12-01 ENCOUNTER — Ambulatory Visit
Admission: RE | Admit: 2016-12-01 | Discharge: 2016-12-01 | Disposition: A | Discharge status: Home / Self Care | Payer: 59 | Source: Ambulatory Visit | Attending: Obstetrics and Gynecology | Admitting: Obstetrics and Gynecology

## 2016-12-01 ENCOUNTER — Other Ambulatory Visit: Payer: Self-pay | Admitting: Obstetrics and Gynecology

## 2016-12-01 DIAGNOSIS — R928 Other abnormal and inconclusive findings on diagnostic imaging of breast: Secondary | ICD-10-CM

## 2017-01-05 ENCOUNTER — Other Ambulatory Visit (HOSPITAL_BASED_OUTPATIENT_CLINIC_OR_DEPARTMENT_OTHER): Payer: 59

## 2017-01-05 ENCOUNTER — Ambulatory Visit (HOSPITAL_BASED_OUTPATIENT_CLINIC_OR_DEPARTMENT_OTHER): Payer: 59 | Admitting: Hematology & Oncology

## 2017-01-05 VITALS — BP 119/68 | HR 66 | Temp 98.0°F | Wt 220.0 lb

## 2017-01-05 DIAGNOSIS — Z9484 Stem cells transplant status: Secondary | ICD-10-CM

## 2017-01-05 DIAGNOSIS — C9001 Multiple myeloma in remission: Secondary | ICD-10-CM

## 2017-01-05 LAB — CBC WITH DIFFERENTIAL (CANCER CENTER ONLY)
BASO#: 0 10*3/uL (ref 0.0–0.2)
BASO%: 0.4 % (ref 0.0–2.0)
EOS%: 6.2 % (ref 0.0–7.0)
Eosinophils Absolute: 0.5 10*3/uL (ref 0.0–0.5)
HEMATOCRIT: 43.5 % (ref 34.8–46.6)
HEMOGLOBIN: 14.2 g/dL (ref 11.6–15.9)
LYMPH#: 3.3 10*3/uL (ref 0.9–3.3)
LYMPH%: 40.9 % (ref 14.0–48.0)
MCH: 32.5 pg (ref 26.0–34.0)
MCHC: 32.6 g/dL (ref 32.0–36.0)
MCV: 100 fL (ref 81–101)
MONO#: 0.4 10*3/uL (ref 0.1–0.9)
MONO%: 5.3 % (ref 0.0–13.0)
NEUT%: 47.2 % (ref 39.6–80.0)
NEUTROS ABS: 3.8 10*3/uL (ref 1.5–6.5)
Platelets: 192 10*3/uL (ref 145–400)
RBC: 4.37 10*6/uL (ref 3.70–5.32)
RDW: 13.1 % (ref 11.1–15.7)
WBC: 8.1 10*3/uL (ref 3.9–10.0)

## 2017-01-05 LAB — COMPREHENSIVE METABOLIC PANEL
ALBUMIN: 3.8 g/dL (ref 3.5–5.0)
ALK PHOS: 52 U/L (ref 40–150)
ALT: 13 U/L (ref 0–55)
AST: 14 U/L (ref 5–34)
Anion Gap: 10 mEq/L (ref 3–11)
BUN: 22.4 mg/dL (ref 7.0–26.0)
CALCIUM: 9.3 mg/dL (ref 8.4–10.4)
CHLORIDE: 109 meq/L (ref 98–109)
CO2: 23 mEq/L (ref 22–29)
CREATININE: 1.4 mg/dL — AB (ref 0.6–1.1)
EGFR: 40 mL/min/{1.73_m2} — ABNORMAL LOW (ref >90)
GLUCOSE: 97 mg/dL (ref 70–140)
POTASSIUM: 4.3 meq/L (ref 3.5–5.1)
SODIUM: 141 meq/L (ref 136–145)
Total Bilirubin: 0.56 mg/dL (ref 0.20–1.20)
Total Protein: 7.2 g/dL (ref 6.4–8.3)

## 2017-01-05 NOTE — Progress Notes (Signed)
Hematology and Oncology Follow Up Visit  Marcia [name] [id number] [birth year] 66 y.o. 01/05/2017   Principle Diagnosis:  Kappa light chain myeloma-remission  Current Therapy:   Observation   Interim History:  Ms.  [name] is  back for followup. She has been doing well. We last saw her back in August.   She did well over the holidays. So far, she's had no problems with the flu. She did have the flu vaccine.  We last saw her back in August, her Kappa light chain was 3.2 mg/dL.   Her big problem is weight. She has not lost any weight. I think she really has to try to lose some weight. If not, this will begin to cause problems for her.   She had a mammogram done back in early January. This showed a probable benign nodule in the right breast. It is recommended had a mammogram and ultrasound be done in 6 months.  Overall, her performance status is ECOG 0.  Medications:  Current Outpatient Prescriptions:  .  pravastatin (PRAVACHOL) 40 MG tablet, Take 40 mg by mouth daily., Disp: , Rfl:  .  aspirin EC 81 MG tablet, Take 81 mg by mouth daily., Disp: , Rfl:  .  HYDROcodone-acetaminophen (NORCO/VICODIN) 5-325 MG tablet, Take 1 tablet by mouth every 6 (six) hours as needed for moderate pain., Disp: , Rfl:  .  indomethacin (INDOCIN) 25 MG capsule, Take 1 capsule (25 mg total) by mouth 3 (three) times daily as needed., Disp: 30 capsule, Rfl: 0 .  lisinopril (PRINIVIL,ZESTRIL) 10 MG tablet, Take 10 mg by mouth daily., Disp: , Rfl: 3 .  Multiple Vitamin (MULTIVITAMIN) capsule, Take 1 capsule by mouth daily., Disp: , Rfl:   Allergies:  Allergies  Allergen Reactions  . Clarithromycin Diarrhea and Nausea And Vomiting    REACTION: unspecified    Past Medical History, Surgical history, Social history, and Family History were reviewed and updated.  Review of Systems:  as above  Physical Exam:  weight is 220 lb (99.8 kg). Her oral temperature is 98 F (36.7 C). Her blood pressure is 119/68  and her pulse is 66.    Well-developed and well-nourished white female. She is mildly obese. Head and neck exam shows no ocular or oral lesion. There are no palpable cervical or supraclavicular lymph nodes. Lungs are clear. Cardiac exam regular rate and rhythm with no murmurs rubs or bruits. Abdomen is soft. Has good bowel sounds. There is no fluid wave. She is mildly obese. There is no palpable liver or spleen tip. Back exam shows no tenderness over the spine ribs or hips. Extremities shows no clubbing cyanosis or edema. Neurological exam shows no focal neurological deficits.Skin exam shows no rashes, ecchymoses or petechia.  Lab Results  Component Value Date   WBC 8.1 01/05/2017   HGB 14.2 01/05/2017   HCT 43.5 01/05/2017   MCV 100 01/05/2017   PLT 192 01/05/2017     Chemistry      Component Value Date/Time   NA 132 (L) 10/19/2016 1124   NA 140 07/07/2016 0749   K 3.8 10/19/2016 1124   K 4.1 07/07/2016 0749   CL 99 (L) 10/19/2016 1124   CL 109 (H) 01/07/2016 1413   CL 104 07/02/2015 0845   CO2 23 10/19/2016 1124   CO2 22 07/07/2016 0749   BUN 22 (H) 10/19/2016 1124   BUN 27.7 (H) 07/07/2016 0749   CREATININE 1.45 (H) 10/19/2016 1124   CREATININE 1.6 (H) 07/07/2016 [id number]  Component Value Date/Time   CALCIUM 9.2 10/19/2016 1124   CALCIUM 9.3 07/07/2016 0749   ALKPHOS 72 10/19/2016 1124   ALKPHOS 50 07/07/2016 0749   AST 29 10/19/2016 1124   AST 19 07/07/2016 0749   ALT 31 10/19/2016 1124   ALT 23 07/07/2016 0749   BILITOT 0.8 10/19/2016 1124   BILITOT 0.52 07/07/2016 0749         Impression and Plan: Marcia [name] is 66 year old white female with kappa light chain myeloma. She presented back in April of 2011. She did go into remission. 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  She continues to do well. There is no evidence of recurrent myeloma.  I am happy that the myeloma has not recurred.  We will have to make sure that she has  this mammogram done in 6 months. I want to make sure that we do not overlook anything else medicine wise..  We will plan to see her back in 6 more months.   [name], MD 2/12/201811:11 AM

## 2017-01-06 LAB — KAPPA/LAMBDA LIGHT CHAINS
Ig Kappa Free Light Chain: 34.7 mg/L — ABNORMAL HIGH (ref 3.3–19.4)
Ig Lambda Free Light Chain: 27.7 mg/L — ABNORMAL HIGH (ref 5.7–26.3)
Kappa/Lambda FluidC Ratio: 1.25 (ref 0.26–1.65)

## 2017-01-06 LAB — IGG, IGA, IGM
IGA/IMMUNOGLOBULIN A, SERUM: 175 mg/dL (ref 87–352)
IGG (IMMUNOGLOBIN G), SERUM: 826 mg/dL (ref 700–1600)
IGM (IMMUNOGLOBIN M), SRM: 119 mg/dL (ref 26–217)

## 2017-01-07 LAB — PROTEIN ELECTROPHORESIS, SERUM, WITH REFLEX
A/G Ratio: 1.1 (ref 0.7–1.7)
ALBUMIN: 3.6 g/dL (ref 2.9–4.4)
ALPHA 1: 0.2 g/dL (ref 0.0–0.4)
ALPHA 2: 0.8 g/dL (ref 0.4–1.0)
BETA: 1.2 g/dL (ref 0.7–1.3)
Gamma Globulin: 0.9 g/dL (ref 0.4–1.8)
Globulin, Total: 3.2 g/dL (ref 2.2–3.9)
Total Protein: 6.8 g/dL (ref 6.0–8.5)

## 2017-01-08 ENCOUNTER — Telehealth: Payer: Self-pay | Admitting: *Deleted

## 2017-01-08 NOTE — Telephone Encounter (Addendum)
Patient aware of results  ----- Message from [name], MD sent at 01/07/2017  5:52 PM EST ----- Call and tell her that there is no myeloma in her blood. She is still in remission. Thanks

## 2017-04-22 ENCOUNTER — Encounter: Payer: Self-pay | Admitting: Hematology & Oncology

## 2017-05-14 ENCOUNTER — Other Ambulatory Visit: Payer: Self-pay | Admitting: Obstetrics and Gynecology

## 2017-05-14 DIAGNOSIS — N63 Unspecified lump in unspecified breast: Secondary | ICD-10-CM

## 2017-07-06 ENCOUNTER — Other Ambulatory Visit (HOSPITAL_BASED_OUTPATIENT_CLINIC_OR_DEPARTMENT_OTHER): Payer: 59

## 2017-07-06 ENCOUNTER — Ambulatory Visit (HOSPITAL_BASED_OUTPATIENT_CLINIC_OR_DEPARTMENT_OTHER): Payer: 59 | Admitting: Hematology & Oncology

## 2017-07-06 VITALS — BP 139/76 | HR 57 | Temp 98.5°F | Resp 18 | Wt 213.0 lb

## 2017-07-06 DIAGNOSIS — C9001 Multiple myeloma in remission: Secondary | ICD-10-CM

## 2017-07-06 LAB — CBC WITH DIFFERENTIAL (CANCER CENTER ONLY)
BASO#: 0 10*3/uL (ref 0.0–0.2)
BASO%: 0.1 % (ref 0.0–2.0)
EOS ABS: 0 10*3/uL (ref 0.0–0.5)
EOS%: 0.3 % (ref 0.0–7.0)
HCT: 47 % — ABNORMAL HIGH (ref 34.8–46.6)
HEMOGLOBIN: 15.7 g/dL (ref 11.6–15.9)
LYMPH#: 5.7 10*3/uL — ABNORMAL HIGH (ref 0.9–3.3)
LYMPH%: 44.9 % (ref 14.0–48.0)
MCH: 32.8 pg (ref 26.0–34.0)
MCHC: 33.4 g/dL (ref 32.0–36.0)
MCV: 98 fL (ref 81–101)
MONO#: 0.9 10*3/uL (ref 0.1–0.9)
MONO%: 6.9 % (ref 0.0–13.0)
NEUT%: 47.8 % (ref 39.6–80.0)
NEUTROS ABS: 6.1 10*3/uL (ref 1.5–6.5)
Platelets: 219 10*3/uL (ref 145–400)
RBC: 4.78 10*6/uL (ref 3.70–5.32)
RDW: 13 % (ref 11.1–15.7)
WBC: 12.8 10*3/uL — ABNORMAL HIGH (ref 3.9–10.0)

## 2017-07-06 LAB — CMP (CANCER CENTER ONLY)
ALBUMIN: 4 g/dL (ref 3.3–5.5)
ALT(SGPT): 58 U/L — ABNORMAL HIGH (ref 10–47)
AST: 33 U/L (ref 11–38)
Alkaline Phosphatase: 58 U/L (ref 26–84)
BUN, Bld: 28 mg/dL — ABNORMAL HIGH (ref 7–22)
CALCIUM: 9.1 mg/dL (ref 8.0–10.3)
CHLORIDE: 101 meq/L (ref 98–108)
CO2: 28 meq/L (ref 18–33)
CREATININE: 1.6 mg/dL — AB (ref 0.6–1.2)
Glucose, Bld: 93 mg/dL (ref 73–118)
POTASSIUM: 3.6 meq/L (ref 3.3–4.7)
SODIUM: 141 meq/L (ref 128–145)
TOTAL PROTEIN: 7.7 g/dL (ref 6.4–8.1)
Total Bilirubin: 0.8 mg/dl (ref 0.20–1.60)

## 2017-07-06 LAB — LACTATE DEHYDROGENASE: LDH: 148 U/L (ref 125–245)

## 2017-07-06 NOTE — Progress Notes (Signed)
Hematology and Oncology Follow Up Visit  Marcia [name] [id number] 1951/11/24 66 y.o. 07/06/2017   Principle Diagnosis:  Kappa light chain myeloma-remission  Current Therapy:   Observation    Interim History:  Ms.  Marcia [name] is  back for followup. She has been doing well. We last saw her back in February.  She's done very well. So far, this been no evidence of recurrent light chain myeloma. Back in February, her Kappa Lightchain was 3.5 mg/dL. This is holding very stable.  Since we last saw her, she and her husband went to [place]. He had a great time.  She's had no problem with infections. She's had no rashes 3 she's had gout. She currently is on some prednisone for gout. She is not on allopurinol. I'm sure her family doctor will monitor this.  She's had no bleeding. This been no change in bowel or bladder habits. She's had no mouth sores. She's had no visual changes.  Overall, her performance status is ECOG 0.  Medications:  Current Outpatient Prescriptions:  .  Aspirin (ASPIR-81 PO), Aspir-81, Disp: , Rfl:  .  HYDROcodone-acetaminophen (NORCO/VICODIN) 5-325 MG tablet, Take 1 tablet by mouth every 6 (six) hours as needed for moderate pain., Disp: , Rfl:  .  indomethacin (INDOCIN) 25 MG capsule, Take 1 capsule (25 mg total) by mouth 3 (three) times daily as needed., Disp: 30 capsule, Rfl: 0 .  lisinopril (PRINIVIL,ZESTRIL) 10 MG tablet, Take 10 mg by mouth daily., Disp: , Rfl: 3 .  Multiple Vitamin (MULTIVITAMIN) capsule, Take 1 capsule by mouth daily., Disp: , Rfl:  .  Multiple Vitamins-Minerals (MULTIVITAMIN ADULT EXTRA C PO), multivitamin, Disp: , Rfl:  .  pravastatin (PRAVACHOL) 40 MG tablet, Take 40 mg by mouth daily., Disp: , Rfl:  .  predniSONE (DELTASONE) 10 MG tablet, TAKE 1 TABLET BY MOUTH 3 TIMES DAILY X 3 DAYS, THEN 1 TAB TWICE DAILY X 3DAYS, THEN 1 TAB DAILY, Disp: , Rfl: 0  Allergies:  Allergies  Allergen Reactions  . Clarithromycin Diarrhea and Nausea And  Vomiting    REACTION: unspecified    Past Medical History, Surgical history, Social history, and Family History were reviewed and updated.  Review of Systems:  as above  Physical Exam:  weight is 213 lb (96.6 kg). Her oral temperature is 98.5 F (36.9 C). Her blood pressure is 139/76 and her pulse is 57 (abnormal). Her respiration is 18 and oxygen saturation is 96%.    Well-developed and well-nourished white female. She is mildly obese. Head and neck exam shows no ocular or oral lesion. There are no palpable cervical or supraclavicular lymph nodes. Lungs are clear. Cardiac exam regular rate and rhythm with no murmurs rubs or bruits. Abdomen is soft. Has good bowel sounds. There is no fluid wave. She is mildly obese. There is no palpable liver or spleen tip. Back exam shows no tenderness over the spine ribs or hips. Extremities shows no clubbing cyanosis or edema. Neurological exam shows no focal neurological deficits.Skin exam shows no rashes, ecchymoses or petechia.  Lab Results  Component Value Date   WBC 12.8 (H) 07/06/2017   HGB 15.7 07/06/2017   HCT 47.0 (H) 07/06/2017   MCV 98 07/06/2017   PLT 219 07/06/2017     Chemistry      Component Value Date/Time   NA 141 07/06/2017 0816   NA 141 01/05/2017 0956   K 3.6 07/06/2017 0816   K 4.3 01/05/2017 0956   CL 101 07/06/2017 0816  CO2 28 07/06/2017 0816   CO2 23 01/05/2017 0956   BUN 28 (H) 07/06/2017 0816   BUN 22.4 01/05/2017 0956   CREATININE 1.6 (H) 07/06/2017 0816   CREATININE 1.4 (H) 01/05/2017 0956      Component Value Date/Time   CALCIUM 9.1 07/06/2017 0816   CALCIUM 9.3 01/05/2017 0956   ALKPHOS 58 07/06/2017 0816   ALKPHOS 52 01/05/2017 0956   AST 33 07/06/2017 0816   AST 14 01/05/2017 0956   ALT 58 (H) 07/06/2017 0816   ALT 13 01/05/2017 0956   BILITOT 0.80 07/06/2017 0816   BILITOT 0.56 01/05/2017 0956         Impression and Plan: [name] is 67 year old white female with kappa light chain  myeloma. She presented back in April of 2011. She did go into remission. She then underwent a stem cell transplant at [facility] in September of 2011.  She continues to do well. There is no evidence of recurrent myeloma.  I am happy that the myeloma has not recurred.  We will have to make sure that she has this mammogram done in 6 months. I want to make sure that we do not overlook anything else medicine wise..  We will plan to see her back in 6 more months.   [name], MD 8/13/20189:01 AM

## 2017-07-07 LAB — KAPPA/LAMBDA LIGHT CHAINS
IG KAPPA FREE LIGHT CHAIN: 23.6 mg/L — AB (ref 3.3–19.4)
Ig Lambda Free Light Chain: 21.3 mg/L (ref 5.7–26.3)
Kappa/Lambda FluidC Ratio: 1.11 (ref 0.26–1.65)

## 2017-07-07 LAB — BETA 2 MICROGLOBULIN, SERUM: BETA 2: 2.5 mg/L — AB (ref 0.6–2.4)

## 2017-07-07 LAB — IGG, IGA, IGM
IGA/IMMUNOGLOBULIN A, SERUM: 192 mg/dL (ref 87–352)
IGG (IMMUNOGLOBIN G), SERUM: 934 mg/dL (ref 700–1600)
IGM (IMMUNOGLOBIN M), SRM: 131 mg/dL (ref 26–217)

## 2017-07-08 ENCOUNTER — Telehealth: Payer: Self-pay | Admitting: *Deleted

## 2017-07-08 LAB — PROTEIN ELECTROPHORESIS, SERUM, WITH REFLEX
A/G Ratio: 1.3 (ref 0.7–1.7)
ALBUMIN: 3.9 g/dL (ref 2.9–4.4)
ALPHA 1: 0.2 g/dL (ref 0.0–0.4)
Alpha 2: 0.8 g/dL (ref 0.4–1.0)
Beta: 1.3 g/dL (ref 0.7–1.3)
Gamma Globulin: 0.9 g/dL (ref 0.4–1.8)
Globulin, Total: 3.1 g/dL (ref 2.2–3.9)
TOTAL PROTEIN: 7 g/dL (ref 6.0–8.5)

## 2017-07-08 NOTE — Telephone Encounter (Signed)
-----   Message from [name], MD sent at 07/08/2017  3:42 PM EDT ----- Call - NO myeloma!!!  pete

## 2018-01-04 ENCOUNTER — Encounter: Payer: Self-pay | Admitting: Hematology & Oncology

## 2018-01-04 ENCOUNTER — Inpatient Hospital Stay: Payer: 59 | Attending: Hematology & Oncology

## 2018-01-04 ENCOUNTER — Inpatient Hospital Stay (HOSPITAL_BASED_OUTPATIENT_CLINIC_OR_DEPARTMENT_OTHER): Payer: 59 | Admitting: Hematology & Oncology

## 2018-01-04 ENCOUNTER — Other Ambulatory Visit: Payer: Self-pay

## 2018-01-04 VITALS — BP 135/65 | HR 67 | Temp 98.2°F | Resp 17 | Wt 217.8 lb

## 2018-01-04 DIAGNOSIS — Z9484 Stem cells transplant status: Secondary | ICD-10-CM | POA: Insufficient documentation

## 2018-01-04 DIAGNOSIS — C9001 Multiple myeloma in remission: Secondary | ICD-10-CM

## 2018-01-04 DIAGNOSIS — M109 Gout, unspecified: Secondary | ICD-10-CM | POA: Diagnosis not present

## 2018-01-04 DIAGNOSIS — Z7952 Long term (current) use of systemic steroids: Secondary | ICD-10-CM | POA: Insufficient documentation

## 2018-01-04 LAB — CMP (CANCER CENTER ONLY)
ALK PHOS: 55 U/L (ref 40–150)
ALT: 12 U/L (ref 0–55)
ANION GAP: 10 (ref 3–11)
AST: 14 U/L (ref 5–34)
Albumin: 3.8 g/dL (ref 3.5–5.0)
BUN: 25 mg/dL (ref 7–26)
CALCIUM: 9.1 mg/dL (ref 8.4–10.4)
CO2: 23 mmol/L (ref 22–29)
Chloride: 107 mmol/L (ref 98–109)
Creatinine: 1.37 mg/dL — ABNORMAL HIGH (ref 0.60–1.10)
GFR, EST NON AFRICAN AMERICAN: 39 mL/min — AB (ref >60)
GFR, Est AFR Am: 45 mL/min — ABNORMAL LOW (ref >60)
GLUCOSE: 93 mg/dL (ref 70–140)
POTASSIUM: 4.3 mmol/L (ref 3.5–5.1)
Sodium: 140 mmol/L (ref 136–145)
TOTAL PROTEIN: 7.1 g/dL (ref 6.4–8.3)
Total Bilirubin: 0.6 mg/dL (ref 0.2–1.2)

## 2018-01-04 LAB — CBC WITH DIFFERENTIAL (CANCER CENTER ONLY)
Basophils Absolute: 0 10*3/uL (ref 0.0–0.1)
Basophils Relative: 0 %
Eosinophils Absolute: 0.2 10*3/uL (ref 0.0–0.5)
Eosinophils Relative: 2 %
HCT: 45 % (ref 34.8–46.6)
HEMOGLOBIN: 14.8 g/dL (ref 11.6–15.9)
LYMPHS PCT: 42 %
Lymphs Abs: 3.1 10*3/uL (ref 0.9–3.3)
MCH: 31.9 pg (ref 26.0–34.0)
MCHC: 32.9 g/dL (ref 32.0–36.0)
MCV: 97 fL (ref 81.0–101.0)
MONO ABS: 0.4 10*3/uL (ref 0.1–0.9)
MONOS PCT: 5 %
NEUTROS ABS: 3.8 10*3/uL (ref 1.5–6.5)
NEUTROS PCT: 51 %
Platelet Count: 196 10*3/uL (ref 145–400)
RBC: 4.64 MIL/uL (ref 3.70–5.32)
RDW: 12.7 % (ref 11.1–15.7)
WBC Count: 7.5 10*3/uL (ref 3.9–10.0)

## 2018-01-04 NOTE — Progress Notes (Signed)
Hematology and Oncology Follow Up Visit  Marcia [name] [id number] October 08, 1951 67 y.o. 01/04/2018   Principle Diagnosis:  Kappa light chain myeloma-remission  Current Therapy:   Observation    Interim History:  Marcia [name] is  back for followup. She has been doing well. We last saw her back in August  She's done very well. So far, this been no evidence of recurrent light chain myeloma. Back in August, her Kappa Lightchain was 2.4 mg/dL. This is holding very stable.  Since we last saw her, she and her husband went to [place]. He had a great time.  She's had no problem with infections. She's had no rashes 3 she's had gout. She currently is on some prednisone for gout. She is not on allopurinol. I'm sure her family doctor will monitor this.  She's had no bleeding. This been no change in bowel or bladder habits. She's had no mouth sores. She's had no visual changes.  Overall, her performance status is ECOG 0.  Medications:  Current Outpatient Medications:  .  losartan (COZAAR) 25 MG tablet, Take 25 mg by mouth daily., Disp: , Rfl:   Allergies:  Allergies  Allergen Reactions  . Clarithromycin Diarrhea and Nausea And Vomiting    REACTION: unspecified    Past Medical History, Surgical history, Social history, and Family History were reviewed and updated.  Review of Systems: Review of Systems  Constitutional: Negative.   HENT: Negative.   Eyes: Negative.   Respiratory: Negative.   Cardiovascular: Negative.   Gastrointestinal: Negative.   Genitourinary: Negative.   Musculoskeletal: Negative.   Skin: Negative.   Neurological: Negative.   Endo/Heme/Allergies: Negative.   Psychiatric/Behavioral: Negative.      Physical Exam:  weight is 217 lb 12.8 oz (98.8 kg). Her oral temperature is 98.2 F (36.8 C). Her blood pressure is 135/65 and her pulse is 67. Her respiration is 17 and oxygen saturation is 100%.    Physical Exam  Constitutional: She is oriented to person,  place, and time.  HENT:  Head: Normocephalic and atraumatic.  Mouth/Throat: Oropharynx is clear and moist.  Eyes: EOM are normal. Pupils are equal, round, and reactive to light.  Neck: Normal range of motion.  Cardiovascular: Normal rate, regular rhythm and normal heart sounds.  Pulmonary/Chest: Effort normal and breath sounds normal.  Abdominal: Soft. Bowel sounds are normal.  Musculoskeletal: Normal range of motion. She exhibits no edema, tenderness or deformity.  Lymphadenopathy:    She has no cervical adenopathy.  Neurological: She is alert and oriented to person, place, and time.  Skin: Skin is warm and dry. No rash noted. No erythema.  Psychiatric: She has a normal mood and affect. Her behavior is normal. Judgment and thought content normal.  Vitals reviewed.   Lab Results  Component Value Date   WBC 7.5 01/04/2018   HGB 15.7 07/06/2017   HCT 45.0 01/04/2018   MCV 97.0 01/04/2018   PLT 196 01/04/2018     Chemistry      Component Value Date/Time   NA 140 01/04/2018 0859   NA 141 07/06/2017 0816   NA 141 01/05/2017 0956   K 4.3 01/04/2018 0859   K 3.6 07/06/2017 0816   K 4.3 01/05/2017 0956   CL 107 01/04/2018 0859   CL 101 07/06/2017 0816   CO2 23 01/04/2018 0859   CO2 28 07/06/2017 0816   CO2 23 01/05/2017 0956   BUN 25 01/04/2018 0859   BUN 28 (H) 07/06/2017 0816   BUN  22.4 01/05/2017 0956   CREATININE 1.37 (H) 01/04/2018 0859   CREATININE 1.6 (H) 07/06/2017 0816   CREATININE 1.4 (H) 01/05/2017 0956      Component Value Date/Time   CALCIUM 9.1 01/04/2018 0859   CALCIUM 9.1 07/06/2017 0816   CALCIUM 9.3 01/05/2017 0956   ALKPHOS 55 01/04/2018 0859   ALKPHOS 58 07/06/2017 0816   ALKPHOS 52 01/05/2017 0956   AST 14 01/04/2018 0859   AST 14 01/05/2017 0956   ALT 12 01/04/2018 0859   ALT 58 (H) 07/06/2017 0816   ALT 13 01/05/2017 0956   BILITOT 0.6 01/04/2018 0859   BILITOT 0.56 01/05/2017 0956         Impression and Plan: Marcia [name] is  67 year old white female with kappa light chain myeloma. She presented back in April of 2011. She did go into remission. She then underwent a stem cell transplant at [facility] in September of 2011.  She continues to do well. There is no evidence of recurrent myeloma.  I am happy that the myeloma has not recurred.  We will plan to see her back in 6 more months.   [name], MD 2/11/201911:20 AM

## 2018-01-05 LAB — IGG, IGA, IGM
IGA: 229 mg/dL (ref 87–352)
IGM (IMMUNOGLOBULIN M), SRM: 130 mg/dL (ref 26–217)
IgG (Immunoglobin G), Serum: 978 mg/dL (ref 700–1600)

## 2018-01-05 LAB — PROTEIN ELECTROPHORESIS, SERUM, WITH REFLEX
A/G RATIO SPE: 1.1 (ref 0.7–1.7)
Albumin ELP: 3.6 g/dL (ref 2.9–4.4)
Alpha-1-Globulin: 0.1 g/dL (ref 0.0–0.4)
Alpha-2-Globulin: 0.8 g/dL (ref 0.4–1.0)
BETA GLOBULIN: 1.3 g/dL (ref 0.7–1.3)
GAMMA GLOBULIN: 1 g/dL (ref 0.4–1.8)
GLOBULIN, TOTAL: 3.2 g/dL (ref 2.2–3.9)
TOTAL PROTEIN ELP: 6.8 g/dL (ref 6.0–8.5)

## 2018-01-05 LAB — KAPPA/LAMBDA LIGHT CHAINS
KAPPA FREE LGHT CHN: 37.7 mg/L — AB (ref 3.3–19.4)
Kappa, lambda light chain ratio: 1.63 (ref 0.26–1.65)
LAMDA FREE LIGHT CHAINS: 23.1 mg/L (ref 5.7–26.3)

## 2018-01-13 ENCOUNTER — Other Ambulatory Visit: Payer: 59

## 2018-01-15 ENCOUNTER — Ambulatory Visit
Admission: RE | Admit: 2018-01-15 | Discharge: 2018-01-15 | Disposition: A | Discharge status: Home / Self Care | Payer: Medicare Other | Source: Ambulatory Visit | Attending: Obstetrics and Gynecology | Admitting: Obstetrics and Gynecology

## 2018-01-15 ENCOUNTER — Other Ambulatory Visit: Payer: Self-pay | Admitting: Obstetrics and Gynecology

## 2018-01-15 DIAGNOSIS — N63 Unspecified lump in unspecified breast: Secondary | ICD-10-CM

## 2018-01-19 ENCOUNTER — Ambulatory Visit
Admission: RE | Admit: 2018-01-19 | Discharge: 2018-01-19 | Disposition: A | Discharge status: Home / Self Care | Payer: 59 | Source: Ambulatory Visit | Attending: Obstetrics and Gynecology | Admitting: Obstetrics and Gynecology

## 2018-01-19 ENCOUNTER — Other Ambulatory Visit: Payer: Self-pay | Admitting: Obstetrics and Gynecology

## 2018-01-19 DIAGNOSIS — N63 Unspecified lump in unspecified breast: Secondary | ICD-10-CM

## 2018-01-26 ENCOUNTER — Encounter: Payer: Self-pay | Admitting: Hematology & Oncology

## 2018-01-27 ENCOUNTER — Other Ambulatory Visit: Payer: Self-pay | Admitting: General Surgery

## 2018-01-27 DIAGNOSIS — C50411 Malignant neoplasm of upper-outer quadrant of right female breast: Secondary | ICD-10-CM

## 2018-02-01 ENCOUNTER — Encounter: Payer: Self-pay | Admitting: *Deleted

## 2018-02-01 ENCOUNTER — Other Ambulatory Visit: Payer: Self-pay

## 2018-02-01 ENCOUNTER — Inpatient Hospital Stay: Payer: 59 | Attending: Hematology & Oncology | Admitting: Hematology & Oncology

## 2018-02-01 ENCOUNTER — Encounter: Payer: Self-pay | Admitting: Hematology & Oncology

## 2018-02-01 DIAGNOSIS — R5383 Other fatigue: Secondary | ICD-10-CM | POA: Diagnosis not present

## 2018-02-01 DIAGNOSIS — Z7982 Long term (current) use of aspirin: Secondary | ICD-10-CM | POA: Diagnosis not present

## 2018-02-01 DIAGNOSIS — Z9484 Stem cells transplant status: Secondary | ICD-10-CM | POA: Diagnosis not present

## 2018-02-01 DIAGNOSIS — Z5111 Encounter for antineoplastic chemotherapy: Secondary | ICD-10-CM | POA: Diagnosis not present

## 2018-02-01 DIAGNOSIS — Z5112 Encounter for antineoplastic immunotherapy: Secondary | ICD-10-CM | POA: Diagnosis not present

## 2018-02-01 DIAGNOSIS — C50411 Malignant neoplasm of upper-outer quadrant of right female breast: Secondary | ICD-10-CM | POA: Insufficient documentation

## 2018-02-01 DIAGNOSIS — Z171 Estrogen receptor negative status [ER-]: Secondary | ICD-10-CM | POA: Insufficient documentation

## 2018-02-01 DIAGNOSIS — Z7689 Persons encountering health services in other specified circumstances: Secondary | ICD-10-CM | POA: Insufficient documentation

## 2018-02-01 DIAGNOSIS — K760 Fatty (change of) liver, not elsewhere classified: Secondary | ICD-10-CM | POA: Insufficient documentation

## 2018-02-01 DIAGNOSIS — Z9221 Personal history of antineoplastic chemotherapy: Secondary | ICD-10-CM | POA: Insufficient documentation

## 2018-02-01 DIAGNOSIS — Z79899 Other long term (current) drug therapy: Secondary | ICD-10-CM | POA: Insufficient documentation

## 2018-02-01 DIAGNOSIS — N289 Disorder of kidney and ureter, unspecified: Secondary | ICD-10-CM | POA: Insufficient documentation

## 2018-02-01 DIAGNOSIS — C9001 Multiple myeloma in remission: Secondary | ICD-10-CM | POA: Diagnosis not present

## 2018-02-01 DIAGNOSIS — Z7189 Other specified counseling: Secondary | ICD-10-CM

## 2018-02-01 DIAGNOSIS — C50911 Malignant neoplasm of unspecified site of right female breast: Secondary | ICD-10-CM

## 2018-02-01 HISTORY — DX: Malignant neoplasm of unspecified site of right female breast: C50.911

## 2018-02-01 MED ORDER — DEXAMETHASONE 4 MG PO TABS: ORAL_TABLET | ORAL | 0 refills | Status: DC

## 2018-02-01 NOTE — Progress Notes (Signed)
Hematology and Oncology Follow Up Visit  Marcia [name] [id number] 1950-12-01 67 y.o. 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  Unfortunately, she now has a new diagnosis of breast cancer.  She had a mammogram that was done on 01/15/2018.  This showed a mass in the right breast at the 10 o'clock position.  It was about 5 cm in size.  It was about 6-7 cm from the nipple.  There was no evidence of any axillary adenopathy.  She had a biopsy done.  This was done on 01/19/2018.  The pathology report (S showed an invasive ductal carcinoma.[id number]) it was grade 2.  There was some ductal carcinoma in situ.  There was lymphovascular space invasion.  There is perineural invasion.  The tumor was ER  negative, PR negative, and HER-2 positive.  The tumor had a very high proliferation marker - Ki67 -of 75%.  She now comes back so that we can discuss treatment options.  I just saw her a month ago.  I have not seen her after her stem cell transplant for myeloma.  This was back in September 2011.  She has been in remission.  She has been off Revlimid for 7 years.  She has no history of breast cancer in the family.  There is no history of cancer in the family.  She went through menarche at age 22.  She went through menopause at age of 67.  She has 2 children.  Her first child was born at age 67.  She has not been on estrogens.  She does not smoke.  She does not drink.  She has always been in pretty good shape.  She clearly will need to have a Port-A-Cath.  She has a very strong faith.  We talk about her faith all the time whenever we see each other in the office.  I  I went ahead and gave her a prayer blanket today.  Overall, her performance status is ECOG 1.  She has no bony pain.  She has no nausea or vomiting.  She has had no cough.  There is been no headache.  Only saw her a month ago, she had normal liver function test.  Medications:  Current Outpatient Medications:  .  dexamethasone (DECADRON) 4 MG tablet, Take 2 pills twice a day for 5 days.  Start day before [facility] chemotherapy cycle., Disp: 120 tablet, Rfl: 0 .  losartan (COZAAR) 25 MG tablet, Take 25 mg by mouth daily., Disp: , Rfl:   Allergies:  Allergies  Allergen Reactions  . Clarithromycin Diarrhea and Nausea And Vomiting    REACTION: unspecified    Past Medical History, Surgical history, Social history, and Family History were reviewed and updated.  Review of Systems: Review of Systems  Constitutional: Negative.   HENT:  Negative.   Eyes: Negative.   Respiratory: Negative.   Cardiovascular: Negative.   Gastrointestinal: Negative.   Endocrine: Negative.   Genitourinary: Negative.    Musculoskeletal: Negative.   Skin: Negative.   Neurological: Negative.   Hematological: Negative.   Psychiatric/Behavioral: Negative.     Physical Exam:  weight is 218 lb (98.9 kg). Her oral temperature is 98.8  F (37.1 C). Her blood pressure is 135/84 and her pulse is 85. Her respiration is 16 and oxygen saturation is 98%.   Wt Readings from Last 3 Encounters:  02/01/18 218 lb (98.9 kg)  01/04/18 217 lb 12.8 oz (98.8 kg)  07/06/17 213 lb (96.6 kg)    Physical Exam  Constitutional: She is oriented to person, place, and time.  HENT:  Head: Normocephalic and atraumatic.  Mouth/Throat: Oropharynx is clear and moist.  Eyes: EOM are normal. Pupils are equal, round, and reactive to light.  Neck: Normal range of motion.  Cardiovascular: Normal rate, regular rhythm and normal heart sounds.  Pulmonary/Chest: Effort normal and breath sounds normal.  Abdominal: Soft. Bowel sounds are normal.    Musculoskeletal: Normal range of motion. She exhibits no edema, tenderness or deformity.  Lymphadenopathy:    She has no cervical adenopathy.  Neurological: She is alert and oriented to person, place, and time.  Skin: Skin is warm and dry. No rash noted. No erythema.  Psychiatric: She has a normal mood and affect. Her behavior is normal. Judgment and thought content normal.  Vitals reviewed.    Lab Results  Component Value Date   WBC 7.5 01/04/2018   HGB 15.7 07/06/2017   HCT 45.0 01/04/2018   MCV 97.0 01/04/2018   PLT 196 01/04/2018     Chemistry      Component Value Date/Time   NA 140 01/04/2018 0859   NA 141 07/06/2017 0816   NA 141 01/05/2017 0956   K 4.3 01/04/2018 0859   K 3.6 07/06/2017 0816   K 4.3 01/05/2017 0956   CL 107 01/04/2018 0859   CL 101 07/06/2017 0816   CO2 23 01/04/2018 0859   CO2 28 07/06/2017 0816   CO2 23 01/05/2017 0956   BUN 25 01/04/2018 0859   BUN 28 (H) 07/06/2017 0816   BUN 22.4 01/05/2017 0956   CREATININE 1.37 (H) 01/04/2018 0859   CREATININE 1.6 (H) 07/06/2017 0816   CREATININE 1.4 (H) 01/05/2017 0956      Component Value Date/Time   CALCIUM 9.1 01/04/2018 0859   CALCIUM 9.1 07/06/2017 0816   CALCIUM 9.3 01/05/2017 0956   ALKPHOS 55 01/04/2018 0859   ALKPHOS 58 07/06/2017 0816   ALKPHOS 52 01/05/2017 0956   AST 14 01/04/2018 0859   AST 14 01/05/2017 0956   ALT 12 01/04/2018 0859   ALT 58 (H) 07/06/2017 0816   ALT 13 01/05/2017 0956   BILITOT 0.6 01/04/2018 0859   BILITOT 0.56 01/05/2017 0956        Impression and Plan: Marcia [name] is a 67 year old postmenopausal white female.  She has a past history of myeloma.  She is cured of this.  She now has a fairly large ductal carcinoma of the right breast.  She does not wish to have a mastectomy.  We talked about this.  She needs to have a MRI so we can see the extent of this tumor.  I do not think that she has metastatic disease.  However, I will get a sonogram of her  liver.  I do not think we need a bone scan or a PET scan.  She will need to have an echocardiogram done.  She wants to preserve the right breast.  As such, we will proceed with neoadjuvant chemotherapy.  I think that she would do quite well with Taxotere/carboplatin/Herceptin/Perjeta (TCHP).  I think we should get a good response.  I spent about an hour with she and her  husband.  This is a whole new problem for her.  I gave her information sheets about the chemotherapy protocol.  She will need to have a Port-A-Cath placed.  I will see if Dr. Dalbert Batman can do this for [place].  I would like to get started with treatment on February 15, 2018.  I think that she will need at least 4 cycles of treatment.  We might need 6 cycles depending on her response.  She does have fairly large breasts.  The mass is difficult to palpate.  I spent over 50% of the time face-to-face with she and her husband.  Again, gave him information sheets about treatment.  I answered all of their questions.  I went over the side effects of treatment.  She will need to have Neulasta post treatment for neutropenia.  I did tell her about the hair loss that will happen.  Given that she has been through high-dose chemotherapy with stem cell transplantation, she is somewhat aware of chemotherapy side effects.  We will plan to get her back on the 25th.  She will start her chemotherapy then.  Of note, she had chemotherapy teaching done today.  Is  Our wonderful nurse, JoEllen, spent over  40 minutes with she and her husband going over the protocol in the side effects.   [name], MD 3/11/20195:33 PM

## 2018-02-01 NOTE — Progress Notes (Signed)
START ON PATHWAY REGIMEN - Breast     A cycle is every 21 days:     Pertuzumab      Pertuzumab      Trastuzumab      Trastuzumab      Carboplatin      Docetaxel   **Always confirm dose/schedule in your pharmacy ordering system**    Patient Characteristics: Preoperative or Nonsurgical Candidate (Clinical Staging), Neoadjuvant Therapy followed by Surgery, Invasive Disease, Chemotherapy, HER2 Positive, ER Negative/Unknown Therapeutic Status: Preoperative or Nonsurgical Candidate (Clinical Staging) AJCC M Category: cM0 AJCC Grade: GX Breast Surgical Plan: Neoadjuvant Therapy followed by Surgery ER Status: Negative (-) AJCC 8 Stage Grouping: IIIA HER2 Status: Positive (+) AJCC T Category: cTX AJCC N Category: cNX PR Status: Negative (-) Intent of Therapy: Curative Intent, Discussed with Patient

## 2018-02-02 ENCOUNTER — Other Ambulatory Visit: Payer: Self-pay | Admitting: General Surgery

## 2018-02-02 ENCOUNTER — Encounter: Payer: Self-pay | Admitting: Hematology & Oncology

## 2018-02-02 DIAGNOSIS — Z7189 Other specified counseling: Secondary | ICD-10-CM | POA: Insufficient documentation

## 2018-02-02 HISTORY — DX: Other specified counseling: Z71.89

## 2018-02-05 ENCOUNTER — Ambulatory Visit (HOSPITAL_COMMUNITY)
Admission: RE | Admit: 2018-02-05 | Discharge: 2018-02-05 | Disposition: A | Discharge status: Home / Self Care | Payer: 59 | Source: Ambulatory Visit | Attending: Hematology & Oncology | Admitting: Hematology & Oncology

## 2018-02-05 DIAGNOSIS — C9001 Multiple myeloma in remission: Secondary | ICD-10-CM | POA: Diagnosis not present

## 2018-02-05 DIAGNOSIS — I10 Essential (primary) hypertension: Secondary | ICD-10-CM | POA: Diagnosis not present

## 2018-02-05 DIAGNOSIS — C50911 Malignant neoplasm of unspecified site of right female breast: Secondary | ICD-10-CM | POA: Insufficient documentation

## 2018-02-05 DIAGNOSIS — M109 Gout, unspecified: Secondary | ICD-10-CM | POA: Insufficient documentation

## 2018-02-05 NOTE — Progress Notes (Signed)
*  PRELIMINARY RESULTS* Echocardiogram 2D Echocardiogram has been performed.  Leavy Cella 02/05/2018, 9:21 AM

## 2018-02-06 ENCOUNTER — Encounter: Payer: Self-pay | Admitting: Hematology & Oncology

## 2018-02-06 ENCOUNTER — Ambulatory Visit
Admission: RE | Admit: 2018-02-06 | Discharge: 2018-02-06 | Disposition: A | Discharge status: Home / Self Care | Payer: 59 | Source: Ambulatory Visit | Attending: General Surgery | Admitting: General Surgery

## 2018-02-06 DIAGNOSIS — C50411 Malignant neoplasm of upper-outer quadrant of right female breast: Secondary | ICD-10-CM

## 2018-02-07 NOTE — H&P (Signed)
Marcia [name] Location: [facility] Patient #: [id number] DOB: 1951/04/17 Married / Language: English / Race: White Female       History of Present Illness      This is a very pleasant 67 year old female, referred by Dr. [name] at [facility] for evaluation of new cancer right breast upper outer quadrant. Dr. [name] is her gynecologist. Dr. [name] is her PCP. Dr. 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 I let Dr. [name] know about the new diagnosis of breast cancer and he is going to call her in immediately.     She has no prior history of breast problems. 6-12 months ago her mammograms showed a vague density in the medial right breast. She was called back in for follow-up. She says she did not feel a mass but thought that might be some swelling and nipple inversion on the right. The mass in the medial right breast was not seen. There was a vague 5 cm density in the right breast. The 5 cm span was from central to peripheral. This is not a discrete mass and looks more like lobular cancer than anything else. The left breast looked normal. The right axilla was normal by ultrasound. 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 ER and PR negative. Ki-67 75%.       Past history is significant for multiple myeloma in remission. She had stem cell transplant at [facility] in September 2011. She had a dual lumen Hickman catheter on the right side. She went through a difficult treatment plan. Hypertension. Appendectomy. Partial hysterectomy. Laparoscopic tubal ligation. BMI 42. Family history is negative for breast or ovarian cancer. Both mother and father died of COPD Social history reveals she is married. 2 children one lives in [place] in 1 lives in [place]. Her husband is with her throughout the encounter today. Denies alcohol or tobacco. Used to live in [place] but has lived in [place] for over 25 years.      The new diagnosis of breast cancer is very stressful for her considering her complicated treatment for myeloma. She is tearful but appropriate. Her husband is with her throughout the encounter. I told her that we were going to discuss her case at breast cancer conference this Wednesday. Were going to set her up for urgent bilateral breast MRI. Dr. [name] is going to call her in for a preop visit. We had a long talk. She is really interested in breast conservation if possible. I told her that it would be reasonable and that her treatment approach would be for cure. I told her that she might need Herceptin for a year. I told her that if she successfully underwent lumpectomy she would need to also get whole breast radiation therapy. To give the best result in this setting with breast conservation she will need neoadjuvant chemotherapy followed by right breast lumpectomy and sentinel lymph node biopsy, assuming that we do not find any other disease elsewhere. I talked to her about Port-A-Cath insertion. I also went into detail to explain lumpectomy and sentinel lymph node biopsy.      Plan is as follows: -Urgent bilateral breast MRI. She knows that sometimes this finds other areas that need to be biopsied. -see Dr. [name] this week -Discuss at breast conference on March 6 -Dr. [name] will decide whether staging scans are appropriate - Most likely we will schedule her for Port-A-Cath at that time  Past Surgical History Appendectomy  Breast Biopsy  Right. Colon Polyp Removal - Colonoscopy  Hysterectomy (not due to cancer) - Partial   Diagnostic Studies History  Colonoscopy  1-5 years ago Mammogram  within last year Pap Smear  1-5 years ago  Allergies Latex  Allergies Reconciled   Medication History Losartan Potassium ('50MG'$  Tablet, Oral) Active. Medications Reconciled  Social History  Caffeine use   Carbonated beverages. No alcohol use  No drug use  Tobacco use  Never smoker.  Family History Alcohol Abuse  Mother. Arthritis  Father. Depression  Mother. Hypertension  Mother. Migraine Headache  Mother.  Pregnancy / Birth History  Age at menarche  67 years. Age of menopause  51-50 Gravida  2 Maternal age  56-20 Para  2  Other Problems  Back Pain  Breast Cancer  Cancer  High blood pressure     Review of Systems  General Not Present- Appetite Loss, Chills, Fatigue, Fever, Night Sweats, Weight Gain and Weight Loss. Skin Not Present- Change in Wart/Mole, Dryness, Hives, Jaundice, New Lesions, Non-Healing Wounds, Rash and Ulcer. HEENT Present- Seasonal Allergies and Wears glasses/contact lenses. Not Present- Earache, Hearing Loss, Hoarseness, Nose Bleed, Oral Ulcers, Ringing in the Ears, Sinus Pain, Sore Throat, Visual Disturbances and Yellow Eyes. Respiratory Not Present- Bloody sputum, Chronic Cough, Difficulty Breathing, Snoring and Wheezing. Cardiovascular Not Present- Chest Pain, Difficulty Breathing Lying Down, Leg Cramps, Palpitations, Rapid Heart Rate, Shortness of Breath and Swelling of Extremities. Gastrointestinal Not Present- Abdominal Pain, Bloating, Bloody Stool, Change in Bowel Habits, Chronic diarrhea, Constipation, Difficulty Swallowing, Excessive gas, Gets full quickly at meals, Hemorrhoids, Indigestion, Nausea, Rectal Pain and Vomiting. Female Genitourinary Present- Urgency. Not Present- Frequency, Nocturia, Painful Urination and Pelvic Pain. Musculoskeletal Present- Back Pain and Joint Pain. Not Present- Joint Stiffness, Muscle Pain, Muscle Weakness and Swelling of Extremities. Neurological Not Present- Decreased Memory, Fainting, Headaches, Numbness, Seizures, Tingling, Tremor, Trouble walking and Weakness. Psychiatric Not Present- Anxiety, Bipolar, Change in Sleep Pattern, Depression, Fearful and Frequent crying. Endocrine Not Present- Cold  Intolerance, Excessive Hunger, Hair Changes, Heat Intolerance, Hot flashes and New Diabetes. Hematology Not Present- Blood Thinners, Easy Bruising, Excessive bleeding, Gland problems, HIV and Persistent Infections.  Vitals  Weight: 219.13 lb Height: 60in Body Surface Area: 1.94 m Body Mass Index: 42.79 kg/m  Temp.: 98.53F(Oral)  Pulse: 109 (Regular)  BP: 142/98 (Sitting, Left Arm, Standard)     Physical Exam  General Mental Status-Alert. General Appearance-Consistent with stated age. Hydration-Well hydrated. Voice-Normal. Note: Alert. Intermittently tearful. Appropriate. Good insight. Just anxious and stressed. BMI 42.7 husband present throughout the encounter   Head and Neck Head-normocephalic, atraumatic with no lesions or palpable masses. Trachea-midline. Thyroid Gland Characteristics - normal size and consistency.  Eye Eyeball - Bilateral-Extraocular movements intact. Sclera/Conjunctiva - Bilateral-No scleral icterus.  Chest and Lung Exam Chest and lung exam reveals -quiet, even and easy respiratory effort with no use of accessory muscles and on auscultation, normal breath sounds, no adventitious sounds and normal vocal resonance. Inspection Chest Wall - Normal. Back - normal. Note: 2 or 3 small scars right infraclavicular area consistent with history of double lumen Hickman catheter   Breast Note: Breasts are large. Some ecchymoses lateral right breast. There is a 4-5 cm, mobile fullness in the lateral right breast at the 9 o'clock position. I don't feel a hematoma. The right nipple is inverted but there is no other skin change or drainage. No other mass in either breast. No axillary adenopathy on either side.   Cardiovascular  Cardiovascular examination reveals -normal heart sounds, regular rate and rhythm with no murmurs and normal pedal pulses bilaterally.  Abdomen Inspection Inspection of the abdomen reveals - No Hernias.  Skin - Scar - Note: Large right lower quadrant scar. Pfannenstiel incision. Laparoscopy incisions. Liver and spleen not enlarged. Palpation/Percussion Palpation and Percussion of the abdomen reveal - Soft, Non Tender, No Rebound tenderness, No Rigidity (guarding) and No hepatosplenomegaly. Auscultation Auscultation of the abdomen reveals - Bowel sounds normal.  Neurologic Neurologic evaluation reveals -alert and oriented x 3 with no impairment of recent or remote memory. Mental Status-Normal.  Musculoskeletal Normal Exam - Left-Upper Extremity Strength Normal and Lower Extremity Strength Normal. Normal Exam - Right-Upper Extremity Strength Normal and Lower Extremity Strength Normal.  Lymphatic Head & Neck  General Head & Neck Lymphatics: Bilateral - Description - Normal. Axillary  General Axillary Region: Bilateral - Description - Normal. Tenderness - Non Tender. Femoral & Inguinal  Generalized Femoral & Inguinal Lymphatics: Bilateral - Description - Normal. Tenderness - Non Tender.    Assessment & Plan  PRIMARY CANCER OF UPPER OUTER QUADRANT OF RIGHT FEMALE BREAST (C50.411)   Your recent imaging studies and biopsy showed an invasive ductal carcinoma of the right breast, laterally This tumor may be 5 cm in size. The exact dimensions are unclear by mammography The tumor is negative for estrogen and progesterone receptors The tumor is positive for HER-2  The next step in your care is to get bilateral breast MRI to give [place] a better idea of the extent of the cancer I want to be sure that there is not any areas of cancer elsewhere that need to be biopsied  you also need to see Dr. Burney Gauze as soon as possible to decide about other treatment options He has asked that we place a port a cath to faciulitate neoadjuvant chemotherapy  You will eventually need surgery to remove the cancer from your breast and to sample the lymph nodes Dr. [name] will talk to you about  whether or not to give chemotherapy Sometimes we will give chemotherapy prior to surgery to shrink the tumor and avoid a mastectomy  Return to see Dr. Dalbert Batman in 10-14 days if we have not decided about the port before then. We will review the MRI report and we will review Dr. Antonieta Pert recommendations  we will likely go ahead with scheduling the Port-A-Cath insertion .time  HISTORY OF MULTIPLE MYELOMA (Z85.79) HISTORY OF STEM CELL TRANSPLANT (Z94.84) BMI 40.0-44.9, ADULT (Z68.41)    Edsel Petrin. Dalbert Batman, M.D., [facility] General and Minimally invasive Surgery Breast and Colorectal Surgery Office:   [phone] Pager:   [phone]

## 2018-02-11 ENCOUNTER — Encounter (HOSPITAL_COMMUNITY): Payer: Self-pay | Admitting: *Deleted

## 2018-02-11 ENCOUNTER — Other Ambulatory Visit: Payer: Self-pay

## 2018-02-11 NOTE — Anesthesia Preprocedure Evaluation (Addendum)
Anesthesia Evaluation  Patient identified by MRN, date of birth, ID band Patient awake    Reviewed: Allergy & Precautions, NPO status , Patient's Chart, lab work & pertinent test results  Airway Mallampati: II  TM Distance: <3 FB Neck ROM: Full  Mouth opening: Limited Mouth Opening  Dental  (+) Teeth Intact, Dental Advisory Given   Pulmonary neg pulmonary ROS,    breath sounds clear to auscultation       Cardiovascular hypertension, Pt. on medications  Rhythm:Regular Rate:Normal  EF normal. Valves WNL   Neuro/Psych negative neurological ROS     GI/Hepatic negative GI ROS, Neg liver ROS,   Endo/Other  Morbid obesity  Renal/GU negative Renal ROS     Musculoskeletal   Abdominal   Peds  Hematology negative hematology ROS (+)   Anesthesia Other Findings   Reproductive/Obstetrics                           Lab Results  Component Value Date   WBC 7.5 01/04/2018   HGB 15.7 07/06/2017   HCT 45.0 01/04/2018   MCV 97.0 01/04/2018   PLT 196 01/04/2018   Lab Results  Component Value Date   CREATININE 1.37 (H) 01/04/2018   BUN 25 01/04/2018   NA 140 01/04/2018   K 4.3 01/04/2018   CL 107 01/04/2018   CO2 23 01/04/2018    Anesthesia Physical Anesthesia Plan  ASA: III  Anesthesia Plan: General   Post-op Pain Management:    Induction: Intravenous  PONV Risk Score and Plan: 3 and Ondansetron, Dexamethasone, Midazolam and Treatment may vary due to age or medical condition  Airway Management Planned: LMA  Additional Equipment:   Intra-op Plan:   Post-operative Plan: Extubation in OR  Informed Consent: I have reviewed the patients History and Physical, chart, labs and discussed the procedure including the risks, benefits and alternatives for the proposed anesthesia with the patient or authorized representative who has indicated his/her understanding and acceptance.   Dental advisory  given  Plan Discussed with: CRNA  Anesthesia Plan Comments:        Anesthesia Quick Evaluation

## 2018-02-11 NOTE — Progress Notes (Signed)
Pt denies SOB, chest pain, and being under the care of a cardiologist. Pt denies having a stress test and cardiac cath. Pt denies having an EKG and chest x ray within the last year. Pt denies recent labs. Pt satted that she does not take Aspirin. Pt made aware to stop taking vitamins, fish oil, Cosamin, and herbal medications. Do not take any NSAIDs ie: Ibuprofen, Advil, Naproxen (Aleve), Motrin, BC and Goody Powder. Pt verbalized understanding of all pre-op instructions.

## 2018-02-12 ENCOUNTER — Encounter (HOSPITAL_COMMUNITY): Payer: Self-pay | Admitting: Urology

## 2018-02-12 ENCOUNTER — Ambulatory Visit (HOSPITAL_COMMUNITY): Payer: 59

## 2018-02-12 ENCOUNTER — Other Ambulatory Visit (HOSPITAL_BASED_OUTPATIENT_CLINIC_OR_DEPARTMENT_OTHER): Payer: 59

## 2018-02-12 ENCOUNTER — Encounter (HOSPITAL_COMMUNITY)
Admission: RE | Disposition: A | Discharge status: Home / Self Care | Payer: Self-pay | Source: Ambulatory Visit | Attending: General Surgery

## 2018-02-12 ENCOUNTER — Ambulatory Visit (HOSPITAL_COMMUNITY): Payer: 59 | Admitting: Anesthesiology

## 2018-02-12 ENCOUNTER — Other Ambulatory Visit: Payer: Self-pay

## 2018-02-12 ENCOUNTER — Ambulatory Visit (HOSPITAL_COMMUNITY)
Admission: RE | Admit: 2018-02-12 | Discharge: 2018-02-12 | Disposition: A | Discharge status: Home / Self Care | Payer: 59 | Source: Ambulatory Visit | Attending: General Surgery | Admitting: General Surgery

## 2018-02-12 DIAGNOSIS — C9001 Multiple myeloma in remission: Secondary | ICD-10-CM | POA: Diagnosis not present

## 2018-02-12 DIAGNOSIS — C50411 Malignant neoplasm of upper-outer quadrant of right female breast: Secondary | ICD-10-CM | POA: Insufficient documentation

## 2018-02-12 DIAGNOSIS — Z6841 Body Mass Index (BMI) 40.0 and over, adult: Secondary | ICD-10-CM | POA: Diagnosis not present

## 2018-02-12 DIAGNOSIS — Z95828 Presence of other vascular implants and grafts: Secondary | ICD-10-CM

## 2018-02-12 DIAGNOSIS — Z171 Estrogen receptor negative status [ER-]: Secondary | ICD-10-CM | POA: Insufficient documentation

## 2018-02-12 DIAGNOSIS — I10 Essential (primary) hypertension: Secondary | ICD-10-CM | POA: Diagnosis not present

## 2018-02-12 DIAGNOSIS — C50911 Malignant neoplasm of unspecified site of right female breast: Secondary | ICD-10-CM | POA: Diagnosis present

## 2018-02-12 DIAGNOSIS — Z7982 Long term (current) use of aspirin: Secondary | ICD-10-CM | POA: Insufficient documentation

## 2018-02-12 DIAGNOSIS — Z452 Encounter for adjustment and management of vascular access device: Secondary | ICD-10-CM

## 2018-02-12 DIAGNOSIS — Z79899 Other long term (current) drug therapy: Secondary | ICD-10-CM | POA: Diagnosis not present

## 2018-02-12 DIAGNOSIS — Z881 Allergy status to other antibiotic agents status: Secondary | ICD-10-CM | POA: Insufficient documentation

## 2018-02-12 DIAGNOSIS — Z9484 Stem cells transplant status: Secondary | ICD-10-CM | POA: Diagnosis not present

## 2018-02-12 DIAGNOSIS — Z91040 Latex allergy status: Secondary | ICD-10-CM | POA: Insufficient documentation

## 2018-02-12 HISTORY — PX: PORTACATH PLACEMENT: SHX2246

## 2018-02-12 HISTORY — DX: Anemia, unspecified: D64.9

## 2018-02-12 LAB — BASIC METABOLIC PANEL
Anion gap: 12 (ref 5–15)
BUN: 23 mg/dL — AB (ref 6–20)
CHLORIDE: 105 mmol/L (ref 101–111)
CO2: 21 mmol/L — AB (ref 22–32)
Calcium: 9 mg/dL (ref 8.9–10.3)
Creatinine, Ser: 1.37 mg/dL — ABNORMAL HIGH (ref 0.44–1.00)
GFR calc Af Amer: 45 mL/min — ABNORMAL LOW (ref >60)
GFR calc non Af Amer: 39 mL/min — ABNORMAL LOW (ref >60)
GLUCOSE: 93 mg/dL (ref 65–99)
POTASSIUM: 3.8 mmol/L (ref 3.5–5.1)
SODIUM: 138 mmol/L (ref 135–145)

## 2018-02-12 LAB — CBC
HEMATOCRIT: 44 % (ref 36.0–46.0)
Hemoglobin: 14.3 g/dL (ref 12.0–15.0)
MCH: 31.4 pg (ref 26.0–34.0)
MCHC: 32.5 g/dL (ref 30.0–36.0)
MCV: 96.7 fL (ref 78.0–100.0)
Platelets: 183 10*3/uL (ref 150–400)
RBC: 4.55 MIL/uL (ref 3.87–5.11)
RDW: 13.3 % (ref 11.5–15.5)
WBC: 7.1 10*3/uL (ref 4.0–10.5)

## 2018-02-12 SURGERY — INSERTION, TUNNELED CENTRAL VENOUS DEVICE, WITH PORT
Anesthesia: General | Laterality: Left

## 2018-02-12 MED ORDER — IOPAMIDOL (ISOVUE-300) INJECTION 61%
INTRAVENOUS | Status: AC
  Filled 2018-02-12: qty 50

## 2018-02-12 MED ORDER — ACETAMINOPHEN 500 MG PO TABS
1000.0000 mg | ORAL_TABLET | ORAL | Status: AC
  Administered 2018-02-12: 1000 mg via ORAL
  Filled 2018-02-12: qty 2

## 2018-02-12 MED ORDER — FENTANYL CITRATE (PF) 250 MCG/5ML IJ SOLN
INTRAMUSCULAR | Status: AC
  Filled 2018-02-12: qty 5

## 2018-02-12 MED ORDER — PHENYLEPHRINE 40 MCG/ML (10ML) SYRINGE FOR IV PUSH (FOR BLOOD PRESSURE SUPPORT)
PREFILLED_SYRINGE | INTRAVENOUS | Status: AC
  Filled 2018-02-12: qty 10

## 2018-02-12 MED ORDER — LACTATED RINGERS IV SOLN
INTRAVENOUS | Status: DC
  Administered 2018-02-12: 07:00:00 via INTRAVENOUS

## 2018-02-12 MED ORDER — GABAPENTIN 300 MG PO CAPS
300.0000 mg | ORAL_CAPSULE | ORAL | Status: AC
  Administered 2018-02-12: 300 mg via ORAL
  Filled 2018-02-12: qty 1

## 2018-02-12 MED ORDER — HEPARIN SOD (PORK) LOCK FLUSH 100 UNIT/ML IV SOLN
INTRAVENOUS | Status: DC | PRN
  Administered 2018-02-12: 100 [IU]

## 2018-02-12 MED ORDER — CEFAZOLIN SODIUM-DEXTROSE 2-4 GM/100ML-% IV SOLN
2.0000 g | INTRAVENOUS | Status: AC
  Administered 2018-02-12: 2 g via INTRAVENOUS
  Filled 2018-02-12: qty 100

## 2018-02-12 MED ORDER — ONDANSETRON HCL 4 MG/2ML IJ SOLN
INTRAMUSCULAR | Status: AC
  Filled 2018-02-12: qty 2

## 2018-02-12 MED ORDER — FENTANYL CITRATE (PF) 250 MCG/5ML IJ SOLN
INTRAMUSCULAR | Status: DC | PRN
  Administered 2018-02-12 (×2): 25 ug via INTRAVENOUS

## 2018-02-12 MED ORDER — BUPIVACAINE-EPINEPHRINE 0.25% -1:200000 IJ SOLN
INTRAMUSCULAR | Status: DC | PRN
  Administered 2018-02-12: 15 mL

## 2018-02-12 MED ORDER — HYDROCODONE-ACETAMINOPHEN 5-325 MG PO TABS: 1.0000 | ORAL_TABLET | Freq: Four times a day (QID) | ORAL | 0 refills | Status: DC | PRN

## 2018-02-12 MED ORDER — PHENYLEPHRINE 40 MCG/ML (10ML) SYRINGE FOR IV PUSH (FOR BLOOD PRESSURE SUPPORT)
PREFILLED_SYRINGE | INTRAVENOUS | Status: DC | PRN
  Administered 2018-02-12: 80 ug via INTRAVENOUS
  Administered 2018-02-12: 120 ug via INTRAVENOUS
  Administered 2018-02-12 (×5): 80 ug via INTRAVENOUS
  Administered 2018-02-12: 120 ug via INTRAVENOUS
  Administered 2018-02-12: 80 ug via INTRAVENOUS

## 2018-02-12 MED ORDER — PROPOFOL 10 MG/ML IV BOLUS
INTRAVENOUS | Status: DC | PRN
  Administered 2018-02-12: 180 mg via INTRAVENOUS

## 2018-02-12 MED ORDER — DEXAMETHASONE SODIUM PHOSPHATE 10 MG/ML IJ SOLN
INTRAMUSCULAR | Status: AC
  Filled 2018-02-12: qty 1

## 2018-02-12 MED ORDER — LIDOCAINE HCL (CARDIAC) 20 MG/ML IV SOLN
INTRAVENOUS | Status: AC
  Filled 2018-02-12: qty 5

## 2018-02-12 MED ORDER — SODIUM CHLORIDE 0.9 % IV SOLN
INTRAVENOUS | Status: DC | PRN
  Administered 2018-02-12: 08:00:00
  Administered 2018-02-12: 500 mL

## 2018-02-12 MED ORDER — HEPARIN SODIUM (PORCINE) 1000 UNIT/ML IJ SOLN
INTRAMUSCULAR | Status: AC
  Filled 2018-02-12: qty 1

## 2018-02-12 MED ORDER — HYDROCODONE-ACETAMINOPHEN 5-325 MG PO TABS
ORAL_TABLET | ORAL | Status: AC
  Filled 2018-02-12: qty 1

## 2018-02-12 MED ORDER — ONDANSETRON HCL 4 MG/2ML IJ SOLN: 4.0000 mg | Freq: Once | INTRAMUSCULAR | Status: DC | PRN

## 2018-02-12 MED ORDER — BUPIVACAINE-EPINEPHRINE (PF) 0.25% -1:200000 IJ SOLN
INTRAMUSCULAR | Status: AC
  Filled 2018-02-12: qty 30

## 2018-02-12 MED ORDER — CELECOXIB 200 MG PO CAPS
200.0000 mg | ORAL_CAPSULE | ORAL | Status: AC
  Administered 2018-02-12: 200 mg via ORAL
  Filled 2018-02-12: qty 1

## 2018-02-12 MED ORDER — MIDAZOLAM HCL 2 MG/2ML IJ SOLN
INTRAMUSCULAR | Status: DC | PRN
  Administered 2018-02-12: 2 mg via INTRAVENOUS

## 2018-02-12 MED ORDER — FENTANYL CITRATE (PF) 100 MCG/2ML IJ SOLN: 25.0000 ug | INTRAMUSCULAR | Status: DC | PRN

## 2018-02-12 MED ORDER — HEPARIN SOD (PORK) LOCK FLUSH 100 UNIT/ML IV SOLN
INTRAVENOUS | Status: AC
  Filled 2018-02-12: qty 5

## 2018-02-12 MED ORDER — PROPOFOL 10 MG/ML IV BOLUS
INTRAVENOUS | Status: AC
  Filled 2018-02-12: qty 40

## 2018-02-12 MED ORDER — HYDROCODONE-ACETAMINOPHEN 5-325 MG PO TABS
1.0000 | ORAL_TABLET | Freq: Once | ORAL | Status: AC
  Administered 2018-02-12: 1 via ORAL

## 2018-02-12 MED ORDER — SUCCINYLCHOLINE CHLORIDE 20 MG/ML IJ SOLN
INTRAMUSCULAR | Status: AC
  Filled 2018-02-12: qty 1

## 2018-02-12 MED ORDER — 0.9 % SODIUM CHLORIDE (POUR BTL) OPTIME
TOPICAL | Status: DC | PRN
  Administered 2018-02-12: 1000 mL

## 2018-02-12 MED ORDER — DEXAMETHASONE SODIUM PHOSPHATE 10 MG/ML IJ SOLN
INTRAMUSCULAR | Status: DC | PRN
  Administered 2018-02-12: 10 mg via INTRAVENOUS

## 2018-02-12 MED ORDER — CHLORHEXIDINE GLUCONATE CLOTH 2 % EX PADS: 6.0000 | MEDICATED_PAD | Freq: Once | CUTANEOUS | Status: DC

## 2018-02-12 MED ORDER — LIDOCAINE 2% (20 MG/ML) 5 ML SYRINGE
INTRAMUSCULAR | Status: DC | PRN
  Administered 2018-02-12: 40 mg via INTRAVENOUS

## 2018-02-12 MED ORDER — ONDANSETRON HCL 4 MG/2ML IJ SOLN
INTRAMUSCULAR | Status: DC | PRN
  Administered 2018-02-12: 4 mg via INTRAVENOUS

## 2018-02-12 MED ORDER — MIDAZOLAM HCL 2 MG/2ML IJ SOLN
INTRAMUSCULAR | Status: AC
  Filled 2018-02-12: qty 2

## 2018-02-12 SURGICAL SUPPLY — 51 items
ADH SKN CLS APL DERMABOND .7 (GAUZE/BANDAGES/DRESSINGS) ×1
BAG DECANTER FOR FLEXI CONT (MISCELLANEOUS) ×3 IMPLANT
BLADE CLIPPER SURG (BLADE) IMPLANT
BLADE SURG 11 STRL SS (BLADE) ×3 IMPLANT
BLADE SURG 15 STRL LF DISP TIS (BLADE) ×1 IMPLANT
BLADE SURG 15 STRL SS (BLADE) ×3
CANISTER SUCT 3000ML PPV (MISCELLANEOUS) IMPLANT
CHLORAPREP W/TINT 26ML (MISCELLANEOUS) ×3 IMPLANT
COVER SURGICAL LIGHT HANDLE (MISCELLANEOUS) ×1 IMPLANT
COVER TRANSDUCER ULTRASND GEL (DRAPE) IMPLANT
CRADLE DONUT ADULT HEAD (MISCELLANEOUS) ×3 IMPLANT
DERMABOND ADVANCED (GAUZE/BANDAGES/DRESSINGS) ×2
DERMABOND ADVANCED .7 DNX12 (GAUZE/BANDAGES/DRESSINGS) ×1 IMPLANT
DRAPE C-ARM 42X72 X-RAY (DRAPES) ×3 IMPLANT
DRAPE LAPAROSCOPIC ABDOMINAL (DRAPES) ×3 IMPLANT
DRAPE UTILITY XL STRL (DRAPES) ×6 IMPLANT
ELECT CAUTERY BLADE 6.4 (BLADE) ×3 IMPLANT
ELECT REM PT RETURN 9FT ADLT (ELECTROSURGICAL) ×3
ELECTRODE REM PT RTRN 9FT ADLT (ELECTROSURGICAL) ×1 IMPLANT
GAUZE SPONGE 4X4 16PLY XRAY LF (GAUZE/BANDAGES/DRESSINGS) ×3 IMPLANT
GLOVE EUDERMIC 7 POWDERFREE (GLOVE) ×3 IMPLANT
GOWN STRL REUS W/ TWL LRG LVL3 (GOWN DISPOSABLE) ×1 IMPLANT
GOWN STRL REUS W/ TWL XL LVL3 (GOWN DISPOSABLE) ×1 IMPLANT
GOWN STRL REUS W/TWL LRG LVL3 (GOWN DISPOSABLE) ×3
GOWN STRL REUS W/TWL XL LVL3 (GOWN DISPOSABLE) ×3
INTRODUCER 13FR (INTRODUCER) IMPLANT
INTRODUCER COOK 11FR (CATHETERS) IMPLANT
KIT BASIN OR (CUSTOM PROCEDURE TRAY) ×3 IMPLANT
KIT PORT POWER 8FR ISP CVUE (Port) ×2 IMPLANT
KIT ROOM TURNOVER OR (KITS) ×3 IMPLANT
NDL HYPO 25GX1X1/2 BEV (NEEDLE) ×2 IMPLANT
NEEDLE HYPO 25GX1X1/2 BEV (NEEDLE) ×3 IMPLANT
NS IRRIG 1000ML POUR BTL (IV SOLUTION) ×3 IMPLANT
PACK SURGICAL SETUP 50X90 (CUSTOM PROCEDURE TRAY) ×3 IMPLANT
PAD ARMBOARD 7.5X6 YLW CONV (MISCELLANEOUS) ×3 IMPLANT
PENCIL BUTTON HOLSTER BLD 10FT (ELECTRODE) ×3 IMPLANT
SET INTRODUCER 12FR PACEMAKER (INTRODUCER) IMPLANT
SET SHEATH INTRODUCER 10FR (MISCELLANEOUS) IMPLANT
SHEATH COOK PEEL AWAY SET 9F (SHEATH) IMPLANT
SURGILUBE 3G PEEL PACK STRL (MISCELLANEOUS) IMPLANT
SUT MNCRL AB 4-0 PS2 18 (SUTURE) ×3 IMPLANT
SUT PROLENE 2 0 CT2 30 (SUTURE) ×5 IMPLANT
SUT VIC AB 3-0 SH 18 (SUTURE) ×3 IMPLANT
SYR 10ML LL (SYRINGE) ×6 IMPLANT
SYR 5ML LUER SLIP (SYRINGE) ×3 IMPLANT
SYR CONTROL 10ML LL (SYRINGE) ×3 IMPLANT
TOWEL OR 17X24 6PK STRL BLUE (TOWEL DISPOSABLE) ×3 IMPLANT
TOWEL OR 17X26 10 PK STRL BLUE (TOWEL DISPOSABLE) ×3 IMPLANT
TUBE CONNECTING 12'X1/4 (SUCTIONS)
TUBE CONNECTING 12X1/4 (SUCTIONS) IMPLANT
YANKAUER SUCT BULB TIP NO VENT (SUCTIONS) IMPLANT

## 2018-02-12 NOTE — Op Note (Signed)
Patient Name:           Marcia [name]   Date of Surgery:        02/12/2018  Pre op Diagnosis:      Cancer right breast  Post op Diagnosis:    Cancer right breast  Procedure:                 Insertion of PowerPort ClearVue 8 French tunneled venous vascular access device                                      Use of fluoroscopy for guidance and positioning  Surgeon:                     Edsel Petrin. Dalbert Batman, M.D., FACS  Assistant:                      OR staff  Operative Indications:   This is a very pleasant 67 year old female, referred by Dr. [name] at [facility] for evaluation of new cancer right breast upper outer quadrant. Dr. [name] is her gynecologist. Dr. [name] is her PCP. Dr. 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     She has no prior history of breast problems. 6-12 months ago her mammograms showed a vague density in the medial right breast. She was called back in for follow-up. She says she did not feel a mass but thought that might be some swelling and nipple inversion on the right. The mass in the medial right breast was not seen. There was a vague 5 cm density in the right breast. The 5 cm span was from central to peripheral. This is not a discrete mass and looks more like lobular cancer than anything else. The left breast looked normal. The right axilla was normal by ultrasound.    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 ER and PR negative. Ki-67 75%.        MRI could not be done because of renal function.       Past history is significant for multiple myeloma in remission. 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 She had a dual lumen Hickman catheter on the right side. She went through a difficult treatment plan. Hypertension. Appendectomy. Partial hysterectomy. Laparoscopic tubal ligation. BMI 42. Family history is negative for breast or  ovarian cancer. .     Her case was discussed at multidisciplinary breast tumor board.  She has seen Dr. [name].  Neoadjuvant chemotherapy is advised and Dr. [name] requested Port-A-Cath.  I discussed the Port-A-Cath with her and her husband in detail.Marland Kitchen     Operative Findings:       Left subclavian venipuncture was performed easily.  Guidewire threaded easily.  C-arm imaging showed the catheter tip to be in the superior vena cava and we had good flow and blood good blood return.  Procedure in Detail:          Following the induction of a general LMA anesthesia the patient was positioned with a roll behind her shoulders and her arms tucked at her sides.  The neck and chest were prepped and draped in a sterile fashion.  Surgical timeout was performed.  Intravenous antibiotics were given.       0.5% Marcaine with epinephrine was used  as a local infiltration anesthetic.  Left subclavian venipuncture was performed with a single pass and good blood return.  The guidewire threaded easily and appeared to go into the right atrium with some arrhythmias and the wire was pulled back a little.  A small incision was made at the wire insertion site.  A transverse incision was then made in the left parasternal area below the clavicle.  I debrided some subcutaneous tissue.  Using a tunneling device I passed the catheter from the port pocket site to the wire insertion site.  I used the C-arm and drew a template on the chest wall to help position the tip of the catheter several centimeters below the carina.  The catheter was cut at 26 cm in length.  The catheter was secured to the port and then flushed with heparinized saline.  The port was sutured to the deep subcutaneous tissue with 3 interrupted sutures of 2-0 Prolene.  The dilator and peel-away sheath was inserted easily over the guidewire and the guidewire and dilator removed.  The catheter easily threaded and the peel-away sheath was removed.  The catheter flushed  easily and had blood return.  C-arm showed the catheter tip to be in the superior vena cava.      The port and catheter were then flushed with concentrated heparin solution.  Hemostasis was excellent.  Subcutaneous tissue was closed with 3-0 Vicryl sutures and the skin incisions closed with subcuticular 4-0 Monocryl and Dermabond.  Patient tolerated the procedure well was taken to PACU.  A chest x-ray is planned.  EBL 15 mL.  Counts correct.  Complications none.    Addendum: I logged onto the [employer] and reviewed her prescription medication history     Freddi Forster M. Dalbert Batman, M.D., FACS General and Minimally Invasive Surgery Breast and Colorectal Surgery  02/12/2018 8:57 AM

## 2018-02-12 NOTE — Transfer of Care (Signed)
Immediate Anesthesia Transfer of Care Note  Patient: Marcia [name]  Procedure(s) Performed: INSERTION PORT-A-CATH (Left )  Patient Location: PACU  Anesthesia Type:General  Level of Consciousness: awake, alert  and oriented  Airway & Oxygen Therapy: Patient Spontanous Breathing  Post-op Assessment: Report given to RN, Post -op Vital signs reviewed and stable and Patient moving all extremities X 4  Post vital signs: Reviewed and stable  Last Vitals:  Vitals Value Taken Time  BP    Temp    Pulse 96 02/12/2018  8:53 AM  Resp    SpO2 96 % 02/12/2018  8:53 AM  Vitals shown include unvalidated device data.  Last Pain:  Vitals:   02/12/18 0643  TempSrc:   PainSc: 0-No pain      Patients Stated Pain Goal: 3 ([id number])  Complications: No apparent anesthesia complications

## 2018-02-12 NOTE — Anesthesia Procedure Notes (Signed)
Procedure Name: LMA Insertion Date/Time: 02/12/2018 8:03 AM Performed by: Harden Mo, CRNA Pre-anesthesia Checklist: Patient identified, Emergency Drugs available, Suction available and Patient being monitored Patient Re-evaluated:Patient Re-evaluated prior to induction Oxygen Delivery Method: Circle System Utilized Preoxygenation: Pre-oxygenation with 100% oxygen Induction Type: IV induction LMA: LMA inserted LMA Size: 4.0 Number of attempts: 1 Airway Equipment and Method: Bite block Placement Confirmation: positive ETCO2 Tube secured with: Tape Dental Injury: Teeth and Oropharynx as per pre-operative assessment

## 2018-02-12 NOTE — Interval H&P Note (Signed)
History and Physical Interval Note:  02/12/2018 7:31 AM  Marcia [name]  has presented today for surgery, with the diagnosis of BREAST CANCER  The various methods of treatment have been discussed with the patient and family. After consideration of risks, benefits and other options for treatment, the patient has consented to  Procedure(s): INSERTION PORT-A-CATH WITH [place] (N/A) as a surgical intervention .  The patient's history has been reviewed, patient examined, no change in status, stable for surgery.  I have reviewed the patient's chart and labs.  Questions were answered to the patient's satisfaction.     [name]

## 2018-02-12 NOTE — Discharge Instructions (Signed)
    PORT-A-CATH: POST OP INSTRUCTIONS  Always review your discharge instruction sheet given to you by the facility where your surgery was performed.   1. A prescription for pain medication may be given to you upon discharge. Take your pain medication as prescribed, if needed. If narcotic pain medicine is not needed, then you make take acetaminophen (Tylenol) or ibuprofen (Advil) as needed.  2. Take your usually prescribed medications unless otherwise directed. 3. If you need a refill on your pain medication, please contact our office. All narcotic pain medicine now requires a paper prescription.  Phoned in and fax refills are no longer allowed by law.  Prescriptions will not be filled after 5 pm or on weekends.  4. You should follow a light diet for the remainder of the day after your procedure. 5. Most patients will experience some mild swelling and/or bruising in the area of the incision. It may take several days to resolve. 6. It is common to experience some constipation if taking pain medication after surgery. Increasing fluid intake and taking a stool softener (such as Colace) will usually help or prevent this problem from occurring. A mild laxative (Milk of Magnesia or Miralax) should be taken according to package directions if there are no bowel movements after 48 hours.  7. Unless discharge instructions indicate otherwise, you may remove your bandages 48 hours after surgery, and you may shower at that time. You may have steri-strips (small white skin tapes) in place directly over the incision.  These strips should be left on the skin for 7-10 days.  If your surgeon used Dermabond (skin glue) on the incision, you may shower in 24 hours.  The glue will flake off over the next 2-3 weeks.  8. If your port is left accessed at the end of surgery (needle left in port), the dressing cannot get wet and should only by changed by a healthcare professional. When the port is no longer accessed (when the  needle has been removed), follow step 7.   9. ACTIVITIES:  Limit activity involving your arms for the next 72 hours. Do no strenuous exercise or activity for 1 week. You may drive when you are no longer taking prescription pain medication, you can comfortably wear a seatbelt, and you can maneuver your car. 10.You may need to see your doctor in the office for a follow-up appointment.  Please       check with your doctor.  11.When you receive a new Port-a-Cath, you will get a product guide and        ID card.  Please keep them in case you need them.  WHEN TO CALL YOUR DOCTOR ([phone]): 1. Fever over 101.0 2. Chills 3. Continued bleeding from incision 4. Increased redness and tenderness at the site 5. Shortness of breath, difficulty breathing   The clinic staff is available to answer your questions during regular business hours. Please don't hesitate to call and ask to speak to one of the nurses or medical assistants for clinical concerns. If you have a medical emergency, go to the nearest emergency room or call 911.  A surgeon from [facility] is always on call at the hospital.     For further information, please visit [email or web address]      

## 2018-02-12 NOTE — Anesthesia Postprocedure Evaluation (Signed)
Anesthesia Post Note  Patient: Marcia [name]  Procedure(s) Performed: INSERTION PORT-A-CATH (Left )     Patient location during evaluation: PACU Anesthesia Type: General Level of consciousness: awake and alert Pain management: pain level controlled Vital Signs Assessment: post-procedure vital signs reviewed and stable Respiratory status: spontaneous breathing, nonlabored ventilation, respiratory function stable and patient connected to nasal cannula oxygen Cardiovascular status: blood pressure returned to baseline and stable Postop Assessment: no apparent nausea or vomiting Anesthetic complications: no    Last Vitals:  Vitals Value Taken Time  BP    Temp    Pulse    Resp    SpO2      Last Pain:  Vitals:   02/12/18 0953  TempSrc:   PainSc: 2                  Tiajuana Amass

## 2018-02-15 ENCOUNTER — Encounter (HOSPITAL_COMMUNITY): Payer: Self-pay | Admitting: General Surgery

## 2018-02-15 ENCOUNTER — Ambulatory Visit (HOSPITAL_BASED_OUTPATIENT_CLINIC_OR_DEPARTMENT_OTHER)
Admission: RE | Admit: 2018-02-15 | Discharge: 2018-02-15 | Disposition: A | Discharge status: Home / Self Care | Payer: 59 | Source: Ambulatory Visit | Attending: Hematology & Oncology | Admitting: Hematology & Oncology

## 2018-02-15 DIAGNOSIS — R93422 Abnormal radiologic findings on diagnostic imaging of left kidney: Secondary | ICD-10-CM | POA: Diagnosis not present

## 2018-02-15 DIAGNOSIS — C50911 Malignant neoplasm of unspecified site of right female breast: Secondary | ICD-10-CM | POA: Diagnosis not present

## 2018-02-15 DIAGNOSIS — R932 Abnormal findings on diagnostic imaging of liver and biliary tract: Secondary | ICD-10-CM | POA: Diagnosis not present

## 2018-02-16 ENCOUNTER — Inpatient Hospital Stay: Payer: 59

## 2018-02-16 ENCOUNTER — Other Ambulatory Visit: Payer: Self-pay

## 2018-02-16 ENCOUNTER — Inpatient Hospital Stay (HOSPITAL_BASED_OUTPATIENT_CLINIC_OR_DEPARTMENT_OTHER): Payer: 59 | Admitting: Family

## 2018-02-16 ENCOUNTER — Other Ambulatory Visit: Payer: Self-pay | Admitting: *Deleted

## 2018-02-16 ENCOUNTER — Encounter: Payer: Self-pay | Admitting: Family

## 2018-02-16 VITALS — BP 144/77 | HR 66 | Temp 98.3°F | Resp 18 | Wt 220.0 lb

## 2018-02-16 VITALS — BP 134/68 | HR 75 | Temp 98.3°F | Resp 18

## 2018-02-16 DIAGNOSIS — C50411 Malignant neoplasm of upper-outer quadrant of right female breast: Secondary | ICD-10-CM | POA: Diagnosis not present

## 2018-02-16 DIAGNOSIS — Z9221 Personal history of antineoplastic chemotherapy: Secondary | ICD-10-CM | POA: Diagnosis not present

## 2018-02-16 DIAGNOSIS — C9001 Multiple myeloma in remission: Secondary | ICD-10-CM

## 2018-02-16 DIAGNOSIS — Z171 Estrogen receptor negative status [ER-]: Secondary | ICD-10-CM

## 2018-02-16 DIAGNOSIS — K760 Fatty (change of) liver, not elsewhere classified: Secondary | ICD-10-CM | POA: Diagnosis not present

## 2018-02-16 DIAGNOSIS — Z7982 Long term (current) use of aspirin: Secondary | ICD-10-CM

## 2018-02-16 DIAGNOSIS — Z9484 Stem cells transplant status: Secondary | ICD-10-CM

## 2018-02-16 DIAGNOSIS — R5383 Other fatigue: Secondary | ICD-10-CM

## 2018-02-16 DIAGNOSIS — N289 Disorder of kidney and ureter, unspecified: Secondary | ICD-10-CM | POA: Diagnosis not present

## 2018-02-16 DIAGNOSIS — Z79899 Other long term (current) drug therapy: Secondary | ICD-10-CM

## 2018-02-16 DIAGNOSIS — C50911 Malignant neoplasm of unspecified site of right female breast: Secondary | ICD-10-CM

## 2018-02-16 LAB — CBC WITH DIFFERENTIAL (CANCER CENTER ONLY)
BASOS PCT: 0 %
Basophils Absolute: 0 10*3/uL (ref 0.0–0.1)
EOS ABS: 0 10*3/uL (ref 0.0–0.5)
EOS PCT: 0 %
HEMATOCRIT: 45 % (ref 34.8–46.6)
HEMOGLOBIN: 15 g/dL (ref 11.6–15.9)
LYMPHS ABS: 2.1 10*3/uL (ref 0.9–3.3)
Lymphocytes Relative: 17 %
MCH: 32 pg (ref 26.0–34.0)
MCHC: 33.3 g/dL (ref 32.0–36.0)
MCV: 95.9 fL (ref 81.0–101.0)
MONOS PCT: 1 %
Monocytes Absolute: 0.1 10*3/uL (ref 0.1–0.9)
Neutro Abs: 10.6 10*3/uL — ABNORMAL HIGH (ref 1.5–6.5)
Neutrophils Relative %: 82 %
Platelet Count: 202 10*3/uL (ref 145–400)
RBC: 4.69 MIL/uL (ref 3.70–5.32)
RDW: 12.9 % (ref 11.1–15.7)
WBC: 12.8 10*3/uL — AB (ref 3.9–10.0)

## 2018-02-16 LAB — CMP (CANCER CENTER ONLY)
ALK PHOS: 66 U/L (ref 26–84)
ALT: 36 U/L (ref 10–47)
AST: 33 U/L (ref 11–38)
Albumin: 3.6 g/dL (ref 3.5–5.0)
Anion gap: 12 (ref 5–15)
BUN: 26 mg/dL — AB (ref 7–22)
CALCIUM: 9.2 mg/dL (ref 8.0–10.3)
CHLORIDE: 109 mmol/L — AB (ref 98–108)
CO2: 21 mmol/L (ref 18–33)
Creatinine: 1.3 mg/dL — ABNORMAL HIGH (ref 0.60–1.20)
Glucose, Bld: 176 mg/dL — ABNORMAL HIGH (ref 73–118)
Potassium: 4.1 mmol/L (ref 3.3–4.7)
SODIUM: 142 mmol/L (ref 128–145)
Total Bilirubin: 0.6 mg/dL (ref 0.2–1.6)
Total Protein: 7.2 g/dL (ref 6.4–8.1)

## 2018-02-16 MED ORDER — SODIUM CHLORIDE 0.9 % IV SOLN
67.5000 mg/m2 | Freq: Once | INTRAVENOUS | Status: AC
  Administered 2018-02-16: 140 mg via INTRAVENOUS
  Filled 2018-02-16: qty 14

## 2018-02-16 MED ORDER — LORAZEPAM 0.5 MG PO TABS: 0.5000 mg | ORAL_TABLET | Freq: Four times a day (QID) | ORAL | 0 refills | Status: DC | PRN

## 2018-02-16 MED ORDER — TRASTUZUMAB CHEMO 150 MG IV SOLR
8.0000 mg/kg | Freq: Once | INTRAVENOUS | Status: AC
  Administered 2018-02-16: 798 mg via INTRAVENOUS
  Filled 2018-02-16: qty 38

## 2018-02-16 MED ORDER — LIDOCAINE-PRILOCAINE 2.5-2.5 % EX CREA: TOPICAL_CREAM | CUTANEOUS | 3 refills | Status: DC

## 2018-02-16 MED ORDER — PALONOSETRON HCL INJECTION 0.25 MG/5ML
0.2500 mg | Freq: Once | INTRAVENOUS | Status: AC
  Administered 2018-02-16: 0.25 mg via INTRAVENOUS

## 2018-02-16 MED ORDER — PALONOSETRON HCL INJECTION 0.25 MG/5ML
INTRAVENOUS | Status: AC
  Filled 2018-02-16: qty 5

## 2018-02-16 MED ORDER — FOSAPREPITANT DIMEGLUMINE INJECTION 150 MG
Freq: Once | INTRAVENOUS | Status: AC
  Administered 2018-02-16: 15:00:00 via INTRAVENOUS
  Filled 2018-02-16: qty 5

## 2018-02-16 MED ORDER — SODIUM CHLORIDE 0.9 % IV SOLN
840.0000 mg | Freq: Once | INTRAVENOUS | Status: AC
  Administered 2018-02-16: 840 mg via INTRAVENOUS
  Filled 2018-02-16: qty 28

## 2018-02-16 MED ORDER — PROCHLORPERAZINE MALEATE 10 MG PO TABS: 10.0000 mg | ORAL_TABLET | Freq: Four times a day (QID) | ORAL | 1 refills | Status: DC | PRN

## 2018-02-16 MED ORDER — DIPHENHYDRAMINE HCL 25 MG PO CAPS
50.0000 mg | ORAL_CAPSULE | Freq: Once | ORAL | Status: AC
  Administered 2018-02-16: 50 mg via ORAL

## 2018-02-16 MED ORDER — CARBOPLATIN CHEMO INJECTION 600 MG/60ML
400.0000 mg | Freq: Once | INTRAVENOUS | Status: AC
  Administered 2018-02-16: 400 mg via INTRAVENOUS
  Filled 2018-02-16: qty 40

## 2018-02-16 MED ORDER — ACETAMINOPHEN 325 MG PO TABS
ORAL_TABLET | ORAL | Status: AC
  Filled 2018-02-16: qty 2

## 2018-02-16 MED ORDER — ONDANSETRON HCL 8 MG PO TABS: 8.0000 mg | ORAL_TABLET | Freq: Two times a day (BID) | ORAL | 1 refills | Status: DC | PRN

## 2018-02-16 MED ORDER — DIPHENHYDRAMINE HCL 25 MG PO CAPS
ORAL_CAPSULE | ORAL | Status: AC
  Filled 2018-02-16: qty 2

## 2018-02-16 MED ORDER — ACETAMINOPHEN 325 MG PO TABS
650.0000 mg | ORAL_TABLET | Freq: Once | ORAL | Status: AC
  Administered 2018-02-16: 650 mg via ORAL

## 2018-02-16 MED ORDER — SODIUM CHLORIDE 0.9 % IV SOLN
Freq: Once | INTRAVENOUS | Status: AC
Start: 2018-02-16 — End: 2018-02-16
  Administered 2018-02-16: 10:00:00 via INTRAVENOUS

## 2018-02-16 NOTE — Progress Notes (Signed)
Leave carboplatin at current dose today per Dr. Benay Spice. SCr changed from 1.37 to 1.3.

## 2018-02-16 NOTE — Patient Instructions (Signed)
PICK UP ZOFRAN, COMPAZINE AND ATIVAN FROM [facility] AND THEN FOLLOW THE INSTRUCTIONS BELOW FOR TAKING THESE MEDICATIONS.  MAY TAKE CLARITIN FOR BONE PAIN RELATED TO THE SHOT YOU ARE GOING TO GET ON 3/28, AND THAT IS TO TAKE ONE CLARITIN OR GENERIC 10MG  ANYTIME OF DAY STARTING ON 3/27 AND FOR 3 DAYS AFTER THE SHOT ALSO.   [place] Discharge Instructions for Patients Receiving Chemotherapy  Today you received the following chemotherapy agents HERCEPTIN, PERJETA, TAXOTERE AND CARBOPLATIN.  To help prevent nausea and vomiting after your treatment, we encourage you to take your nausea medication as directed BUT NO ZOFRAN FOR TODAY AND FOR 2 DAYS FOLLOWING, MUST TAKE YOUR COMPAZINE ONLY DURING THAT TIME.   If you develop nausea and vomiting that is not controlled by your nausea medication, call the clinic.   BELOW ARE SYMPTOMS THAT SHOULD BE REPORTED IMMEDIATELY:  *FEVER GREATER THAN 100.5 F  *CHILLS WITH OR WITHOUT FEVER  NAUSEA AND VOMITING THAT IS NOT CONTROLLED WITH YOUR NAUSEA MEDICATION  *UNUSUAL SHORTNESS OF BREATH  *UNUSUAL BRUISING OR BLEEDING  TENDERNESS IN MOUTH AND THROAT WITH OR WITHOUT PRESENCE OF ULCERS  *URINARY PROBLEMS  *BOWEL PROBLEMS  UNUSUAL RASH Items with * indicate a potential emergency and should be followed up as soon as possible.  Feel free to call the clinic you have any questions or concerns. The clinic phone number is [phone].  Please show the [facility] at check-in to the Emergency Department and triage nurse.  Pegfilgrastim injection What is this medicine? PEGFILGRASTIM (PEG fil gra stim) is a long-acting granulocyte colony-stimulating factor that stimulates the growth of neutrophils, a type of white blood cell important in the body's fight against infection. It is used to reduce the incidence of fever and infection in patients with certain types of cancer who are receiving chemotherapy that affects the bone marrow, and  to increase survival after being exposed to high doses of radiation. This medicine may be used for other purposes; ask your health care provider or pharmacist if you have questions. COMMON BRAND NAME(S): Neulasta What should I tell my health care provider before I take this medicine? They need to know if you have any of these conditions: -kidney disease -latex allergy -ongoing radiation therapy -sickle cell disease -skin reactions to acrylic adhesives (On-Body Injector only) -an unusual or allergic reaction to pegfilgrastim, filgrastim, other medicines, foods, dyes, or preservatives -pregnant or trying to get pregnant -breast-feeding How should I use this medicine? This medicine is for injection under the skin. If you get this medicine at home, you will be taught how to prepare and give the pre-filled syringe or how to use the On-body Injector. Refer to the patient Instructions for Use for detailed instructions. Use exactly as directed. Tell your healthcare provider immediately if you suspect that the On-body Injector may not have performed as intended or if you suspect the use of the On-body Injector resulted in a missed or partial dose. It is important that you put your used needles and syringes in a special sharps container. Do not put them in a trash can. If you do not have a sharps container, call your pharmacist or healthcare provider to get one. Talk to your pediatrician regarding the use of this medicine in children. While this drug may be prescribed for selected conditions, precautions do apply. Overdosage: If you think you have taken too much of this medicine contact a poison control center or emergency room at once. NOTE:  This medicine is only for you. Do not share this medicine with others. What if I miss a dose? It is important not to miss your dose. Call your doctor or health care professional if you miss your dose. If you miss a dose due to an On-body Injector failure or leakage, a  new dose should be administered as soon as possible using a single prefilled syringe for manual use. What may interact with this medicine? Interactions have not been studied. Give your health care provider a list of all the medicines, herbs, non-prescription drugs, or dietary supplements you use. Also tell them if you smoke, drink alcohol, or use illegal drugs. Some items may interact with your medicine. This list may not describe all possible interactions. Give your health care provider a list of all the medicines, herbs, non-prescription drugs, or dietary supplements you use. Also tell them if you smoke, drink alcohol, or use illegal drugs. Some items may interact with your medicine. What should I watch for while using this medicine? You may need blood work done while you are taking this medicine. If you are going to need a MRI, CT scan, or other procedure, tell your doctor that you are using this medicine (On-Body Injector only). What side effects may I notice from receiving this medicine? Side effects that you should report to your doctor or health care professional as soon as possible: -allergic reactions like skin rash, itching or hives, swelling of the face, lips, or tongue -dizziness -fever -pain, redness, or irritation at site where injected -pinpoint red spots on the skin -red or dark-brown urine -shortness of breath or breathing problems -stomach or side pain, or pain at the shoulder -swelling -tiredness -trouble passing urine or change in the amount of urine Side effects that usually do not require medical attention (report to your doctor or health care professional if they continue or are bothersome): -bone pain -muscle pain This list may not describe all possible side effects. Call your doctor for medical advice about side effects. You may report side effects to FDA at 1-800-FDA-1088. Where should I keep my medicine? Keep out of the reach of children. Store pre-filled  syringes in a refrigerator between 2 and 8 degrees C (36 and 46 degrees F). Do not freeze. Keep in carton to protect from light. Throw away this medicine if it is left out of the refrigerator for more than 48 hours. Throw away any unused medicine after the expiration date. NOTE: This sheet is a summary. It may not cover all possible information. If you have questions about this medicine, talk to your doctor, pharmacist, or health care provider.  2018 Elsevier/Gold Standard (2016-11-06 12:58:03)

## 2018-02-16 NOTE — Progress Notes (Signed)
Hematology and Oncology Follow Up Visit  ARVIE VILLARRUEL [id number] 1951/04/09 67 y.o. 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 Athens is here today with her husband for follow-up and her first cycle of treatment. She is feeling a little fatigued at times.  She had an abdominal US yesterday which shower fatty liver and a 1.3 cm lesion on her left kidney. I spoke with Dr. [name] and he does not feel she needs an MRI at this time so we will hold off.  No M-spike in February. Kappa light chain was stable at 3.77 mg/dL.  Her left chest port a cath site is healing nicely. No redness, edema or discharge. She would like to wait a little longer for it to heal before we use it for treatment.   No fever, chills, n/v, cough, rash, dizziness, chest pain, palpitations, abdominal pain or changes in bowel or bladder habits.  She has occasional SOB with over exertion (stairs) and will take breaks to rest as needed.  She is walking some daily for exercise.  No swelling, tenderness, numbness or tingling in her extremities at this time.  No bleeding, no bruising or petechiae.  No lymphadenopathy noted on exam.  She has maintained a good appetite and is staying well hydrated. Her weight is stable.   ECOG Performance Status: 1 - Symptomatic but completely ambulatory  Medications:  Allergies as of 02/16/2018      Reactions   Clarithromycin Diarrhea, Nausea And Vomiting      Medication List        Accurate as of 02/16/18  9:13 AM. Always use your most recent med list.          aspirin EC 81 MG tablet Take 81 mg by mouth daily.   COSAMIN ASU ADVANCED FORMULA PO Take 2 capsules by mouth  daily.   dexamethasone 4 MG tablet Commonly known as:  DECADRON Take 2 pills twice a day for 5 days.  Start day before [facility] chemotherapy cycle.   doxylamine (Sleep) 25 MG tablet Commonly known as:  UNISOM Take 25 mg by mouth at bedtime as needed for sleep.   HYDROcodone-acetaminophen 5-325 MG tablet Commonly known as:  NORCO Take 1-2 tablets by mouth every 6 (six) hours as needed for moderate pain or severe pain.   losartan 25 MG tablet Commonly known as:  COZAAR Take 25 mg by mouth daily.       Allergies:  Allergies  Allergen Reactions  . Clarithromycin Diarrhea and Nausea And Vomiting    Past Medical History, Surgical history, Social history, and Family History were reviewed and updated.  Review of Systems: All other 10 point review of systems is negative.   Physical Exam:  weight is 220 lb (99.8 kg). Her oral temperature is 98.3 F (36.8 C). Her blood pressure is 144/77 (abnormal) and her pulse is 66. Her respiration is 18 and oxygen saturation is 96%.   Wt Readings from Last 3 Encounters:  02/16/18 220 lb (99.8 kg)  02/01/18 218 lb (98.9 kg)  01/04/18 217 lb 12.8 oz (98.8 kg)    Ocular: Sclerae unicteric, pupils equal, round and reactive to light Ear-nose-throat: Oropharynx clear, dentition fair Lymphatic: No cervical, supraclavicular or axillary adenopathy Lungs no rales  or rhonchi, good excursion bilaterally Heart regular rate and rhythm, no murmur appreciated Abd soft, nontender, positive bowel sounds, No liver or spleen tip palpated on exam, no fluid wave  MSK no focal spinal tenderness, no joint edema Neuro: non-focal, well-oriented, appropriate affect Breasts: No changes this visit  Lab Results  Component Value Date   WBC 12.8 (H) 02/16/2018   HGB 14.3 02/12/2018   HCT 45.0 02/16/2018   MCV 95.9 02/16/2018   PLT 202 02/16/2018   Lab Results  Component Value Date   FERRITIN 338 (H) 07/31/2011   IRON 36 (L) 03/21/2009   TIBC 232 (L) 03/21/2009     UIBC 196 03/21/2009   IRONPCTSAT 16 (L) 03/21/2009   Lab Results  Component Value Date   RETICCTPCT 1.3 03/27/2011   RBC 4.69 02/16/2018   RETICCTABS 56.4 03/27/2011   Lab Results  Component Value Date   KPAFRELGTCHN 37.7 (H) 01/04/2018   LAMBDASER 23.1 01/04/2018   KAPLAMBRATIO 1.63 01/04/2018   Lab Results  Component Value Date   IGGSERUM 978 01/04/2018   IGA 229 01/04/2018   IGMSERUM 130 01/04/2018   Lab Results  Component Value Date   TOTALPROTELP 6.8 01/04/2018   ALBUMINELP 3.6 01/04/2018   A1GS 0.1 01/04/2018   A2GS 0.8 01/04/2018   BETS 1.3 01/04/2018   BETA2SER 0.4 07/02/2015   GAMS 1.0 01/04/2018   MSPIKE Not Observed 01/04/2018   SPEI * 07/02/2015     Chemistry      Component Value Date/Time   NA 142 02/16/2018 0846   NA 141 07/06/2017 0816   NA 141 01/05/2017 0956   K 4.1 02/16/2018 0846   K 3.6 07/06/2017 0816   K 4.3 01/05/2017 0956   CL 109 (H) 02/16/2018 0846   CL 101 07/06/2017 0816   CO2 21 02/16/2018 0846   CO2 28 07/06/2017 0816   CO2 23 01/05/2017 0956   BUN 26 (H) 02/16/2018 0846   BUN 28 (H) 07/06/2017 0816   BUN 22.4 01/05/2017 0956   CREATININE 1.30 (H) 02/16/2018 0846   CREATININE 1.6 (H) 07/06/2017 0816   CREATININE 1.4 (H) 01/05/2017 0956      Component Value Date/Time   CALCIUM 9.2 02/16/2018 0846   CALCIUM 9.1 07/06/2017 0816   CALCIUM 9.3 01/05/2017 0956   ALKPHOS 66 02/16/2018 0846   ALKPHOS 58 07/06/2017 0816   ALKPHOS 52 01/05/2017 0956   AST 33 02/16/2018 0846   AST 14 01/05/2017 0956   ALT 36 02/16/2018 0846   ALT 58 (H) 07/06/2017 0816   ALT 13 01/05/2017 0956   BILITOT 0.6 02/16/2018 0846   BILITOT 0.56 01/05/2017 0956      Impression and Plan: Ms. Woods is a very pleasant 67 yo caucasian female with past history of kappa light chain myeloma with stem cell transplant. She has now been diagnosed with locally advanced infiltrating ductal carcinoma of the right breast, ER(-)/PR(-)/HER-2(+), Ki67 of 75%.   She is doing fairly well aside from some mild fatigue and is ready to start treatment.  We will proceed with day 1 of cycle 1 today as planned.  We will see her back in another 3 weeks for follow-up and treatment.  She will contact our office with any questions or concerns. We can certainly see her sooner if need be.   Laverna Peace, NP 3/26/20199:13 AM

## 2018-02-16 NOTE — Progress Notes (Signed)
Patient reports slight discomfort at IV site at 1700, no erythema but some tenderness just lateral to IV cannula, following the path of the vein, no obvious edema or infiltration. IV still giving good blood return but was removed to avoid complications. Ice pack applied to site #1 as directed in treatment plan. IV site #2 obtained immediately.

## 2018-02-17 LAB — KAPPA/LAMBDA LIGHT CHAINS
Kappa free light chain: 23.1 mg/L — ABNORMAL HIGH (ref 3.3–19.4)
Kappa, lambda light chain ratio: 1.46 (ref 0.26–1.65)
Lambda free light chains: 15.8 mg/L (ref 5.7–26.3)

## 2018-02-17 LAB — IGG, IGA, IGM
IGA: 222 mg/dL (ref 87–352)
IGG (IMMUNOGLOBIN G), SERUM: 924 mg/dL (ref 700–1600)
IGM (IMMUNOGLOBULIN M), SRM: 126 mg/dL (ref 26–217)

## 2018-02-18 ENCOUNTER — Other Ambulatory Visit: Payer: Self-pay

## 2018-02-18 ENCOUNTER — Inpatient Hospital Stay: Payer: 59

## 2018-02-18 ENCOUNTER — Encounter (HOSPITAL_COMMUNITY): Payer: Self-pay | Admitting: General Surgery

## 2018-02-18 ENCOUNTER — Encounter: Payer: Self-pay | Admitting: Hematology & Oncology

## 2018-02-18 VITALS — BP 155/66 | HR 68 | Temp 98.2°F | Resp 18

## 2018-02-18 DIAGNOSIS — C50911 Malignant neoplasm of unspecified site of right female breast: Secondary | ICD-10-CM

## 2018-02-18 DIAGNOSIS — C50411 Malignant neoplasm of upper-outer quadrant of right female breast: Secondary | ICD-10-CM | POA: Diagnosis not present

## 2018-02-18 LAB — PROTEIN ELECTROPHORESIS, SERUM, WITH REFLEX
A/G Ratio: 1.1 (ref 0.7–1.7)
ALPHA-2-GLOBULIN: 0.8 g/dL (ref 0.4–1.0)
Albumin ELP: 3.6 g/dL (ref 2.9–4.4)
Alpha-1-Globulin: 0.2 g/dL (ref 0.0–0.4)
BETA GLOBULIN: 1.3 g/dL (ref 0.7–1.3)
GLOBULIN, TOTAL: 3.3 g/dL (ref 2.2–3.9)
Gamma Globulin: 1 g/dL (ref 0.4–1.8)
Total Protein ELP: 6.9 g/dL (ref 6.0–8.5)

## 2018-02-18 MED ORDER — PEGFILGRASTIM-CBQV 6 MG/0.6ML ~~LOC~~ SOSY
6.0000 mg | PREFILLED_SYRINGE | Freq: Once | SUBCUTANEOUS | Status: AC
  Administered 2018-02-18: 6 mg via SUBCUTANEOUS
  Filled 2018-02-18: qty 0.6

## 2018-02-18 MED ORDER — PEGFILGRASTIM INJECTION 6 MG/0.6ML ~~LOC~~
PREFILLED_SYRINGE | SUBCUTANEOUS | Status: AC
  Filled 2018-02-18: qty 0.6

## 2018-02-23 ENCOUNTER — Telehealth: Payer: Self-pay | Admitting: *Deleted

## 2018-02-23 NOTE — Telephone Encounter (Signed)
Patient received chemo treatment on 02/18/2018. She is c/o being fatigued. She is sleeping all night and sleeps often during the day. She wants to know if this is okay. She denies any other symptoms.  Reviewed that fatigue can be common after chemo. Encouraged patient to get up and walk around her home for 5-10 minutes every hour. Explained that sitting or lying in bed all day can make fatigue worse.  Also reviewed the importance of a healthy diet and increased fluid intake. Finally, reviewed with patient that she is heading into her nadir and that symptoms are usually most severe at this time. Explained that she should start feeling better this weekend or early next week. She understands these instructions.

## 2018-03-03 ENCOUNTER — Telehealth: Payer: Self-pay | Admitting: *Deleted

## 2018-03-03 NOTE — Telephone Encounter (Signed)
Patient c/o symptoms of UTI. She has urgency and pain with urination. She denies fever. She also has some vaginal itching. She went to an urgent care last pm and was diagnosed with a UTI. She was placed on macrobid.   When speaking to the patient, she also mentions a reddened area to her arm where she had an IV on 02/16/2018 during her chemo infusion. She had no symptoms until last night when it became red. Reviewed with Judson Roch. Asked patient to take a picture of the site and send it via [place]. She agrees.

## 2018-03-09 ENCOUNTER — Other Ambulatory Visit: Payer: Self-pay

## 2018-03-09 ENCOUNTER — Inpatient Hospital Stay: Payer: 59

## 2018-03-09 ENCOUNTER — Inpatient Hospital Stay (HOSPITAL_BASED_OUTPATIENT_CLINIC_OR_DEPARTMENT_OTHER): Payer: 59 | Admitting: Hematology & Oncology

## 2018-03-09 ENCOUNTER — Inpatient Hospital Stay: Payer: 59 | Attending: Hematology & Oncology

## 2018-03-09 VITALS — Wt 212.0 lb

## 2018-03-09 DIAGNOSIS — Z87440 Personal history of urinary (tract) infections: Secondary | ICD-10-CM | POA: Diagnosis not present

## 2018-03-09 DIAGNOSIS — C50411 Malignant neoplasm of upper-outer quadrant of right female breast: Secondary | ICD-10-CM

## 2018-03-09 DIAGNOSIS — Z7689 Persons encountering health services in other specified circumstances: Secondary | ICD-10-CM | POA: Diagnosis not present

## 2018-03-09 DIAGNOSIS — Z9221 Personal history of antineoplastic chemotherapy: Secondary | ICD-10-CM | POA: Insufficient documentation

## 2018-03-09 DIAGNOSIS — Z79899 Other long term (current) drug therapy: Secondary | ICD-10-CM | POA: Diagnosis not present

## 2018-03-09 DIAGNOSIS — C9001 Multiple myeloma in remission: Secondary | ICD-10-CM

## 2018-03-09 DIAGNOSIS — Z9484 Stem cells transplant status: Secondary | ICD-10-CM

## 2018-03-09 DIAGNOSIS — C50911 Malignant neoplasm of unspecified site of right female breast: Secondary | ICD-10-CM

## 2018-03-09 DIAGNOSIS — Z171 Estrogen receptor negative status [ER-]: Secondary | ICD-10-CM | POA: Insufficient documentation

## 2018-03-09 LAB — CBC WITH DIFFERENTIAL (CANCER CENTER ONLY)
Basophils Absolute: 0 K/uL (ref 0.0–0.1)
Basophils Relative: 0 %
Eosinophils Absolute: 0 K/uL (ref 0.0–0.5)
Eosinophils Relative: 0 %
HCT: 37.4 % (ref 34.8–46.6)
Hemoglobin: 12.7 g/dL (ref 11.6–15.9)
Lymphocytes Relative: 18 %
Lymphs Abs: 3.3 K/uL (ref 0.9–3.3)
MCH: 32.1 pg (ref 26.0–34.0)
MCHC: 34 g/dL (ref 32.0–36.0)
MCV: 94.4 fL (ref 81.0–101.0)
Monocytes Absolute: 0.6 K/uL (ref 0.1–0.9)
Monocytes Relative: 3 %
Neutro Abs: 14.8 K/uL — ABNORMAL HIGH (ref 1.5–6.5)
Neutrophils Relative %: 79 %
Platelet Count: 314 K/uL (ref 145–400)
RBC: 3.96 MIL/uL (ref 3.70–5.32)
RDW: 13.9 % (ref 11.1–15.7)
WBC Count: 18.7 K/uL — ABNORMAL HIGH (ref 3.9–10.0)

## 2018-03-09 LAB — CMP (CANCER CENTER ONLY)
ALT: 28 U/L (ref 10–47)
AST: 20 U/L (ref 11–38)
Albumin: 3.6 g/dL (ref 3.5–5.0)
Alkaline Phosphatase: 72 U/L (ref 26–84)
Anion gap: 9 (ref 5–15)
BUN: 22 mg/dL (ref 7–22)
CO2: 25 mmol/L (ref 18–33)
Calcium: 9.1 mg/dL (ref 8.0–10.3)
Chloride: 107 mmol/L (ref 98–108)
Creatinine: 1.6 mg/dL — ABNORMAL HIGH (ref 0.60–1.20)
Glucose, Bld: 123 mg/dL — ABNORMAL HIGH (ref 73–118)
Potassium: 3.7 mmol/L (ref 3.3–4.7)
Sodium: 141 mmol/L (ref 128–145)
Total Bilirubin: 0.5 mg/dL (ref 0.2–1.6)
Total Protein: 6.5 g/dL (ref 6.4–8.1)

## 2018-03-09 MED ORDER — SODIUM CHLORIDE 0.9 % IV SOLN
67.5000 mg/m2 | Freq: Once | INTRAVENOUS | Status: AC
  Administered 2018-03-09: 140 mg via INTRAVENOUS
  Filled 2018-03-09: qty 14

## 2018-03-09 MED ORDER — ACETAMINOPHEN 325 MG PO TABS
ORAL_TABLET | ORAL | Status: AC
  Filled 2018-03-09: qty 2

## 2018-03-09 MED ORDER — TRASTUZUMAB CHEMO 150 MG IV SOLR
600.0000 mg | Freq: Once | INTRAVENOUS | Status: AC
  Administered 2018-03-09: 600 mg via INTRAVENOUS
  Filled 2018-03-09: qty 28.57

## 2018-03-09 MED ORDER — DIPHENHYDRAMINE HCL 25 MG PO CAPS
ORAL_CAPSULE | ORAL | Status: AC
  Filled 2018-03-09: qty 2

## 2018-03-09 MED ORDER — DIPHENHYDRAMINE HCL 25 MG PO CAPS
50.0000 mg | ORAL_CAPSULE | Freq: Once | ORAL | Status: AC
  Administered 2018-03-09: 50 mg via ORAL

## 2018-03-09 MED ORDER — SODIUM CHLORIDE 0.9 % IV SOLN
Freq: Once | INTRAVENOUS | Status: AC
  Administered 2018-03-09: 09:00:00 via INTRAVENOUS

## 2018-03-09 MED ORDER — ACETAMINOPHEN 325 MG PO TABS
650.0000 mg | ORAL_TABLET | Freq: Once | ORAL | Status: AC
  Administered 2018-03-09: 650 mg via ORAL

## 2018-03-09 MED ORDER — CARBOPLATIN CHEMO INJECTION 600 MG/60ML
400.0000 mg | Freq: Once | INTRAVENOUS | Status: AC
  Administered 2018-03-09: 400 mg via INTRAVENOUS
  Filled 2018-03-09: qty 40

## 2018-03-09 MED ORDER — SODIUM CHLORIDE 0.9 % IV SOLN
420.0000 mg | Freq: Once | INTRAVENOUS | Status: AC
  Administered 2018-03-09: 420 mg via INTRAVENOUS
  Filled 2018-03-09: qty 14

## 2018-03-09 MED ORDER — ACETAMINOPHEN 325 MG PO TABS
ORAL_TABLET | ORAL | Status: AC
  Filled 2018-03-09: qty 1

## 2018-03-09 MED ORDER — HEPARIN SOD (PORK) LOCK FLUSH 100 UNIT/ML IV SOLN
500.0000 [IU] | Freq: Once | INTRAVENOUS | Status: AC | PRN
  Administered 2018-03-09: 500 [IU]
  Filled 2018-03-09: qty 5

## 2018-03-09 MED ORDER — SODIUM CHLORIDE 0.9% FLUSH
10.0000 mL | INTRAVENOUS | Status: DC | PRN
  Administered 2018-03-09: 10 mL
  Filled 2018-03-09: qty 10

## 2018-03-09 MED ORDER — PALONOSETRON HCL INJECTION 0.25 MG/5ML
INTRAVENOUS | Status: AC
  Filled 2018-03-09: qty 5

## 2018-03-09 MED ORDER — PALONOSETRON HCL INJECTION 0.25 MG/5ML
0.2500 mg | Freq: Once | INTRAVENOUS | Status: AC
  Administered 2018-03-09: 0.25 mg via INTRAVENOUS

## 2018-03-09 MED ORDER — SODIUM CHLORIDE 0.9 % IV SOLN
Freq: Once | INTRAVENOUS | Status: AC
  Administered 2018-03-09: 12:00:00 via INTRAVENOUS
  Filled 2018-03-09: qty 5

## 2018-03-09 NOTE — Patient Instructions (Signed)
Implanted Port Home Guide An implanted port is a type of central line that is placed under the skin. Central lines are used to provide IV access when treatment or nutrition needs to be given through a person's veins. Implanted ports are used for long-term IV access. An implanted port may be placed because:  You need IV medicine that would be irritating to the small veins in your hands or arms.  You need long-term IV medicines, such as antibiotics.  You need IV nutrition for a long period.  You need frequent blood draws for lab tests.  You need dialysis.  Implanted ports are usually placed in the chest area, but they can also be placed in the upper arm, the abdomen, or the leg. An implanted port has two main parts:  Reservoir. The reservoir is round and will appear as a small, raised area under your skin. The reservoir is the part where a needle is inserted to give medicines or draw blood.  Catheter. The catheter is a thin, flexible tube that extends from the reservoir. The catheter is placed into a large vein. Medicine that is inserted into the reservoir goes into the catheter and then into the vein.  How will I care for my incision site? Do not get the incision site wet. Bathe or shower as directed by your health care provider. How is my port accessed? Special steps must be taken to access the port:  Before the port is accessed, a numbing cream can be placed on the skin. This helps numb the skin over the port site.  Your health care provider uses a sterile technique to access the port. ? Your health care provider must put on a mask and sterile gloves. ? The skin over your port is cleaned carefully with an antiseptic and allowed to dry. ? The port is gently pinched between sterile gloves, and a needle is inserted into the port.  Only "non-coring" port needles should be used to access the port. Once the port is accessed, a blood return should be checked. This helps ensure that the port  is in the vein and is not clogged.  If your port needs to remain accessed for a constant infusion, a clear (transparent) bandage will be placed over the needle site. The bandage and needle will need to be changed every week, or as directed by your health care provider.  Keep the bandage covering the needle clean and dry. Do not get it wet. Follow your health care provider's instructions on how to take a shower or bath while the port is accessed.  If your port does not need to stay accessed, no bandage is needed over the port.  What is flushing? Flushing helps keep the port from getting clogged. Follow your health care provider's instructions on how and when to flush the port. Ports are usually flushed with saline solution or a medicine called heparin. The need for flushing will depend on how the port is used.  If the port is used for intermittent medicines or blood draws, the port will need to be flushed: ? After medicines have been given. ? After blood has been drawn. ? As part of routine maintenance.  If a constant infusion is running, the port may not need to be flushed.  How long will my port stay implanted? The port can stay in for as long as your health care provider thinks it is needed. When it is time for the port to come out, surgery will be   done to remove it. The procedure is similar to the one performed when the port was put in. When should I seek immediate medical care? When you have an implanted port, you should seek immediate medical care if:  You notice a bad smell coming from the incision site.  You have swelling, redness, or drainage at the incision site.  You have more swelling or pain at the port site or the surrounding area.  You have a fever that is not controlled with medicine.  This information is not intended to replace advice given to you by your health care provider. Make sure you discuss any questions you have with your health care provider. Document  Released: 11/10/2005 Document Revised: 04/17/2016 Document Reviewed: 07/18/2013 Elsevier Interactive Patient Education  2017 [employer]  

## 2018-03-09 NOTE — Progress Notes (Signed)
Hematology and Oncology Follow Up Visit  Marcia [name] [id number] 08/17/1951 67 y.o. 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  She actually tolerated the first cycle of chemotherapy quite nicely.  She really did not have much in the way of nausea or vomiting.  She has had no diarrhea.  There is been no bleeding.  She is had no leg swelling.  She has had no cough.  She is had no shortness of breath.  She did have a urinary tract infection.  She was put on Macrobid.  She developed an allergy to [place].  We will have to make sure that this is entered into the chart as an allergy.    She is starting to lose her hair..  She now has a Port-A-Cath in place.  Overall, her performance status is ECOG 1.     Medications:  Allergies as of 03/09/2018      Reactions   Clarithromycin Diarrhea, Nausea And Vomiting      Medication List        Accurate as of 03/09/18  9:28 AM. Always use your most recent med list.          aspirin EC 81 MG tablet Take 81 mg by mouth daily.   COSAMIN ASU ADVANCED FORMULA PO Take 2 capsules by mouth daily.   dexamethasone 4 MG tablet Commonly known as:  DECADRON Take 2 pills twice a day for 5 days.  Start day before Marcia [name] chemotherapy cycle.   doxylamine (Sleep) 25 MG tablet Commonly known as:  UNISOM Take 25 mg by mouth at bedtime as needed for sleep.   HYDROcodone-acetaminophen 5-325 MG tablet Commonly known as:  NORCO Take 1-2 tablets by mouth every 6 (six) hours as needed for moderate pain or severe pain.   lidocaine-prilocaine cream Commonly known as:  EMLA Apply  to affected area once   LORazepam 0.5 MG tablet Commonly known as:  ATIVAN Take 1 tablet (0.5 mg total) by mouth every 6 (six) hours as needed (Nausea or vomiting).   losartan 25 MG tablet Commonly known as:  COZAAR Take 25 mg by mouth daily.   nitrofurantoin (macrocrystal-monohydrate) 100 MG capsule Commonly known as:  MACROBID Take 100 mg by mouth 2 (two) times daily. for 7 days   ondansetron 8 MG tablet Commonly known as:  ZOFRAN Take 1 tablet (8 mg total) by mouth 2 (two) times daily as needed (Nausea or vomiting). Begin 4 days after chemotherapy.   prochlorperazine 10 MG tablet Commonly known as:  COMPAZINE Take 1 tablet (10 mg total) by mouth every 6 (six) hours as needed (Nausea or vomiting).       Allergies:  Allergies  Allergen Reactions  . Clarithromycin Diarrhea and Nausea And Vomiting    Past Medical History, Surgical history, Social history, and Family History were reviewed and updated.  Review of Systems: Review of Systems  Constitutional: Negative.   HENT: Negative.   Eyes: Negative.   Respiratory: Negative.   Cardiovascular: Negative.   Gastrointestinal: Negative.   Genitourinary: Negative.  Negative for dysuria.  Musculoskeletal: Negative.   Skin: Negative.   Neurological: Negative.   Endo/Heme/Allergies: Negative.  Psychiatric/Behavioral: Negative.       Physical Exam:  weight is 212 lb (96.2 kg).   Wt Readings from Last 3 Encounters:  03/09/18 212 lb (96.2 kg)  02/16/18 220 lb (99.8 kg)  02/01/18 218 lb (98.9 kg)    Physical Exam  Constitutional: She is oriented to person, place, and time.  HENT:  Head: Normocephalic and atraumatic.  Mouth/Throat: Oropharynx is clear and moist.  Eyes: Pupils are equal, round, and reactive to light. EOM are normal.  Neck: Normal range of motion.  Cardiovascular: Normal rate, regular rhythm and normal heart sounds.  Pulmonary/Chest: Effort normal and breath sounds normal.  Abdominal: Soft. Bowel  sounds are normal.  Musculoskeletal: Normal range of motion. She exhibits no edema, tenderness or deformity.  Lymphadenopathy:    She has no cervical adenopathy.  Neurological: She is alert and oriented to person, place, and time.  Skin: Skin is warm and dry. No rash noted. No erythema.  Psychiatric: She has a normal mood and affect. Her behavior is normal. Judgment and thought content normal.  Vitals reviewed.    Lab Results  Component Value Date   WBC 18.7 (H) 03/09/2018   HGB 14.3 02/12/2018   HCT 37.4 03/09/2018   MCV 94.4 03/09/2018   PLT 314 03/09/2018   Lab Results  Component Value Date   FERRITIN 338 (H) 07/31/2011   IRON 36 (L) 03/21/2009   TIBC 232 (L) 03/21/2009   UIBC 196 03/21/2009   IRONPCTSAT 16 (L) 03/21/2009   Lab Results  Component Value Date   RETICCTPCT 1.3 03/27/2011   RBC 3.96 03/09/2018   RETICCTABS 56.4 03/27/2011   Lab Results  Component Value Date   KPAFRELGTCHN 23.1 (H) 02/16/2018   LAMBDASER 15.8 02/16/2018   KAPLAMBRATIO 1.46 02/16/2018   Lab Results  Component Value Date   IGGSERUM 924 02/16/2018   IGA 222 02/16/2018   IGMSERUM 126 02/16/2018   Lab Results  Component Value Date   TOTALPROTELP 6.9 02/16/2018   ALBUMINELP 3.6 02/16/2018   A1GS 0.2 02/16/2018   A2GS 0.8 02/16/2018   BETS 1.3 02/16/2018   BETA2SER 0.4 07/02/2015   GAMS 1.0 02/16/2018   MSPIKE Not Observed 02/16/2018   SPEI * 07/02/2015     Chemistry      Component Value Date/Time   NA 141 03/09/2018 0820   NA 141 07/06/2017 0816   NA 141 01/05/2017 0956   K 3.7 03/09/2018 0820   K 3.6 07/06/2017 0816   K 4.3 01/05/2017 0956   CL 107 03/09/2018 0820   CL 101 07/06/2017 0816   CO2 25 03/09/2018 0820   CO2 28 07/06/2017 0816   CO2 23 01/05/2017 0956   BUN 22 03/09/2018 0820   BUN 28 (H) 07/06/2017 0816   BUN 22.4 01/05/2017 0956   CREATININE 1.60 (H) 03/09/2018 0820   CREATININE 1.6 (H) 07/06/2017 0816   CREATININE 1.4 (H) 01/05/2017 0956        Component Value Date/Time   CALCIUM 9.1 03/09/2018 0820   CALCIUM 9.1 07/06/2017 0816   CALCIUM 9.3 01/05/2017 0956   ALKPHOS 72 03/09/2018 0820   ALKPHOS 58 07/06/2017 0816   ALKPHOS 52 01/05/2017 0956   AST 20 03/09/2018 0820   AST 14 01/05/2017 0956   ALT 28 03/09/2018 0820   ALT 58 (H) 07/06/2017 0816   ALT 13 01/05/2017 0956   BILITOT 0.5 03/09/2018 0820   BILITOT 0.56 01/05/2017 0956      Impression and Plan: Ms. Vincelette is  a very pleasant 67 yo caucasian female with past history of kappa light chain myeloma with stem cell transplant. She has now been diagnosed with locally advanced infiltrating ductal carcinoma of the right breast, ER(-)/PR(-)/HER-2(+), Ki67 of 75%.   We will go ahead with her second cycle of treatment.  I will plan to give her 4 cycles of neoadjuvant chemotherapy and then we will proceed with her lumpectomy.  I think she is already responding to treatment.  We will plan to get her back in 3 more weeks for her third cycle of treatment.    [name], MD 4/16/20199:28 AM

## 2018-03-09 NOTE — Patient Instructions (Signed)
Aprepitant capsules What is this medicine? APREPITANT (ap RE pi tant) is used with other medicines to prevent nausea and vomiting caused by cancer treatment (chemotherapy). It is also used alone to prevent nausea and vomiting caused by anesthesia used during surgery. This medicine may be used for other purposes; ask your health care provider or pharmacist if you have questions. COMMON BRAND NAME(S): Emend What should I tell my health care provider before I take this medicine? They need to know if you have any of these conditions: -liver disease -an unusual or allergic reaction to aprepitant, fosaprepitant, other medicines, foods, dyes, or preservatives -pregnant or trying to get pregnant -breast-feeding How should I use this medicine? Take this medicine by mouth with a glass of water. Follow the directions on the prescription label. Usually, you will take your first dose one hour before your chemotherapy begins, and then once daily in the morning for the next 2 days after your chemotherapy treatment. This medicine may be taken with or without food. Do not take more often than directed. Talk to your pediatrician regarding the use of this medicine in children. While this drug may be prescribed for children as young as 12 years for selected conditions, precautions do apply. Overdosage: If you think you have taken too much of this medicine contact a poison control center or emergency room at once. NOTE: This medicine is only for you. Do not share this medicine with others. What if I miss a dose? If you miss a dose, take it as soon as you can. If it is almost time for your next dose, take only that dose. Do not take double or extra doses. What may interact with this medicine? Do not take this medicine with any of these medicines: -cisapride -flibanserin -lomitapide -pimozide This medicine may also interact with the following medications: -diltiazem -female hormones, like estrogens or progestins  and birth control pills -medicines for fungal infections like ketoconazole and itraconazole -medicines for HIV -medicines for seizures or to control epilepsy like carbamazepine or phenytoin -medicines used for sleep or anxiety disorders like alprazolam, diazepam, or midazolam -nefazodone -paroxetine -ranolazine -rifampin -some chemotherapy medications like etoposide, ifosfamide, vinblastine, vincristine -some antibiotics like clarithromycin, erythromycin, troleandomycin -steroid medicines like dexamethasone or methylprednisolone -tolbutamide -warfarin This list may not describe all possible interactions. Give your health care provider a list of all the medicines, herbs, non-prescription drugs, or dietary supplements you use. Also tell them if you smoke, drink alcohol, or use illegal drugs. Some items may interact with your medicine. What should I watch for while using this medicine? Do not take this medicine if you already have nausea and vomiting. Ask your health care provider what to do if you already have nausea. Birth control pills and other methods of hormonal contraception (for example, IUD or patch) may not work properly while you are taking this medicine. Use an extra method of birth control during treatment and for 1 month after your last dose of aprepitant. This medicine should not be used continuously for a long time. Visit your doctor or health care professional for regular check-ups. This medicine may change your liver function blood test results. What side effects may I notice from receiving this medicine? Side effects that you should report to your doctor or health care professional as soon as possible: -allergic reactions like skin rash, itching or hives, swelling of the face, lips, or tongue -breathing problems -changes in heart rhythm -high or low blood pressure -rectal bleeding -serious dizziness or disorientation, confusion -  sharp or severe stomach pain -sharp pain in  your leg Side effects that usually do not require medical attention (report to your doctor or health care professional if they continue or are bothersome): -constipation or diarrhea -hair loss -headache -hiccups -loss of appetite -nausea -upset stomach -tiredness This list may not describe all possible side effects. Call your doctor for medical advice about side effects. You may report side effects to FDA at 1-800-FDA-1088. Where should I keep my medicine? Keep out of the reach of children. Store at room temperature between 20 and 25 degrees C (68 and 77 degrees F). Throw away any unused medicine after the expiration date. NOTE: This sheet is a summary. It may not cover all possible information. If you have questions about this medicine, talk to your doctor, pharmacist, or health care provider.  2018 Elsevier/Gold Standard (2014-12-27 09:59:34) Palonosetron Injection What is this medicine? PALONOSETRON (pal oh NOE se tron) is used to prevent nausea and vomiting caused by chemotherapy. It also helps prevent delayed nausea and vomiting that may occur a few days after your treatment. This medicine may be used for other purposes; ask your health care provider or pharmacist if you have questions. COMMON BRAND NAME(S): Aloxi What should I tell my health care provider before I take this medicine? They need to know if you have any of these conditions: -an unusual or allergic reaction to palonosetron, dolasetron, granisetron, ondansetron, other medicines, foods, dyes, or preservatives -pregnant or trying to get pregnant -breast-feeding How should I use this medicine? This medicine is for infusion into a vein. It is given by a health care professional in a hospital or clinic setting. Talk to your pediatrician regarding the use of this medicine in children. While this drug may be prescribed for children as young as 1 month for selected conditions, precautions do apply. Overdosage: If you think you  have taken too much of this medicine contact a poison control center or emergency room at once. NOTE: This medicine is only for you. Do not share this medicine with others. What if I miss a dose? This does not apply. What may interact with this medicine? -certain medicines for depression, anxiety, or psychotic disturbances -fentanyl -linezolid -MAOIs like Carbex, Eldepryl, Marplan, Nardil, and Parnate -methylene blue (injected into a vein) -tramadol This list may not describe all possible interactions. Give your health care provider a list of all the medicines, herbs, non-prescription drugs, or dietary supplements you use. Also tell them if you smoke, drink alcohol, or use illegal drugs. Some items may interact with your medicine. What should I watch for while using this medicine? Your condition will be monitored carefully while you are receiving this medicine. What side effects may I notice from receiving this medicine? Side effects that you should report to your doctor or health care professional as soon as possible: -allergic reactions like skin rash, itching or hives, swelling of the face, lips, or tongue -breathing problems -confusion -dizziness -fast, irregular heartbeat -fever and chills -loss of balance or coordination -seizures -sweating -swelling of the hands and feet -tremors -unusually weak or tired Side effects that usually do not require medical attention (report to your doctor or health care professional if they continue or are bothersome): -constipation or diarrhea -headache This list may not describe all possible side effects. Call your doctor for medical advice about side effects. You may report side effects to FDA at 1-800-FDA-1088. Where should I keep my medicine? This drug is given in a hospital or clinic and  will not be stored at home. NOTE: This sheet is a summary. It may not cover all possible information. If you have questions about this medicine, talk to  your doctor, pharmacist, or health care provider.  2018 Elsevier/Gold Standard (2013-09-16 10:38:36) Pertuzumab injection What is this medicine? PERTUZUMAB (per TOOZ ue mab) is a monoclonal antibody. It is used to treat breast cancer. This medicine may be used for other purposes; ask your health care provider or pharmacist if you have questions. COMMON BRAND NAME(S): PERJETA What should I tell my health care provider before I take this medicine? They need to know if you have any of these conditions: -heart disease -heart failure -high blood pressure -history of irregular heart beat -recent or ongoing radiation therapy -an unusual or allergic reaction to pertuzumab, other medicines, foods, dyes, or preservatives -pregnant or trying to get pregnant -breast-feeding How should I use this medicine? This medicine is for infusion into a vein. It is given by a health care professional in a hospital or clinic setting. Talk to your pediatrician regarding the use of this medicine in children. Special care may be needed. Overdosage: If you think you have taken too much of this medicine contact a poison control center or emergency room at once. NOTE: This medicine is only for you. Do not share this medicine with others. What if I miss a dose? It is important not to miss your dose. Call your doctor or health care professional if you are unable to keep an appointment. What may interact with this medicine? Interactions are not expected. Give your health care provider a list of all the medicines, herbs, non-prescription drugs, or dietary supplements you use. Also tell them if you smoke, drink alcohol, or use illegal drugs. Some items may interact with your medicine. This list may not describe all possible interactions. Give your health care provider a list of all the medicines, herbs, non-prescription drugs, or dietary supplements you use. Also tell them if you smoke, drink alcohol, or use illegal drugs.  Some items may interact with your medicine. What should I watch for while using this medicine? Your condition will be monitored carefully while you are receiving this medicine. Report any side effects. Continue your course of treatment even though you feel ill unless your doctor tells you to stop. Do not become pregnant while taking this medicine or for 7 months after stopping it. Women should inform their doctor if they wish to become pregnant or think they might be pregnant. Women of child-bearing potential will need to have a negative pregnancy test before starting this medicine. There is a potential for serious side effects to an unborn child. Talk to your health care professional or pharmacist for more information. Do not breast-feed an infant while taking this medicine or for 7 months after stopping it. Women must use effective birth control with this medicine. Call your doctor or health care professional for advice if you get a fever, chills or sore throat, or other symptoms of a cold or flu. Do not treat yourself. Try to avoid being around people who are sick. You may experience fever, chills, and headache during the infusion. Report any side effects during the infusion to your health care professional. What side effects may I notice from receiving this medicine? Side effects that you should report to your doctor or health care professional as soon as possible: -breathing problems -chest pain or palpitations -dizziness -feeling faint or lightheaded -fever or chills -skin rash, itching or hives -sore throat -  swelling of the face, lips, or tongue -swelling of the legs or ankles -unusually weak or tired Side effects that usually do not require medical attention (report to your doctor or health care professional if they continue or are bothersome): -diarrhea -hair loss -nausea, vomiting -tiredness This list may not describe all possible side effects. Call your doctor for medical advice  about side effects. You may report side effects to FDA at 1-800-FDA-1088. Where should I keep my medicine? This drug is given in a hospital or clinic and will not be stored at home. NOTE: This sheet is a summary. It may not cover all possible information. If you have questions about this medicine, talk to your doctor, pharmacist, or health care provider.  2018 Elsevier/Gold Standard (2015-12-13 12:08:50) Trastuzumab injection for infusion What is this medicine? TRASTUZUMAB (tras TOO zoo mab) is a monoclonal antibody. It is used to treat breast cancer and stomach cancer. This medicine may be used for other purposes; ask your health care provider or pharmacist if you have questions. COMMON BRAND NAME(S): Herceptin What should I tell my health care provider before I take this medicine? They need to know if you have any of these conditions: -heart disease -heart failure -lung or breathing disease, like asthma -an unusual or allergic reaction to trastuzumab, benzyl alcohol, or other medications, foods, dyes, or preservatives -pregnant or trying to get pregnant -breast-feeding How should I use this medicine? This drug is given as an infusion into a vein. It is administered in a hospital or clinic by a specially trained health care professional. Talk to your pediatrician regarding the use of this medicine in children. This medicine is not approved for use in children. Overdosage: If you think you have taken too much of this medicine contact a poison control center or emergency room at once. NOTE: This medicine is only for you. Do not share this medicine with others. What if I miss a dose? It is important not to miss a dose. Call your doctor or health care professional if you are unable to keep an appointment. What may interact with this medicine? This medicine may interact with the following medications: -certain types of chemotherapy, such as daunorubicin, doxorubicin, epirubicin, and  idarubicin This list may not describe all possible interactions. Give your health care provider a list of all the medicines, herbs, non-prescription drugs, or dietary supplements you use. Also tell them if you smoke, drink alcohol, or use illegal drugs. Some items may interact with your medicine. What should I watch for while using this medicine? Visit your doctor for checks on your progress. Report any side effects. Continue your course of treatment even though you feel ill unless your doctor tells you to stop. Call your doctor or health care professional for advice if you get a fever, chills or sore throat, or other symptoms of a cold or flu. Do not treat yourself. Try to avoid being around people who are sick. You may experience fever, chills and shaking during your first infusion. These effects are usually mild and can be treated with other medicines. Report any side effects during the infusion to your health care professional. Fever and chills usually do not happen with later infusions. Do not become pregnant while taking this medicine or for 7 months after stopping it. Women should inform their doctor if they wish to become pregnant or think they might be pregnant. Women of child-bearing potential will need to have a negative pregnancy test before starting this medicine. There is a  potential for serious side effects to an unborn child. Talk to your health care professional or pharmacist for more information. Do not breast-feed an infant while taking this medicine or for 7 months after stopping it. Women must use effective birth control with this medicine. What side effects may I notice from receiving this medicine? Side effects that you should report to your doctor or health care professional as soon as possible: -allergic reactions like skin rash, itching or hives, swelling of the face, lips, or tongue -chest pain or palpitations -cough -dizziness -feeling faint or lightheaded,  falls -fever -general ill feeling or flu-like symptoms -signs of worsening heart failure like breathing problems; swelling in your legs and feet -unusually weak or tired Side effects that usually do not require medical attention (report to your doctor or health care professional if they continue or are bothersome): -bone pain -changes in taste -diarrhea -joint pain -nausea/vomiting -weight loss This list may not describe all possible side effects. Call your doctor for medical advice about side effects. You may report side effects to FDA at 1-800-FDA-1088. Where should I keep my medicine? This drug is given in a hospital or clinic and will not be stored at home. NOTE: This sheet is a summary. It may not cover all possible information. If you have questions about this medicine, talk to your doctor, pharmacist, or health care provider.  2018 Elsevier/Gold Standard (2016-11-04 14:37:52) Carboplatin injection What is this medicine? CARBOPLATIN (KAR boe pla tin) is a chemotherapy drug. It targets fast dividing cells, like cancer cells, and causes these cells to die. This medicine is used to treat ovarian cancer and many other cancers. This medicine may be used for other purposes; ask your health care provider or pharmacist if you have questions. COMMON BRAND NAME(S): Paraplatin What should I tell my health care provider before I take this medicine? They need to know if you have any of these conditions: -blood disorders -hearing problems -kidney disease -recent or ongoing radiation therapy -an unusual or allergic reaction to carboplatin, cisplatin, other chemotherapy, other medicines, foods, dyes, or preservatives -pregnant or trying to get pregnant -breast-feeding How should I use this medicine? This drug is usually given as an infusion into a vein. It is administered in a hospital or clinic by a specially trained health care professional. Talk to your pediatrician regarding the use of  this medicine in children. Special care may be needed. Overdosage: If you think you have taken too much of this medicine contact a poison control center or emergency room at once. NOTE: This medicine is only for you. Do not share this medicine with others. What if I miss a dose? It is important not to miss a dose. Call your doctor or health care professional if you are unable to keep an appointment. What may interact with this medicine? -medicines for seizures -medicines to increase blood counts like filgrastim, pegfilgrastim, sargramostim -some antibiotics like amikacin, gentamicin, neomycin, streptomycin, tobramycin -vaccines Talk to your doctor or health care professional before taking any of these medicines: -acetaminophen -aspirin -ibuprofen -ketoprofen -naproxen This list may not describe all possible interactions. Give your health care provider a list of all the medicines, herbs, non-prescription drugs, or dietary supplements you use. Also tell them if you smoke, drink alcohol, or use illegal drugs. Some items may interact with your medicine. What should I watch for while using this medicine? Your condition will be monitored carefully while you are receiving this medicine. You will need important blood work done while  you are taking this medicine. This drug may make you feel generally unwell. This is not uncommon, as chemotherapy can affect healthy cells as well as cancer cells. Report any side effects. Continue your course of treatment even though you feel ill unless your doctor tells you to stop. In some cases, you may be given additional medicines to help with side effects. Follow all directions for their use. Call your doctor or health care professional for advice if you get a fever, chills or sore throat, or other symptoms of a cold or flu. Do not treat yourself. This drug decreases your body's ability to fight infections. Try to avoid being around people who are sick. This medicine  may increase your risk to bruise or bleed. Call your doctor or health care professional if you notice any unusual bleeding. Be careful brushing and flossing your teeth or using a toothpick because you may get an infection or bleed more easily. If you have any dental work done, tell your dentist you are receiving this medicine. Avoid taking products that contain aspirin, acetaminophen, ibuprofen, naproxen, or ketoprofen unless instructed by your doctor. These medicines may hide a fever. Do not become pregnant while taking this medicine. Women should inform their doctor if they wish to become pregnant or think they might be pregnant. There is a potential for serious side effects to an unborn child. Talk to your health care professional or pharmacist for more information. Do not breast-feed an infant while taking this medicine. What side effects may I notice from receiving this medicine? Side effects that you should report to your doctor or health care professional as soon as possible: -allergic reactions like skin rash, itching or hives, swelling of the face, lips, or tongue -signs of infection - fever or chills, cough, sore throat, pain or difficulty passing urine -signs of decreased platelets or bleeding - bruising, pinpoint red spots on the skin, black, tarry stools, nosebleeds -signs of decreased red blood cells - unusually weak or tired, fainting spells, lightheadedness -breathing problems -changes in hearing -changes in vision -chest pain -high blood pressure -low blood counts - This drug may decrease the number of white blood cells, red blood cells and platelets. You may be at increased risk for infections and bleeding. -nausea and vomiting -pain, swelling, redness or irritation at the injection site -pain, tingling, numbness in the hands or feet -problems with balance, talking, walking -trouble passing urine or change in the amount of urine Side effects that usually do not require medical  attention (report to your doctor or health care professional if they continue or are bothersome): -hair loss -loss of appetite -metallic taste in the mouth or changes in taste This list may not describe all possible side effects. Call your doctor for medical advice about side effects. You may report side effects to FDA at 1-800-FDA-1088. Where should I keep my medicine? This drug is given in a hospital or clinic and will not be stored at home. NOTE: This sheet is a summary. It may not cover all possible information. If you have questions about this medicine, talk to your doctor, pharmacist, or health care provider.  2018 Elsevier/Gold Standard (2008-02-15 14:38:05) Docetaxel injection What is this medicine? DOCETAXEL (doe se TAX el) is a chemotherapy drug. It targets fast dividing cells, like cancer cells, and causes these cells to die. This medicine is used to treat many types of cancers like breast cancer, certain stomach cancers, head and neck cancer, lung cancer, and prostate cancer. This medicine  may be used for other purposes; ask your health care provider or pharmacist if you have questions. COMMON BRAND NAME(S): Docefrez, Taxotere What should I tell my health care provider before I take this medicine? They need to know if you have any of these conditions: -infection (especially a virus infection such as chickenpox, cold sores, or herpes) -liver disease -low blood counts, like low white cell, platelet, or red cell counts -an unusual or allergic reaction to docetaxel, polysorbate 80, other chemotherapy agents, other medicines, foods, dyes, or preservatives -pregnant or trying to get pregnant -breast-feeding How should I use this medicine? This drug is given as an infusion into a vein. It is administered in a hospital or clinic by a specially trained health care professional. Talk to your pediatrician regarding the use of this medicine in children. Special care may be  needed. Overdosage: If you think you have taken too much of this medicine contact a poison control center or emergency room at once. NOTE: This medicine is only for you. Do not share this medicine with others. What if I miss a dose? It is important not to miss your dose. Call your doctor or health care professional if you are unable to keep an appointment. What may interact with this medicine? -cyclosporine -erythromycin -ketoconazole -medicines to increase blood counts like filgrastim, pegfilgrastim, sargramostim -vaccines Talk to your doctor or health care professional before taking any of these medicines: -acetaminophen -aspirin -ibuprofen -ketoprofen -naproxen This list may not describe all possible interactions. Give your health care provider a list of all the medicines, herbs, non-prescription drugs, or dietary supplements you use. Also tell them if you smoke, drink alcohol, or use illegal drugs. Some items may interact with your medicine. What should I watch for while using this medicine? Your condition will be monitored carefully while you are receiving this medicine. You will need important blood work done while you are taking this medicine. This drug may make you feel generally unwell. This is not uncommon, as chemotherapy can affect healthy cells as well as cancer cells. Report any side effects. Continue your course of treatment even though you feel ill unless your doctor tells you to stop. In some cases, you may be given additional medicines to help with side effects. Follow all directions for their use. Call your doctor or health care professional for advice if you get a fever, chills or sore throat, or other symptoms of a cold or flu. Do not treat yourself. This drug decreases your body's ability to fight infections. Try to avoid being around people who are sick. This medicine may increase your risk to bruise or bleed. Call your doctor or health care professional if you notice  any unusual bleeding. This medicine may contain alcohol in the product. You may get drowsy or dizzy. Do not drive, use machinery, or do anything that needs mental alertness until you know how this medicine affects you. Do not stand or sit up quickly, especially if you are an older patient. This reduces the risk of dizzy or fainting spells. Avoid alcoholic drinks. Do not become pregnant while taking this medicine. Women should inform their doctor if they wish to become pregnant or think they might be pregnant. There is a potential for serious side effects to an unborn child. Talk to your health care professional or pharmacist for more information. Do not breast-feed an infant while taking this medicine. What side effects may I notice from receiving this medicine? Side effects that you should report to your  doctor or health care professional as soon as possible: -allergic reactions like skin rash, itching or hives, swelling of the face, lips, or tongue -low blood counts - This drug may decrease the number of white blood cells, red blood cells and platelets. You may be at increased risk for infections and bleeding. -signs of infection - fever or chills, cough, sore throat, pain or difficulty passing urine -signs of decreased platelets or bleeding - bruising, pinpoint red spots on the skin, black, tarry stools, nosebleeds -signs of decreased red blood cells - unusually weak or tired, fainting spells, lightheadedness -breathing problems -fast or irregular heartbeat -low blood pressure -mouth sores -nausea and vomiting -pain, swelling, redness or irritation at the injection site -pain, tingling, numbness in the hands or feet -swelling of the ankle, feet, hands -weight gain Side effects that usually do not require medical attention (report to your doctor or health care professional if they continue or are bothersome): -bone pain -complete hair loss including hair on your head, underarms, pubic hair,  eyebrows, and eyelashes -diarrhea -excessive tearing -changes in the color of fingernails -loosening of the fingernails -nausea -muscle pain -red flush to skin -sweating -weak or tired This list may not describe all possible side effects. Call your doctor for medical advice about side effects. You may report side effects to FDA at 1-800-FDA-1088. Where should I keep my medicine? This drug is given in a hospital or clinic and will not be stored at home. NOTE: This sheet is a summary. It may not cover all possible information. If you have questions about this medicine, talk to your doctor, pharmacist, or health care provider.  2018 Elsevier/Gold Standard (2015-12-13 12:32:56)

## 2018-03-11 ENCOUNTER — Telehealth: Payer: Self-pay | Admitting: Hematology & Oncology

## 2018-03-11 ENCOUNTER — Inpatient Hospital Stay: Payer: 59

## 2018-03-11 VITALS — BP 117/66 | HR 89 | Temp 97.9°F | Resp 18

## 2018-03-11 DIAGNOSIS — C50411 Malignant neoplasm of upper-outer quadrant of right female breast: Secondary | ICD-10-CM | POA: Diagnosis not present

## 2018-03-11 DIAGNOSIS — C50911 Malignant neoplasm of unspecified site of right female breast: Secondary | ICD-10-CM

## 2018-03-11 MED ORDER — PEGFILGRASTIM-CBQV 6 MG/0.6ML ~~LOC~~ SOSY
6.0000 mg | PREFILLED_SYRINGE | Freq: Once | SUBCUTANEOUS | Status: AC
  Administered 2018-03-11: 6 mg via SUBCUTANEOUS
  Filled 2018-03-11: qty 0.6

## 2018-03-11 NOTE — Telephone Encounter (Signed)
Faxed medical records to: [facility], RN, BSN CANCER CASE [place] P: [phone] [id number] F: [phone]       COPY SCANNED

## 2018-03-11 NOTE — Patient Instructions (Signed)
Pegfilgrastim injection What is this medicine? PEGFILGRASTIM (PEG fil gra stim) is a long-acting granulocyte colony-stimulating factor that stimulates the growth of neutrophils, a type of white blood cell important in the body's fight against infection. It is used to reduce the incidence of fever and infection in patients with certain types of cancer who are receiving chemotherapy that affects the bone marrow, and to increase survival after being exposed to high doses of radiation. This medicine may be used for other purposes; ask your health care provider or pharmacist if you have questions. COMMON BRAND NAME(S): Neulasta What should I tell my health care provider before I take this medicine? They need to know if you have any of these conditions: -kidney disease -latex allergy -ongoing radiation therapy -sickle cell disease -skin reactions to acrylic adhesives (On-Body Injector only) -an unusual or allergic reaction to pegfilgrastim, filgrastim, other medicines, foods, dyes, or preservatives -pregnant or trying to get pregnant -breast-feeding How should I use this medicine? This medicine is for injection under the skin. If you get this medicine at home, you will be taught how to prepare and give the pre-filled syringe or how to use the On-body Injector. Refer to the patient Instructions for Use for detailed instructions. Use exactly as directed. Tell your healthcare provider immediately if you suspect that the On-body Injector may not have performed as intended or if you suspect the use of the On-body Injector resulted in a missed or partial dose. It is important that you put your used needles and syringes in a special sharps container. Do not put them in a trash can. If you do not have a sharps container, call your pharmacist or healthcare provider to get one. Talk to your pediatrician regarding the use of this medicine in children. While this drug may be prescribed for selected conditions,  precautions do apply. Overdosage: If you think you have taken too much of this medicine contact a poison control center or emergency room at once. NOTE: This medicine is only for you. Do not share this medicine with others. What if I miss a dose? It is important not to miss your dose. Call your doctor or health care professional if you miss your dose. If you miss a dose due to an On-body Injector failure or leakage, a new dose should be administered as soon as possible using a single prefilled syringe for manual use. What may interact with this medicine? Interactions have not been studied. Give your health care provider a list of all the medicines, herbs, non-prescription drugs, or dietary supplements you use. Also tell them if you smoke, drink alcohol, or use illegal drugs. Some items may interact with your medicine. This list may not describe all possible interactions. Give your health care provider a list of all the medicines, herbs, non-prescription drugs, or dietary supplements you use. Also tell them if you smoke, drink alcohol, or use illegal drugs. Some items may interact with your medicine. What should I watch for while using this medicine? You may need blood work done while you are taking this medicine. If you are going to need a MRI, CT scan, or other procedure, tell your doctor that you are using this medicine (On-Body Injector only). What side effects may I notice from receiving this medicine? Side effects that you should report to your doctor or health care professional as soon as possible: -allergic reactions like skin rash, itching or hives, swelling of the face, lips, or tongue -dizziness -fever -pain, redness, or irritation at site   where injected -pinpoint red spots on the skin -red or dark-brown urine -shortness of breath or breathing problems -stomach or side pain, or pain at the shoulder -swelling -tiredness -trouble passing urine or change in the amount of urine Side  effects that usually do not require medical attention (report to your doctor or health care professional if they continue or are bothersome): -bone pain -muscle pain This list may not describe all possible side effects. Call your doctor for medical advice about side effects. You may report side effects to FDA at 1-800-FDA-1088. Where should I keep my medicine? Keep out of the reach of children. Store pre-filled syringes in a refrigerator between 2 and 8 degrees C (36 and 46 degrees F). Do not freeze. Keep in carton to protect from light. Throw away this medicine if it is left out of the refrigerator for more than 48 hours. Throw away any unused medicine after the expiration date. NOTE: This sheet is a summary. It may not cover all possible information. If you have questions about this medicine, talk to your doctor, pharmacist, or health care provider.  2018 Elsevier/Gold Standard (2016-11-06 12:58:03)  

## 2018-03-18 ENCOUNTER — Telehealth: Payer: Self-pay | Admitting: *Deleted

## 2018-03-18 DIAGNOSIS — N390 Urinary tract infection, site not specified: Secondary | ICD-10-CM

## 2018-03-18 MED ORDER — CIPROFLOXACIN HCL 500 MG PO TABS: 500.0000 mg | ORAL_TABLET | Freq: Two times a day (BID) | ORAL | 0 refills | Status: DC

## 2018-03-18 NOTE — Telephone Encounter (Signed)
Patient c/o UTI. She was recently diagnosed with a UTI, and now the symptoms have returned.   Reviewed with Dr [name]. He will send in an antibiotic for patient. Patient is aware of new prescription. Pharmacy confirmed.

## 2018-03-23 ENCOUNTER — Other Ambulatory Visit: Payer: Self-pay | Admitting: Family

## 2018-03-23 ENCOUNTER — Other Ambulatory Visit: Payer: Self-pay | Admitting: Hematology & Oncology

## 2018-03-23 DIAGNOSIS — C50911 Malignant neoplasm of unspecified site of right female breast: Secondary | ICD-10-CM

## 2018-03-24 ENCOUNTER — Other Ambulatory Visit: Payer: Self-pay | Admitting: Hematology & Oncology

## 2018-03-24 ENCOUNTER — Telehealth: Payer: Self-pay | Admitting: *Deleted

## 2018-03-24 DIAGNOSIS — N390 Urinary tract infection, site not specified: Secondary | ICD-10-CM

## 2018-03-24 NOTE — Telephone Encounter (Signed)
Received a request for a refill on a Cipro prescription that was sent 6 days ago. Called the patient to inquire why the request was being made.   She stated that after 24hour of being off the Cipro, her UTI symptoms have returned. This last antibiotic cycle was the second round of antibiotics to treat UTI symptoms.  Explained to the patient that at this time, she needed to get a work up completed by her primary care physician. That a urine specimen was likely needed and further workup to determine why the last two rounds of antibiotics did not clear the infection. Explained that continued use of antibiotics that aren't effective against the infection she has, will only put her at risk for more severe infections. She understood and will contact her PCP.

## 2018-03-30 ENCOUNTER — Inpatient Hospital Stay (HOSPITAL_BASED_OUTPATIENT_CLINIC_OR_DEPARTMENT_OTHER): Payer: 59 | Admitting: Hematology & Oncology

## 2018-03-30 ENCOUNTER — Other Ambulatory Visit: Payer: Self-pay

## 2018-03-30 ENCOUNTER — Inpatient Hospital Stay: Payer: 59 | Attending: Hematology & Oncology

## 2018-03-30 ENCOUNTER — Inpatient Hospital Stay (HOSPITAL_COMMUNITY): Payer: 59

## 2018-03-30 ENCOUNTER — Inpatient Hospital Stay: Payer: 59

## 2018-03-30 ENCOUNTER — Inpatient Hospital Stay (HOSPITAL_COMMUNITY)
Admission: EM | Admit: 2018-03-30 | Discharge: 2018-04-02 | DRG: 683 | Disposition: A | Discharge status: Home / Self Care | Payer: 59 | Attending: Internal Medicine | Admitting: Internal Medicine

## 2018-03-30 ENCOUNTER — Telehealth: Payer: Self-pay | Admitting: *Deleted

## 2018-03-30 ENCOUNTER — Encounter (HOSPITAL_COMMUNITY): Payer: Self-pay | Admitting: Emergency Medicine

## 2018-03-30 VITALS — BP 102/60 | HR 73 | Temp 97.8°F | Resp 22 | Wt 205.5 lb

## 2018-03-30 DIAGNOSIS — E872 Acidosis: Secondary | ICD-10-CM | POA: Diagnosis present

## 2018-03-30 DIAGNOSIS — Z888 Allergy status to other drugs, medicaments and biological substances status: Secondary | ICD-10-CM

## 2018-03-30 DIAGNOSIS — D649 Anemia, unspecified: Secondary | ICD-10-CM | POA: Diagnosis present

## 2018-03-30 DIAGNOSIS — Z9221 Personal history of antineoplastic chemotherapy: Secondary | ICD-10-CM | POA: Diagnosis not present

## 2018-03-30 DIAGNOSIS — M109 Gout, unspecified: Secondary | ICD-10-CM | POA: Diagnosis present

## 2018-03-30 DIAGNOSIS — Z825 Family history of asthma and other chronic lower respiratory diseases: Secondary | ICD-10-CM | POA: Diagnosis not present

## 2018-03-30 DIAGNOSIS — N189 Chronic kidney disease, unspecified: Secondary | ICD-10-CM | POA: Diagnosis not present

## 2018-03-30 DIAGNOSIS — C50411 Malignant neoplasm of upper-outer quadrant of right female breast: Secondary | ICD-10-CM | POA: Diagnosis present

## 2018-03-30 DIAGNOSIS — Z79899 Other long term (current) drug therapy: Secondary | ICD-10-CM

## 2018-03-30 DIAGNOSIS — N390 Urinary tract infection, site not specified: Secondary | ICD-10-CM | POA: Diagnosis present

## 2018-03-30 DIAGNOSIS — C50911 Malignant neoplasm of unspecified site of right female breast: Secondary | ICD-10-CM | POA: Diagnosis present

## 2018-03-30 DIAGNOSIS — Z171 Estrogen receptor negative status [ER-]: Secondary | ICD-10-CM

## 2018-03-30 DIAGNOSIS — Z1501 Genetic susceptibility to malignant neoplasm of breast: Secondary | ICD-10-CM | POA: Diagnosis not present

## 2018-03-30 DIAGNOSIS — Z9484 Stem cells transplant status: Secondary | ICD-10-CM

## 2018-03-30 DIAGNOSIS — N179 Acute kidney failure, unspecified: Principal | ICD-10-CM

## 2018-03-30 DIAGNOSIS — Z7689 Persons encountering health services in other specified circumstances: Secondary | ICD-10-CM | POA: Diagnosis not present

## 2018-03-30 DIAGNOSIS — Z792 Long term (current) use of antibiotics: Secondary | ICD-10-CM | POA: Diagnosis not present

## 2018-03-30 DIAGNOSIS — N19 Unspecified kidney failure: Secondary | ICD-10-CM | POA: Diagnosis not present

## 2018-03-30 DIAGNOSIS — T368X5A Adverse effect of other systemic antibiotics, initial encounter: Secondary | ICD-10-CM | POA: Diagnosis present

## 2018-03-30 DIAGNOSIS — Z90710 Acquired absence of both cervix and uterus: Secondary | ICD-10-CM

## 2018-03-30 DIAGNOSIS — I10 Essential (primary) hypertension: Secondary | ICD-10-CM

## 2018-03-30 DIAGNOSIS — Z5111 Encounter for antineoplastic chemotherapy: Secondary | ICD-10-CM | POA: Diagnosis not present

## 2018-03-30 DIAGNOSIS — Z1629 Resistance to other single specified antibiotic: Secondary | ICD-10-CM | POA: Diagnosis present

## 2018-03-30 DIAGNOSIS — Z881 Allergy status to other antibiotic agents status: Secondary | ICD-10-CM

## 2018-03-30 DIAGNOSIS — C50919 Malignant neoplasm of unspecified site of unspecified female breast: Secondary | ICD-10-CM | POA: Diagnosis not present

## 2018-03-30 DIAGNOSIS — Z5112 Encounter for antineoplastic immunotherapy: Secondary | ICD-10-CM | POA: Diagnosis not present

## 2018-03-30 DIAGNOSIS — C9001 Multiple myeloma in remission: Secondary | ICD-10-CM

## 2018-03-30 DIAGNOSIS — Z853 Personal history of malignant neoplasm of breast: Secondary | ICD-10-CM

## 2018-03-30 DIAGNOSIS — E876 Hypokalemia: Secondary | ICD-10-CM | POA: Diagnosis present

## 2018-03-30 DIAGNOSIS — B9620 Unspecified Escherichia coli [E. coli] as the cause of diseases classified elsewhere: Secondary | ICD-10-CM | POA: Diagnosis present

## 2018-03-30 DIAGNOSIS — Z6841 Body Mass Index (BMI) 40.0 and over, adult: Secondary | ICD-10-CM

## 2018-03-30 LAB — CBC WITH DIFFERENTIAL (CANCER CENTER ONLY)
BASOS PCT: 0 %
Basophils Absolute: 0 10*3/uL (ref 0.0–0.1)
EOS ABS: 0 10*3/uL (ref 0.0–0.5)
Eosinophils Relative: 0 %
HCT: 31.1 % — ABNORMAL LOW (ref 34.8–46.6)
HEMOGLOBIN: 10.8 g/dL — AB (ref 11.6–15.9)
Lymphocytes Relative: 13 %
Lymphs Abs: 3.4 10*3/uL — ABNORMAL HIGH (ref 0.9–3.3)
MCH: 32.7 pg (ref 26.0–34.0)
MCHC: 34.7 g/dL (ref 32.0–36.0)
MCV: 94.2 fL (ref 81.0–101.0)
MONOS PCT: 1 %
Monocytes Absolute: 0.3 10*3/uL (ref 0.1–0.9)
NEUTROS PCT: 86 %
Neutro Abs: 22.4 10*3/uL — ABNORMAL HIGH (ref 1.5–6.5)
Platelet Count: 228 10*3/uL (ref 145–400)
RBC: 3.3 MIL/uL — ABNORMAL LOW (ref 3.70–5.32)
RDW: 15.7 % (ref 11.1–15.7)
WBC Count: 26.1 10*3/uL — ABNORMAL HIGH (ref 3.9–10.0)

## 2018-03-30 LAB — CMP (CANCER CENTER ONLY)
ALBUMIN: 3.3 g/dL — AB (ref 3.5–5.0)
ALT: 37 U/L (ref 10–47)
AST: 20 U/L (ref 11–38)
Alkaline Phosphatase: 87 U/L — ABNORMAL HIGH (ref 26–84)
Anion gap: 8 (ref 5–15)
BUN: 54 mg/dL — AB (ref 7–22)
CALCIUM: 8.5 mg/dL (ref 8.0–10.3)
CHLORIDE: 110 mmol/L — AB (ref 98–108)
CO2: 17 mmol/L — AB (ref 18–33)
CREATININE: 7.7 mg/dL — AB (ref 0.60–1.20)
GLUCOSE: 147 mg/dL — AB (ref 73–118)
Potassium: 3.4 mmol/L (ref 3.3–4.7)
SODIUM: 135 mmol/L (ref 128–145)
TOTAL PROTEIN: 6.4 g/dL (ref 6.4–8.1)
Total Bilirubin: 0.5 mg/dL (ref 0.2–1.6)

## 2018-03-30 LAB — URINALYSIS, ROUTINE W REFLEX MICROSCOPIC
Bilirubin Urine: NEGATIVE
Glucose, UA: NEGATIVE mg/dL
Hgb urine dipstick: NEGATIVE
Ketones, ur: NEGATIVE mg/dL
Nitrite: POSITIVE — AB
Protein, ur: NEGATIVE mg/dL
Specific Gravity, Urine: 1.008 (ref 1.005–1.030)
pH: 5 (ref 5.0–8.0)

## 2018-03-30 MED ORDER — SENNOSIDES-DOCUSATE SODIUM 8.6-50 MG PO TABS: 1.0000 | ORAL_TABLET | Freq: Every evening | ORAL | Status: DC | PRN

## 2018-03-30 MED ORDER — PROCHLORPERAZINE MALEATE 10 MG PO TABS
10.0000 mg | ORAL_TABLET | Freq: Four times a day (QID) | ORAL | Status: DC | PRN
  Filled 2018-03-30: qty 1

## 2018-03-30 MED ORDER — DOXYLAMINE SUCCINATE (SLEEP) 25 MG PO TABS: 25.0000 mg | ORAL_TABLET | Freq: Every evening | ORAL | Status: DC | PRN

## 2018-03-30 MED ORDER — SODIUM CHLORIDE 0.9% FLUSH
10.0000 mL | INTRAVENOUS | Status: DC | PRN
  Administered 2018-03-30 – 2018-04-02 (×5): 10 mL
  Filled 2018-03-30 (×5): qty 40

## 2018-03-30 MED ORDER — LIDOCAINE-PRILOCAINE 2.5-2.5 % EX CREA: TOPICAL_CREAM | CUTANEOUS | Status: DC | PRN

## 2018-03-30 MED ORDER — HEPARIN SODIUM (PORCINE) 5000 UNIT/ML IJ SOLN
5000.0000 [IU] | Freq: Three times a day (TID) | INTRAMUSCULAR | Status: DC
  Administered 2018-03-30 – 2018-04-02 (×8): 5000 [IU] via SUBCUTANEOUS
  Filled 2018-03-30 (×8): qty 1

## 2018-03-30 MED ORDER — ACETAMINOPHEN 500 MG PO TABS: 500.0000 mg | ORAL_TABLET | Freq: Four times a day (QID) | ORAL | Status: DC | PRN

## 2018-03-30 MED ORDER — SODIUM CHLORIDE 0.9 % IV BOLUS
1000.0000 mL | Freq: Once | INTRAVENOUS | Status: AC
  Administered 2018-03-30: 1000 mL via INTRAVENOUS

## 2018-03-30 MED ORDER — DIPHENHYDRAMINE HCL 25 MG PO CAPS: 25.0000 mg | ORAL_CAPSULE | Freq: Every evening | ORAL | Status: DC | PRN

## 2018-03-30 MED ORDER — SODIUM BICARBONATE 8.4 % IV SOLN
INTRAVENOUS | Status: AC
  Administered 2018-03-30 – 2018-03-31 (×3): via INTRAVENOUS
  Filled 2018-03-30 (×5): qty 850

## 2018-03-30 MED ORDER — LORAZEPAM 0.5 MG PO TABS
0.5000 mg | ORAL_TABLET | Freq: Four times a day (QID) | ORAL | Status: DC | PRN
  Administered 2018-03-30 – 2018-04-01 (×2): 0.5 mg via ORAL
  Filled 2018-03-30 (×2): qty 1

## 2018-03-30 NOTE — ED Triage Notes (Signed)
Patient referred to ED from cancer center at [facility] after morning labs showed patient's creatinine had increased from 1.6 last month to 7.7 this month. Patient states she started an antibiotic on Friday for a UTI and reports urinary frequency but denies any other changes in urination pattern. [place] accessed from cancer center in left chest.

## 2018-03-30 NOTE — H&P (Signed)
History and Physical  Marcia [name] WNI:[id number] DOB: 1951/07/15 DOA: 03/30/2018  Referring physician: Elvis Coil, PA PCP: Jilda Panda, MD  Outpatient Specialists: Dr. [name] (oncology) Patient coming from: Home At her baseline ambulates independently  Chief Complaint: Elevated creatinine  HPI: Marcia [name] is a 67 y.o. 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  She states that she was started on Bactrim by her primary care doctor for UTI this past Friday and has been on Bactrim up until morning of this admission.  On she was seen by her primary oncologist, Dr. [name], found to have elevated creatinine routine BMP.  She notes decreased appetite in general, but denies any diarrhea, nausea or vomiting.  No fevers or chills.  No recent sick contacts.  No changes in urination.  No swelling or new skin rashes or lesions.   ED Course: The ED patient is afebrile and hemodynamically stable.  UA with positive nitrites, small leukocytes, too numerous to count bacteria, 21-50 WBCs, hyaline casts, granular casts, crystals.  She was given 2 L normal saline bolus  Review of Systems:As mentioned in the history of present illness.  Review of systems are otherwise negative Patient seen in the ED.   Past Medical History:  Diagnosis Date  . Anemia   . Cancer [facility])    Multiple Myeloma  . Counseling regarding goals of care 02/02/2018  . Hypertension   . Primary cancer of right breast without evidence of regional lymph node metastasis (N0) ([place]) 02/01/2018   Past Surgical History:  Procedure Laterality Date  .  ABDOMINAL HYSTERECTOMY     partial  . APPENDECTOMY    . BREAST SURGERY     biopsy  . PORTACATH PLACEMENT Left 02/12/2018   Procedure: INSERTION PORT-A-CATH;  Surgeon: Fanny Skates, MD;  Location: [place];  Service: General;  Laterality: Left;  . TUBAL LIGATION     Allergies  Allergen Reactions  . Zofran [Ondansetron] Shortness Of Breath    "felt like throat was closing"  . Clarithromycin Diarrhea and Nausea And Vomiting  . Macrodantin [Nitrofurantoin] Rash   Social History:  reports that she has never smoked. She has never used smokeless tobacco. She reports that she does not drink alcohol or use drugs. Family History  Problem Relation Age of Onset  . COPD Mother   . COPD Father       Prior to Admission medications   Medication Sig Start Date End Date Taking? Authorizing Provider  acetaminophen (TYLENOL) 500 MG tablet Take 500 mg by mouth every 6 (six) hours as needed for mild pain.   Yes [provider]  doxylamine, Sleep, (UNISOM) 25 MG tablet Take 25 mg by mouth at bedtime as needed for sleep.   Yes [provider]  lidocaine-prilocaine (EMLA) cream Apply to affected area once 02/16/18  Yes Ennever, Rudell Cobb, MD  LORazepam (ATIVAN) 0.5 MG tablet TAKE 1 TABLET BY MOUTH EVERY 6 HOURS AS NEEDED FOR NAUSEA/VOMITING 03/23/18  Yes [place], Holli Humbles, NP  Meth-Hyo-M Barnett Hatter Phos-Ph Sal (URIBEL) 118 MG CAPS Take by mouth 3 (three) times daily as needed.   Yes [provider]  prochlorperazine (COMPAZINE) 10 MG tablet TAKE 1 TABLET (10 MG TOTAL)  BY MOUTH EVERY 6 (SIX) HOURS AS NEEDED (NAUSEA OR VOMITING). 03/23/18  Yes Ennever, Rudell Cobb, MD  dexamethasone (DECADRON) 4 MG tablet Take 2 pills twice a day for 5 days.  Start day before [facility] chemotherapy cycle. Patient not taking: Reported on 03/30/2018 02/01/18   [name], MD  HYDROcodone-acetaminophen (NORCO) 5-325 MG tablet Take 1-2 tablets by mouth every 6 (six) hours as needed for moderate pain or severe  pain. Patient not taking: Reported on 03/30/2018 02/12/18   Fanny Skates, MD    Physical Exam: BP 103/64 (BP Location: Left Arm)   Pulse 60   Temp 98 F (36.7 C) (Oral)   Resp 20   SpO2 100%   Constitutional chronically ill-appearing female, no acute distress Eyes: EOMI, anicteric, normal conjunctivae ENMT: Oropharynx with moist mucous membranes, normal dentition Cardiovascular: RRR no MRGs, with no peripheral edema Respiratory: Normal respiratory effort, clear breath sounds  Abdomen: Soft,non-tender, with no HSM Skin: Port in place on right chest, clean dry with no surrounding erythema or drainage, no rash ulcers, or lesions. Without skin tenting  Neurologic: Grossly no focal neuro deficit. Psychiatric:Appropriate affect, and mood. Mental status AAOx3          Labs on Admission:  Basic Metabolic Panel: Recent Labs  Lab 03/30/18 0825  NA 135  K 3.4  CL 110*  CO2 17*  GLUCOSE 147*  BUN 54*  CREATININE 7.70*  CALCIUM 8.5   Liver Function Tests: Recent Labs  Lab 03/30/18 0825  AST 20  ALT 37  ALKPHOS 87*  BILITOT 0.5  PROT 6.4  ALBUMIN 3.3*   No results for input(s): LIPASE, AMYLASE in the last 168 hours. No results for input(s): AMMONIA in the last 168 hours. CBC: Recent Labs  Lab 03/30/18 0825  WBC 26.1*  NEUTROABS 22.4*  HGB 10.8*  HCT 31.1*  MCV 94.2  PLT 228   Cardiac Enzymes: No results for input(s): CKTOTAL, CKMB, CKMBINDEX, TROPONINI in the last 168 hours.  BNP (last 3 results) No results for input(s): BNP in the last 8760 hours.  ProBNP (last 3 results) No results for input(s): PROBNP in the last 8760 hours.  CBG: No results for input(s): GLUCAP in the last 168 hours.  Radiological Exams on Admission: US Renal  Result Date: 03/30/2018 CLINICAL DATA:  67 year old hypertensive female with acute kidney insufficiency. Initial encounter. EXAM: RENAL / URINARY TRACT ULTRASOUND COMPLETE COMPARISON:  02/15/2018 ultrasound. FINDINGS: Right  Kidney: Length: 10.5 cm. Mild renal parenchymal thinning. No hydronephrosis. No mass identified. Left Kidney: Length: 10.2 cm. Mild renal parenchymal thinning. No hydronephrosis or mass. Bladder: Appears normal for degree of bladder distention. IMPRESSION: No hydronephrosis. Mild renal parenchymal thinning. Previously questioned 1.3 cm hypoechoic structure within the right kidney is not appreciated on present exam. This may be secondary to habitus and bowel gas. Attention to this on follow-up. Electronically Signed   By: Genia Del M.D.   On: 03/30/2018 19:15    Assessment/Plan Present on Admission: . Acute renal failure (ARF) ([place]) . Primary cancer of right breast without evidence of regional lymph node metastasis (N0) ([place]) . Essential hypertension . Multiple myeloma in remission [facility])  Active Problems:   Essential hypertension   Multiple myeloma in remission ([place])   Primary cancer of right breast without evidence of regional lymph node metastasis (N0) ([facility])   Acute renal failure (ARF) ([place])    1. Acute renal failure.  Unclear etiology so far.  Producing urine.  Possibly related to UTI if in  the setting of obstruction/urinary retention however patient still voiding.  On Bactrim for infection which is known to cause elevated creatinine without actual AKI.  UA positive nitrite, small leuks, many bacteria, crystals present, granular casts but no dysmorphic WBCs or RBCs or proteinuria.  Given history of multiple myeloma get SPEP, though less likely with no proteinuria. BMP, strict I's and O's, follow-up renal ultrasound, nephrology consulted in ED.Hold off on Bactrim.  IVF with isotonic sodium bicarb 100cc x 24 hours  2. 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 [name] currently undergoing third cycle of chemotherapy.  Per oncology not typically nephrotoxic agent  3. Hypertension.  Currently currently normotensive.  Not on any home BP  meds.  Monitor  4. Anemia, seems new.  Previous baseline hemoglobin within normal limits.  On admission hemoglobin 10.8.  Monitor on CBC.  Without any current signs of bleeding.   DVT prophylaxis: Heparin  Code Status: Discussed with patient and her husband on day of admission who agrees to full code  Family Communication: (Husband-Don, and daughter Lenna Sciara) family was present at bedside and updated appropriately at the time of interview.   Disposition Plan: Serial BMP, renal ultrasound, IV sodium bicarb  Consults called: Nephrology  Admission status: Admitted as inpatient to telemetry      Desiree Hane MD [facility]  Pager [phone]  If 7PM-7AM, please contact night-coverage [email or web address] Password [facility]  03/30/2018, 9:16 PM

## 2018-03-30 NOTE — Progress Notes (Signed)
Hematology and Oncology Follow Up Visit  Marcia [name] [id number] 07-25-1951 67 y.o. 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  Unfortunately, we have a real problem on her hands.  Her creatinine is 7.7.  This is totally unexpected.  I am not sure as to why she has overt renal failure.  She has not been feeling well.  She says she is not making any urine.  She said that she had some diarrhea last week.  She is on Bactrim.  I will know if the Bactrim might be causing the renal failure.  I spent close to an hour with her trying to convince her that we had to get her into the hospital so that we can figure out why she has this renal failure.  I told her that she will not need dialysis.  She will refuse dialysis.  She has had no bleeding.  Her appetite is slightly down a little bit.  I really am not sure as to what is going on.  Overall, her performance status is ECOG 2.     Medications:  Allergies as of 03/30/2018      Reactions   Zofran [ondansetron] Shortness Of Breath   "felt like throat was closing"   Clarithromycin Diarrhea, Nausea And Vomiting   Macrodantin [nitrofurantoin] Rash      Medication List        Accurate as of 03/30/18  9:26 AM. Always use your most recent med list.          dexamethasone 4 MG tablet Commonly known as:  DECADRON Take 2 pills twice a day for 5 days.  Start day before [facility] chemotherapy cycle.   doxylamine (Sleep) 25 MG tablet Commonly known as:  UNISOM Take 25 mg by mouth at bedtime as needed for sleep.   HYDROcodone-acetaminophen 5-325 MG tablet Commonly known as:   NORCO Take 1-2 tablets by mouth every 6 (six) hours as needed for moderate pain or severe pain.   lidocaine-prilocaine cream Commonly known as:  EMLA Apply to affected area once   LORazepam 0.5 MG tablet Commonly known as:  ATIVAN TAKE 1 TABLET BY MOUTH EVERY 6 HOURS AS NEEDED FOR NAUSEA/VOMITING   prochlorperazine 10 MG tablet Commonly known as:  COMPAZINE TAKE 1 TABLET (10 MG TOTAL) BY MOUTH EVERY 6 (SIX) HOURS AS NEEDED (NAUSEA OR VOMITING).   sulfamethoxazole-trimethoprim 800-160 MG tablet Commonly known as:  BACTRIM DS,SEPTRA DS Take 1 tablet by mouth 2 (two) times daily. Take one tablet twice a day times 10 days-started 03/26/18   URIBEL 118 MG Caps Take by mouth 3 (three) times daily as needed.       Allergies:  Allergies  Allergen Reactions  . Zofran [Ondansetron] Shortness Of Breath    "felt like throat was closing"  . Clarithromycin Diarrhea and Nausea And Vomiting  . Macrodantin [Nitrofurantoin] Rash    Past Medical History, Surgical history, Social history, and Family History were reviewed and updated.  Review of Systems: Review of Systems  Constitutional: Negative.   HENT: Negative.   Eyes: Negative.   Respiratory: Negative.   Cardiovascular: Negative.   Gastrointestinal: Negative.   Genitourinary: Negative.  Negative for dysuria.  Musculoskeletal: Negative.   Skin: Negative.   Neurological: Negative.   Endo/Heme/Allergies: Negative.   Psychiatric/Behavioral: Negative.       Physical Exam:  weight is 205 lb 8 oz (93.2 kg). Her oral temperature is 97.8 F (36.6 C). Her blood pressure is 102/60 and her pulse is 73. Her respiration is 22 (abnormal) and oxygen saturation is 98%.   Wt Readings from Last 3 Encounters:  03/30/18 205 lb 8 oz (93.2 kg)  03/09/18 212 lb (96.2 kg)  02/16/18 220 lb (99.8 kg)    Physical Exam  Constitutional: She is oriented to person, place, and time.  HENT:  Head: Normocephalic and atraumatic.  Mouth/Throat:  Oropharynx is clear and moist.  Eyes: Pupils are equal, round, and reactive to light. EOM are normal.  Neck: Normal range of motion.  Cardiovascular: Normal rate, regular rhythm and normal heart sounds.  Pulmonary/Chest: Effort normal and breath sounds normal.  Abdominal: Soft. Bowel sounds are normal.  Musculoskeletal: Normal range of motion. She exhibits no edema, tenderness or deformity.  Lymphadenopathy:    She has no cervical adenopathy.  Neurological: She is alert and oriented to person, place, and time.  Skin: Skin is warm and dry. No rash noted. No erythema.  Psychiatric: She has a normal mood and affect. Her behavior is normal. Judgment and thought content normal.  Vitals reviewed.    Lab Results  Component Value Date   WBC 26.1 (H) 03/30/2018   HGB 10.8 (L) 03/30/2018   HCT 31.1 (L) 03/30/2018   MCV 94.2 03/30/2018   PLT 228 03/30/2018   Lab Results  Component Value Date   FERRITIN 338 (H) 07/31/2011   IRON 36 (L) 03/21/2009   TIBC 232 (L) 03/21/2009   UIBC 196 03/21/2009   IRONPCTSAT 16 (L) 03/21/2009   Lab Results  Component Value Date   RETICCTPCT 1.3 03/27/2011   RBC 3.30 (L) 03/30/2018   RETICCTABS 56.4 03/27/2011   Lab Results  Component Value Date   KPAFRELGTCHN 23.1 (H) 02/16/2018   LAMBDASER 15.8 02/16/2018   KAPLAMBRATIO 1.46 02/16/2018   Lab Results  Component Value Date   IGGSERUM 924 02/16/2018   IGA 222 02/16/2018   IGMSERUM 126 02/16/2018   Lab Results  Component Value Date   TOTALPROTELP 6.9 02/16/2018   ALBUMINELP 3.6 02/16/2018   A1GS 0.2 02/16/2018   A2GS 0.8 02/16/2018   BETS 1.3 02/16/2018   BETA2SER 0.4 07/02/2015   GAMS 1.0 02/16/2018   MSPIKE Not Observed 02/16/2018   SPEI * 07/02/2015     Chemistry      Component Value Date/Time   NA 135 03/30/2018 0825   NA 141 07/06/2017 0816   NA 141 01/05/2017 0956   K 3.4 03/30/2018 0825   K 3.6 07/06/2017 0816   K 4.3 01/05/2017 0956   CL 110 (H) 03/30/2018 0825   CL 101  07/06/2017 0816   CO2 17 (L) 03/30/2018 0825   CO2 28 07/06/2017 0816   CO2 23 01/05/2017 0956   BUN 54 (H) 03/30/2018 0825   BUN 28 (H) 07/06/2017 0816   BUN 22.4 01/05/2017 0956   CREATININE 7.70 (HH) 03/30/2018 0825   CREATININE 1.6 (H) 07/06/2017 0816   CREATININE 1.4 (H) 01/05/2017 0956      Component Value Date/Time   CALCIUM 8.5 03/30/2018 0825   CALCIUM 9.1 07/06/2017 0816   CALCIUM 9.3 01/05/2017 0956   ALKPHOS 87 (H) 03/30/2018 0825   ALKPHOS 58 07/06/2017 0816  ALKPHOS 52 01/05/2017 0956   AST 20 03/30/2018 0825   AST 14 01/05/2017 0956   ALT 37 03/30/2018 0825   ALT 58 (H) 07/06/2017 0816   ALT 13 01/05/2017 0956   BILITOT 0.5 03/30/2018 0825   BILITOT 0.56 01/05/2017 0956      Impression and Plan: Ms. Rosman is a very pleasant 67 yo caucasian female with past history of kappa light chain myeloma with stem cell transplant. She has now been diagnosed with locally advanced infiltrating ductal carcinoma of the right breast, ER(-)/PR(-)/HER-2(+), Ki67 of 75%.   For right now, we will have to put a hold on her chemotherapy.  Again, I cannot imagine that her chemotherapy is a factor here.  I have never had a patient develop renal failure on this protocol.  The medications that we use are not known to be nephrotoxic.  We will get her to [facility] emergency room at [facility].  I will plan to see her while she is in the hospital.  Hopefully, this is the Bactrim doing this.  Again, I spent almost an hour with she and her husband.  Her husband was very helpful in trying to convince her to go to the hospital.  She really was not that excited to do this but she has agreed.  [name], MD 5/7/20199:26 AM

## 2018-03-30 NOTE — ED Notes (Addendum)
Patient transported to Ultrasound 

## 2018-03-30 NOTE — ED Notes (Signed)
Patient has CBC and CMP results in chart from cancer center today.

## 2018-03-30 NOTE — ED Triage Notes (Addendum)
Patient sent to [facility] from cancer center, was scheduled for chemotherapy this morning but was told to come to ED due to "kidney problem". Creatinine 7.7 today versus 1.6 three weeks ago.

## 2018-03-30 NOTE — Consult Note (Signed)
Referring Provider: No ref. provider found Primary Care Physician:  Jilda Panda, MD Primary Nephrologist:   Reason for Consultation:  Acute renal failure  Metabolic acidosis   HPI:  This is a 67 year old lady with infiltrating ductal carcinoma right breast recently diagnosed in Feb 2019  She underwent two cycles of cysplatinum based chemotherapy in April 2019   Past history of Kappa Light Chain Myeloma  2011 [facility] She has chronic renal failure baseline creatinine 1.4 - 1.6 and has been followed by Dr Marval Regal Her creatinine was found to have increased to 7.7 at the [facility]   She has been treated for a urinary tract infection with bactrim since Thursday 5/2  For the last several weeks she has had no appetite although denies vomiting and has had no pain She has a portacath and has had no fever or chills   She has had no swelling or shortness of breath and no sores and no lesions     Past Medical History:  Diagnosis Date  . Anemia   . Cancer [facility])    Multiple Myeloma  . Counseling regarding goals of care 02/02/2018  . Hypertension   . Primary cancer of right breast without evidence of regional lymph node metastasis (N0) ([place]) 02/01/2018    Past Surgical History:  Procedure Laterality Date  . ABDOMINAL HYSTERECTOMY     partial  . APPENDECTOMY    . BREAST SURGERY     biopsy  . PORTACATH PLACEMENT Left 02/12/2018   Procedure: INSERTION PORT-A-CATH;  Surgeon: Fanny Skates, MD;  Location: [place];  Service: General;  Laterality: Left;  . TUBAL LIGATION      Prior to Admission medications   Medication Sig Start Date End Date Taking? Authorizing Provider  acetaminophen (TYLENOL) 500 MG tablet Take 500 mg by mouth every 6 (six) hours as needed for mild pain.   Yes [provider]  doxylamine, Sleep, (UNISOM) 25 MG tablet Take 25 mg by mouth at bedtime as needed for sleep.   Yes [provider]  lidocaine-prilocaine (EMLA) cream Apply to affected area once  02/16/18  Yes Ennever, Rudell Cobb, MD  LORazepam (ATIVAN) 0.5 MG tablet TAKE 1 TABLET BY MOUTH EVERY 6 HOURS AS NEEDED FOR NAUSEA/VOMITING 03/23/18  Yes [place], Holli Humbles, NP  Meth-Hyo-M Barnett Hatter Phos-Ph Sal (URIBEL) 118 MG CAPS Take by mouth 3 (three) times daily as needed.   Yes [provider]  prochlorperazine (COMPAZINE) 10 MG tablet TAKE 1 TABLET (10 MG TOTAL) BY MOUTH EVERY 6 (SIX) HOURS AS NEEDED (NAUSEA OR VOMITING). 03/23/18  Yes Ennever, Rudell Cobb, MD  dexamethasone (DECADRON) 4 MG tablet Take 2 pills twice a day for 5 days.  Start day before [facility] chemotherapy cycle. Patient not taking: Reported on 03/30/2018 02/01/18   [name], MD  HYDROcodone-acetaminophen (NORCO) 5-325 MG tablet Take 1-2 tablets by mouth every 6 (six) hours as needed for moderate pain or severe pain. Patient not taking: Reported on 03/30/2018 02/12/18   Fanny Skates, MD    Current Facility-Administered Medications  Medication Dose Route Frequency Provider Last Rate Last Dose  . sodium chloride 0.9 % bolus 1,000 mL  1,000 mL Intravenous Once McDonald, Mia A, PA-C 984 mL/hr at 03/30/18 1658 1,000 mL at 03/30/18 1658   Current Outpatient Medications  Medication Sig Dispense Refill  . acetaminophen (TYLENOL) 500 MG tablet Take 500 mg by mouth every 6 (six) hours as needed for mild pain.    Marland Kitchen doxylamine, Sleep, (UNISOM) 25  MG tablet Take 25 mg by mouth at bedtime as needed for sleep.    Marland Kitchen lidocaine-prilocaine (EMLA) cream Apply to affected area once 30 g 3  . LORazepam (ATIVAN) 0.5 MG tablet TAKE 1 TABLET BY MOUTH EVERY 6 HOURS AS NEEDED FOR NAUSEA/VOMITING 30 tablet 0  . Meth-Hyo-M Bl-Na Phos-Ph Sal (URIBEL) 118 MG CAPS Take by mouth 3 (three) times daily as needed.    . prochlorperazine (COMPAZINE) 10 MG tablet TAKE 1 TABLET (10 MG TOTAL) BY MOUTH EVERY 6 (SIX) HOURS AS NEEDED (NAUSEA OR VOMITING). 30 tablet 1  . dexamethasone (DECADRON) 4 MG tablet Take 2 pills twice a day for 5 days.  Start day before [facility]  chemotherapy cycle. (Patient not taking: Reported on 03/30/2018) 120 tablet 0  . HYDROcodone-acetaminophen (NORCO) 5-325 MG tablet Take 1-2 tablets by mouth every 6 (six) hours as needed for moderate pain or severe pain. (Patient not taking: Reported on 03/30/2018) 20 tablet 0    Allergies as of 03/30/2018 - Review Complete 03/30/2018  Allergen Reaction Noted  . Zofran [ondansetron] Shortness Of Breath 03/30/2018  . Clarithromycin Diarrhea and Nausea And Vomiting   . Macrodantin [nitrofurantoin] Rash 03/09/2018    Family History  Problem Relation Age of Onset  . COPD Mother   . COPD Father     Social History   Socioeconomic History  . Marital status: Married    Spouse name: Not on file  . Number of children: Not on file  . Years of education: Not on file  . Highest education level: Not on file  Occupational History  . Not on file  Social Needs  . Financial resource strain: Not on file  . Food insecurity:    Worry: Not on file    Inability: Not on file  . Transportation needs:    Medical: Not on file    Non-medical: Not on file  Tobacco Use  . Smoking status: Never Smoker  . Smokeless tobacco: Never Used  . Tobacco comment: never used product  Substance and Sexual Activity  . Alcohol use: Never    Alcohol/week: 0.0 oz    Frequency: Never  . Drug use: Never  . Sexual activity: Not on file  Lifestyle  . Physical activity:    Days per week: Not on file    Minutes per session: Not on file  . Stress: Not on file  Relationships  . Social connections:    Talks on phone: Not on file    Gets together: Not on file    Attends religious service: Not on file    Active member of club or organization: Not on file    Attends meetings of clubs or organizations: Not on file    Relationship status: Not on file  . Intimate partner violence:    Fear of current or ex partner: Not on file    Emotionally abused: Not on file    Physically abused: Not on file    Forced sexual activity:  Not on file  Other Topics Concern  . Not on file  Social History Narrative  . Not on file    Review of Systems: Gen: Denies any fever, chills, sweats, anorexia, fatigue, weakness, malaise, weight loss, and sleep disorder HEENT: No visual complaints, No history of Retinopathy. Normal external appearance No Epistaxis or Sore throat. No sinusitis.   CV: Denies chest pain, angina, palpitations, syncope, orthopnea, PND, peripheral edema, and claudication. Resp: Denies dyspnea at rest, dyspnea with exercise, cough, sputum, wheezing, coughing  up blood, and pleurisy. GI: Denies vomiting blood, jaundice, and fecal incontinence.   Denies dysphagia or odynophagia has had decreased appetite. GU  She was treated for UTI but no blood in urine, urinary frequency, urinary hesitancy, nocturnal urination, and urinary incontinence.  No renal calculi. MS: Denies joint pain, limitation of movement, and swelling, stiffness, low back pain, extremity pain. Denies muscle weakness, cramps,  atrophy.  No use of non steroidal antiinflammatory drugs. Derm: Denies rash, itching, dry skin, hives, moles, warts, or unhealing ulcers.  Psych: Denies depression, anxiety, memory loss, suicidal ideation, hallucinations, paranoia, and confusion. Heme: Denies bruising, bleeding, and enlarged lymph nodes. Neuro: No headache.  No diplopia. No dysarthria.  No dysphasia.  No history of CVA.  No Seizures. No paresthesias.  No weakness. Endocrine No DM.  No Thyroid disease.  No Adrenal disease.  Physical Exam: Vital signs in last 24 hours: Temp:  [97.7 F (36.5 C)-98.5 F (36.9 C)] 98.5 F (36.9 C) (05/07 1656) Pulse Rate:  [59-76] 62 (05/07 1730) Resp:  [16-22] 16 (05/07 1730) BP: (95-123)/(60-86) 113/70 (05/07 1730) SpO2:  [98 %-100 %] 100 % (05/07 1730) Weight:  [205 lb 8 oz (93.2 kg)] 205 lb 8 oz (93.2 kg) (05/07 0859)   General:   Ill appearing lady in no distress  Head:  Normocephalic and atraumatic. Eyes:  Sclera clear,  no icterus.   Conjunctiva pink. Ears:  Normal auditory acuity. Nose:  No deformity, discharge,  or lesions. Mouth:  No deformity or lesions, dentition normal. Neck:  Supple; no masses or thyromegaly. JVP not elevated Lungs:  Clear throughout to auscultation.   No wheezes, crackles, or rhonchi. No acute distress. Heart:  Regular rate and rhythm; no murmurs, clicks, rubs,  or gallops. Abdomen:  Soft, nontender and nondistended. No masses, hepatosplenomegaly or hernias noted. Normal bowel sounds, without guarding, and without rebound.   Msk:  Symmetrical without gross deformities. Normal posture. Pulses:  No carotid, renal, femoral bruits. DP and PT symmetrical and equal Extremities:  Without clubbing or edema. Neurologic:  Alert and  oriented x4;  grossly normal neurologically. Skin:  Intact without significant lesions or rashes.   Intake/Output from previous day: No intake/output data recorded. Intake/Output this shift: No intake/output data recorded.  Lab Results: Recent Labs    03/30/18 0825  WBC 26.1*  HGB 10.8*  HCT 31.1*  PLT 228   BMET Recent Labs    03/30/18 0825  NA 135  K 3.4  CL 110*  CO2 17*  GLUCOSE 147*  BUN 54*  CREATININE 7.70*  CALCIUM 8.5   LFT Recent Labs    03/30/18 0825  PROT 6.4  ALBUMIN 3.3*  AST 20  ALT 37  ALKPHOS 87*  BILITOT 0.5   PT/INR No results for input(s): LABPROT, INR in the last 72 hours. Hepatitis Panel No results for input(s): HEPBSAG, HCVAB, HEPAIGM, HEPBIGM in the last 72 hours.  Studies/Results: No results found.  Assessment/Plan:  Acute renal failure  - This seems to be related to the use of bactrim and may be interstitial inflammation  There could be a direct affect of the medication that may be interfering with excretion of creatinine. A renal ultrasound would be reasonable to rule out obstruction. There have been no episodes of hypotension that could have lead to a decrease in renal perfusion. There is no  evidence of proteinuria noted and no red blood cells making a glomerulonephritis unlikely. This also would make amyloid or light chain disease not a likely cause  Hypertension controlled at this point  Anemia Hb has been stable will follow   Certainly > 10  Metabolic acidosis  Bicarbonate < 20  I think it reasonable to continue IV bicarbonate at 100 cc/hr    LOS: 0 Marcia [name] W _0 _1 :39 PM

## 2018-03-30 NOTE — ED Provider Notes (Signed)
[facility] EMERGENCY DEPARTMENT Provider Note   CSN: [id number] Arrival date & time: 03/30/18  1038     History   Chief Complaint Chief Complaint  Patient presents with  . Acute Renal Failure    HPI Marcia [name] is a 67 y.o. 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 Creatinine was 7.7 when checked earlier today. Her baseline has 1.4-1.6 over the last 4 years, 1.6 when checked 3 weeks ago.   She reports that she was seen by her PCP, Dr. Abbott Pao, 4 days ago and was diagnosed with a UTI and started on Bactrim 800 mg and Uribel.  She endorses fatigue, nausea, urinary hesitancy for the last 4 days.  She reports that she has had decreased urine output over the last 4 days despite good fluid intake, but has been voiding large amounts without difficulty today.  Last dose of Bactrim was 8:30 AM.  She reports that her temperature measured 99 at home 5 days ago and she was feeling cold, but her symptoms improved after taking Tylenol.  She also endorses nonbloody diarrhea over the last few days that resolved yesterday after taking Imodium.  She denies vomiting, constipation, vaginal pain or discharge, chest pain, dyspnea, rash, headache, syncope, dizziness, lightheadedness.  Oncologist; Dr. [name].  She started chemotherapy on March 26.  She had a port placed in March.  She has a history of creatinine between 9.0-10.0 in 2010 before she her multiple myeloma went into remission.   Current Therapy:        Neoadjuvant carboplatinum/Taxotere Herceptin/Perjeta- s/p cycle #2  She was seen earlier today by oncology who does not feel that her chemotherapy is a factor in her acute renal failure as he has never had a patient developed renal failure on  this protocol and the medications are not known to be nephrotoxic.  The history is provided by the patient. No language interpreter was used.    Past Medical History:  Diagnosis Date  . Anemia   . Cancer [facility])    Multiple Myeloma  . Counseling regarding goals of care 02/02/2018  . Hypertension   . Primary cancer of right breast without evidence of regional lymph node metastasis (N0) ([place]) 02/01/2018    Patient Active Problem List   Diagnosis Date Noted  . Acute renal failure (ARF) ([place]) 03/30/2018  . Counseling regarding goals of care 02/02/2018  . Primary cancer of right breast without evidence of regional lymph node metastasis (N0) ([place]) 02/01/2018  . Gout 01/04/2018  . Multiple myeloma in remission ([place]) 06/02/2013  . Essential hypertension 07/30/2007    Past Surgical History:  Procedure Laterality Date  . ABDOMINAL HYSTERECTOMY     partial  . APPENDECTOMY    . BREAST SURGERY     biopsy  . PORTACATH PLACEMENT Left 02/12/2018   Procedure: INSERTION PORT-A-CATH;  Surgeon: Fanny Skates, MD;  Location: [facility];  Service: General;  Laterality: Left;  . TUBAL LIGATION       OB History   None      Home Medications    Prior to Admission medications   Medication Sig Start Date End Date Taking? Authorizing Provider  acetaminophen (TYLENOL) 500 MG tablet Take 500 mg by mouth every 6 (six) hours as needed for mild pain.  Yes [provider]  doxylamine, Sleep, (UNISOM) 25 MG tablet Take 25 mg by mouth at bedtime as needed for sleep.   Yes [provider]  lidocaine-prilocaine (EMLA) cream Apply to affected area once 02/16/18  Yes Ennever, Rudell Cobb, MD  LORazepam (ATIVAN) 0.5 MG tablet TAKE 1 TABLET BY MOUTH EVERY 6 HOURS AS NEEDED FOR NAUSEA/VOMITING 03/23/18  Yes [place], Holli Humbles, NP  prochlorperazine (COMPAZINE) 10 MG tablet TAKE 1 TABLET (10 MG TOTAL) BY MOUTH EVERY 6 (SIX) HOURS AS NEEDED (NAUSEA OR VOMITING). 03/23/18  Yes [name], MD     Family History Family History  Problem Relation Age of Onset  . COPD Mother   . COPD Father     Social History Social History   Tobacco Use  . Smoking status: Never Smoker  . Smokeless tobacco: Never Used  . Tobacco comment: never used product  Substance Use Topics  . Alcohol use: Never    Alcohol/week: 0.0 oz    Frequency: Never  . Drug use: Never     Allergies   Zofran [ondansetron]; Clarithromycin; and Macrodantin [nitrofurantoin]   Review of Systems Review of Systems  Constitutional: Positive for chills and fever. Negative for activity change.  Eyes: Negative for visual disturbance.  Respiratory: Negative for cough and shortness of breath.   Cardiovascular: Negative for chest pain.  Gastrointestinal: Positive for diarrhea and nausea. Negative for abdominal pain and vomiting.  Genitourinary: Positive for dysuria and frequency. Negative for hematuria, vaginal discharge and vaginal pain.  Musculoskeletal: Negative for back pain, myalgias and neck pain.  Skin: Negative for rash.  Allergic/Immunologic: Positive for immunocompromised state.  Neurological: Negative for headaches.  Psychiatric/Behavioral: Negative for confusion.   Physical Exam Updated Vital Signs BP 113/70   Pulse 63   Temp 98.5 F (36.9 C) (Rectal)   Resp (!) 23   SpO2 99%   Physical Exam  Constitutional: No distress.  Obese female  HENT:  Head: Normocephalic.  Eyes: Conjunctivae are normal. No scleral icterus.  Neck: Neck supple.  Cardiovascular: Normal rate, regular rhythm, normal heart sounds and intact distal pulses. Exam reveals no gallop and no friction rub.  No murmur heard. Pulmonary/Chest: Effort normal. No stridor. No respiratory distress. She has no wheezes. She has no rales. She exhibits no tenderness.  Port in place over the left anterior chest.  No surrounding erythema, edema, or warmth.  Abdominal: Soft. She exhibits no distension and no mass. There is tenderness. There  is no rebound and no guarding. No hernia.  Tender to palpation of the right CVA.  No left CVA tenderness.  Mild suprapubic tenderness without rebound or guarding.  Abdomen is obese, soft, nondistended.  No peritoneal signs.  Normoactive bowel sounds.  Musculoskeletal: She exhibits edema. She exhibits no tenderness.  Mild edema to the bilateral lower extremities.  Neurological: She is alert.  Skin: Skin is warm. Capillary refill takes less than 2 seconds. No rash noted. She is not diaphoretic.  Psychiatric: Her behavior is normal.  Nursing note and vitals reviewed.    ED Treatments / Results  Labs (all labs ordered are listed, but only abnormal results are displayed) Labs Reviewed  URINALYSIS, ROUTINE W REFLEX MICROSCOPIC - Abnormal; Notable for the following components:      Result Value   Color, Urine BLUE (*)    Nitrite POSITIVE (*)    Leukocytes, UA SMALL (*)    Bacteria, UA MANY (*)    Crystals PRESENT (*)    All other  components within normal limits  URINE CULTURE    EKG None  Radiology No results found.  Procedures Procedures (including critical care time)  Medications Ordered in ED Medications  sodium bicarbonate 150 mEq in sterile water 1,000 mL infusion (has no administration in time range)  sodium chloride 0.9 % bolus 1,000 mL (1,000 mLs Intravenous Transfusing/Transfer 03/30/18 1757)     Initial Impression / Assessment and Plan / ED Course  I have reviewed the triage vital signs and the nursing notes.  Pertinent labs & imaging results that were available during my care of the patient were reviewed by me and considered in my medical decision making (see chart for details).     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   Patient was discussed with Dr. Tomi Bamberger, attending physician.   She is afebrile and hemodynamically stable.  Labs are notable for blue urine,  BUN/CR 54/7.7 <10:1, leukocytosis of 26, and elevated absolute neutrophil count of 22.4.  Blue urine is likely secondary to the patient taking Uribel.  The patient is taking Decadron, last dose was 8:30 AM, which could be contributory to her leukocytosis, but urinalysis is concerning for UTI. Urine culture has been sent. Renal ultrasound is pending.  Dr. Justin Mend with nephrology has been consulted and will be coming to see the patient. Spoke with Dr. Lonny Prude, hospitalist, who will admit the patient. The patient appears reasonably stabilized for admission considering the current resources, flow, and capabilities available in the ED at this time, and I doubt any other [facility] requiring further screening and/or treatment in the ED prior to admission.  Final Clinical Impressions(s) / ED Diagnoses   Final diagnoses:  Acute renal failure, unspecified acute renal failure type [facility])    ED Discharge Orders    None       Joanne Gavel, PA-C 03/30/18 1800    Dorie Rank, MD 03/31/18 0010

## 2018-03-30 NOTE — Telephone Encounter (Signed)
Critical Value Creatinine 7.7 Dr [name] notified. No orders at this time

## 2018-03-30 NOTE — Progress Notes (Signed)
Pt. Came up to floor from ER via stretcher.  PT is alert and oriented.  Vital Signs are stable.   Cardiac Monitoring is hooked up.  Central Telemetry is notified and box is verified.  Family is at bedside.

## 2018-03-31 DIAGNOSIS — Z171 Estrogen receptor negative status [ER-]: Secondary | ICD-10-CM

## 2018-03-31 DIAGNOSIS — C50919 Malignant neoplasm of unspecified site of unspecified female breast: Secondary | ICD-10-CM

## 2018-03-31 DIAGNOSIS — N390 Urinary tract infection, site not specified: Secondary | ICD-10-CM | POA: Diagnosis present

## 2018-03-31 LAB — CBC
HCT: 28 % — ABNORMAL LOW (ref 36.0–46.0)
HEMOGLOBIN: 9.4 g/dL — AB (ref 12.0–15.0)
MCH: 31.2 pg (ref 26.0–34.0)
MCHC: 33.6 g/dL (ref 30.0–36.0)
MCV: 93 fL (ref 78.0–100.0)
Platelets: 207 10*3/uL (ref 150–400)
RBC: 3.01 MIL/uL — ABNORMAL LOW (ref 3.87–5.11)
RDW: 16.1 % — AB (ref 11.5–15.5)
WBC: 30.2 10*3/uL — ABNORMAL HIGH (ref 4.0–10.5)

## 2018-03-31 LAB — COMPREHENSIVE METABOLIC PANEL
ALBUMIN: 2.8 g/dL — AB (ref 3.5–5.0)
ALT: 25 U/L (ref 14–54)
ANION GAP: 11 (ref 5–15)
AST: 22 U/L (ref 15–41)
Alkaline Phosphatase: 74 U/L (ref 38–126)
BILIRUBIN TOTAL: 0.5 mg/dL (ref 0.3–1.2)
BUN: 62 mg/dL — AB (ref 6–20)
CHLORIDE: 105 mmol/L (ref 101–111)
CO2: 20 mmol/L — AB (ref 22–32)
Calcium: 8 mg/dL — ABNORMAL LOW (ref 8.9–10.3)
Creatinine, Ser: 6.73 mg/dL — ABNORMAL HIGH (ref 0.44–1.00)
GFR calc Af Amer: 7 mL/min — ABNORMAL LOW (ref >60)
GFR calc non Af Amer: 6 mL/min — ABNORMAL LOW (ref >60)
GLUCOSE: 107 mg/dL — AB (ref 65–99)
POTASSIUM: 3.1 mmol/L — AB (ref 3.5–5.1)
SODIUM: 136 mmol/L (ref 135–145)
Total Protein: 5.2 g/dL — ABNORMAL LOW (ref 6.5–8.1)

## 2018-03-31 LAB — GLUCOSE, CAPILLARY: Glucose-Capillary: 93 mg/dL (ref 65–99)

## 2018-03-31 MED ORDER — CEFTRIAXONE SODIUM 1 G IJ SOLR
1.0000 g | INTRAMUSCULAR | Status: DC
Start: 2018-03-31 — End: 2018-04-02
  Administered 2018-03-31 – 2018-04-02 (×3): 1 g via INTRAVENOUS
  Filled 2018-03-31 (×3): qty 10

## 2018-03-31 MED ORDER — POTASSIUM CHLORIDE CRYS ER 20 MEQ PO TBCR
40.0000 meq | EXTENDED_RELEASE_TABLET | Freq: Once | ORAL | Status: AC
  Administered 2018-03-31: 40 meq via ORAL
  Filled 2018-03-31: qty 2

## 2018-03-31 NOTE — Consult Note (Signed)
Referral MD  Reason for Referral: Acute renal failure; neoadjuvant therapy for locally advanced breast cancer of the right breast; past history of light chain myeloma-remission  Chief Complaint  Patient presents with  . Acute Renal Failure  : I was not feeling well.  HPI: [name] is well-known to me.  She is a very nice 67 year old white female.  I have known her for over 10 years.  I initially saw her when she was diagnosed with light chain myeloma.  We got her to transplant at [facility].  This was probably 9 years ago.  She is been in remission since.  She presented with renal failure.  This improved with myeloma therapy.  She then presented with 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  She has been on neoadjuvant treatment.  She has NOT received cis-platinum.  She is gotten carboplatinum which is the preferred agent for patients with renal insufficiency.  She has had 2 treatments.  She was in the office yesterday for her third cycle of 4.  She was not feeling well.  She said that she had a urinary tract infection.  She saw her family doctor.  He put her on Bactrim which was a very good choice.  When we got her lab work yesterday, her creatinine was 7.7.  She was certainly not that dehydrated.  She says she was not making urine.  She was not having diarrhea.  She was not having cough.  She was not bleeding.  She was admitted.  She feels a lot better.  She is getting IV hydration.  Her creatinine is 6.7 today.  She  has been seen by Dr. Justin Mend of nephrology.  As always, he provides incredibly helpful input and recommendations.  Her white cell count is quite high.  This might be from the Neulasta that she receives with her treatments.  Her urinalysis looks like it does show bacteria and probably infected.  Cultures are pending.  She had a renal ultrasound done.  This really did not show much in the way of hydronephrosis.  There is no obstruction.  There is mild renal  parenchymal thinning.  Overall, I would say that her performance status is ECOG 1.   Past Medical History:  Diagnosis Date  . Anemia   . Cancer [facility])    Multiple Myeloma  . Counseling regarding goals of care 02/02/2018  . Hypertension   . Primary cancer of right breast without evidence of regional lymph node metastasis (N0) ([place]) 02/01/2018  :  Past Surgical History:  Procedure Laterality Date  . ABDOMINAL HYSTERECTOMY     partial  . APPENDECTOMY    . BREAST SURGERY     biopsy  . PORTACATH PLACEMENT Left 02/12/2018   Procedure: INSERTION PORT-A-CATH;  Surgeon: Fanny Skates, MD;  Location: [facility];  Service: General;  Laterality: Left;  . TUBAL LIGATION    :   Current Facility-Administered Medications:  .  acetaminophen (TYLENOL) tablet 500 mg, 500 mg, Oral, Q6H PRN, Oretha Milch D, MD .  diphenhydrAMINE (BENADRYL) capsule 25 mg, 25 mg, Oral, QHS PRN, Oretha Milch D, MD .  heparin injection 5,000 Units, 5,000 Units, Subcutaneous, Q8H, Oretha Milch D, MD, 5,000 Units at 03/31/18 0526 .  lidocaine-prilocaine (EMLA) cream, , Topical, Q4H PRN, Oretha Milch D, MD .  LORazepam (ATIVAN) tablet 0.5 mg, 0.5 mg, Oral, Q6H PRN, Oretha Milch D, MD, 0.5 mg at 03/30/18 2229 .  prochlorperazine (COMPAZINE) tablet 10 mg, 10 mg, Oral, Q6H PRN,  Oretha Milch D, MD .  senna-docusate (Senokot-S) tablet 1 tablet, 1 tablet, Oral, QHS PRN, Oretha Milch D, MD .  sodium bicarbonate 150 mEq in sterile water 1,000 mL infusion, , Intravenous, Continuous, Oretha Milch D, MD, Last Rate: 100 mL/hr at 03/31/18 0425 .  sodium chloride flush (NS) 0.9 % injection 10-40 mL, 10-40 mL, Intracatheter, PRN, Oretha Milch D, MD, 10 mL at 03/31/18 0441:  . heparin  5,000 Units Subcutaneous Q8H  :  Allergies  Allergen Reactions  . Zofran [Ondansetron] Shortness Of Breath    "felt like throat was closing"  . Clarithromycin Diarrhea and Nausea And Vomiting  . Macrodantin [Nitrofurantoin] Rash   :  Family History  Problem Relation Age of Onset  . COPD Mother   . COPD Father   :  Social History   Socioeconomic History  . Marital status: Married    Spouse name: Not on file  . Number of children: Not on file  . Years of education: Not on file  . Highest education level: Not on file  Occupational History  . Not on file  Social Needs  . Financial resource strain: Not on file  . Food insecurity:    Worry: Not on file    Inability: Not on file  . Transportation needs:    Medical: Not on file    Non-medical: Not on file  Tobacco Use  . Smoking status: Never Smoker  . Smokeless tobacco: Never Used  . Tobacco comment: never used product  Substance and Sexual Activity  . Alcohol use: Never    Alcohol/week: 0.0 oz    Frequency: Never  . Drug use: Never  . Sexual activity: Not on file  Lifestyle  . Physical activity:    Days per week: Not on file    Minutes per session: Not on file  . Stress: Not on file  Relationships  . Social connections:    Talks on phone: Not on file    Gets together: Not on file    Attends religious service: Not on file    Active member of club or organization: Not on file    Attends meetings of clubs or organizations: Not on file    Relationship status: Not on file  . Intimate partner violence:    Fear of current or ex partner: Not on file    Emotionally abused: Not on file    Physically abused: Not on file    Forced sexual activity: Not on file  Other Topics Concern  . Not on file  Social History Narrative  . Not on file  :  Pertinent items are noted in HPI.  Exam: Patient Vitals for the past 24 hrs:  BP Temp Temp src Pulse Resp SpO2  03/30/18 2119 (!) 100/57 97.7 F (36.5 C) Oral 67 18 100 %  03/30/18 1902 103/64 98 F (36.7 C) Oral 60 20 100 %  03/30/18 1745 - - - 63 (!) 23 99 %  03/30/18 1730 113/70 - - 62 16 100 %  03/30/18 1715 112/65 - - 63 (!) 21 99 %  03/30/18 1700 110/64 - - (!) 59 17 99 %  03/30/18 1656 - 98.5  F (36.9 C) Rectal - - -  03/30/18 1654 - - - 62 (!) 21 100 %  03/30/18 1645 114/86 - - 76 - 100 %  03/30/18 1630 104/74 - - 63 - 100 %  03/30/18 1615 115/72 - - 66 - 99 %  03/30/18 1600 117/72 - -  67 - 99 %  03/30/18 1545 123/69 - - 69 - 100 %  03/30/18 1539 116/68 - - 70 18 100 %  03/30/18 1345 99/69 - - 76 16 100 %  03/30/18 1223 105/63 97.7 F (36.5 C) Oral 72 16 100 %  03/30/18 1134 95/69 97.8 F (36.6 C) Oral 75 16 100 %     Recent Labs    03/30/18 0825 03/31/18 0431  WBC 26.1* 30.2*  HGB 10.8* 9.4*  HCT 31.1* 28.0*  PLT 228 207   Recent Labs    03/30/18 0825 03/31/18 0431  NA 135 136  K 3.4 3.1*  CL 110* 105  CO2 17* 20*  GLUCOSE 147* 107*  BUN 54* 62*  CREATININE 7.70* 6.73*  CALCIUM 8.5 8.0*    Blood smear review: None  Pathology: None    Assessment and Plan: Ms. Pesnell is a very nice 67 year old white female with acute renal failure.  Hopefully, this is from the Bactrim that she received.  Again, she is on carboplatinum which is used in patients who have renal failure for chemotherapy.  As such, I do not think this would be an issue.  Is possible that she may have a component of dehydration that could also have contributed to the elevated creatinine.  I know that she will get better.  Looks like her creatinine is starting to improve with hydration.  Hopefully, with the Bactrim out of her system, we will see the creatinine get back to baseline.  We will have to see what her urine looks like.  We will see if there is a infection.  Her elevated white cell count could clearly be from infection.  It could also be from Neulasta that she received from her second cycle of treatment.  I know that she will get fantastic care from everybody on [address].  [name], MD  Proverbs  28:13

## 2018-03-31 NOTE — Progress Notes (Addendum)
[facility]                                                                              Patient Demographics  Marcia [name], is a 67 y.o. female, DOB - 12-11-1950, IHW:[id number]  Admit date - 03/30/2018   Admitting Physician Desiree Hane, MD  Outpatient Primary MD for the patient is Jilda Panda, MD  Outpatient specialists:   LOS - 1  days   Medical records reviewed and are as summarized below:    Chief Complaint  Patient presents with  . Acute Renal Failure       Brief summary   Marcia [name] is a 67 y.o. 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  She states that she was started on Bactrim by her primary care doctor for UTI this past Friday and has been on Bactrim up until morning of this admission.  On she was seen by her primary oncologist, Dr. [name], found to have elevated creatinine routine BMP.  She notes decreased appetite in general, otherwise no acute complaints.  Assessment & Plan    Principal Problem:   Acute renal failure (ARF) ([place]): Baseline creatinine 1.3-1.6 -Presented with creatinine of 7.7 likely due to Bactrim, interstitial inflammation and UTI. -Continue IV fluid hydration, renal ultrasound did not show hydronephrosis or obstruction -Creatinine improving, 6.7 today - continue IV fluid hydration, nephrology following  Active Problems:   Essential hypertension -Currently controlled     Multiple myeloma in remission ([place]), primary right breast CA -Follows Dr. [name]    Acute lower UTI -Obtained requested urine culture results, E. coli,  resistant to Bactrim -Follow urine culture and sensitivities inpatient, placed on IV Rocephin per the culture results from the PCP  Metabolic acidosis -Improving, continue IV bicarb  Hypokalemia -Replaced  Leukocytosis -Possibly due to UTI versus Neulasta, currently undergoing chemotherapy - follow blood cultures x2  Code Status: Full CODE STATUS DVT Prophylaxis:  SCD's Family Communication: Discussed in detail with the patient, all imaging results, lab results explained to the patient and daughter at the bedside   Disposition Plan: Once creatinine improving  Time Spent in minutes 25 minutes  Procedures:  Renal ultrasound  Consultants:   Nephrology  Antimicrobials:  IV Rocephin 5/8  Medications  Scheduled Meds: . heparin  5,000 Units Subcutaneous Q8H   Continuous Infusions: . cefTRIAXone (ROCEPHIN)  IV Stopped (03/31/18 1223)  .  sodium bicarbonate (isotonic) infusion in sterile water 100 mL/hr at 03/31/18 1153   PRN Meds:.acetaminophen, diphenhydrAMINE, lidocaine-prilocaine, LORazepam, prochlorperazine, senna-docusate, sodium chloride flush   Antibiotics   Anti-infectives (From admission, onward)   Start     Dose/Rate Route Frequency Ordered Stop   03/31/18 1130  cefTRIAXone (ROCEPHIN) 1 g in sodium chloride 0.9 % 100 mL IVPB     1 g 200 mL/hr over 30 Minutes Intravenous Every 24 hours  03/31/18 1124          Subjective:   Marcia [name] was seen and examined today.  Feeling better, still has generalized weakness. Patient denies dizziness, chest pain, shortness of breath, abdominal pain, N/V/D/C, new weakness, numbess, tingling. No acute events overnight.  No fevers.  Objective:   Vitals:   03/30/18 1730 03/30/18 1745 03/30/18 1902 03/30/18 2119  BP: 113/70  103/64 (!) 100/57  Pulse: 62 63 60 67  Resp: 16 (!) _0 Temp:   98 F (36.7 C) 97.7 F (36.5 C)  TempSrc:   Oral Oral  SpO2: 100% 99% 100% 100%    Intake/Output Summary (Last 24  hours) at 03/31/2018 1441 Last data filed at 03/31/2018 1153 Gross per 24 hour  Intake 1748.34 ml  Output 1501 ml  Net 247.34 ml     Wt Readings from Last 3 Encounters:  03/30/18 93.2 kg (205 lb 8 oz)  03/09/18 96.2 kg (212 lb)  02/16/18 99.8 kg (220 lb)     Exam  General: Alert and oriented x 3, NAD  Eyes:   HEENT:    Cardiovascular: S1 S2 auscultated,  Regular rate and rhythm.  Respiratory: Clear to auscultation bilaterally, no wheezing, rales or rhonchi  Gastrointestinal: Soft, nontender, nondistended, + bowel sounds  Ext: no pedal edema bilaterally  Neuro: no new deficits  Musculoskeletal: No digital cyanosis, clubbing  Skin: No rashes  Psych: Normal affect and demeanor, alert and oriented x3    Data Reviewed:  I have personally reviewed following labs and imaging studies  Micro Results No results found for this or any previous visit (from the past 240 hour(s)).  Radiology Reports US Renal  Result Date: 03/30/2018 CLINICAL DATA:  67 year old hypertensive female with acute kidney insufficiency. Initial encounter. EXAM: RENAL / URINARY TRACT ULTRASOUND COMPLETE COMPARISON:  02/15/2018 ultrasound. FINDINGS: Right Kidney: Length: 10.5 cm. Mild renal parenchymal thinning. No hydronephrosis. No mass identified. Left Kidney: Length: 10.2 cm. Mild renal parenchymal thinning. No hydronephrosis or mass. Bladder: Appears normal for degree of bladder distention. IMPRESSION: No hydronephrosis. Mild renal parenchymal thinning. Previously questioned 1.3 cm hypoechoic structure within the right kidney is not appreciated on present exam. This may be secondary to habitus and bowel gas. Attention to this on follow-up. Electronically Signed   By: Genia Del M.D.   On: 03/30/2018 19:15    Lab Data:  CBC: Recent Labs  Lab 03/30/18 0825 03/31/18 0431  WBC 26.1* 30.2*  NEUTROABS 22.4*  --   HGB 10.8* 9.4*  HCT 31.1* 28.0*  MCV 94.2 93.0  PLT 228 160   Basic Metabolic  Panel: Recent Labs  Lab 03/30/18 0825 03/31/18 0431  NA 135 136  K 3.4 3.1*  CL 110* 105  CO2 17* 20*  GLUCOSE 147* 107*  BUN 54* 62*  CREATININE 7.70* 6.73*  CALCIUM 8.5 8.0*   GFR: CrCl cannot be calculated (Unknown ideal weight.). Liver Function Tests: Recent Labs  Lab 03/30/18 0825 03/31/18 0431  AST 20 22  ALT 37 25  ALKPHOS 87* 74  BILITOT 0.5 0.5  PROT 6.4 5.2*  ALBUMIN 3.3* 2.8*   No results for input(s): LIPASE, AMYLASE in the last 168 hours. No results for input(s): AMMONIA in the last 168 hours. Coagulation Profile: No results for input(s): INR, PROTIME in the last 168 hours. Cardiac Enzymes: No results for input(s): CKTOTAL, CKMB, CKMBINDEX, TROPONINI in the last 168 hours. BNP (last 3 results) No results for input(s): PROBNP in the last 8760  hours. HbA1C: No results for input(s): HGBA1C in the last 72 hours. CBG: Recent Labs  Lab 03/31/18 0756  GLUCAP 93   Lipid Profile: No results for input(s): CHOL, HDL, LDLCALC, TRIG, CHOLHDL, LDLDIRECT in the last 72 hours. Thyroid Function Tests: No results for input(s): TSH, T4TOTAL, FREET4, T3FREE, THYROIDAB in the last 72 hours. Anemia Panel: No results for input(s): VITAMINB12, FOLATE, FERRITIN, TIBC, IRON, RETICCTPCT in the last 72 hours. Urine analysis:    Component Value Date/Time   COLORURINE BLUE (A) 03/30/2018 1138   APPEARANCEUR CLEAR 03/30/2018 1138   LABSPEC 1.008 03/30/2018 1138   LABSPEC 1.010 05/02/2009 1206   PHURINE 5.0 03/30/2018 1138   GLUCOSEU NEGATIVE 03/30/2018 1138   HGBUR NEGATIVE 03/30/2018 1138   BILIRUBINUR NEGATIVE 03/30/2018 1138   KETONESUR NEGATIVE 03/30/2018 1138   PROTEINUR NEGATIVE 03/30/2018 1138   UROBILINOGEN 0.2 03/24/2009 0039   NITRITE POSITIVE (A) 03/30/2018 1138   LEUKOCYTESUR SMALL (A) 03/30/2018 1138     Ripudeep Rai M.D. [facility] 03/31/2018, 2:41 PM  Pager: [phone] Between 7am to 7pm - call Pager - [phone]  After 7pm go to  [email or web address] - password [email or web address]  Call night coverage person covering after 7pm

## 2018-03-31 NOTE — Progress Notes (Signed)
Initial Nutrition Assessment  DOCUMENTATION CODES:   Obesity unspecified  INTERVENTION:  Ensure Enlive po BID, each supplement provides 350 kcal and 20 grams of protein  NUTRITION DIAGNOSIS:   Inadequate oral intake related to cancer and cancer related treatments as evidenced by per patient/family report.   GOAL:   Patient will meet greater than or equal to 90% of their needs  MONITOR:   PO intake, Supplement acceptance  REASON FOR ASSESSMENT:   Malnutrition Screening Tool    ASSESSMENT:   Marcia [name] is a 67 y.o. 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 She presents with AKI.  Patient seen for positive MST  Spoke with her and her husband Marcia [name] at bedside.  Patient reports decreased appetite every time she starts chemotherapy. She has only been eating clear soups, fruit, and drinking 1-2 bottles of ensure. Also complains of taste alterations with chemo. When she is not on chemo, normally eats 3 meals a day and consumes ensure. UBW of 223 pounds prior to starting chemo, now she is down to 205 pounds an 18 pound/8% severe weight loss over 1.25 months.  She did present with AKI so some of this weight loss is likely related to dehydration. Further, her weight appears to have fluctuated between 213-220 pounds over the past 1.5 years so weight loss may not be as drastic as it seems.  Discussed ways to decrease taste alterations like using sugar free mints or lemon drops prior to meals. She was thankful for this.  She received a tray this morning but did not get to choose what she received, so she ate some fruit, milk, and juice.  Patient would like to receive strawberry ensure here. Normally drinks ensure original, does not like other kinds of ensure due to them being "too sweet." She was open to trying  ensure enlive. Offered premier protein but she does not like chocolate or vanilla flavors (hospital does not carry strawberry premier).  She was supposed to start her 3rd cycle of Docetaxel + Carboplatin + Trastuzumab + Pertuzumab this week but was admitted to the hospital. Now will start 04/14/2018.  Monitor tolerance and PO intake.  Labs reviewed:  K+ 3.1, BUN/Creatinine 62/6.73  Medications reviewed and include:  NaHCO3 at 139m/hr, Heparin gtt  NUTRITION - FOCUSED PHYSICAL EXAM:    Most Recent Value  Orbital Region  No depletion  Upper Arm Region  No depletion  Thoracic and Lumbar Region  No depletion  Buccal Region  No depletion  Temple Region  No depletion  Clavicle Bone Region  No depletion  Clavicle and Acromion Bone Region  No depletion  Scapular Bone Region  No depletion  Dorsal Hand  No depletion  Patellar Region  No depletion  Anterior Thigh Region  No depletion  Posterior Calf Region  No depletion       Diet Order:   Diet Order           Diet regular Room service appropriate? Yes; Fluid consistency: Thin  Diet effective now          EDUCATION NEEDS:   Education needs have been addressed  Skin:  Skin Assessment: Reviewed RN Assessment  Last BM:  03/30/2018  Height:   Ht Readings from Last 1 Encounters:  10/21/16 5' (1.524 m)    Weight:   Wt Readings from Last 1 Encounters:  03/30/18 205 lb 8 oz (  93.2 kg)    Ideal Body Weight:  45.45 kg  BMI:  40.33  Estimated Nutritional Needs:   Kcal:  2100-2500 calories  Protein:  121-140 grams  Fluid:  >2L  Marcia [name]. Marcia Waas, MS, RD LDN Inpatient Clinical Dietitian Pager [phone]

## 2018-03-31 NOTE — Progress Notes (Signed)
Patient refused blood culture to be drawn. Staff explained the importance of these blood work but she refused. Made aware. Will continue to monitor.

## 2018-03-31 NOTE — Progress Notes (Signed)
[facility] Progress Note    Assessment/ Plan:    67 y/o female with infiltrating ductal carcinoma right breast recently diagnosed in Feb 2019  She underwent two cycles of cysplatinum based chemotherapy in April 2019.  Past h/o kappa light chain myeloma with transplant at [facility].  She has chronic renal failure, baseline creatinine 1.4 - 1.6, and has been followed by Dr. Marval Regal.  Creatinine was found to have increased to 7.7 at the [facility].   She has been treated for a urinary tract infection with Bactrim since Thursday 5/2.    1. Acute renal failure - slight improvement in SCr 7.70>6.73. Suspect related to the use of Bactrim, ?interstitial inflammation. Could also be a direct affect of the medication that may be interfering with excretion of creatinine.  Renal US without evidence of hydronephrosis. No episodes of hypotension that could have lead to a decrease in renal perfusion. There is no evidence of proteinuria noted and no red blood cells making a glomerulonephritis unlikely. This also would make amyloid or light chain disease not a likely cause.    2. HTN - controlled    3. Anemia, Hgb 9.4 and stable. Continue to follow.    4. Metabolic acidosis with bicarb 20, may continue IV bicarbonate 100 cc/hr.   Subjective:   No current complaints.  Denies SOB, CP, leg swelling. No acute overnight events.      Objective:   BP (!) 100/57 (BP Location: Right Arm)   Pulse 67   Temp 97.7 F (36.5 C) (Oral)   Resp 18   SpO2 100%   Intake/Output Summary (Last 24 hours) at 03/31/2018 1340 Last data filed at 03/31/2018 1153 Gross per 24 hour  Intake 1748.34 ml  Output 1501 ml  Net 247.34 ml   Weight change:   Physical Exam: General: 67 yo F, ill appearing, NAD  Head:  NCAT  Eyes:  EOMI, no scleral icterus  Neck:  Supple; no JVD  Lungs:  CTAB, normal effort  Heart:  RRR no MRG  Abdomen:  Soft, NTND, no mass, no rebound or guarding, +bs  Pulses:  No carotid,  renal, femoral bruits. DP and PT symmetrical and equal Extremities:  No edema or tenderness.  Neurologic:  Alert and oriented x4;  grossly normal neurologically. Skin:  Intact without significant lesions or rashes.  Imaging: US Renal  Result Date: 03/30/2018 CLINICAL DATA:  67 year old hypertensive female with acute kidney insufficiency. Initial encounter. EXAM: RENAL / URINARY TRACT ULTRASOUND COMPLETE COMPARISON:  02/15/2018 ultrasound. FINDINGS: Right Kidney: Length: 10.5 cm. Mild renal parenchymal thinning. No hydronephrosis. No mass identified. Left Kidney: Length: 10.2 cm. Mild renal parenchymal thinning. No hydronephrosis or mass. Bladder: Appears normal for degree of bladder distention. IMPRESSION: No hydronephrosis. Mild renal parenchymal thinning. Previously questioned 1.3 cm hypoechoic structure within the right kidney is not appreciated on present exam. This may be secondary to habitus and bowel gas. Attention to this on follow-up. Electronically Signed   By: Genia Del M.D.   On: 03/30/2018 19:15    Labs: BMET Recent Labs  Lab 03/30/18 0825 03/31/18 0431  NA 135 136  K 3.4 3.1*  CL 110* 105  CO2 17* 20*  GLUCOSE 147* 107*  BUN 54* 62*  CREATININE 7.70* 6.73*  CALCIUM 8.5 8.0*   CBC Recent Labs  Lab 03/30/18 0825 03/31/18 0431  WBC 26.1* 30.2*  NEUTROABS 22.4*  --   HGB 10.8* 9.4*  HCT 31.1* 28.0*  MCV 94.2 93.0  PLT  228 207    Medications:    . heparin  5,000 Units Subcutaneous Q8H    Lovenia Kim MD  [facility], PGY-2

## 2018-04-01 ENCOUNTER — Inpatient Hospital Stay: Payer: 59

## 2018-04-01 LAB — CBC
HCT: 28 % — ABNORMAL LOW (ref 36.0–46.0)
HEMOGLOBIN: 9.3 g/dL — AB (ref 12.0–15.0)
MCH: 31.3 pg (ref 26.0–34.0)
MCHC: 33.2 g/dL (ref 30.0–36.0)
MCV: 94.3 fL (ref 78.0–100.0)
PLATELETS: 192 10*3/uL (ref 150–400)
RBC: 2.97 MIL/uL — ABNORMAL LOW (ref 3.87–5.11)
RDW: 16.4 % — AB (ref 11.5–15.5)
WBC: 15.2 10*3/uL — ABNORMAL HIGH (ref 4.0–10.5)

## 2018-04-01 LAB — PROTEIN ELECTROPHORESIS, SERUM
A/G RATIO SPE: 1.4 (ref 0.7–1.7)
ALPHA-2-GLOBULIN: 0.7 g/dL (ref 0.4–1.0)
Albumin ELP: 3 g/dL (ref 2.9–4.4)
Alpha-1-Globulin: 0.2 g/dL (ref 0.0–0.4)
BETA GLOBULIN: 0.8 g/dL (ref 0.7–1.3)
GAMMA GLOBULIN: 0.6 g/dL (ref 0.4–1.8)
GLOBULIN, TOTAL: 2.2 g/dL (ref 2.2–3.9)
Total Protein ELP: 5.2 g/dL — ABNORMAL LOW (ref 6.0–8.5)

## 2018-04-01 LAB — BASIC METABOLIC PANEL
Anion gap: 11 (ref 5–15)
BUN: 57 mg/dL — AB (ref 6–20)
CALCIUM: 7.9 mg/dL — AB (ref 8.9–10.3)
CHLORIDE: 101 mmol/L (ref 101–111)
CO2: 29 mmol/L (ref 22–32)
CREATININE: 5.6 mg/dL — AB (ref 0.44–1.00)
GFR calc non Af Amer: 7 mL/min — ABNORMAL LOW (ref >60)
GFR, EST AFRICAN AMERICAN: 8 mL/min — AB (ref >60)
Glucose, Bld: 92 mg/dL (ref 65–99)
Potassium: 3.2 mmol/L — ABNORMAL LOW (ref 3.5–5.1)
SODIUM: 141 mmol/L (ref 135–145)

## 2018-04-01 LAB — GLUCOSE, CAPILLARY: GLUCOSE-CAPILLARY: 104 mg/dL — AB (ref 65–99)

## 2018-04-01 MED ORDER — ENSURE ENLIVE PO LIQD
237.0000 mL | Freq: Two times a day (BID) | ORAL | Status: DC
  Administered 2018-04-01 – 2018-04-02 (×3): 237 mL via ORAL

## 2018-04-01 MED ORDER — POTASSIUM CHLORIDE CRYS ER 20 MEQ PO TBCR
40.0000 meq | EXTENDED_RELEASE_TABLET | Freq: Once | ORAL | Status: AC
  Administered 2018-04-01: 40 meq via ORAL
  Filled 2018-04-01: qty 2

## 2018-04-01 NOTE — Progress Notes (Signed)
  [facility] Progress Note    Assessment/ Plan:    1. Acute renal failure - slight improvement in SCr 6.73>5.60. Suspect related to the use of Bactrim, ?interstitial inflammation.    2. HTN - controlled    3. Anemia, Hgb 9.3 and stable. Continue to follow.    4. Metabolic acidosis: bicarb improved to 29   Nephrology signing off at this time, would be happy to be reconsulted if needed.  Recommend close monitoring to ensure SCr continues to downtrend.    Subjective:   Afebrile.   Blood pressures improved.  SCr improving, no complaints this morning.     Objective:   BP 107/63 (BP Location: Right Arm)   Pulse 89   Temp 98 F (36.7 C) (Oral)   Resp 17   SpO2 95%   Intake/Output Summary (Last 24 hours) at 04/01/2018 1124 Last data filed at 04/01/2018 1112 Gross per 24 hour  Intake 1733.34 ml  Output 850 ml  Net 883.34 ml   Physical Exam: General:66 yo F,ill appearing, NAD  Head: NCAT  Eyes: EOMI, no scleral icterus  Neck: Supple; no JVD  Lungs: CTAB, normal effort  Heart: RRR no MRG  Abdomen: Soft, NTND, +bs  Pulses: No bruits, 2+ pedal pulses  Extremities: No edema or tenderness.  Neurologic: Alert, ox4, no focal deficits  Skin: warm, dry, no rash  Imaging: US Renal  Result Date: 03/30/2018 CLINICAL DATA:  67 year old hypertensive female with acute kidney insufficiency. Initial encounter. EXAM: RENAL / URINARY TRACT ULTRASOUND COMPLETE COMPARISON:  02/15/2018 ultrasound. FINDINGS: Right Kidney: Length: 10.5 cm. Mild renal parenchymal thinning. No hydronephrosis. No mass identified. Left Kidney: Length: 10.2 cm. Mild renal parenchymal thinning. No hydronephrosis or mass. Bladder: Appears normal for degree of bladder distention. IMPRESSION: No hydronephrosis. Mild renal parenchymal thinning. Previously questioned 1.3 cm hypoechoic structure within the right kidney is not appreciated on present exam. This may be secondary to habitus and bowel gas.  Attention to this on follow-up. Electronically Signed   By: Genia Del M.D.   On: 03/30/2018 19:15    Labs: BMET Recent Labs  Lab 03/30/18 0825 03/31/18 0431 04/01/18 0500  NA 135 136 141  K 3.4 3.1* 3.2*  CL 110* 105 101  CO2 17* 20* 29  GLUCOSE 147* 107* 92  BUN 54* 62* 57*  CREATININE 7.70* 6.73* 5.60*  CALCIUM 8.5 8.0* 7.9*   CBC Recent Labs  Lab 03/30/18 0825 03/31/18 0431 04/01/18 0500  WBC 26.1* 30.2* 15.2*  NEUTROABS 22.4*  --   --   HGB 10.8* 9.4* 9.3*  HCT 31.1* 28.0* 28.0*  MCV 94.2 93.0 94.3  PLT 228 207 192    Medications:    . feeding supplement (ENSURE ENLIVE)  237 mL Oral BID BM  . heparin  5,000 Units Subcutaneous Q8H    Lovenia Kim, MD [place] PGY-2

## 2018-04-01 NOTE — Progress Notes (Signed)
Marcia [name] is feeling better.  I am so happy for her.  Her kidney function is improving quite nicely.  Her creatinine is now down to 5.6.  As expected, she refused blood cultures.  She really hates to be stuck with needles.  She just cannot get over the image of needles being "jammed into me."  Her white cell count also has improved quite nicely.  I would suspect that she has a urine infection.  The urine culture is not back yet.  She is eating a little bit better.  There is no nausea or vomiting.  She is able to get out of bed now.  She wants to walk more.  She has had no fever.  She has had no bleeding.  She has had no diarrhea.  On her physical exam, her blood pressure is doing better.  It is now 107/63.  She is afebrile.  Her pulse is 89.  I do not think she needs to be on a cardiac monitor.  She has been in normal sinus rhythm.  She really does not have electrolyte abnormalities.  I think it would help her mentally if the cardiac monitor was discontinued.  Hopefully, if her creatinine continues to improve, she will be able to go home tomorrow.  She would really love this.  I think she is able to get enough hydration in.  He will be interesting to see what the urine culture grows.  She is had E. coli in the past.  I appreciate the great care that she is getting from the staff up on [address].  [name], MD  John 10:11

## 2018-04-01 NOTE — Progress Notes (Signed)
[facility]                                                                              Patient Demographics  Marcia [name], is a 67 y.o. female, DOB - 07/03/1951, JQB:[id number]  Admit date - 03/30/2018   Admitting Physician Desiree Hane, MD  Outpatient Primary MD for the patient is Jilda Panda, MD  Outpatient specialists:   LOS - 2  days   Medical records reviewed and are as summarized below:    Chief Complaint  Patient presents with  . Acute Renal Failure       Brief summary   Marcia [name] is a 67 y.o. 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  She states that she was started on Bactrim by her primary care doctor for UTI this past Friday and has been on Bactrim up until morning of this admission.  On she was seen by her primary oncologist, Dr. [name], found to have elevated creatinine routine BMP.  She notes decreased appetite in general, otherwise no acute complaints.  Assessment & Plan    Principal Problem:   Acute renal failure (ARF) ([place]): Baseline creatinine 1.3-1.6 -Presented with creatinine of 7.7 likely due to Bactrim, interstitial inflammation and UTI. - renal ultrasound did not show hydronephrosis or obstruction -Improving, creatinine 5.6 today, IV fluids discontinued  Active Problems:   Essential hypertension -Currently controlled     Multiple myeloma in remission [facility]), primary right breast CA -Follows Dr. [name]    Acute lower UTI -Obtained requested urine culture results, E. coli, resistant to Bactrim -Urine culture 5/8 inpatient also shows  more than 100,000 colonies of E. coli.   -Continue IV Rocephin   Metabolic acidosis -Resolved with bicarb IV   Hypokalemia -Potassium 3.2, replaced  Leukocytosis -Possibly due to UTI versus Neulasta, currently undergoing chemotherapy -Treat UTI, blood cultures negative so far, WBC count improving  Code Status: Full CODE STATUS DVT Prophylaxis:  SCD's Family Communication: Discussed in detail with the patient, all imaging results, lab results explained to the patient and husband at the bedside   Disposition Plan: Possible DC tomorrow if creatinine continues to improve  Time Spent in minutes 25 minutes  Procedures:  Renal ultrasound  Consultants:   Nephrology  Antimicrobials:  IV Rocephin 5/8  Medications  Scheduled Meds: . feeding supplement (ENSURE ENLIVE)  237 mL Oral BID BM  . heparin  5,000 Units Subcutaneous Q8H   Continuous Infusions: . cefTRIAXone (ROCEPHIN)  IV Stopped (04/01/18 1142)   PRN Meds:.acetaminophen, diphenhydrAMINE, lidocaine-prilocaine, LORazepam, prochlorperazine, senna-docusate, sodium chloride flush   Antibiotics   Anti-infectives (From admission, onward)   Start     Dose/Rate Route Frequency Ordered Stop   03/31/18 1130  cefTRIAXone (ROCEPHIN) 1 g in sodium chloride 0.9 % 100 mL IVPB     1 g 200 mL/hr over 30  Minutes Intravenous Every 24 hours 03/31/18 1124          Subjective:   Alannah Averhart was seen and examined today.  Feeling better, no fevers or chills, no acute complaints.  Patient denies dizziness, chest pain, shortness of breath, abdominal pain, N/V/D/C, new weakness, numbess, tingling. No acute events overnight. Objective:   Vitals:   03/30/18 2119 03/31/18 1451 03/31/18 2252 04/01/18 0539  BP: (!) 100/57 (!) 99/58 (!) 102/59 107/63  Pulse: 67 92 80 89  Resp: _0 Temp: 97.7 F (36.5 C) 98.2 F (36.8 C) 97.7 F (36.5 C) 98 F (36.7 C)  TempSrc: Oral Oral Oral Oral  SpO2: 100% 99% 98% 95%     Intake/Output Summary (Last 24 hours) at 04/01/2018 1527 Last data filed at 04/01/2018 1412 Gross per 24 hour  Intake 1386.67 ml  Output 850 ml  Net 536.67 ml     Wt Readings from Last 3 Encounters:  03/30/18 93.2 kg (205 lb 8 oz)  03/09/18 96.2 kg (212 lb)  02/16/18 99.8 kg (220 lb)     Exam   General: Alert and oriented x 3, NAD  Eyes:  HEENT:    Cardiovascular: S1 S2 auscultated, Regular rate and rhythm. No pedal edema b/l  Respiratory: Clear to auscultation bilaterally, no wheezing, rales or rhonchi  Gastrointestinal: Soft, nontender, nondistended, + bowel sounds  Ext: no pedal edema bilaterally  Neuro: no new deficits  Musculoskeletal: No digital cyanosis, clubbing  Skin: No rashes  Psych: Normal affect and demeanor, alert and oriented x3    Data Reviewed:  I have personally reviewed following labs and imaging studies  Micro Results Recent Results (from the past 240 hour(s))  Urine culture     Status: Abnormal (Preliminary result)   Collection Time: 03/31/18  1:00 AM  Result Value Ref Range Status   Specimen Description URINE, RANDOM  Final   Special Requests   Final    NONE Performed at [facility], [address], [place], [place] [address]    Culture >=100,000 COLONIES/mL ESCHERICHIA COLI (A)  Final   Report Status PENDING  Incomplete    Radiology Reports US Renal  Result Date: 03/30/2018 CLINICAL DATA:  67 year old hypertensive female with acute kidney insufficiency. Initial encounter. EXAM: RENAL / URINARY TRACT ULTRASOUND COMPLETE COMPARISON:  02/15/2018 ultrasound. FINDINGS: Right Kidney: Length: 10.5 cm. Mild renal parenchymal thinning. No hydronephrosis. No mass identified. Left Kidney: Length: 10.2 cm. Mild renal parenchymal thinning. No hydronephrosis or mass. Bladder: Appears normal for degree of bladder distention. IMPRESSION: No hydronephrosis. Mild renal parenchymal thinning. Previously questioned 1.3 cm hypoechoic structure  within the right kidney is not appreciated on present exam. This may be secondary to habitus and bowel gas. Attention to this on follow-up. Electronically Signed   By: Genia Del M.D.   On: 03/30/2018 19:15    Lab Data:  CBC: Recent Labs  Lab 03/30/18 0825 03/31/18 0431 04/01/18 0500  WBC 26.1* 30.2* 15.2*  NEUTROABS 22.4*  --   --   HGB 10.8* 9.4* 9.3*  HCT 31.1* 28.0* 28.0*  MCV 94.2 93.0 94.3  PLT 228 207 497   Basic Metabolic Panel: Recent Labs  Lab 03/30/18 0825 03/31/18 0431 04/01/18 0500  NA 135 136 141  K 3.4 3.1* 3.2*  CL 110* 105 101  CO2 17* 20* 29  GLUCOSE 147* 107* 92  BUN 54* 62* 57*  CREATININE 7.70* 6.73* 5.60*  CALCIUM 8.5 8.0* 7.9*   GFR: CrCl  cannot be calculated (Unknown ideal weight.). Liver Function Tests: Recent Labs  Lab 03/30/18 0825 03/31/18 0431  AST 20 22  ALT 37 25  ALKPHOS 87* 74  BILITOT 0.5 0.5  PROT 6.4 5.2*  ALBUMIN 3.3* 2.8*   No results for input(s): LIPASE, AMYLASE in the last 168 hours. No results for input(s): AMMONIA in the last 168 hours. Coagulation Profile: No results for input(s): INR, PROTIME in the last 168 hours. Cardiac Enzymes: No results for input(s): CKTOTAL, CKMB, CKMBINDEX, TROPONINI in the last 168 hours. BNP (last 3 results) No results for input(s): PROBNP in the last 8760 hours. HbA1C: No results for input(s): HGBA1C in the last 72 hours. CBG: Recent Labs  Lab 03/31/18 0756 04/01/18 0814  GLUCAP 93 104*   Lipid Profile: No results for input(s): CHOL, HDL, LDLCALC, TRIG, CHOLHDL, LDLDIRECT in the last 72 hours. Thyroid Function Tests: No results for input(s): TSH, T4TOTAL, FREET4, T3FREE, THYROIDAB in the last 72 hours. Anemia Panel: No results for input(s): VITAMINB12, FOLATE, FERRITIN, TIBC, IRON, RETICCTPCT in the last 72 hours. Urine analysis:    Component Value Date/Time   COLORURINE BLUE (A) 03/30/2018 1138   APPEARANCEUR CLEAR 03/30/2018 1138   LABSPEC 1.008 03/30/2018 1138    LABSPEC 1.010 05/02/2009 1206   PHURINE 5.0 03/30/2018 1138   GLUCOSEU NEGATIVE 03/30/2018 1138   HGBUR NEGATIVE 03/30/2018 1138   BILIRUBINUR NEGATIVE 03/30/2018 1138   KETONESUR NEGATIVE 03/30/2018 1138   PROTEINUR NEGATIVE 03/30/2018 1138   UROBILINOGEN 0.2 03/24/2009 0039   NITRITE POSITIVE (A) 03/30/2018 1138   LEUKOCYTESUR SMALL (A) 03/30/2018 1138     [place] M.D. [facility] 04/01/2018, 3:27 PM  Pager: [phone] Between 7am to 7pm - call Pager - [phone]  After 7pm go to [email or web address] - password [email or web address]  Call night coverage person covering after 7pm

## 2018-04-02 LAB — URINE CULTURE

## 2018-04-02 LAB — CBC
HEMATOCRIT: 27.3 % — AB (ref 36.0–46.0)
HEMOGLOBIN: 9 g/dL — AB (ref 12.0–15.0)
MCH: 31.7 pg (ref 26.0–34.0)
MCHC: 33 g/dL (ref 30.0–36.0)
MCV: 96.1 fL (ref 78.0–100.0)
Platelets: 184 10*3/uL (ref 150–400)
RBC: 2.84 MIL/uL — AB (ref 3.87–5.11)
RDW: 16.5 % — ABNORMAL HIGH (ref 11.5–15.5)
WBC: 13.5 10*3/uL — ABNORMAL HIGH (ref 4.0–10.5)

## 2018-04-02 LAB — BASIC METABOLIC PANEL
Anion gap: 11 (ref 5–15)
BUN: 46 mg/dL — ABNORMAL HIGH (ref 6–20)
CHLORIDE: 102 mmol/L (ref 101–111)
CO2: 25 mmol/L (ref 22–32)
CREATININE: 4.54 mg/dL — AB (ref 0.44–1.00)
Calcium: 8.2 mg/dL — ABNORMAL LOW (ref 8.9–10.3)
GFR calc non Af Amer: 9 mL/min — ABNORMAL LOW (ref >60)
GFR, EST AFRICAN AMERICAN: 11 mL/min — AB (ref >60)
Glucose, Bld: 96 mg/dL (ref 65–99)
POTASSIUM: 3.6 mmol/L (ref 3.5–5.1)
Sodium: 138 mmol/L (ref 135–145)

## 2018-04-02 LAB — GLUCOSE, CAPILLARY: GLUCOSE-CAPILLARY: 108 mg/dL — AB (ref 65–99)

## 2018-04-02 MED ORDER — HEPARIN SOD (PORK) LOCK FLUSH 100 UNIT/ML IV SOLN
500.0000 [IU] | INTRAVENOUS | Status: AC | PRN
  Administered 2018-04-02: 500 [IU]

## 2018-04-02 MED ORDER — CEFDINIR 300 MG PO CAPS: 300.0000 mg | ORAL_CAPSULE | Freq: Every day | ORAL | 0 refills | Status: AC

## 2018-04-02 NOTE — Progress Notes (Signed)
Loistine Chance Radcliffe to be D/C'd Home per MD order.  Discussed prescriptions and follow up appointments with the patient. Prescriptions given to patient, medication list explained in detail. Pt verbalized understanding.  Allergies as of 04/02/2018      Reactions   Zofran [ondansetron] Shortness Of Breath   "felt like throat was closing"   Clarithromycin Diarrhea, Nausea And Vomiting   Macrodantin [nitrofurantoin] Rash      Medication List    TAKE these medications   acetaminophen 500 MG tablet Commonly known as:  TYLENOL Take 500 mg by mouth every 6 (six) hours as needed for mild pain.   cefdinir 300 MG capsule Commonly known as:  OMNICEF Take 1 capsule (300 mg total) by mouth daily for 4 days. Start taking on:  04/03/2018   doxylamine (Sleep) 25 MG tablet Commonly known as:  UNISOM Take 25 mg by mouth at bedtime as needed for sleep.   lidocaine-prilocaine cream Commonly known as:  EMLA Apply to affected area once   LORazepam 0.5 MG tablet Commonly known as:  ATIVAN TAKE 1 TABLET BY MOUTH EVERY 6 HOURS AS NEEDED FOR NAUSEA/VOMITING   prochlorperazine 10 MG tablet Commonly known as:  COMPAZINE TAKE 1 TABLET (10 MG TOTAL) BY MOUTH EVERY 6 (SIX) HOURS AS NEEDED (NAUSEA OR VOMITING).       Vitals:   04/01/18 2056 04/02/18 0507  BP: (!) 127/58 111/63  Pulse: 92 76  Resp: 18 18  Temp: 98.1 F (36.7 C)   SpO2: 97% 99%    Skin clean, dry and intact without evidence of skin break down, no evidence of skin tears noted. IV catheter discontinued intact. Site without signs and symptoms of complications. Dressing and pressure applied. Pt denies pain at this time. No complaints noted.  An After Visit Summary was printed and given to the patient. Patient escorted via [place], and D/C home via private auto.  Chapman Fitch BSN, RN [facility] [place] Phone [phone]

## 2018-04-02 NOTE — Progress Notes (Signed)
Porta cath deaccessed. Flushed with 10cc NS followed by Heparin 5ml (100u/ml). No bleeding to site, band aid to site for comfort. Marcia [name] M 

## 2018-04-02 NOTE — Progress Notes (Signed)
Marcia [name] looks good.  She keeps feeling better.  Her creatinine yesterday was 5.6.  The creatinine today is not back.  Hopefully, it will be lower.  If so, I would like to hope that she could go home.  She is on no fluids.  She gets Rocephin.  She grew E. coli again in her urine.  Hopefully, it will be pansensitive and she can go home on oral agents.  Her hemoglobin is 9.  I think this probably is low because of her renal failure.  I do not think we need to give her Aranesp.  She is walking.  She is eating.  She is drinking fluids okay.  She has had no fever.  She is had no bleeding.  She is had no rashes.  She really wants to get back onto treatment.  She has responded clinically to date.  There will be nice to get a MRI of the right breast but her renal function just is not good enough that she could get the contrast that is needed.  There really is no change in her physical exam.  Her blood pressure is 111/63.  Her temperature is 98.1.  Pulse 76.  Her lungs are clear.  Cardiac exam regular rate and rhythm.  Abdomen is soft.  She is mildly obese.  She has no guarding or rebound tenderness.  There is no palpable liver or spleen tip.  Extremities shows some slight trace edema in her right leg.  Looks like Marcia [name] has renal failure that is transient.  Hopefully this is from Bactrim.  May also be from the E. coli that is in her urine.  She really would like to go home today.  From a hematologic point of view, I do not see a reason why she cannot go home.  I will get her back to the office in 2 weeks to resume treatments.  [name], MD  [place] 7:7-8

## 2018-04-02 NOTE — Discharge Summary (Addendum)
Physician Discharge Summary   Patient ID: Marcia [name] MRN: [id number] DOB/AGE: 1950/11/28 67 y.o.  Admit date: 03/30/2018 Discharge date: 04/02/2018  Primary Care Physician:  Jilda Panda, MD   Recommendations for Outpatient Follow-up:  1. Follow up with PCP in 1-2 weeks 2. Recheck BMET early next week  Home Health: None  Equipment/Devices:   Discharge Condition: stable CODE STATUS: FULL Diet recommendation: Heart healthy diet   Discharge Diagnoses:    . Acute renal failure (ARF) ([facility]) possibly due to Bactrim, UTI . Primary cancer of right breast without evidence of regional lymph node metastasis (N0) ([place]) . Essential hypertension . Multiple myeloma in remission ([place]) . E. coli UTI   Consults:   Nephrology Oncology    Allergies:   Allergies  Allergen Reactions  . Zofran [Ondansetron] Shortness Of Breath    "felt like throat was closing"  . Clarithromycin Diarrhea and Nausea And Vomiting  . Macrodantin [Nitrofurantoin] Rash     DISCHARGE MEDICATIONS: Allergies as of 04/02/2018      Reactions   Zofran [ondansetron] Shortness Of Breath   "felt like throat was closing"   Clarithromycin Diarrhea, Nausea And Vomiting   Macrodantin [nitrofurantoin] Rash      Medication List    TAKE these medications   acetaminophen 500 MG tablet Commonly known as:  TYLENOL Take 500 mg by mouth every 6 (six) hours as needed for mild pain.   cefdinir 300 MG capsule Commonly known as:  OMNICEF Take 1 capsule (300 mg total) by mouth daily for 4 days. Start taking on:  04/03/2018   doxylamine (Sleep) 25 MG tablet Commonly known as:  UNISOM Take 25 mg by mouth at bedtime as needed for sleep.   lidocaine-prilocaine cream Commonly known as:  EMLA Apply to affected area once   LORazepam 0.5 MG tablet Commonly known as:  ATIVAN TAKE 1 TABLET BY MOUTH EVERY 6 HOURS AS NEEDED FOR NAUSEA/VOMITING   prochlorperazine 10 MG tablet Commonly known as:  COMPAZINE TAKE 1  TABLET (10 MG TOTAL) BY MOUTH EVERY 6 (SIX) HOURS AS NEEDED (NAUSEA OR VOMITING).        Brief H and P: For complete details please refer to admission H and P, but in brief Marcia C McClaryis a 67 y.o.femalewith medical history significant forlocally advanced infiltrating ductal carcinoma of the right breast, currently undergoing chemotherapy (completed second cycle), hypertension, multiple myeloma in remissionwho presents on 5/7/2019with abnormal labs found during oncology outpatient office visit with creatinine of 7.7 (baseline 1-1 0.3.)Patient has undergone 3 cycles of chemotherapy without any concerning symptoms. She states that she was started on Bactrim by her primary care doctor for UTI this past Friday and has been on Bactrim up until morning of this admission. On she was seen by her primary oncologist, Dr. [name], found to have elevated creatinine routine BMP. She notes decreased appetite in general, otherwise no acute complaints.  Hospital Course:  Acute renal failure (ARF) ([place]): Baseline creatinine 1.3-1.6 -Presented with creatinine of 7.7 likely due to Bactrim, interstitial inflammation and UTI. - renal ultrasound did not show hydronephrosis or obstruction -Creatinine function continue to improve, nephrology was consulted, recommended IV fluids -IV fluids were discontinued on 5/9, creatinine improved to 4.5 today -Okay to DC home, will need repeat be met early next week, explained to the patient    Essential hypertension -Controlled, not on any antihypertensives    Multiple myeloma in remission [facility]), primary right breast CA -Follows Dr. [name]    Acute lower E. coli  UTI - Obtained requested urine culture results, E. coli, resistant to Bactrim - Urine culture 5/8 inpatient also shows more than 100,000 colonies of E. coli.   -Patient received 3 doses of IV Rocephin while inpatient, per sensitivities, will discharge on 4 more days of oral  cefdinir.   Metabolic acidosis -Resolved with bicarb IV   Hypokalemia -Potassium 3.2, was replaced orally.    Leukocytosis -WBC count 30.2 at the time of admission, possibly due to UTI versus Neulasta, currently undergoing chemotherapy -WBC count 13.5 at the time of discharge.    Day of Discharge S: Eager to go home, no complaints at this time.  Feeling better, no dysuria or fevers.  BP 111/63 (BP Location: Right Arm)   Pulse 76   Temp 98.1 F (36.7 C)   Resp 18   Wt 94.8 kg (208 lb 15.9 oz)   SpO2 99%   BMI 40.82 kg/m   Physical Exam: General: Alert and awake oriented x3 not in any acute distress. HEENT: anicteric sclera, pupils reactive to light and accommodation CVS: S1-S2 clear no murmur rubs or gallops Chest: clear to auscultation bilaterally, no wheezing rales or rhonchi Abdomen: soft nontender, nondistended, normal bowel sounds Extremities: no cyanosis, clubbing or edema noted bilaterally Neuro: Cranial nerves II-XII intact, no focal neurological deficits   The results of significant diagnostics from this hospitalization (including imaging, microbiology, ancillary and laboratory) are listed below for reference.      Procedures/Studies:  US Renal  Result Date: 04-10-2018 CLINICAL DATA:  67 year old hypertensive female with acute kidney insufficiency. Initial encounter. EXAM: RENAL / URINARY TRACT ULTRASOUND COMPLETE COMPARISON:  02/15/2018 ultrasound. FINDINGS: Right Kidney: Length: 10.5 cm. Mild renal parenchymal thinning. No hydronephrosis. No mass identified. Left Kidney: Length: 10.2 cm. Mild renal parenchymal thinning. No hydronephrosis or mass. Bladder: Appears normal for degree of bladder distention. IMPRESSION: No hydronephrosis. Mild renal parenchymal thinning. Previously questioned 1.3 cm hypoechoic structure within the right kidney is not appreciated on present exam. This may be secondary to habitus and bowel gas. Attention to this on follow-up.  Electronically Signed   By: Genia Del M.D.   On: Apr 10, 2018 19:15       LAB RESULTS: Basic Metabolic Panel: Recent Labs  Lab 04/01/18 0500 04/02/18 0643  NA 141 138  K 3.2* 3.6  CL 101 102  CO2 29 25  GLUCOSE 92 96  BUN 57* 46*  CREATININE 5.60* 4.54*  CALCIUM 7.9* 8.2*   Liver Function Tests: Recent Labs  Lab 04/10/2018 0825 03/31/18 0431  AST 20 22  ALT 37 25  ALKPHOS 87* 74  BILITOT 0.5 0.5  PROT 6.4 5.2*  ALBUMIN 3.3* 2.8*   No results for input(s): LIPASE, AMYLASE in the last 168 hours. No results for input(s): AMMONIA in the last 168 hours. CBC: Recent Labs  Lab 2018-04-10 0825  04/01/18 0500 04/02/18 0417  WBC 26.1*   < > 15.2* 13.5*  NEUTROABS 22.4*  --   --   --   HGB 10.8*   < > 9.3* 9.0*  HCT 31.1*   < > 28.0* 27.3*  MCV 94.2   < > 94.3 96.1  PLT 228   < > 192 184   < > = values in this interval not displayed.   Cardiac Enzymes: No results for input(s): CKTOTAL, CKMB, CKMBINDEX, TROPONINI in the last 168 hours. BNP: Invalid input(s): POCBNP CBG: Recent Labs  Lab 04/01/18 0814 04/02/18 0846  GLUCAP 104* 108*      Disposition and  Follow-up: Discharge Instructions    Diet - low sodium heart healthy   Complete by:  As directed    Increase activity slowly   Complete by:  As directed        DISPOSITION: [place]    Jilda Panda, MD. Schedule an appointment as soon as possible for a visit in 2 week(s).   Specialty:  Internal Medicine Contact information: [address] [place] [address] [phone]        [name], MD Follow up.   Specialty:  Oncology Why:  please get BMET for kidney function checked on Monday or Tuesday  Contact information: [address] [place]  [address] [phone]            Time coordinating discharge:  35 minutes  Signed:   Estill Cotta M.D. [facility] 04/02/2018, 11:56 AM Pager: [phone]    Coding query  addendum  BMI 54, morbid obesity    Waseem Suess M.D. [facility] 04/06/2018, 3:20 PM  Pager: [phone]

## 2018-04-14 ENCOUNTER — Other Ambulatory Visit: Payer: Self-pay

## 2018-04-14 ENCOUNTER — Inpatient Hospital Stay (HOSPITAL_BASED_OUTPATIENT_CLINIC_OR_DEPARTMENT_OTHER): Payer: 59 | Admitting: Hematology & Oncology

## 2018-04-14 ENCOUNTER — Encounter: Payer: Self-pay | Admitting: Hematology & Oncology

## 2018-04-14 ENCOUNTER — Inpatient Hospital Stay: Payer: 59

## 2018-04-14 VITALS — BP 142/70 | HR 76 | Temp 98.2°F | Resp 20 | Wt 207.0 lb

## 2018-04-14 DIAGNOSIS — Z9484 Stem cells transplant status: Secondary | ICD-10-CM

## 2018-04-14 DIAGNOSIS — Z792 Long term (current) use of antibiotics: Secondary | ICD-10-CM | POA: Diagnosis not present

## 2018-04-14 DIAGNOSIS — Z171 Estrogen receptor negative status [ER-]: Secondary | ICD-10-CM

## 2018-04-14 DIAGNOSIS — N390 Urinary tract infection, site not specified: Secondary | ICD-10-CM | POA: Diagnosis not present

## 2018-04-14 DIAGNOSIS — Z9221 Personal history of antineoplastic chemotherapy: Secondary | ICD-10-CM | POA: Diagnosis not present

## 2018-04-14 DIAGNOSIS — C50411 Malignant neoplasm of upper-outer quadrant of right female breast: Secondary | ICD-10-CM | POA: Diagnosis not present

## 2018-04-14 DIAGNOSIS — C9001 Multiple myeloma in remission: Secondary | ICD-10-CM | POA: Diagnosis not present

## 2018-04-14 DIAGNOSIS — C50911 Malignant neoplasm of unspecified site of right female breast: Secondary | ICD-10-CM

## 2018-04-14 DIAGNOSIS — N19 Unspecified kidney failure: Secondary | ICD-10-CM | POA: Insufficient documentation

## 2018-04-14 DIAGNOSIS — Z5111 Encounter for antineoplastic chemotherapy: Secondary | ICD-10-CM | POA: Insufficient documentation

## 2018-04-14 DIAGNOSIS — Z5112 Encounter for antineoplastic immunotherapy: Secondary | ICD-10-CM | POA: Insufficient documentation

## 2018-04-14 DIAGNOSIS — Z79899 Other long term (current) drug therapy: Secondary | ICD-10-CM | POA: Insufficient documentation

## 2018-04-14 DIAGNOSIS — Z7689 Persons encountering health services in other specified circumstances: Secondary | ICD-10-CM | POA: Diagnosis not present

## 2018-04-14 LAB — CMP (CANCER CENTER ONLY)
ALT: 35 U/L (ref 10–47)
ANION GAP: 10 (ref 5–15)
AST: 25 U/L (ref 11–38)
Albumin: 3.6 g/dL (ref 3.5–5.0)
Alkaline Phosphatase: 63 U/L (ref 26–84)
BUN: 25 mg/dL — ABNORMAL HIGH (ref 7–22)
CALCIUM: 9.2 mg/dL (ref 8.0–10.3)
CO2: 24 mmol/L (ref 18–33)
Chloride: 108 mmol/L (ref 98–108)
Creatinine: 1.7 mg/dL — ABNORMAL HIGH (ref 0.60–1.20)
GLUCOSE: 126 mg/dL — AB (ref 73–118)
Potassium: 3.7 mmol/L (ref 3.3–4.7)
Sodium: 142 mmol/L (ref 128–145)
Total Bilirubin: 0.7 mg/dL (ref 0.2–1.6)
Total Protein: 6.5 g/dL (ref 6.4–8.1)

## 2018-04-14 LAB — CBC WITH DIFFERENTIAL (CANCER CENTER ONLY)
BASOS PCT: 0 %
Basophils Absolute: 0 10*3/uL (ref 0.0–0.1)
Eosinophils Absolute: 0 10*3/uL (ref 0.0–0.5)
Eosinophils Relative: 0 %
HEMATOCRIT: 30 % — AB (ref 34.8–46.6)
Hemoglobin: 9.9 g/dL — ABNORMAL LOW (ref 11.6–15.9)
Lymphocytes Relative: 22 %
Lymphs Abs: 3.5 10*3/uL — ABNORMAL HIGH (ref 0.9–3.3)
MCH: 33.6 pg (ref 26.0–34.0)
MCHC: 33 g/dL (ref 32.0–36.0)
MCV: 101.7 fL — ABNORMAL HIGH (ref 81.0–101.0)
MONO ABS: 0.4 10*3/uL (ref 0.1–0.9)
MONOS PCT: 3 %
NEUTROS ABS: 11.8 10*3/uL — AB (ref 1.5–6.5)
Neutrophils Relative %: 75 %
Platelet Count: 259 10*3/uL (ref 145–400)
RBC: 2.95 MIL/uL — ABNORMAL LOW (ref 3.70–5.32)
RDW: 16.5 % — AB (ref 11.1–15.7)
WBC Count: 15.7 10*3/uL — ABNORMAL HIGH (ref 3.9–10.0)

## 2018-04-14 MED ORDER — DIPHENHYDRAMINE HCL 25 MG PO CAPS
ORAL_CAPSULE | ORAL | Status: AC
  Filled 2018-04-14: qty 2

## 2018-04-14 MED ORDER — TRASTUZUMAB CHEMO 150 MG IV SOLR
600.0000 mg | Freq: Once | INTRAVENOUS | Status: AC
  Administered 2018-04-14: 600 mg via INTRAVENOUS
  Filled 2018-04-14: qty 28.57

## 2018-04-14 MED ORDER — DIPHENHYDRAMINE HCL 25 MG PO CAPS
50.0000 mg | ORAL_CAPSULE | Freq: Once | ORAL | Status: AC
  Administered 2018-04-14: 50 mg via ORAL

## 2018-04-14 MED ORDER — ACETAMINOPHEN 325 MG PO TABS
ORAL_TABLET | ORAL | Status: AC
  Filled 2018-04-14: qty 2

## 2018-04-14 MED ORDER — PALONOSETRON HCL INJECTION 0.25 MG/5ML
INTRAVENOUS | Status: AC
  Filled 2018-04-14: qty 5

## 2018-04-14 MED ORDER — PALONOSETRON HCL INJECTION 0.25 MG/5ML
0.2500 mg | Freq: Once | INTRAVENOUS | Status: AC
  Administered 2018-04-14: 0.25 mg via INTRAVENOUS

## 2018-04-14 MED ORDER — ACETAMINOPHEN 325 MG PO TABS
650.0000 mg | ORAL_TABLET | Freq: Once | ORAL | Status: AC
  Administered 2018-04-14: 650 mg via ORAL

## 2018-04-14 MED ORDER — CARBOPLATIN CHEMO INJECTION 450 MG/45ML
350.0000 mg | Freq: Once | INTRAVENOUS | Status: AC
  Administered 2018-04-14: 350 mg via INTRAVENOUS
  Filled 2018-04-14: qty 35

## 2018-04-14 MED ORDER — SODIUM CHLORIDE 0.9 % IV SOLN
67.5000 mg/m2 | Freq: Once | INTRAVENOUS | Status: AC
  Administered 2018-04-14: 140 mg via INTRAVENOUS
  Filled 2018-04-14: qty 14

## 2018-04-14 MED ORDER — SODIUM CHLORIDE 0.9 % IV SOLN
420.0000 mg | Freq: Once | INTRAVENOUS | Status: AC
  Administered 2018-04-14: 420 mg via INTRAVENOUS
  Filled 2018-04-14: qty 14

## 2018-04-14 MED ORDER — SODIUM CHLORIDE 0.9 % IV SOLN
Freq: Once | INTRAVENOUS | Status: AC
  Administered 2018-04-14: 11:00:00 via INTRAVENOUS

## 2018-04-14 MED ORDER — SODIUM CHLORIDE 0.9 % IV SOLN
Freq: Once | INTRAVENOUS | Status: AC
  Administered 2018-04-14: 11:00:00 via INTRAVENOUS
  Filled 2018-04-14: qty 5

## 2018-04-14 MED ORDER — SODIUM CHLORIDE 0.9% FLUSH
10.0000 mL | INTRAVENOUS | Status: DC | PRN
  Administered 2018-04-14: 10 mL
  Filled 2018-04-14: qty 10

## 2018-04-14 MED ORDER — HEPARIN SOD (PORK) LOCK FLUSH 100 UNIT/ML IV SOLN
500.0000 [IU] | Freq: Once | INTRAVENOUS | Status: AC | PRN
  Administered 2018-04-14: 500 [IU]
  Filled 2018-04-14: qty 5

## 2018-04-14 NOTE — Progress Notes (Signed)
Hematology and Oncology Follow Up Visit  RELDA AGOSTO [id number] Mar 26, 1951 67 y.o. 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 Vangieson is here today for follow-up and for her 3rd cycle of chemotherapy.  She is doing a whole lot better.  The last time we saw her, she had been admitted because of renal failure.  I suspect as to the other doctors that this was because of Bactrim.  She was placed on Bactrim for a UTI.  This is a very rare event.  However, it can happen.  She is only hospitalized for couple days.  She basically got IV fluids.  She responded well.  Her creatinine has come back to baseline.  She feels good.  She denies Mother's Day.  She had no nausea or vomiting.  She is eating better.  She is had no diarrhea.  She is had no leg swelling.  There is been no rashes.  Overall, her performance status is ECOG 1.     Medications:  Allergies as of 04/14/2018      Reactions   Bactrim [sulfamethoxazole-trimethoprim] Other (See Comments)   ? Renal failure.   Zofran [ondansetron] Shortness Of Breath   "felt like throat was closing"   Clarithromycin Diarrhea, Nausea And Vomiting   Macrodantin [nitrofurantoin] Rash      Medication List        Accurate as of 04/14/18 10:12 AM. Always use your most recent med list.          acetaminophen 500 MG tablet Commonly known as:  TYLENOL Take 500 mg by mouth every 6 (six) hours as needed for mild pain.   dexamethasone 4 MG tablet Commonly known as:  DECADRON Take 4 mg by mouth 2 (two) times daily. Take 2 pills twice daily for 5 days. Start day before chemotherapy.   doxylamine (Sleep) 25 MG tablet Commonly known as:  UNISOM Take  25 mg by mouth at bedtime as needed for sleep.   lidocaine-prilocaine cream Commonly known as:  EMLA Apply to affected area once   LORazepam 0.5 MG tablet Commonly known as:  ATIVAN TAKE 1 TABLET BY MOUTH EVERY 6 HOURS AS NEEDED FOR NAUSEA/VOMITING   prochlorperazine 10 MG tablet Commonly known as:  COMPAZINE TAKE 1 TABLET (10 MG TOTAL) BY MOUTH EVERY 6 (SIX) HOURS AS NEEDED (NAUSEA OR VOMITING).       Allergies:  Allergies  Allergen Reactions  . Bactrim [Sulfamethoxazole-Trimethoprim] Other (See Comments)    ? Renal failure.  . Zofran [Ondansetron] Shortness Of Breath    "felt like throat was closing"  . Clarithromycin Diarrhea and Nausea And Vomiting  . Macrodantin [Nitrofurantoin] Rash    Past Medical History, Surgical history, Social history, and Family History were reviewed and updated.  Review of Systems: Review of Systems  Constitutional: Negative.   HENT: Negative.   Eyes: Negative.   Respiratory: Negative.   Cardiovascular: Negative.   Gastrointestinal: Negative.   Genitourinary: Negative.  Negative for dysuria.  Musculoskeletal: Negative.   Skin: Negative.   Neurological: Negative.   Endo/Heme/Allergies: Negative.   Psychiatric/Behavioral: Negative.       Physical Exam:  weight is 207 lb (93.9 kg). Her oral temperature is 98.2 F (36.8 C). Her blood pressure is  142/70 (abnormal) and her pulse is 76. Her respiration is 20 and oxygen saturation is 100%.   Wt Readings from Last 3 Encounters:  04/14/18 207 lb (93.9 kg)  04/02/18 208 lb 15.9 oz (94.8 kg)  03/30/18 205 lb 8 oz (93.2 kg)    Physical Exam  Constitutional: She is oriented to person, place, and time.  HENT:  Head: Normocephalic and atraumatic.  Mouth/Throat: Oropharynx is clear and moist.  Eyes: Pupils are equal, round, and reactive to light. EOM are normal.  Neck: Normal range of motion.  Cardiovascular: Normal rate, regular rhythm and normal heart sounds.  Pulmonary/Chest: Effort  normal and breath sounds normal.  Abdominal: Soft. Bowel sounds are normal.  Musculoskeletal: Normal range of motion. She exhibits no edema, tenderness or deformity.  Lymphadenopathy:    She has no cervical adenopathy.  Neurological: She is alert and oriented to person, place, and time.  Skin: Skin is warm and dry. No rash noted. No erythema.  Psychiatric: She has a normal mood and affect. Her behavior is normal. Judgment and thought content normal.  Vitals reviewed.    Lab Results  Component Value Date   WBC 15.7 (H) 04/14/2018   HGB 9.9 (L) 04/14/2018   HCT 30.0 (L) 04/14/2018   MCV 101.7 (H) 04/14/2018   PLT 259 04/14/2018   Lab Results  Component Value Date   FERRITIN 338 (H) 07/31/2011   IRON 36 (L) 03/21/2009   TIBC 232 (L) 03/21/2009   UIBC 196 03/21/2009   IRONPCTSAT 16 (L) 03/21/2009   Lab Results  Component Value Date   RETICCTPCT 1.3 03/27/2011   RBC 2.95 (L) 04/14/2018   RETICCTABS 56.4 03/27/2011   Lab Results  Component Value Date   KPAFRELGTCHN 23.1 (H) 02/16/2018   LAMBDASER 15.8 02/16/2018   KAPLAMBRATIO 1.46 02/16/2018   Lab Results  Component Value Date   IGGSERUM 924 02/16/2018   IGA 222 02/16/2018   IGMSERUM 126 02/16/2018   Lab Results  Component Value Date   TOTALPROTELP 5.2 (L) 03/30/2018   ALBUMINELP 3.0 03/30/2018   A1GS 0.2 03/30/2018   A2GS 0.7 03/30/2018   BETS 0.8 03/30/2018   BETA2SER 0.4 07/02/2015   GAMS 0.6 03/30/2018   MSPIKE Not Observed 03/30/2018   SPEI Comment 03/30/2018     Chemistry      Component Value Date/Time   NA 142 04/14/2018 0900   NA 141 07/06/2017 0816   NA 141 01/05/2017 0956   K 3.7 04/14/2018 0900   K 3.6 07/06/2017 0816   K 4.3 01/05/2017 0956   CL 108 04/14/2018 0900   CL 101 07/06/2017 0816   CO2 24 04/14/2018 0900   CO2 28 07/06/2017 0816   CO2 23 01/05/2017 0956   BUN 25 (H) 04/14/2018 0900   BUN 28 (H) 07/06/2017 0816   BUN 22.4 01/05/2017 0956   CREATININE 1.70 (H) 04/14/2018 0900     CREATININE 1.6 (H) 07/06/2017 0816   CREATININE 1.4 (H) 01/05/2017 0956      Component Value Date/Time   CALCIUM 9.2 04/14/2018 0900   CALCIUM 9.1 07/06/2017 0816   CALCIUM 9.3 01/05/2017 0956   ALKPHOS 63 04/14/2018 0900   ALKPHOS 58 07/06/2017 0816   ALKPHOS 52 01/05/2017 0956   AST 25 04/14/2018 0900   AST 14 01/05/2017 0956   ALT 35 04/14/2018 0900   ALT 58 (H) 07/06/2017 0816   ALT 13 01/05/2017 0956   BILITOT 0.7 04/14/2018 0900   BILITOT 0.56 01/05/2017 8786  Impression and Plan: Ms. Bittman is a very pleasant 67 yo caucasian female with past history of kappa light chain myeloma with stem cell transplant. She has now been diagnosed with locally advanced infiltrating ductal carcinoma of the right breast, ER(-)/PR(-)/HER-2(+), Ki67 of 75%.   We will move ahead with chemotherapy.  We really need to get the 4 cycles into her.  She is responding which is nice to see.  We will plan to get her back in 3 more weeks for her fourth and final cycle of treatment.  After that final cycle, we will see about getting her set up with either a mammogram or MRI depending on what her renal function is.  [name], MD 5/22/201910:12 AM

## 2018-04-14 NOTE — Patient Instructions (Signed)
Pertuzumab injection What is this medicine? PERTUZUMAB (per TOOZ ue mab) is a monoclonal antibody. It is used to treat breast cancer. This medicine may be used for other purposes; ask your health care provider or pharmacist if you have questions. COMMON BRAND NAME(S): PERJETA What should I tell my health care provider before I take this medicine? They need to know if you have any of these conditions: -heart disease -heart failure -high blood pressure -history of irregular heart beat -recent or ongoing radiation therapy -an unusual or allergic reaction to pertuzumab, other medicines, foods, dyes, or preservatives -pregnant or trying to get pregnant -breast-feeding How should I use this medicine? This medicine is for infusion into a vein. It is given by a health care professional in a hospital or clinic setting. Talk to your pediatrician regarding the use of this medicine in children. Special care may be needed. Overdosage: If you think you have taken too much of this medicine contact a poison control center or emergency room at once. NOTE: This medicine is only for you. Do not share this medicine with others. What if I miss a dose? It is important not to miss your dose. Call your doctor or health care professional if you are unable to keep an appointment. What may interact with this medicine? Interactions are not expected. Give your health care provider a list of all the medicines, herbs, non-prescription drugs, or dietary supplements you use. Also tell them if you smoke, drink alcohol, or use illegal drugs. Some items may interact with your medicine. This list may not describe all possible interactions. Give your health care provider a list of all the medicines, herbs, non-prescription drugs, or dietary supplements you use. Also tell them if you smoke, drink alcohol, or use illegal drugs. Some items may interact with your medicine. What should I watch for while using this medicine? Your  condition will be monitored carefully while you are receiving this medicine. Report any side effects. Continue your course of treatment even though you feel ill unless your doctor tells you to stop. Do not become pregnant while taking this medicine or for 7 months after stopping it. Women should inform their doctor if they wish to become pregnant or think they might be pregnant. Women of child-bearing potential will need to have a negative pregnancy test before starting this medicine. There is a potential for serious side effects to an unborn child. Talk to your health care professional or pharmacist for more information. Do not breast-feed an infant while taking this medicine or for 7 months after stopping it. Women must use effective birth control with this medicine. Call your doctor or health care professional for advice if you get a fever, chills or sore throat, or other symptoms of a cold or flu. Do not treat yourself. Try to avoid being around people who are sick. You may experience fever, chills, and headache during the infusion. Report any side effects during the infusion to your health care professional. What side effects may I notice from receiving this medicine? Side effects that you should report to your doctor or health care professional as soon as possible: -breathing problems -chest pain or palpitations -dizziness -feeling faint or lightheaded -fever or chills -skin rash, itching or hives -sore throat -swelling of the face, lips, or tongue -swelling of the legs or ankles -unusually weak or tired Side effects that usually do not require medical attention (report to your doctor or health care professional if they continue or are bothersome): -diarrhea -hair   loss -nausea, vomiting -tiredness This list may not describe all possible side effects. Call your doctor for medical advice about side effects. You may report side effects to FDA at 1-800-FDA-1088. Where should I keep my  medicine? This drug is given in a hospital or clinic and will not be stored at home. NOTE: This sheet is a summary. It may not cover all possible information. If you have questions about this medicine, talk to your doctor, pharmacist, or health care provider.  2018 [employer] Standard (2015-12-13 12:08:50) Trastuzumab injection for infusion What is this medicine? TRASTUZUMAB (tras TOO zoo mab) is a monoclonal antibody. It is used to treat breast cancer and stomach cancer. This medicine may be used for other purposes; ask your health care provider or pharmacist if you have questions. COMMON BRAND NAME(S): Herceptin What should I tell my health care provider before I take this medicine? They need to know if you have any of these conditions: -heart disease -heart failure -lung or breathing disease, like asthma -an unusual or allergic reaction to trastuzumab, benzyl alcohol, or other medications, foods, dyes, or preservatives -pregnant or trying to get pregnant -breast-feeding How should I use this medicine? This drug is given as an infusion into a vein. It is administered in a hospital or clinic by a specially trained health care professional. Talk to your pediatrician regarding the use of this medicine in children. This medicine is not approved for use in children. Overdosage: If you think you have taken too much of this medicine contact a poison control center or emergency room at once. NOTE: This medicine is only for you. Do not share this medicine with others. What if I miss a dose? It is important not to miss a dose. Call your doctor or health care professional if you are unable to keep an appointment. What may interact with this medicine? This medicine may interact with the following medications: -certain types of chemotherapy, such as daunorubicin, doxorubicin, epirubicin, and idarubicin This list may not describe all possible interactions. Give your health care provider a list of  all the medicines, herbs, non-prescription drugs, or dietary supplements you use. Also tell them if you smoke, drink alcohol, or use illegal drugs. Some items may interact with your medicine. What should I watch for while using this medicine? Visit your doctor for checks on your progress. Report any side effects. Continue your course of treatment even though you feel ill unless your doctor tells you to stop. Call your doctor or health care professional for advice if you get a fever, chills or sore throat, or other symptoms of a cold or flu. Do not treat yourself. Try to avoid being around people who are sick. You may experience fever, chills and shaking during your first infusion. These effects are usually mild and can be treated with other medicines. Report any side effects during the infusion to your health care professional. Fever and chills usually do not happen with later infusions. Do not become pregnant while taking this medicine or for 7 months after stopping it. Women should inform their doctor if they wish to become pregnant or think they might be pregnant. Women of child-bearing potential will need to have a negative pregnancy test before starting this medicine. There is a potential for serious side effects to an unborn child. Talk to your health care professional or pharmacist for more information. Do not breast-feed an infant while taking this medicine or for 7 months after stopping it. Women must use effective birth control   with this medicine. What side effects may I notice from receiving this medicine? Side effects that you should report to your doctor or health care professional as soon as possible: -allergic reactions like skin rash, itching or hives, swelling of the face, lips, or tongue -chest pain or palpitations -cough -dizziness -feeling faint or lightheaded, falls -fever -general ill feeling or flu-like symptoms -signs of worsening heart failure like breathing problems;  swelling in your legs and feet -unusually weak or tired Side effects that usually do not require medical attention (report to your doctor or health care professional if they continue or are bothersome): -bone pain -changes in taste -diarrhea -joint pain -nausea/vomiting -weight loss This list may not describe all possible side effects. Call your doctor for medical advice about side effects. You may report side effects to FDA at 1-800-FDA-1088. Where should I keep my medicine? This drug is given in a hospital or clinic and will not be stored at home. NOTE: This sheet is a summary. It may not cover all possible information. If you have questions about this medicine, talk to your doctor, pharmacist, or health care provider.  2018 [employer] Standard (2016-11-04 14:37:52) Carboplatin injection What is this medicine? CARBOPLATIN (KAR boe pla tin) is a chemotherapy drug. It targets fast dividing cells, like cancer cells, and causes these cells to die. This medicine is used to treat ovarian cancer and many other cancers. This medicine may be used for other purposes; ask your health care provider or pharmacist if you have questions. COMMON BRAND NAME(S): Paraplatin What should I tell my health care provider before I take this medicine? They need to know if you have any of these conditions: -blood disorders -hearing problems -kidney disease -recent or ongoing radiation therapy -an unusual or allergic reaction to carboplatin, cisplatin, other chemotherapy, other medicines, foods, dyes, or preservatives -pregnant or trying to get pregnant -breast-feeding How should I use this medicine? This drug is usually given as an infusion into a vein. It is administered in a hospital or clinic by a specially trained health care professional. Talk to your pediatrician regarding the use of this medicine in children. Special care may be needed. Overdosage: If you think you have taken too much of this  medicine contact a poison control center or emergency room at once. NOTE: This medicine is only for you. Do not share this medicine with others. What if I miss a dose? It is important not to miss a dose. Call your doctor or health care professional if you are unable to keep an appointment. What may interact with this medicine? -medicines for seizures -medicines to increase blood counts like filgrastim, pegfilgrastim, sargramostim -some antibiotics like amikacin, gentamicin, neomycin, streptomycin, tobramycin -vaccines Talk to your doctor or health care professional before taking any of these medicines: -acetaminophen -aspirin -ibuprofen -ketoprofen -naproxen This list may not describe all possible interactions. Give your health care provider a list of all the medicines, herbs, non-prescription drugs, or dietary supplements you use. Also tell them if you smoke, drink alcohol, or use illegal drugs. Some items may interact with your medicine. What should I watch for while using this medicine? Your condition will be monitored carefully while you are receiving this medicine. You will need important blood work done while you are taking this medicine. This drug may make you feel generally unwell. This is not uncommon, as chemotherapy can affect healthy cells as well as cancer cells. Report any side effects. Continue your course of treatment even though you feel ill  unless your doctor tells you to stop. In some cases, you may be given additional medicines to help with side effects. Follow all directions for their use. Call your doctor or health care professional for advice if you get a fever, chills or sore throat, or other symptoms of a cold or flu. Do not treat yourself. This drug decreases your body's ability to fight infections. Try to avoid being around people who are sick. This medicine may increase your risk to bruise or bleed. Call your doctor or health care professional if you notice any  unusual bleeding. Be careful brushing and flossing your teeth or using a toothpick because you may get an infection or bleed more easily. If you have any dental work done, tell your dentist you are receiving this medicine. Avoid taking products that contain aspirin, acetaminophen, ibuprofen, naproxen, or ketoprofen unless instructed by your doctor. These medicines may hide a fever. Do not become pregnant while taking this medicine. Women should inform their doctor if they wish to become pregnant or think they might be pregnant. There is a potential for serious side effects to an unborn child. Talk to your health care professional or pharmacist for more information. Do not breast-feed an infant while taking this medicine. What side effects may I notice from receiving this medicine? Side effects that you should report to your doctor or health care professional as soon as possible: -allergic reactions like skin rash, itching or hives, swelling of the face, lips, or tongue -signs of infection - fever or chills, cough, sore throat, pain or difficulty passing urine -signs of decreased platelets or bleeding - bruising, pinpoint red spots on the skin, black, tarry stools, nosebleeds -signs of decreased red blood cells - unusually weak or tired, fainting spells, lightheadedness -breathing problems -changes in hearing -changes in vision -chest pain -high blood pressure -low blood counts - This drug may decrease the number of white blood cells, red blood cells and platelets. You may be at increased risk for infections and bleeding. -nausea and vomiting -pain, swelling, redness or irritation at the injection site -pain, tingling, numbness in the hands or feet -problems with balance, talking, walking -trouble passing urine or change in the amount of urine Side effects that usually do not require medical attention (report to your doctor or health care professional if they continue or are bothersome): -hair  loss -loss of appetite -metallic taste in the mouth or changes in taste This list may not describe all possible side effects. Call your doctor for medical advice about side effects. You may report side effects to FDA at 1-800-FDA-1088. Where should I keep my medicine? This drug is given in a hospital or clinic and will not be stored at home. NOTE: This sheet is a summary. It may not cover all possible information. If you have questions about this medicine, talk to your doctor, pharmacist, or health care provider.  2018 [employer] Standard (2008-02-15 14:38:05)  Docetaxel injection What is this medicine? DOCETAXEL (doe se TAX el) is a chemotherapy drug. It targets fast dividing cells, like cancer cells, and causes these cells to die. This medicine is used to treat many types of cancers like breast cancer, certain stomach cancers, head and neck cancer, lung cancer, and prostate cancer. This medicine may be used for other purposes; ask your health care provider or pharmacist if you have questions. COMMON BRAND NAME(S): Docefrez, Taxotere What should I tell my health care provider before I take this medicine? They need to know if you  have any of these conditions: -infection (especially a virus infection such as chickenpox, cold sores, or herpes) -liver disease -low blood counts, like low white cell, platelet, or red cell counts -an unusual or allergic reaction to docetaxel, polysorbate 80, other chemotherapy agents, other medicines, foods, dyes, or preservatives -pregnant or trying to get pregnant -breast-feeding How should I use this medicine? This drug is given as an infusion into a vein. It is administered in a hospital or clinic by a specially trained health care professional. Talk to your pediatrician regarding the use of this medicine in children. Special care may be needed. Overdosage: If you think you have taken too much of this medicine contact a poison control center or emergency  room at once. NOTE: This medicine is only for you. Do not share this medicine with others. What if I miss a dose? It is important not to miss your dose. Call your doctor or health care professional if you are unable to keep an appointment. What may interact with this medicine? -cyclosporine -erythromycin -ketoconazole -medicines to increase blood counts like filgrastim, pegfilgrastim, sargramostim -vaccines Talk to your doctor or health care professional before taking any of these medicines: -acetaminophen -aspirin -ibuprofen -ketoprofen -naproxen This list may not describe all possible interactions. Give your health care provider a list of all the medicines, herbs, non-prescription drugs, or dietary supplements you use. Also tell them if you smoke, drink alcohol, or use illegal drugs. Some items may interact with your medicine. What should I watch for while using this medicine? Your condition will be monitored carefully while you are receiving this medicine. You will need important blood work done while you are taking this medicine. This drug may make you feel generally unwell. This is not uncommon, as chemotherapy can affect healthy cells as well as cancer cells. Report any side effects. Continue your course of treatment even though you feel ill unless your doctor tells you to stop. In some cases, you may be given additional medicines to help with side effects. Follow all directions for their use. Call your doctor or health care professional for advice if you get a fever, chills or sore throat, or other symptoms of a cold or flu. Do not treat yourself. This drug decreases your body's ability to fight infections. Try to avoid being around people who are sick. This medicine may increase your risk to bruise or bleed. Call your doctor or health care professional if you notice any unusual bleeding. This medicine may contain alcohol in the product. You may get drowsy or dizzy. Do not drive, use  machinery, or do anything that needs mental alertness until you know how this medicine affects you. Do not stand or sit up quickly, especially if you are an older patient. This reduces the risk of dizzy or fainting spells. Avoid alcoholic drinks. Do not become pregnant while taking this medicine. Women should inform their doctor if they wish to become pregnant or think they might be pregnant. There is a potential for serious side effects to an unborn child. Talk to your health care professional or pharmacist for more information. Do not breast-feed an infant while taking this medicine. What side effects may I notice from receiving this medicine? Side effects that you should report to your doctor or health care professional as soon as possible: -allergic reactions like skin rash, itching or hives, swelling of the face, lips, or tongue -low blood counts - This drug may decrease the number of white blood cells, red blood cells  and platelets. You may be at increased risk for infections and bleeding. -signs of infection - fever or chills, cough, sore throat, pain or difficulty passing urine -signs of decreased platelets or bleeding - bruising, pinpoint red spots on the skin, black, tarry stools, nosebleeds -signs of decreased red blood cells - unusually weak or tired, fainting spells, lightheadedness -breathing problems -fast or irregular heartbeat -low blood pressure -mouth sores -nausea and vomiting -pain, swelling, redness or irritation at the injection site -pain, tingling, numbness in the hands or feet -swelling of the ankle, feet, hands -weight gain Side effects that usually do not require medical attention (report to your doctor or health care professional if they continue or are bothersome): -bone pain -complete hair loss including hair on your head, underarms, pubic hair, eyebrows, and eyelashes -diarrhea -excessive tearing -changes in the color of fingernails -loosening of the  fingernails -nausea -muscle pain -red flush to skin -sweating -weak or tired This list may not describe all possible side effects. Call your doctor for medical advice about side effects. You may report side effects to FDA at 1-800-FDA-1088. Where should I keep my medicine? This drug is given in a hospital or clinic and will not be stored at home. NOTE: This sheet is a summary. It may not cover all possible information. If you have questions about this medicine, talk to your doctor, pharmacist, or health care provider.  2018 [employer] Standard (2015-12-13 12:32:56)

## 2018-04-14 NOTE — Patient Instructions (Signed)

## 2018-04-16 ENCOUNTER — Inpatient Hospital Stay: Payer: 59

## 2018-04-16 VITALS — BP 135/80 | HR 68 | Temp 98.1°F | Resp 19

## 2018-04-16 DIAGNOSIS — C50411 Malignant neoplasm of upper-outer quadrant of right female breast: Secondary | ICD-10-CM | POA: Diagnosis not present

## 2018-04-16 DIAGNOSIS — C50911 Malignant neoplasm of unspecified site of right female breast: Secondary | ICD-10-CM

## 2018-04-16 MED ORDER — PEGFILGRASTIM-CBQV 6 MG/0.6ML ~~LOC~~ SOSY
6.0000 mg | PREFILLED_SYRINGE | Freq: Once | SUBCUTANEOUS | Status: AC
  Administered 2018-04-16: 6 mg via SUBCUTANEOUS

## 2018-04-16 MED ORDER — PEGFILGRASTIM-CBQV 6 MG/0.6ML ~~LOC~~ SOSY
PREFILLED_SYRINGE | SUBCUTANEOUS | Status: AC
  Filled 2018-04-16: qty 0.6

## 2018-04-16 NOTE — Patient Instructions (Signed)
Pegaspargase injection What is this medicine? PEGASPARGASE (peg AS par Wynelle Bourgeois) is a chemotherapy drug. This medicine is used to treat certain types of leukemia. This medicine may be used for other purposes; ask your health care provider or pharmacist if you have questions. COMMON BRAND NAME(S): Oncaspar What should I tell my health care provider before I take this medicine? They need to know if you have any of these conditions: -bleeding disorders -diabetes or other problems with blood sugar -history of pancreatitis -an unusual or allergic reaction to pegaspargase, L-asparaginase, e-coli proteins, other chemotherapy agents, other medicines, foods, dyes, or preservatives -pregnant or trying to get pregnant -breast-feeding How should I use this medicine? This drug is given as an infusion into a vein or an injection into a muscle. It is administered in a hospital or clinic by a specially trained health care professional. Talk to your pediatrician regarding the use of this medicine in children. Special care may be needed. Overdosage: If you think you have taken too much of this medicine contact a poison control center or emergency room at once. NOTE: This medicine is only for you. Do not share this medicine with others. What if I miss a dose? It is important not to miss your dose. Call your doctor or health care professional if you are unable to keep an appointment. What may interact with this medicine? Do not take this medicine with any of the following medications: -cytarabine, ARA-C -methotrexate -vaccines -vincristine Talk to your doctor or health care professional before taking any of these medicines: -acetaminophen -aspirin -ibuprofen -ketoprofen -naproxen This list may not describe all possible interactions. Give your health care provider a list of all the medicines, herbs, non-prescription drugs, or dietary supplements you use. Also tell them if you smoke, drink alcohol, or use  illegal drugs. Some items may interact with your medicine. What should I watch for while using this medicine? Your condition will be monitored carefully while you are receiving this medicine. You will need important blood work done while you are taking this medicine. This drug may make you feel generally unwell. This is not uncommon, as chemotherapy can affect healthy cells as well as cancer cells. Report any side effects. Continue your course of treatment even though you feel ill unless your doctor tells you to stop. Call your doctor or health care professional if you get a severe headache or have a severe stomach pain with nausea and vomiting. This medicine may increase your risk to bruise or bleed. Call your doctor or health care professional if you notice any unusual bleeding. Avoid taking products that contain aspirin, acetaminophen, ibuprofen, naproxen, or ketoprofen unless instructed by your doctor. These medicines may hide a fever. What side effects may I notice from receiving this medicine? Side effects that you should report to your doctor or health care professional as soon as possible: -allergic reactions like skin rash, itching or hives, swelling of the face, lips, or tongue -breathing problems -chest pain -confusion, agitation, or hallucinations -feeling faint or lightheaded, falls -increased thirst -loss of appetite, nausea -nausea and vomiting -pain, swelling, warmth in the leg -problems with balance, walking, talking -seizures -stomach pain or swelling -sudden headache -swelling of the ankles, feet, hands -trouble passing urine or change in the amount of urine -unusual bleeding or bruising -unusually weak or tired Side effects that usually do not require medical attention (report to your doctor or health care professional if they continue or are bothersome): -sweating -weight loss This list may not describe  all possible side effects. Call your doctor for medical advice  about side effects. You may report side effects to FDA at 1-800-FDA-1088. Where should I keep my medicine? This drug is given in a hospital or clinic and will not be stored at home. NOTE: This sheet is a summary. It may not cover all possible information. If you have questions about this medicine, talk to your doctor, pharmacist, or health care provider.  2018 [employer] (2008-06-08 14:14:04)

## 2018-04-20 ENCOUNTER — Ambulatory Visit: Payer: 59 | Admitting: Hematology & Oncology

## 2018-04-20 ENCOUNTER — Other Ambulatory Visit: Payer: 59

## 2018-04-20 ENCOUNTER — Ambulatory Visit: Payer: 59

## 2018-04-22 ENCOUNTER — Ambulatory Visit: Payer: 59

## 2018-05-05 ENCOUNTER — Other Ambulatory Visit: Payer: Self-pay

## 2018-05-05 ENCOUNTER — Inpatient Hospital Stay: Payer: 59

## 2018-05-05 ENCOUNTER — Inpatient Hospital Stay: Payer: 59 | Attending: Hematology & Oncology | Admitting: Hematology & Oncology

## 2018-05-05 ENCOUNTER — Encounter: Payer: Self-pay | Admitting: Hematology & Oncology

## 2018-05-05 VITALS — BP 107/61 | HR 50 | Temp 98.5°F | Wt 198.1 lb

## 2018-05-05 DIAGNOSIS — C50411 Malignant neoplasm of upper-outer quadrant of right female breast: Secondary | ICD-10-CM | POA: Insufficient documentation

## 2018-05-05 DIAGNOSIS — Z79899 Other long term (current) drug therapy: Secondary | ICD-10-CM | POA: Diagnosis not present

## 2018-05-05 DIAGNOSIS — C50911 Malignant neoplasm of unspecified site of right female breast: Secondary | ICD-10-CM

## 2018-05-05 DIAGNOSIS — Z7689 Persons encountering health services in other specified circumstances: Secondary | ICD-10-CM | POA: Diagnosis not present

## 2018-05-05 DIAGNOSIS — Z9484 Stem cells transplant status: Secondary | ICD-10-CM | POA: Insufficient documentation

## 2018-05-05 DIAGNOSIS — Z5111 Encounter for antineoplastic chemotherapy: Secondary | ICD-10-CM | POA: Diagnosis not present

## 2018-05-05 DIAGNOSIS — C9001 Multiple myeloma in remission: Secondary | ICD-10-CM | POA: Insufficient documentation

## 2018-05-05 DIAGNOSIS — Z171 Estrogen receptor negative status [ER-]: Secondary | ICD-10-CM | POA: Insufficient documentation

## 2018-05-05 DIAGNOSIS — Z5112 Encounter for antineoplastic immunotherapy: Secondary | ICD-10-CM | POA: Insufficient documentation

## 2018-05-05 LAB — CBC WITH DIFFERENTIAL (CANCER CENTER ONLY)
BASOS ABS: 0 10*3/uL (ref 0.0–0.1)
BASOS PCT: 0 %
EOS ABS: 0 10*3/uL (ref 0.0–0.5)
EOS PCT: 0 %
HCT: 25.3 % — ABNORMAL LOW (ref 34.8–46.6)
Hemoglobin: 8.1 g/dL — ABNORMAL LOW (ref 11.6–15.9)
LYMPHS PCT: 22 %
Lymphs Abs: 3 10*3/uL (ref 0.9–3.3)
MCH: 34.6 pg — ABNORMAL HIGH (ref 26.0–34.0)
MCHC: 32 g/dL (ref 32.0–36.0)
MCV: 108.1 fL — ABNORMAL HIGH (ref 81.0–101.0)
Monocytes Absolute: 0.6 10*3/uL (ref 0.1–0.9)
Monocytes Relative: 5 %
Neutro Abs: 10.5 10*3/uL — ABNORMAL HIGH (ref 1.5–6.5)
Neutrophils Relative %: 73 %
PLATELETS: 172 10*3/uL (ref 145–400)
RBC: 2.34 MIL/uL — AB (ref 3.70–5.32)
RDW: 19.2 % — ABNORMAL HIGH (ref 11.1–15.7)
WBC: 14.1 10*3/uL — AB (ref 3.9–10.0)

## 2018-05-05 LAB — CMP (CANCER CENTER ONLY)
ALBUMIN: 3.5 g/dL (ref 3.5–5.0)
ALT: 21 U/L (ref 10–47)
AST: 18 U/L (ref 11–38)
Alkaline Phosphatase: 57 U/L (ref 26–84)
Anion gap: 9 (ref 5–15)
BUN: 17 mg/dL (ref 7–22)
CALCIUM: 9 mg/dL (ref 8.0–10.3)
CO2: 23 mmol/L (ref 18–33)
Chloride: 105 mmol/L (ref 98–108)
Creatinine: 1.4 mg/dL — ABNORMAL HIGH (ref 0.60–1.20)
GLUCOSE: 123 mg/dL — AB (ref 73–118)
Potassium: 3.7 mmol/L (ref 3.3–4.7)
Sodium: 137 mmol/L (ref 128–145)
TOTAL PROTEIN: 6.2 g/dL — AB (ref 6.4–8.1)
Total Bilirubin: 0.6 mg/dL (ref 0.2–1.6)

## 2018-05-05 MED ORDER — SODIUM CHLORIDE 0.9 % IV SOLN
Freq: Once | INTRAVENOUS | Status: AC
  Administered 2018-05-05: 10:00:00 via INTRAVENOUS

## 2018-05-05 MED ORDER — SODIUM CHLORIDE 0.9 % IV SOLN: 390.0000 mg | Freq: Once | INTRAVENOUS | Status: DC

## 2018-05-05 MED ORDER — SODIUM CHLORIDE 0.9 % IV SOLN
Freq: Once | INTRAVENOUS | Status: AC
  Administered 2018-05-05: 11:00:00 via INTRAVENOUS
  Filled 2018-05-05: qty 5

## 2018-05-05 MED ORDER — SODIUM CHLORIDE 0.9 % IV SOLN
390.0000 mg | Freq: Once | INTRAVENOUS | Status: AC
  Administered 2018-05-05: 390 mg via INTRAVENOUS
  Filled 2018-05-05: qty 39

## 2018-05-05 MED ORDER — PALONOSETRON HCL INJECTION 0.25 MG/5ML
INTRAVENOUS | Status: AC
  Filled 2018-05-05: qty 5

## 2018-05-05 MED ORDER — DOCETAXEL CHEMO INJECTION 160 MG/16ML
67.5000 mg/m2 | Freq: Once | INTRAVENOUS | Status: AC
  Administered 2018-05-05: 140 mg via INTRAVENOUS
  Filled 2018-05-05: qty 14

## 2018-05-05 MED ORDER — PALONOSETRON HCL INJECTION 0.25 MG/5ML
0.2500 mg | Freq: Once | INTRAVENOUS | Status: AC
  Administered 2018-05-05: 0.25 mg via INTRAVENOUS

## 2018-05-05 MED ORDER — TRASTUZUMAB CHEMO 150 MG IV SOLR
600.0000 mg | Freq: Once | INTRAVENOUS | Status: AC
  Administered 2018-05-05: 600 mg via INTRAVENOUS
  Filled 2018-05-05: qty 28.57

## 2018-05-05 MED ORDER — ACETAMINOPHEN 325 MG PO TABS
ORAL_TABLET | ORAL | Status: AC
  Filled 2018-05-05: qty 2

## 2018-05-05 MED ORDER — HEPARIN SOD (PORK) LOCK FLUSH 100 UNIT/ML IV SOLN
500.0000 [IU] | Freq: Once | INTRAVENOUS | Status: AC | PRN
  Administered 2018-05-05: 500 [IU]
  Filled 2018-05-05: qty 5

## 2018-05-05 MED ORDER — SODIUM CHLORIDE 0.9% FLUSH
10.0000 mL | INTRAVENOUS | Status: DC | PRN
  Administered 2018-05-05: 10 mL
  Filled 2018-05-05: qty 10

## 2018-05-05 MED ORDER — SODIUM CHLORIDE 0.9 % IV SOLN
420.0000 mg | Freq: Once | INTRAVENOUS | Status: AC
  Administered 2018-05-05: 420 mg via INTRAVENOUS
  Filled 2018-05-05: qty 14

## 2018-05-05 MED ORDER — ACETAMINOPHEN 325 MG PO TABS
650.0000 mg | ORAL_TABLET | Freq: Once | ORAL | Status: AC
  Administered 2018-05-05: 650 mg via ORAL

## 2018-05-05 MED ORDER — DIPHENHYDRAMINE HCL 25 MG PO CAPS
ORAL_CAPSULE | ORAL | Status: AC
  Filled 2018-05-05: qty 1

## 2018-05-05 MED ORDER — DIPHENHYDRAMINE HCL 25 MG PO CAPS
50.0000 mg | ORAL_CAPSULE | Freq: Once | ORAL | Status: AC
  Administered 2018-05-05: 25 mg via ORAL

## 2018-05-05 NOTE — Patient Instructions (Signed)
Implanted Port Insertion, Care After °This sheet gives you information about how to care for yourself after your procedure. Your health care provider may also give you more specific instructions. If you have problems or questions, contact your health care provider. °What can I expect after the procedure? °After your procedure, it is common to have: °· Discomfort at the port insertion site. °· Bruising on the skin over the port. This should improve over 3-4 days. ° °Follow these instructions at home: °Port care °· After your port is placed, you will get a manufacturer's information card. The card has information about your port. Keep this card with you at all times. °· Take care of the port as told by your health care provider. Ask your health care provider if you or a family member can get training for taking care of the port at home. A home health care nurse may also take care of the port. °· Make sure to remember what type of port you have. °Incision care °· Follow instructions from your health care provider about how to take care of your port insertion site. Make sure you: °? Wash your hands with soap and water before you change your bandage (dressing). If soap and water are not available, use hand sanitizer. °? Change your dressing as told by your health care provider. °? Leave stitches (sutures), skin glue, or adhesive strips in place. These skin closures may need to stay in place for 2 weeks or longer. If adhesive strip edges start to loosen and curl up, you may trim the loose edges. Do not remove adhesive strips completely unless your health care provider tells you to do that. °· Check your port insertion site every day for signs of infection. Check for: °? More redness, swelling, or pain. °? More fluid or blood. °? Warmth. °? Pus or a bad smell. °General instructions °· Do not take baths, swim, or use a hot tub until your health care provider approves. °· Do not lift anything that is heavier than 10 lb (4.5  kg) for a week, or as told by your health care provider. °· Ask your health care provider when it is okay to: °? Return to work or school. °? Resume usual physical activities or sports. °· Do not drive for 24 hours if you were given a medicine to help you relax (sedative). °· Take over-the-counter and prescription medicines only as told by your health care provider. °· Wear a medical alert bracelet in case of an emergency. This will tell any health care providers that you have a port. °· Keep all follow-up visits as told by your health care provider. This is important. °Contact a health care provider if: °· You cannot flush your port with saline as directed, or you cannot draw blood from the port. °· You have a fever or chills. °· You have more redness, swelling, or pain around your port insertion site. °· You have more fluid or blood coming from your port insertion site. °· Your port insertion site feels warm to the touch. °· You have pus or a bad smell coming from the port insertion site. °Get help right away if: °· You have chest pain or shortness of breath. °· You have bleeding from your port that you cannot control. °Summary °· Take care of the port as told by your health care provider. °· Change your dressing as told by your health care provider. °· Keep all follow-up visits as told by your health care provider. °  This information is not intended to replace advice given to you by your health care provider. Make sure you discuss any questions you have with your health care provider. °Document Released: 08/31/2013 Document Revised: 10/01/2016 Document Reviewed: 10/01/2016 °[employer] Interactive Patient Education © 2017 [employer] ° °

## 2018-05-05 NOTE — Patient Instructions (Signed)
Aprepitant capsules What is this medicine? APREPITANT (ap RE pi tant) is used with other medicines to prevent nausea and vomiting caused by cancer treatment (chemotherapy). It is also used alone to prevent nausea and vomiting caused by anesthesia used during surgery. This medicine may be used for other purposes; ask your health care provider or pharmacist if you have questions. COMMON BRAND NAME(S): Emend What should I tell my health care provider before I take this medicine? They need to know if you have any of these conditions: -liver disease -an unusual or allergic reaction to aprepitant, fosaprepitant, other medicines, foods, dyes, or preservatives -pregnant or trying to get pregnant -breast-feeding How should I use this medicine? Take this medicine by mouth with a glass of water. Follow the directions on the prescription label. Usually, you will take your first dose one hour before your chemotherapy begins, and then once daily in the morning for the next 2 days after your chemotherapy treatment. This medicine may be taken with or without food. Do not take more often than directed. Talk to your pediatrician regarding the use of this medicine in children. While this drug may be prescribed for children as young as 12 years for selected conditions, precautions do apply. Overdosage: If you think you have taken too much of this medicine contact a poison control center or emergency room at once. NOTE: This medicine is only for you. Do not share this medicine with others. What if I miss a dose? If you miss a dose, take it as soon as you can. If it is almost time for your next dose, take only that dose. Do not take double or extra doses. What may interact with this medicine? Do not take this medicine with any of these medicines: -cisapride -flibanserin -lomitapide -pimozide This medicine may also interact with the following medications: -diltiazem -female hormones, like estrogens or progestins  and birth control pills -medicines for fungal infections like ketoconazole and itraconazole -medicines for HIV -medicines for seizures or to control epilepsy like carbamazepine or phenytoin -medicines used for sleep or anxiety disorders like alprazolam, diazepam, or midazolam -nefazodone -paroxetine -ranolazine -rifampin -some chemotherapy medications like etoposide, ifosfamide, vinblastine, vincristine -some antibiotics like clarithromycin, erythromycin, troleandomycin -steroid medicines like dexamethasone or methylprednisolone -tolbutamide -warfarin This list may not describe all possible interactions. Give your health care provider a list of all the medicines, herbs, non-prescription drugs, or dietary supplements you use. Also tell them if you smoke, drink alcohol, or use illegal drugs. Some items may interact with your medicine. What should I watch for while using this medicine? Do not take this medicine if you already have nausea and vomiting. Ask your health care provider what to do if you already have nausea. Birth control pills and other methods of hormonal contraception (for example, IUD or patch) may not work properly while you are taking this medicine. Use an extra method of birth control during treatment and for 1 month after your last dose of aprepitant. This medicine should not be used continuously for a long time. Visit your doctor or health care professional for regular check-ups. This medicine may change your liver function blood test results. What side effects may I notice from receiving this medicine? Side effects that you should report to your doctor or health care professional as soon as possible: -allergic reactions like skin rash, itching or hives, swelling of the face, lips, or tongue -breathing problems -changes in heart rhythm -high or low blood pressure -rectal bleeding -serious dizziness or disorientation, confusion -  sharp or severe stomach pain -sharp pain in  your leg Side effects that usually do not require medical attention (report to your doctor or health care professional if they continue or are bothersome): -constipation or diarrhea -hair loss -headache -hiccups -loss of appetite -nausea -upset stomach -tiredness This list may not describe all possible side effects. Call your doctor for medical advice about side effects. You may report side effects to FDA at 1-800-FDA-1088. Where should I keep my medicine? Keep out of the reach of children. Store at room temperature between 20 and 25 degrees C (68 and 77 degrees F). Throw away any unused medicine after the expiration date. NOTE: This sheet is a summary. It may not cover all possible information. If you have questions about this medicine, talk to your doctor, pharmacist, or health care provider.  2018 Elsevier/Gold Standard (2014-12-27 09:59:34) Palonosetron Injection What is this medicine? PALONOSETRON (pal oh NOE se tron) is used to prevent nausea and vomiting caused by chemotherapy. It also helps prevent delayed nausea and vomiting that may occur a few days after your treatment. This medicine may be used for other purposes; ask your health care provider or pharmacist if you have questions. COMMON BRAND NAME(S): Aloxi What should I tell my health care provider before I take this medicine? They need to know if you have any of these conditions: -an unusual or allergic reaction to palonosetron, dolasetron, granisetron, ondansetron, other medicines, foods, dyes, or preservatives -pregnant or trying to get pregnant -breast-feeding How should I use this medicine? This medicine is for infusion into a vein. It is given by a health care professional in a hospital or clinic setting. Talk to your pediatrician regarding the use of this medicine in children. While this drug may be prescribed for children as young as 1 month for selected conditions, precautions do apply. Overdosage: If you think you  have taken too much of this medicine contact a poison control center or emergency room at once. NOTE: This medicine is only for you. Do not share this medicine with others. What if I miss a dose? This does not apply. What may interact with this medicine? -certain medicines for depression, anxiety, or psychotic disturbances -fentanyl -linezolid -MAOIs like Carbex, Eldepryl, Marplan, Nardil, and Parnate -methylene blue (injected into a vein) -tramadol This list may not describe all possible interactions. Give your health care provider a list of all the medicines, herbs, non-prescription drugs, or dietary supplements you use. Also tell them if you smoke, drink alcohol, or use illegal drugs. Some items may interact with your medicine. What should I watch for while using this medicine? Your condition will be monitored carefully while you are receiving this medicine. What side effects may I notice from receiving this medicine? Side effects that you should report to your doctor or health care professional as soon as possible: -allergic reactions like skin rash, itching or hives, swelling of the face, lips, or tongue -breathing problems -confusion -dizziness -fast, irregular heartbeat -fever and chills -loss of balance or coordination -seizures -sweating -swelling of the hands and feet -tremors -unusually weak or tired Side effects that usually do not require medical attention (report to your doctor or health care professional if they continue or are bothersome): -constipation or diarrhea -headache This list may not describe all possible side effects. Call your doctor for medical advice about side effects. You may report side effects to FDA at 1-800-FDA-1088. Where should I keep my medicine? This drug is given in a hospital or clinic and  will not be stored at home. NOTE: This sheet is a summary. It may not cover all possible information. If you have questions about this medicine, talk to  your doctor, pharmacist, or health care provider.  2018 Elsevier/Gold Standard (2013-09-16 10:38:36) Pertuzumab injection What is this medicine? PERTUZUMAB (per TOOZ ue mab) is a monoclonal antibody. It is used to treat breast cancer. This medicine may be used for other purposes; ask your health care provider or pharmacist if you have questions. COMMON BRAND NAME(S): PERJETA What should I tell my health care provider before I take this medicine? They need to know if you have any of these conditions: -heart disease -heart failure -high blood pressure -history of irregular heart beat -recent or ongoing radiation therapy -an unusual or allergic reaction to pertuzumab, other medicines, foods, dyes, or preservatives -pregnant or trying to get pregnant -breast-feeding How should I use this medicine? This medicine is for infusion into a vein. It is given by a health care professional in a hospital or clinic setting. Talk to your pediatrician regarding the use of this medicine in children. Special care may be needed. Overdosage: If you think you have taken too much of this medicine contact a poison control center or emergency room at once. NOTE: This medicine is only for you. Do not share this medicine with others. What if I miss a dose? It is important not to miss your dose. Call your doctor or health care professional if you are unable to keep an appointment. What may interact with this medicine? Interactions are not expected. Give your health care provider a list of all the medicines, herbs, non-prescription drugs, or dietary supplements you use. Also tell them if you smoke, drink alcohol, or use illegal drugs. Some items may interact with your medicine. This list may not describe all possible interactions. Give your health care provider a list of all the medicines, herbs, non-prescription drugs, or dietary supplements you use. Also tell them if you smoke, drink alcohol, or use illegal drugs.  Some items may interact with your medicine. What should I watch for while using this medicine? Your condition will be monitored carefully while you are receiving this medicine. Report any side effects. Continue your course of treatment even though you feel ill unless your doctor tells you to stop. Do not become pregnant while taking this medicine or for 7 months after stopping it. Women should inform their doctor if they wish to become pregnant or think they might be pregnant. Women of child-bearing potential will need to have a negative pregnancy test before starting this medicine. There is a potential for serious side effects to an unborn child. Talk to your health care professional or pharmacist for more information. Do not breast-feed an infant while taking this medicine or for 7 months after stopping it. Women must use effective birth control with this medicine. Call your doctor or health care professional for advice if you get a fever, chills or sore throat, or other symptoms of a cold or flu. Do not treat yourself. Try to avoid being around people who are sick. You may experience fever, chills, and headache during the infusion. Report any side effects during the infusion to your health care professional. What side effects may I notice from receiving this medicine? Side effects that you should report to your doctor or health care professional as soon as possible: -breathing problems -chest pain or palpitations -dizziness -feeling faint or lightheaded -fever or chills -skin rash, itching or hives -sore throat -  swelling of the face, lips, or tongue -swelling of the legs or ankles -unusually weak or tired Side effects that usually do not require medical attention (report to your doctor or health care professional if they continue or are bothersome): -diarrhea -hair loss -nausea, vomiting -tiredness This list may not describe all possible side effects. Call your doctor for medical advice  about side effects. You may report side effects to FDA at 1-800-FDA-1088. Where should I keep my medicine? This drug is given in a hospital or clinic and will not be stored at home. NOTE: This sheet is a summary. It may not cover all possible information. If you have questions about this medicine, talk to your doctor, pharmacist, or health care provider.  2018 Elsevier/Gold Standard (2015-12-13 12:08:50) Trastuzumab injection for infusion What is this medicine? TRASTUZUMAB (tras TOO zoo mab) is a monoclonal antibody. It is used to treat breast cancer and stomach cancer. This medicine may be used for other purposes; ask your health care provider or pharmacist if you have questions. COMMON BRAND NAME(S): Herceptin What should I tell my health care provider before I take this medicine? They need to know if you have any of these conditions: -heart disease -heart failure -lung or breathing disease, like asthma -an unusual or allergic reaction to trastuzumab, benzyl alcohol, or other medications, foods, dyes, or preservatives -pregnant or trying to get pregnant -breast-feeding How should I use this medicine? This drug is given as an infusion into a vein. It is administered in a hospital or clinic by a specially trained health care professional. Talk to your pediatrician regarding the use of this medicine in children. This medicine is not approved for use in children. Overdosage: If you think you have taken too much of this medicine contact a poison control center or emergency room at once. NOTE: This medicine is only for you. Do not share this medicine with others. What if I miss a dose? It is important not to miss a dose. Call your doctor or health care professional if you are unable to keep an appointment. What may interact with this medicine? This medicine may interact with the following medications: -certain types of chemotherapy, such as daunorubicin, doxorubicin, epirubicin, and  idarubicin This list may not describe all possible interactions. Give your health care provider a list of all the medicines, herbs, non-prescription drugs, or dietary supplements you use. Also tell them if you smoke, drink alcohol, or use illegal drugs. Some items may interact with your medicine. What should I watch for while using this medicine? Visit your doctor for checks on your progress. Report any side effects. Continue your course of treatment even though you feel ill unless your doctor tells you to stop. Call your doctor or health care professional for advice if you get a fever, chills or sore throat, or other symptoms of a cold or flu. Do not treat yourself. Try to avoid being around people who are sick. You may experience fever, chills and shaking during your first infusion. These effects are usually mild and can be treated with other medicines. Report any side effects during the infusion to your health care professional. Fever and chills usually do not happen with later infusions. Do not become pregnant while taking this medicine or for 7 months after stopping it. Women should inform their doctor if they wish to become pregnant or think they might be pregnant. Women of child-bearing potential will need to have a negative pregnancy test before starting this medicine. There is a  potential for serious side effects to an unborn child. Talk to your health care professional or pharmacist for more information. Do not breast-feed an infant while taking this medicine or for 7 months after stopping it. Women must use effective birth control with this medicine. What side effects may I notice from receiving this medicine? Side effects that you should report to your doctor or health care professional as soon as possible: -allergic reactions like skin rash, itching or hives, swelling of the face, lips, or tongue -chest pain or palpitations -cough -dizziness -feeling faint or lightheaded,  falls -fever -general ill feeling or flu-like symptoms -signs of worsening heart failure like breathing problems; swelling in your legs and feet -unusually weak or tired Side effects that usually do not require medical attention (report to your doctor or health care professional if they continue or are bothersome): -bone pain -changes in taste -diarrhea -joint pain -nausea/vomiting -weight loss This list may not describe all possible side effects. Call your doctor for medical advice about side effects. You may report side effects to FDA at 1-800-FDA-1088. Where should I keep my medicine? This drug is given in a hospital or clinic and will not be stored at home. NOTE: This sheet is a summary. It may not cover all possible information. If you have questions about this medicine, talk to your doctor, pharmacist, or health care provider.  2018 Elsevier/Gold Standard (2016-11-04 14:37:52) Carboplatin injection What is this medicine? CARBOPLATIN (KAR boe pla tin) is a chemotherapy drug. It targets fast dividing cells, like cancer cells, and causes these cells to die. This medicine is used to treat ovarian cancer and many other cancers. This medicine may be used for other purposes; ask your health care provider or pharmacist if you have questions. COMMON BRAND NAME(S): Paraplatin What should I tell my health care provider before I take this medicine? They need to know if you have any of these conditions: -blood disorders -hearing problems -kidney disease -recent or ongoing radiation therapy -an unusual or allergic reaction to carboplatin, cisplatin, other chemotherapy, other medicines, foods, dyes, or preservatives -pregnant or trying to get pregnant -breast-feeding How should I use this medicine? This drug is usually given as an infusion into a vein. It is administered in a hospital or clinic by a specially trained health care professional. Talk to your pediatrician regarding the use of  this medicine in children. Special care may be needed. Overdosage: If you think you have taken too much of this medicine contact a poison control center or emergency room at once. NOTE: This medicine is only for you. Do not share this medicine with others. What if I miss a dose? It is important not to miss a dose. Call your doctor or health care professional if you are unable to keep an appointment. What may interact with this medicine? -medicines for seizures -medicines to increase blood counts like filgrastim, pegfilgrastim, sargramostim -some antibiotics like amikacin, gentamicin, neomycin, streptomycin, tobramycin -vaccines Talk to your doctor or health care professional before taking any of these medicines: -acetaminophen -aspirin -ibuprofen -ketoprofen -naproxen This list may not describe all possible interactions. Give your health care provider a list of all the medicines, herbs, non-prescription drugs, or dietary supplements you use. Also tell them if you smoke, drink alcohol, or use illegal drugs. Some items may interact with your medicine. What should I watch for while using this medicine? Your condition will be monitored carefully while you are receiving this medicine. You will need important blood work done while  you are taking this medicine. This drug may make you feel generally unwell. This is not uncommon, as chemotherapy can affect healthy cells as well as cancer cells. Report any side effects. Continue your course of treatment even though you feel ill unless your doctor tells you to stop. In some cases, you may be given additional medicines to help with side effects. Follow all directions for their use. Call your doctor or health care professional for advice if you get a fever, chills or sore throat, or other symptoms of a cold or flu. Do not treat yourself. This drug decreases your body's ability to fight infections. Try to avoid being around people who are sick. This medicine  may increase your risk to bruise or bleed. Call your doctor or health care professional if you notice any unusual bleeding. Be careful brushing and flossing your teeth or using a toothpick because you may get an infection or bleed more easily. If you have any dental work done, tell your dentist you are receiving this medicine. Avoid taking products that contain aspirin, acetaminophen, ibuprofen, naproxen, or ketoprofen unless instructed by your doctor. These medicines may hide a fever. Do not become pregnant while taking this medicine. Women should inform their doctor if they wish to become pregnant or think they might be pregnant. There is a potential for serious side effects to an unborn child. Talk to your health care professional or pharmacist for more information. Do not breast-feed an infant while taking this medicine. What side effects may I notice from receiving this medicine? Side effects that you should report to your doctor or health care professional as soon as possible: -allergic reactions like skin rash, itching or hives, swelling of the face, lips, or tongue -signs of infection - fever or chills, cough, sore throat, pain or difficulty passing urine -signs of decreased platelets or bleeding - bruising, pinpoint red spots on the skin, black, tarry stools, nosebleeds -signs of decreased red blood cells - unusually weak or tired, fainting spells, lightheadedness -breathing problems -changes in hearing -changes in vision -chest pain -high blood pressure -low blood counts - This drug may decrease the number of white blood cells, red blood cells and platelets. You may be at increased risk for infections and bleeding. -nausea and vomiting -pain, swelling, redness or irritation at the injection site -pain, tingling, numbness in the hands or feet -problems with balance, talking, walking -trouble passing urine or change in the amount of urine Side effects that usually do not require medical  attention (report to your doctor or health care professional if they continue or are bothersome): -hair loss -loss of appetite -metallic taste in the mouth or changes in taste This list may not describe all possible side effects. Call your doctor for medical advice about side effects. You may report side effects to FDA at 1-800-FDA-1088. Where should I keep my medicine? This drug is given in a hospital or clinic and will not be stored at home. NOTE: This sheet is a summary. It may not cover all possible information. If you have questions about this medicine, talk to your doctor, pharmacist, or health care provider.  2018 [employer]/Gold Standard (2008-02-15 14:38:05) Paclitaxel injection What is this medicine? PACLITAXEL (PAK li TAX el) is a chemotherapy drug. It targets fast dividing cells, like cancer cells, and causes these cells to die. This medicine is used to treat ovarian cancer, breast cancer, and other cancers. This medicine may be used for other purposes; ask your health care provider or  pharmacist if you have questions. COMMON BRAND NAME(S): Onxol, Taxol What should I tell my health care provider before I take this medicine? They need to know if you have any of these conditions: -blood disorders -irregular heartbeat -infection (especially a virus infection such as chickenpox, cold sores, or herpes) -liver disease -previous or ongoing radiation therapy -an unusual or allergic reaction to paclitaxel, alcohol, polyoxyethylated castor oil, other chemotherapy agents, other medicines, foods, dyes, or preservatives -pregnant or trying to get pregnant -breast-feeding How should I use this medicine? This drug is given as an infusion into a vein. It is administered in a hospital or clinic by a specially trained health care professional. Talk to your pediatrician regarding the use of this medicine in children. Special care may be needed. Overdosage: If you think you have taken too much of  this medicine contact a poison control center or emergency room at once. NOTE: This medicine is only for you. Do not share this medicine with others. What if I miss a dose? It is important not to miss your dose. Call your doctor or health care professional if you are unable to keep an appointment. What may interact with this medicine? Do not take this medicine with any of the following medications: -disulfiram -metronidazole This medicine may also interact with the following medications: -cyclosporine -diazepam -ketoconazole -medicines to increase blood counts like filgrastim, pegfilgrastim, sargramostim -other chemotherapy drugs like cisplatin, doxorubicin, epirubicin, etoposide, teniposide, vincristine -quinidine -testosterone -vaccines -verapamil Talk to your doctor or health care professional before taking any of these medicines: -acetaminophen -aspirin -ibuprofen -ketoprofen -naproxen This list may not describe all possible interactions. Give your health care provider a list of all the medicines, herbs, non-prescription drugs, or dietary supplements you use. Also tell them if you smoke, drink alcohol, or use illegal drugs. Some items may interact with your medicine. What should I watch for while using this medicine? Your condition will be monitored carefully while you are receiving this medicine. You will need important blood work done while you are taking this medicine. This medicine can cause serious allergic reactions. To reduce your risk you will need to take other medicine(s) before treatment with this medicine. If you experience allergic reactions like skin rash, itching or hives, swelling of the face, lips, or tongue, tell your doctor or health care professional right away. In some cases, you may be given additional medicines to help with side effects. Follow all directions for their use. This drug may make you feel generally unwell. This is not uncommon, as chemotherapy can  affect healthy cells as well as cancer cells. Report any side effects. Continue your course of treatment even though you feel ill unless your doctor tells you to stop. Call your doctor or health care professional for advice if you get a fever, chills or sore throat, or other symptoms of a cold or flu. Do not treat yourself. This drug decreases your body's ability to fight infections. Try to avoid being around people who are sick. This medicine may increase your risk to bruise or bleed. Call your doctor or health care professional if you notice any unusual bleeding. Be careful brushing and flossing your teeth or using a toothpick because you may get an infection or bleed more easily. If you have any dental work done, tell your dentist you are receiving this medicine. Avoid taking products that contain aspirin, acetaminophen, ibuprofen, naproxen, or ketoprofen unless instructed by your doctor. These medicines may hide a fever. Do not become pregnant while  taking this medicine. Women should inform their doctor if they wish to become pregnant or think they might be pregnant. There is a potential for serious side effects to an unborn child. Talk to your health care professional or pharmacist for more information. Do not breast-feed an infant while taking this medicine. Men are advised not to father a child while receiving this medicine. This product may contain alcohol. Ask your pharmacist or healthcare provider if this medicine contains alcohol. Be sure to tell all healthcare providers you are taking this medicine. Certain medicines, like metronidazole and disulfiram, can cause an unpleasant reaction when taken with alcohol. The reaction includes flushing, headache, nausea, vomiting, sweating, and increased thirst. The reaction can last from 30 minutes to several hours. What side effects may I notice from receiving this medicine? Side effects that you should report to your doctor or health care professional as  soon as possible: -allergic reactions like skin rash, itching or hives, swelling of the face, lips, or tongue -low blood counts - This drug may decrease the number of white blood cells, red blood cells and platelets. You may be at increased risk for infections and bleeding. -signs of infection - fever or chills, cough, sore throat, pain or difficulty passing urine -signs of decreased platelets or bleeding - bruising, pinpoint red spots on the skin, black, tarry stools, nosebleeds -signs of decreased red blood cells - unusually weak or tired, fainting spells, lightheadedness -breathing problems -chest pain -high or low blood pressure -mouth sores -nausea and vomiting -pain, swelling, redness or irritation at the injection site -pain, tingling, numbness in the hands or feet -slow or irregular heartbeat -swelling of the ankle, feet, hands Side effects that usually do not require medical attention (report to your doctor or health care professional if they continue or are bothersome): -bone pain -complete hair loss including hair on your head, underarms, pubic hair, eyebrows, and eyelashes -changes in the color of fingernails -diarrhea -loosening of the fingernails -loss of appetite -muscle or joint pain -red flush to skin -sweating This list may not describe all possible side effects. Call your doctor for medical advice about side effects. You may report side effects to FDA at 1-800-FDA-1088. Where should I keep my medicine? This drug is given in a hospital or clinic and will not be stored at home. NOTE: This sheet is a summary. It may not cover all possible information. If you have questions about this medicine, talk to your doctor, pharmacist, or health care provider.  2018 Elsevier/Gold Standard (2015-09-11 19:58:00)

## 2018-05-05 NOTE — Progress Notes (Signed)
Hematology and Oncology Follow Up Visit  Marcia [name] [id number] 09-25-1951 67 y.o. 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  She really do not well.  She tolerated her last cycle of chemotherapy really with very little difficulty.  There is been no issues with nausea or vomiting.  She has had no rashes.  She had no problems with bowels or bladder.  She is had no leg swelling.  As far as her myeloma is concerned, this is not been a factor for [place].  Currently, her performance status is ECOG 1.     Medications:  Allergies as of 05/05/2018      Reactions   Bactrim [sulfamethoxazole-trimethoprim] Other (See Comments)   ? Renal failure.   Zofran [ondansetron] Shortness Of Breath   "felt like throat was closing"   Clarithromycin Diarrhea, Nausea And Vomiting   Macrodantin [nitrofurantoin] Rash      Medication List        Accurate as of 05/05/18  9:37 AM. Always use your most recent med list.          acetaminophen 500 MG tablet Commonly known as:  TYLENOL Take 500 mg by mouth every 6 (six) hours as needed for mild pain.   dexamethasone 4 MG tablet Commonly known as:  DECADRON Take 4 mg by mouth 2 (two) times daily. Take 2 pills twice daily for 5 days. Start day before chemotherapy.   doxylamine (Sleep) 25 MG tablet Commonly known as:  UNISOM Take 25 mg by mouth at bedtime as needed for sleep.   lidocaine-prilocaine cream Commonly known as:  EMLA Apply to affected area once   LORazepam 0.5 MG tablet Commonly known as:  ATIVAN TAKE 1 TABLET BY MOUTH EVERY 6 HOURS AS NEEDED FOR  NAUSEA/VOMITING   prochlorperazine 10 MG tablet Commonly known as:  COMPAZINE TAKE 1 TABLET (10 MG TOTAL) BY MOUTH EVERY 6 (SIX) HOURS AS NEEDED (NAUSEA OR VOMITING).       Allergies:  Allergies  Allergen Reactions  . Bactrim [Sulfamethoxazole-Trimethoprim] Other (See Comments)    ? Renal failure.  . Zofran [Ondansetron] Shortness Of Breath    "felt like throat was closing"  . Clarithromycin Diarrhea and Nausea And Vomiting  . Macrodantin [Nitrofurantoin] Rash    Past Medical History, Surgical history, Social history, and Family History were reviewed and updated.  Review of Systems: Review of Systems  Constitutional: Negative.   HENT: Negative.   Eyes: Negative.   Respiratory: Negative.   Cardiovascular: Negative.   Gastrointestinal: Negative.   Genitourinary: Negative.  Negative for dysuria.  Musculoskeletal: Negative.   Skin: Negative.   Neurological: Negative.   Endo/Heme/Allergies: Negative.   Psychiatric/Behavioral: Negative.       Physical Exam:  weight is 198 lb 1.9 oz (89.9 kg). Her oral temperature is 98.5 F (36.9 C). Her blood pressure is 107/61 and her pulse is 50 (abnormal). Her oxygen saturation is 94%.   Wt Readings from Last 3 Encounters:  05/05/18 198 lb 1.9 oz (89.9 kg)  04/14/18 207 lb (93.9 kg)  04/02/18 208 lb 15.9 oz (94.8 kg)    Physical Exam  Constitutional: She is oriented to person,  place, and time.  HENT:  Head: Normocephalic and atraumatic.  Mouth/Throat: Oropharynx is clear and moist.  Eyes: Pupils are equal, round, and reactive to light. EOM are normal.  Neck: Normal range of motion.  Cardiovascular: Normal rate, regular rhythm and normal heart sounds.  Pulmonary/Chest: Effort normal and breath sounds normal.  Abdominal: Soft. Bowel sounds are normal.  Musculoskeletal: Normal range of motion. She exhibits no edema, tenderness or deformity.  Lymphadenopathy:    She has no cervical adenopathy.  Neurological: She is alert and  oriented to person, place, and time.  Skin: Skin is warm and dry. No rash noted. No erythema.  Psychiatric: She has a normal mood and affect. Her behavior is normal. Judgment and thought content normal.  Vitals reviewed.    Lab Results  Component Value Date   WBC 14.1 (H) 05/05/2018   HGB 8.1 (L) 05/05/2018   HCT 25.3 (L) 05/05/2018   MCV 108.1 (H) 05/05/2018   PLT 172 05/05/2018   Lab Results  Component Value Date   FERRITIN 338 (H) 07/31/2011   IRON 36 (L) 03/21/2009   TIBC 232 (L) 03/21/2009   UIBC 196 03/21/2009   IRONPCTSAT 16 (L) 03/21/2009   Lab Results  Component Value Date   RETICCTPCT 1.3 03/27/2011   RBC 2.34 (L) 05/05/2018   RETICCTABS 56.4 03/27/2011   Lab Results  Component Value Date   KPAFRELGTCHN 23.1 (H) 02/16/2018   LAMBDASER 15.8 02/16/2018   KAPLAMBRATIO 1.46 02/16/2018   Lab Results  Component Value Date   IGGSERUM 924 02/16/2018   IGA 222 02/16/2018   IGMSERUM 126 02/16/2018   Lab Results  Component Value Date   TOTALPROTELP 5.2 (L) 03/30/2018   ALBUMINELP 3.0 03/30/2018   A1GS 0.2 03/30/2018   A2GS 0.7 03/30/2018   BETS 0.8 03/30/2018   BETA2SER 0.4 07/02/2015   GAMS 0.6 03/30/2018   MSPIKE Not Observed 03/30/2018   SPEI Comment 03/30/2018     Chemistry      Component Value Date/Time   NA 137 05/05/2018 0830   NA 141 07/06/2017 0816   NA 141 01/05/2017 0956   K 3.7 05/05/2018 0830   K 3.6 07/06/2017 0816   K 4.3 01/05/2017 0956   CL 105 05/05/2018 0830   CL 101 07/06/2017 0816   CO2 23 05/05/2018 0830   CO2 28 07/06/2017 0816   CO2 23 01/05/2017 0956   BUN 17 05/05/2018 0830   BUN 28 (H) 07/06/2017 0816   BUN 22.4 01/05/2017 0956   CREATININE 1.40 (H) 05/05/2018 0830   CREATININE 1.6 (H) 07/06/2017 0816   CREATININE 1.4 (H) 01/05/2017 0956      Component Value Date/Time   CALCIUM 9.0 05/05/2018 0830   CALCIUM 9.1 07/06/2017 0816   CALCIUM 9.3 01/05/2017 0956   ALKPHOS 57 05/05/2018 0830   ALKPHOS 58 07/06/2017 0816    ALKPHOS 52 01/05/2017 0956   AST 18 05/05/2018 0830   AST 14 01/05/2017 0956   ALT 21 05/05/2018 0830   ALT 58 (H) 07/06/2017 0816   ALT 13 01/05/2017 0956   BILITOT 0.6 05/05/2018 0830   BILITOT 0.56 01/05/2017 0956      Impression and Plan: Marcia [name] is a very pleasant 67 yo caucasian female with past history of kappa light chain myeloma with stem cell transplant. She has now been diagnosed with locally advanced infiltrating ductal carcinoma of the right breast, ER(-)/PR(-)/HER-2(+), Ki67 of 75%.   This is her fourth and final cycle of neoadjuvant treatment.  With this, we will then plan for a follow-up MRI.  Her renal function is good.  She should be able to have the MRI.  She will then have surgery.  Hopefully, she will have a lumpectomy and not a mastectomy.  I will set the MRI up for about 4 weeks.  We will see her back in about 6 weeks.  [name], MD 6/12/20199:37 AM

## 2018-05-07 ENCOUNTER — Other Ambulatory Visit: Payer: Self-pay | Admitting: Family

## 2018-05-07 ENCOUNTER — Inpatient Hospital Stay: Payer: 59

## 2018-05-07 VITALS — BP 123/63 | HR 68 | Temp 97.5°F | Resp 20

## 2018-05-07 DIAGNOSIS — C50911 Malignant neoplasm of unspecified site of right female breast: Secondary | ICD-10-CM

## 2018-05-07 DIAGNOSIS — C50411 Malignant neoplasm of upper-outer quadrant of right female breast: Secondary | ICD-10-CM | POA: Diagnosis not present

## 2018-05-07 MED ORDER — PEGFILGRASTIM-CBQV 6 MG/0.6ML ~~LOC~~ SOSY
PREFILLED_SYRINGE | SUBCUTANEOUS | Status: AC
  Filled 2018-05-07: qty 0.6

## 2018-05-07 MED ORDER — PEGFILGRASTIM INJECTION 6 MG/0.6ML ~~LOC~~
PREFILLED_SYRINGE | SUBCUTANEOUS | Status: AC
  Filled 2018-05-07: qty 0.6

## 2018-05-07 MED ORDER — PEGFILGRASTIM-CBQV 6 MG/0.6ML ~~LOC~~ SOSY
6.0000 mg | PREFILLED_SYRINGE | Freq: Once | SUBCUTANEOUS | Status: AC
  Administered 2018-05-07: 6 mg via SUBCUTANEOUS

## 2018-05-07 NOTE — Patient Instructions (Signed)
Pegfilgrastim injection What is this medicine? PEGFILGRASTIM (PEG fil gra stim) is a long-acting granulocyte colony-stimulating factor that stimulates the growth of neutrophils, a type of white blood cell important in the body's fight against infection. It is used to reduce the incidence of fever and infection in patients with certain types of cancer who are receiving chemotherapy that affects the bone marrow, and to increase survival after being exposed to high doses of radiation. This medicine may be used for other purposes; ask your health care provider or pharmacist if you have questions. COMMON BRAND NAME(S): Neulasta What should I tell my health care provider before I take this medicine? They need to know if you have any of these conditions: -kidney disease -latex allergy -ongoing radiation therapy -sickle cell disease -skin reactions to acrylic adhesives (On-Body Injector only) -an unusual or allergic reaction to pegfilgrastim, filgrastim, other medicines, foods, dyes, or preservatives -pregnant or trying to get pregnant -breast-feeding How should I use this medicine? This medicine is for injection under the skin. If you get this medicine at home, you will be taught how to prepare and give the pre-filled syringe or how to use the On-body Injector. Refer to the patient Instructions for Use for detailed instructions. Use exactly as directed. Tell your healthcare provider immediately if you suspect that the On-body Injector may not have performed as intended or if you suspect the use of the On-body Injector resulted in a missed or partial dose. It is important that you put your used needles and syringes in a special sharps container. Do not put them in a trash can. If you do not have a sharps container, call your pharmacist or healthcare provider to get one. Talk to your pediatrician regarding the use of this medicine in children. While this drug may be prescribed for selected conditions,  precautions do apply. Overdosage: If you think you have taken too much of this medicine contact a poison control center or emergency room at once. NOTE: This medicine is only for you. Do not share this medicine with others. What if I miss a dose? It is important not to miss your dose. Call your doctor or health care professional if you miss your dose. If you miss a dose due to an On-body Injector failure or leakage, a new dose should be administered as soon as possible using a single prefilled syringe for manual use. What may interact with this medicine? Interactions have not been studied. Give your health care provider a list of all the medicines, herbs, non-prescription drugs, or dietary supplements you use. Also tell them if you smoke, drink alcohol, or use illegal drugs. Some items may interact with your medicine. This list may not describe all possible interactions. Give your health care provider a list of all the medicines, herbs, non-prescription drugs, or dietary supplements you use. Also tell them if you smoke, drink alcohol, or use illegal drugs. Some items may interact with your medicine. What should I watch for while using this medicine? You may need blood work done while you are taking this medicine. If you are going to need a MRI, CT scan, or other procedure, tell your doctor that you are using this medicine (On-Body Injector only). What side effects may I notice from receiving this medicine? Side effects that you should report to your doctor or health care professional as soon as possible: -allergic reactions like skin rash, itching or hives, swelling of the face, lips, or tongue -dizziness -fever -pain, redness, or irritation at site   where injected -pinpoint red spots on the skin -red or dark-brown urine -shortness of breath or breathing problems -stomach or side pain, or pain at the shoulder -swelling -tiredness -trouble passing urine or change in the amount of urine Side  effects that usually do not require medical attention (report to your doctor or health care professional if they continue or are bothersome): -bone pain -muscle pain This list may not describe all possible side effects. Call your doctor for medical advice about side effects. You may report side effects to FDA at 1-800-FDA-1088. Where should I keep my medicine? Keep out of the reach of children. Store pre-filled syringes in a refrigerator between 2 and 8 degrees C (36 and 46 degrees F). Do not freeze. Keep in carton to protect from light. Throw away this medicine if it is left out of the refrigerator for more than 48 hours. Throw away any unused medicine after the expiration date. NOTE: This sheet is a summary. It may not cover all possible information. If you have questions about this medicine, talk to your doctor, pharmacist, or health care provider.  2018 Elsevier/Gold Standard (2016-11-06 12:58:03)  

## 2018-05-09 DIAGNOSIS — G459 Transient cerebral ischemic attack, unspecified: Secondary | ICD-10-CM

## 2018-05-09 HISTORY — DX: Transient cerebral ischemic attack, unspecified: G45.9

## 2018-05-11 ENCOUNTER — Telehealth: Payer: Self-pay | Admitting: *Deleted

## 2018-05-11 ENCOUNTER — Other Ambulatory Visit: Payer: Self-pay | Admitting: *Deleted

## 2018-05-11 ENCOUNTER — Telehealth: Payer: Self-pay | Admitting: Hematology & Oncology

## 2018-05-11 ENCOUNTER — Ambulatory Visit: Payer: 59 | Admitting: Hematology & Oncology

## 2018-05-11 ENCOUNTER — Ambulatory Visit: Payer: 59

## 2018-05-11 ENCOUNTER — Other Ambulatory Visit: Payer: 59

## 2018-05-11 MED ORDER — MAGIC MOUTHWASH: 5.0000 mL | Freq: Four times a day (QID) | ORAL | 3 refills | Status: DC | PRN

## 2018-05-11 NOTE — Telephone Encounter (Signed)
Patient notifying office that she has been admitted to [facility] for "mini strokes". She was admitted June 16th and is being discharged today.  Requested that the patient have her medical records sent to the office for MD review. She agreed.   Dr [name] notified of admission.

## 2018-05-11 NOTE — Telephone Encounter (Signed)
Patient calling stating she needs an appointment to see Dr [name]. She will be discharged today. She states the hospital told her she needs blood, and they want to transfuse her before discharge. She stated she didn't want to stay and would rather get blood with Dr [name].  Explained to the patient that we did not have any appointments for the rest of the week to transfuse blood, and that the process here at this office would take two days. Again, directed patient to get transfused at the hospital before discharge. It was at this time that patient admitted that she had already left the hospital.   Again explained to the patient that we could not transfuse her this week. Reviewed all symptoms with her of anemia. She denied any symptoms and stated she felt fine. Made appointment with her for the first of next week, but reviewed symptoms again and explained that she would need to go to the ER if symptoms presented. She understood.  Laverna Peace NP notified of above situation.

## 2018-05-11 NOTE — Telephone Encounter (Signed)
lmom for pt re hospital follow up appt 6/26 at 0800 am per sch msh

## 2018-05-13 ENCOUNTER — Ambulatory Visit: Payer: 59

## 2018-05-14 ENCOUNTER — Inpatient Hospital Stay (HOSPITAL_COMMUNITY)
Admission: EM | Admit: 2018-05-14 | Discharge: 2018-05-22 | DRG: 176 | Disposition: A | Discharge status: Home / Self Care | Payer: 59 | Attending: Internal Medicine | Admitting: Internal Medicine

## 2018-05-14 ENCOUNTER — Emergency Department (HOSPITAL_COMMUNITY): Payer: 59

## 2018-05-14 ENCOUNTER — Other Ambulatory Visit: Payer: Self-pay

## 2018-05-14 ENCOUNTER — Encounter (HOSPITAL_COMMUNITY): Payer: Self-pay | Admitting: Emergency Medicine

## 2018-05-14 DIAGNOSIS — D6959 Other secondary thrombocytopenia: Secondary | ICD-10-CM | POA: Diagnosis not present

## 2018-05-14 DIAGNOSIS — I82411 Acute embolism and thrombosis of right femoral vein: Secondary | ICD-10-CM | POA: Diagnosis present

## 2018-05-14 DIAGNOSIS — Z79899 Other long term (current) drug therapy: Secondary | ICD-10-CM

## 2018-05-14 DIAGNOSIS — Z90711 Acquired absence of uterus with remaining cervical stump: Secondary | ICD-10-CM

## 2018-05-14 DIAGNOSIS — D649 Anemia, unspecified: Secondary | ICD-10-CM | POA: Diagnosis present

## 2018-05-14 DIAGNOSIS — C50911 Malignant neoplasm of unspecified site of right female breast: Secondary | ICD-10-CM | POA: Diagnosis present

## 2018-05-14 DIAGNOSIS — Z8579 Personal history of other malignant neoplasms of lymphoid, hematopoietic and related tissues: Secondary | ICD-10-CM

## 2018-05-14 DIAGNOSIS — C9001 Multiple myeloma in remission: Secondary | ICD-10-CM

## 2018-05-14 DIAGNOSIS — Z884 Allergy status to anesthetic agent status: Secondary | ICD-10-CM

## 2018-05-14 DIAGNOSIS — I10 Essential (primary) hypertension: Secondary | ICD-10-CM | POA: Diagnosis present

## 2018-05-14 DIAGNOSIS — X58XXXA Exposure to other specified factors, initial encounter: Secondary | ICD-10-CM | POA: Diagnosis present

## 2018-05-14 DIAGNOSIS — Z7982 Long term (current) use of aspirin: Secondary | ICD-10-CM

## 2018-05-14 DIAGNOSIS — D6481 Anemia due to antineoplastic chemotherapy: Secondary | ICD-10-CM | POA: Diagnosis present

## 2018-05-14 DIAGNOSIS — Z9484 Stem cells transplant status: Secondary | ICD-10-CM

## 2018-05-14 DIAGNOSIS — I2602 Saddle embolus of pulmonary artery with acute cor pulmonale: Secondary | ICD-10-CM

## 2018-05-14 DIAGNOSIS — T451X5A Adverse effect of antineoplastic and immunosuppressive drugs, initial encounter: Secondary | ICD-10-CM | POA: Diagnosis present

## 2018-05-14 DIAGNOSIS — R197 Diarrhea, unspecified: Secondary | ICD-10-CM | POA: Diagnosis not present

## 2018-05-14 DIAGNOSIS — R9431 Abnormal electrocardiogram [ECG] [EKG]: Secondary | ICD-10-CM | POA: Diagnosis present

## 2018-05-14 DIAGNOSIS — Z171 Estrogen receptor negative status [ER-]: Secondary | ICD-10-CM

## 2018-05-14 DIAGNOSIS — F329 Major depressive disorder, single episode, unspecified: Secondary | ICD-10-CM | POA: Diagnosis present

## 2018-05-14 DIAGNOSIS — I82432 Acute embolism and thrombosis of left popliteal vein: Secondary | ICD-10-CM | POA: Diagnosis present

## 2018-05-14 DIAGNOSIS — Z8673 Personal history of transient ischemic attack (TIA), and cerebral infarction without residual deficits: Secondary | ICD-10-CM | POA: Diagnosis not present

## 2018-05-14 DIAGNOSIS — B9620 Unspecified Escherichia coli [E. coli] as the cause of diseases classified elsewhere: Secondary | ICD-10-CM | POA: Diagnosis present

## 2018-05-14 DIAGNOSIS — Z9481 Bone marrow transplant status: Secondary | ICD-10-CM

## 2018-05-14 DIAGNOSIS — Z888 Allergy status to other drugs, medicaments and biological substances status: Secondary | ICD-10-CM

## 2018-05-14 DIAGNOSIS — N390 Urinary tract infection, site not specified: Secondary | ICD-10-CM | POA: Diagnosis present

## 2018-05-14 DIAGNOSIS — T50905A Adverse effect of unspecified drugs, medicaments and biological substances, initial encounter: Secondary | ICD-10-CM | POA: Diagnosis not present

## 2018-05-14 DIAGNOSIS — I2699 Other pulmonary embolism without acute cor pulmonale: Principal | ICD-10-CM | POA: Diagnosis present

## 2018-05-14 DIAGNOSIS — R000 Tachycardia, unspecified: Secondary | ICD-10-CM | POA: Diagnosis present

## 2018-05-14 DIAGNOSIS — Z9221 Personal history of antineoplastic chemotherapy: Secondary | ICD-10-CM

## 2018-05-14 DIAGNOSIS — Z1501 Genetic susceptibility to malignant neoplasm of breast: Secondary | ICD-10-CM

## 2018-05-14 DIAGNOSIS — S40021A Contusion of right upper arm, initial encounter: Secondary | ICD-10-CM | POA: Diagnosis present

## 2018-05-14 DIAGNOSIS — R8290 Unspecified abnormal findings in urine: Secondary | ICD-10-CM | POA: Diagnosis present

## 2018-05-14 DIAGNOSIS — E876 Hypokalemia: Secondary | ICD-10-CM | POA: Diagnosis present

## 2018-05-14 DIAGNOSIS — I82441 Acute embolism and thrombosis of right tibial vein: Secondary | ICD-10-CM | POA: Diagnosis present

## 2018-05-14 DIAGNOSIS — Z853 Personal history of malignant neoplasm of breast: Secondary | ICD-10-CM

## 2018-05-14 DIAGNOSIS — I82890 Acute embolism and thrombosis of other specified veins: Secondary | ICD-10-CM | POA: Diagnosis present

## 2018-05-14 DIAGNOSIS — Z825 Family history of asthma and other chronic lower respiratory diseases: Secondary | ICD-10-CM

## 2018-05-14 HISTORY — DX: Multiple myeloma not having achieved remission: C90.00

## 2018-05-14 LAB — I-STAT CHEM 8, ED
BUN: 6 mg/dL (ref 6–20)
Calcium, Ion: 1.11 mmol/L — ABNORMAL LOW (ref 1.15–1.40)
Chloride: 104 mmol/L (ref 101–111)
Creatinine, Ser: 1.3 mg/dL — ABNORMAL HIGH (ref 0.44–1.00)
Glucose, Bld: 102 mg/dL — ABNORMAL HIGH (ref 65–99)
HCT: 20 % — ABNORMAL LOW (ref 36.0–46.0)
HEMOGLOBIN: 6.8 g/dL — AB (ref 12.0–15.0)
POTASSIUM: 3.3 mmol/L — AB (ref 3.5–5.1)
SODIUM: 138 mmol/L (ref 135–145)
TCO2: 21 mmol/L — ABNORMAL LOW (ref 22–32)

## 2018-05-14 LAB — COMPREHENSIVE METABOLIC PANEL
ALK PHOS: 61 U/L (ref 38–126)
ALT: 14 U/L (ref 14–54)
AST: 15 U/L (ref 15–41)
Albumin: 3.4 g/dL — ABNORMAL LOW (ref 3.5–5.0)
Anion gap: 11 (ref 5–15)
BILIRUBIN TOTAL: 0.8 mg/dL (ref 0.3–1.2)
BUN: 7 mg/dL (ref 6–20)
CALCIUM: 8.5 mg/dL — AB (ref 8.9–10.3)
CO2: 22 mmol/L (ref 22–32)
Chloride: 108 mmol/L (ref 101–111)
Creatinine, Ser: 1.43 mg/dL — ABNORMAL HIGH (ref 0.44–1.00)
GFR calc Af Amer: 42 mL/min — ABNORMAL LOW (ref >60)
GFR, EST NON AFRICAN AMERICAN: 36 mL/min — AB (ref >60)
Glucose, Bld: 107 mg/dL — ABNORMAL HIGH (ref 65–99)
POTASSIUM: 3.4 mmol/L — AB (ref 3.5–5.1)
Sodium: 141 mmol/L (ref 135–145)
TOTAL PROTEIN: 5.4 g/dL — AB (ref 6.5–8.1)

## 2018-05-14 LAB — ABO/RH: ABO/RH(D): AB POS

## 2018-05-14 LAB — CBC
HEMATOCRIT: 19.3 % — AB (ref 36.0–46.0)
HEMOGLOBIN: 6 g/dL — AB (ref 12.0–15.0)
MCH: 34.5 pg — ABNORMAL HIGH (ref 26.0–34.0)
MCHC: 31.1 g/dL (ref 30.0–36.0)
MCV: 110.9 fL — ABNORMAL HIGH (ref 78.0–100.0)
Platelets: 75 10*3/uL — ABNORMAL LOW (ref 150–400)
RBC: 1.74 MIL/uL — AB (ref 3.87–5.11)
RDW: 17.2 % — ABNORMAL HIGH (ref 11.5–15.5)
WBC: 16.4 10*3/uL — AB (ref 4.0–10.5)

## 2018-05-14 LAB — URINALYSIS, ROUTINE W REFLEX MICROSCOPIC
Bilirubin Urine: NEGATIVE
GLUCOSE, UA: NEGATIVE mg/dL
KETONES UR: NEGATIVE mg/dL
Nitrite: POSITIVE — AB
PROTEIN: NEGATIVE mg/dL
Specific Gravity, Urine: 1.009 (ref 1.005–1.030)
pH: 5 (ref 5.0–8.0)

## 2018-05-14 LAB — BRAIN NATRIURETIC PEPTIDE: B Natriuretic Peptide: 112.6 pg/mL — ABNORMAL HIGH (ref 0.0–100.0)

## 2018-05-14 LAB — PREPARE RBC (CROSSMATCH)

## 2018-05-14 LAB — TSH: TSH: 1.619 u[IU]/mL (ref 0.350–4.500)

## 2018-05-14 LAB — I-STAT TROPONIN, ED: Troponin i, poc: 0.06 ng/mL (ref 0.00–0.08)

## 2018-05-14 MED ORDER — SODIUM CHLORIDE 0.9% IV SOLUTION: Freq: Once | INTRAVENOUS | Status: DC

## 2018-05-14 MED ORDER — ENOXAPARIN SODIUM 100 MG/ML ~~LOC~~ SOLN: 1.0000 mg/kg | SUBCUTANEOUS | Status: DC

## 2018-05-14 MED ORDER — SODIUM CHLORIDE 0.9% FLUSH
3.0000 mL | Freq: Two times a day (BID) | INTRAVENOUS | Status: DC
  Administered 2018-05-14 – 2018-05-18 (×9): 3 mL via INTRAVENOUS

## 2018-05-14 MED ORDER — LORAZEPAM 0.5 MG PO TABS
0.5000 mg | ORAL_TABLET | Freq: Four times a day (QID) | ORAL | Status: DC | PRN
  Administered 2018-05-16 – 2018-05-20 (×5): 0.5 mg via ORAL
  Filled 2018-05-14 (×6): qty 1

## 2018-05-14 MED ORDER — IOPAMIDOL (ISOVUE-370) INJECTION 76%
100.0000 mL | Freq: Once | INTRAVENOUS | Status: AC
  Administered 2018-05-14: 46 mL via INTRAVENOUS

## 2018-05-14 MED ORDER — ENOXAPARIN SODIUM 100 MG/ML ~~LOC~~ SOLN
90.0000 mg | Freq: Two times a day (BID) | SUBCUTANEOUS | Status: DC
  Administered 2018-05-14 – 2018-05-17 (×7): 90 mg via SUBCUTANEOUS
  Filled 2018-05-14 (×3): qty 1
  Filled 2018-05-14: qty 0.9
  Filled 2018-05-14 (×3): qty 1

## 2018-05-14 MED ORDER — ACETAMINOPHEN 650 MG RE SUPP: 650.0000 mg | Freq: Four times a day (QID) | RECTAL | Status: DC | PRN

## 2018-05-14 MED ORDER — IOPAMIDOL (ISOVUE-370) INJECTION 76%
INTRAVENOUS | Status: AC
  Filled 2018-05-14: qty 100

## 2018-05-14 MED ORDER — ENSURE ENLIVE PO LIQD
237.0000 mL | Freq: Three times a day (TID) | ORAL | Status: DC
  Administered 2018-05-15 – 2018-05-21 (×12): 237 mL via ORAL

## 2018-05-14 MED ORDER — ACETAMINOPHEN 325 MG PO TABS: 650.0000 mg | ORAL_TABLET | Freq: Four times a day (QID) | ORAL | Status: DC | PRN

## 2018-05-14 MED ORDER — SODIUM CHLORIDE 0.9 % IV SOLN
1.0000 g | INTRAVENOUS | Status: DC
  Administered 2018-05-14: 1 g via INTRAVENOUS
  Filled 2018-05-14 (×2): qty 10

## 2018-05-14 MED ORDER — LIDOCAINE-EPINEPHRINE-TETRACAINE (LET) SOLUTION
3.0000 mL | Freq: Once | NASAL | Status: AC
  Administered 2018-05-14: 3 mL via TOPICAL
  Filled 2018-05-14: qty 3

## 2018-05-14 MED ORDER — SODIUM CHLORIDE 0.9 % IV SOLN: 10.0000 mL/h | Freq: Once | INTRAVENOUS | Status: DC

## 2018-05-14 NOTE — ED Triage Notes (Signed)
Patient to ED c/o palpitations starting yesterday, worsening this morning. Patient reports recent stroke in which she received tPA for Monday. She states she was told her blood count was low and she may just need a transfusion. Patient denies CP or any new symptoms. Resp e/u, skin warm/dry.

## 2018-05-14 NOTE — ED Provider Notes (Signed)
[facility] EMERGENCY DEPARTMENT Provider Note   CSN: [id number] Arrival date & time: 05/14/18  0701     History   Chief Complaint Chief Complaint  Patient presents with  . Palpitations    HPI Marcia [name] is a 67 y.o. female.  HPI   Pt is a 67 y/o female with a h/o breast cancer, anemia, hypertension, who presents to the ED today c/o palpitations/rapid heartbeat that began about 3 days ago. Initially pt stated that palpitations were intermittent and would only be present when she was standing up or exerting herself.  However last night symptoms became constant and she has had a persistent feeling of her heart racing. She denies chest pain.  She does report that she has had dyspnea on exertion for the last several days which has progressively worsened.  States that daily activities make her feel sob and even talking makes her feel sob. Denies fevers cough, rhinorrhea, congestion, or other URI sxs. Denies ble swelling, calf pain swelling, abd pain, NV, or urinary sxs.  States that she received her last chemotherapy treatment on 05/05/2018.  She had some diarrhea/dark stools after chemo which she states is common for her and has resolved.  No bloody/tarry stools currently. She also notes that she had a CVA on  05/09/18. She was admitted to the hospital in [place], [place] and received TPA. She denies residual deficits from CVA or any new sxs today. She noted that while admitted in [place] she was noted to have hgb 6.1 She did not receive a blood transfusion but states she had one scheduled with her oncologist on 05/18/18 instead. She was advised to come to the ED if she is symptomatic.   Denies a h/o VTE. No estrogen use.  Oncologist: Dr. Burney Gauze   Past Medical History:  Diagnosis Date  . Anemia   . Cancer [facility])    Multiple Myeloma  . Counseling regarding goals of care 02/02/2018  . Hypertension   . 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 Primary cancer of right breast without evidence of regional lymph node metastasis (N0) ([facility]) 02/01/2018    Patient Active Problem List   Diagnosis Date Noted  . Pulmonary embolism ([place]) 05/14/2018  . Symptomatic anemia 05/14/2018  . Infiltrating duct and lobular carcinoma, right ([place]) 05/14/2018  . 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 Thrombocytopenia due to drugs 05/14/2018  . Abnormal urinalysis 05/14/2018  . Acute lower UTI 03/31/2018  . Acute renal failure (ARF) ([place]) 03/30/2018  . Counseling regarding goals of care 02/02/2018  . Primary cancer of right breast without evidence of regional lymph node metastasis (N0) ([place]) 02/01/2018  . Gout 01/04/2018  . Multiple myeloma in remission ([place]) 06/02/2013  . Essential hypertension 07/30/2007    Past Surgical History:  Procedure Laterality Date  . ABDOMINAL HYSTERECTOMY     partial  . APPENDECTOMY    . BREAST SURGERY     biopsy  . PORTACATH PLACEMENT Left 02/12/2018   Procedure: INSERTION PORT-A-CATH;  Surgeon: Fanny Skates, MD;  Location: [facility];  Service: General;  Laterality: Left;  . TUBAL LIGATION       OB History   None      Home Medications    Prior to Admission medications   Medication Sig Start Date End Date Taking? Authorizing Provider  aspirin EC 81 MG tablet Take 81 mg by mouth daily.  Yes [provider]  doxylamine, Sleep, (UNISOM) 25 MG tablet Take 25 mg by mouth at bedtime as needed for sleep.   Yes [provider]  lidocaine-prilocaine (EMLA) cream Apply to affected area once 02/16/18  Yes Ennever, Rudell Cobb, MD  LORazepam (ATIVAN) 0.5 MG tablet TAKE 1 TABLET BY MOUTH EVERY 6 HOURS AS NEEDED FOR NAUSEA/VOMITING 05/07/18  Yes [place], Holli Humbles, NP  magic mouthwash SOLN Take 5 mLs by mouth 4 (four) times daily as needed for mouth pain. Components benadryl  525 mg, hydrocortisone 60 mg and nystatin 0.6 mg. 240 ml Patient taking  differently: Take 5 mLs by mouth 4 (four) times daily as needed for mouth pain. Components benadryl  525 mg, hydrocortisone 60 mg and nystatin 0.6 mg. 240 ml [place] 05/11/18  Yes Ennever, Rudell Cobb, MD  prochlorperazine (COMPAZINE) 10 MG tablet TAKE 1 TABLET (10 MG TOTAL) BY MOUTH EVERY 6 (SIX) HOURS AS NEEDED (NAUSEA OR VOMITING). 03/23/18  Yes [name], MD  magic mouthwash SOLN Take 5 mLs by mouth 4 (four) times daily. [place] 05/11/18   [provider]    Family History Family History  Problem Relation Age of Onset  . COPD Mother   . COPD Father     Social History Social History   Tobacco Use  . Smoking status: Never Smoker  . Smokeless tobacco: Never Used  . Tobacco comment: never used product  Substance Use Topics  . Alcohol use: Never    Alcohol/week: 0.0 oz    Frequency: Never  . Drug use: Never     Allergies   Bactrim [sulfamethoxazole-trimethoprim]; Zofran [ondansetron]; Clarithromycin; and Macrodantin [nitrofurantoin]   Review of Systems Review of Systems  Constitutional: Negative for chills and fever.  HENT: Negative for ear pain and sore throat.   Eyes: Negative for pain and visual disturbance.  Respiratory: Positive for shortness of breath. Negative for cough and wheezing.   Cardiovascular: Positive for palpitations. Negative for chest pain and leg swelling.  Gastrointestinal: Negative for abdominal pain, blood in stool, constipation, diarrhea, nausea and vomiting.  Genitourinary: Negative for dysuria, hematuria, menstrual problem, urgency and vaginal bleeding.  Musculoskeletal: Negative for arthralgias and back pain.  Skin: Negative for color change and rash.  Neurological: Negative for syncope and headaches.  All other systems reviewed and are negative.    Physical Exam Updated Vital Signs BP 121/73   Pulse 100   Temp 97.9 F (36.6 C) (Oral)   Resp 18   Ht 5' (1.524 m)   Wt 88.2 kg (194 lb 7.1 oz)   SpO2 99%   BMI  37.98 kg/m   Physical Exam  Constitutional: She appears well-developed and well-nourished. No distress.  HENT:  Head: Normocephalic and atraumatic.  Mouth/Throat: Oropharynx is clear and moist.  Eyes: Pupils are equal, round, and reactive to light. EOM are normal.  Pale conjunctiva  Neck: Neck supple.  Cardiovascular: Regular rhythm and intact distal pulses.  No murmur heard. tachycardic  Pulmonary/Chest: Effort normal and breath sounds normal. No stridor. No respiratory distress. She has no wheezes. She has no rales.  Abdominal: Soft. Bowel sounds are normal. She exhibits no distension. There is no tenderness. There is no guarding.  Musculoskeletal:  Trace edema to BLE, no erythema or calf TTP  Neurological: She is alert.  Skin: Skin is warm and dry. Capillary refill takes less than 2 seconds.  Psychiatric: She has a normal mood and affect.  Nursing note and vitals reviewed.  ED Treatments / Results  Labs (all labs ordered are listed, but only abnormal results are displayed) Labs Reviewed  CBC - Abnormal; Notable for the following components:      Result Value   WBC 16.4 (*)    RBC 1.74 (*)    Hemoglobin 6.0 (*)    HCT 19.3 (*)    MCV 110.9 (*)    MCH 34.5 (*)    RDW 17.2 (*)    Platelets 75 (*)    All other components within normal limits  URINALYSIS, ROUTINE W REFLEX MICROSCOPIC - Abnormal; Notable for the following components:   Hgb urine dipstick SMALL (*)    Nitrite POSITIVE (*)    Leukocytes, UA TRACE (*)    Bacteria, UA FEW (*)    All other components within normal limits  BRAIN NATRIURETIC PEPTIDE - Abnormal; Notable for the following components:   B Natriuretic Peptide 112.6 (*)    All other components within normal limits  COMPREHENSIVE METABOLIC PANEL - Abnormal; Notable for the following components:   Potassium 3.4 (*)    Glucose, Bld 107 (*)    Creatinine, Ser 1.43 (*)    Calcium 8.5 (*)    Total Protein 5.4 (*)    Albumin 3.4 (*)    GFR calc  non Af Amer 36 (*)    GFR calc Af Amer 42 (*)    All other components within normal limits  I-STAT CHEM 8, ED - Abnormal; Notable for the following components:   Potassium 3.3 (*)    Creatinine, Ser 1.30 (*)    Glucose, Bld 102 (*)    Calcium, Ion 1.11 (*)    TCO2 21 (*)    Hemoglobin 6.8 (*)    HCT 20.0 (*)    All other components within normal limits  URINE CULTURE  TSH  HIV ANTIBODY (ROUTINE TESTING)  BASIC METABOLIC PANEL  I-STAT TROPONIN, ED  TYPE AND SCREEN  ABO/RH  PREPARE RBC (CROSSMATCH)    EKG EKG Interpretation  Date/Time:  Friday May 14 2018 07:07:17 EDT Ventricular Rate:  124 PR Interval:  154 QRS Duration: 66 QT Interval:  314 QTC Calculation: 451 R Axis:   118 Text Interpretation:  Sinus tachycardia Right axis deviation Cannot rule out Anterior infarct , age undetermined Abnormal ECG Confirmed by Nat Christen [phone]) on 05/14/2018 11:03:39 AM   Radiology Dg Chest 2 View  Result Date: 05/14/2018 CLINICAL DATA:  Cardiac palpitations.  History of breast carcinoma EXAM: CHEST - 2 VIEW COMPARISON:  February 12, 2018 FINDINGS: Port-A-Cath tip is in the superior vena cava. No pneumothorax. There is no edema or consolidation. The heart size and pulmonary vascularity are normal. No adenopathy. Bones appear osteoporotic. No blastic or lytic bone lesions. IMPRESSION: Port-A-Cath tip in superior vena cava. No pneumothorax. No edema or consolidation. No evident adenopathy. Electronically Signed   By: Lowella Grip III M.D.   On: 05/14/2018 07:51   Ct Angio Chest Pe W And/or Wo Contrast  Result Date: 05/14/2018 CLINICAL DATA:  c/o palpitations starting yesterday, worsening this morning. Patient reports recent stroke in which she received TPA on Monday. She states she was told her blood count was low and she may just need a transfusion. EXAM: CT ANGIOGRAPHY CHEST WITH CONTRAST TECHNIQUE: Multidetector CT imaging of the chest was performed using the standard protocol during  bolus administration of intravenous contrast. Multiplanar CT image reconstructions and MIPs were obtained to evaluate the vascular anatomy. CONTRAST:  11m ISOVUE-370 IOPAMIDOL (ISOVUE-370) INJECTION 76%  COMPARISON:  Chest x-ray 05/14/2018 FINDINGS: Cardiovascular: Heart size is normal. The RV: LV ratio is normal (0.89). There is no pericardial effusion. There is minimal atherosclerotic calcification of the coronary arteries. Thoracic aorta is normal in appearance. The pulmonary arteries are well opacified by contrast bolus. There are pulmonary emboli within the RIGHT main pulmonary artery extending into the RIGHT LOWER lobe and RIGHT middle lobe branches. LEFT pulmonary arteries are normally opacified. LEFT-sided Port-A-Cath tip to the LOWER superior vena cava. Mediastinum/Nodes: The visualized portion of the thyroid gland has a normal appearance. The esophagus is normal in appearance. No mediastinal, hilar, or axillary adenopathy. Lungs/Pleura: There is bibasilar atelectasis. Smooth septal wall thickening and diffuse ground-glass opacities are consistent with mild pulmonary edema. Upper Abdomen: No acute abnormality. Musculoskeletal: No chest wall abnormality. No acute or significant osseous findings. Review of the MIP images confirms the above findings. IMPRESSION: The study is positive for pulmonary embolus. There is no evidence for RIGHT heart strain. Bilateral airspace filling and septal thickening consistent with mild edema. Critical Value/emergent results were called by telephone at the time of interpretation on 05/14/2018 at 10:38 am to [facility] , who verbally acknowledged these results. Electronically Signed   By: Nolon Nations M.D.   On: 05/14/2018 10:41    Procedures Procedures (including critical care time) CRITICAL CARE Performed by: Rodney Booze   Total critical care time:33 minutes  Critical care time was exclusive of separately billable procedures and treating other  patients.  Critical care was necessary to treat or prevent imminent or life-threatening deterioration.  Critical care was time spent personally by me on the following activities: development of treatment plan with patient and/or surrogate as well as nursing, discussions with consultants, evaluation of patient's response to treatment, examination of patient, obtaining history from patient or surrogate, ordering and performing treatments and interventions, ordering and review of laboratory studies, ordering and review of radiographic studies, pulse oximetry and re-evaluation of patient's condition.   Medications Ordered in ED Medications  iopamidol (ISOVUE-370) 76 % injection (has no administration in time range)  iopamidol (ISOVUE-370) 76 % injection (has no administration in time range)  0.9 %  sodium chloride infusion (Manually program via Guardrails IV Fluids) (has no administration in time range)  LORazepam (ATIVAN) tablet 0.5 mg (has no administration in time range)  sodium chloride flush (NS) 0.9 % injection 3 mL (has no administration in time range)  acetaminophen (TYLENOL) tablet 650 mg (has no administration in time range)    Or  acetaminophen (TYLENOL) suppository 650 mg (has no administration in time range)  enoxaparin (LOVENOX) injection 90 mg (90 mg Subcutaneous Given 05/14/18 1305)  cefTRIAXone (ROCEPHIN) 1 g in sodium chloride 0.9 % 100 mL IVPB (has no administration in time range)  feeding supplement (ENSURE ENLIVE) (ENSURE ENLIVE) liquid 237 mL (237 mLs Oral Not Given 05/14/18 1904)  lidocaine-EPINEPHrine-tetracaine (LET) solution (3 mLs Topical Given 05/14/18 0830)  iopamidol (ISOVUE-370) 76 % injection 100 mL (46 mLs Intravenous Contrast Given 05/14/18 0945)     Initial Impression / Assessment and Plan / ED Course  I have reviewed the triage vital signs and the nursing notes.  Pertinent labs & imaging results that were available during my care of the patient were reviewed by  me and considered in my medical decision making (see chart for details).  Discussed pt presentation and exam findings with Dr. Lacinda Axon, who evaluated the pt and agrees with the workup and plan for admission.   10:47 AM CONSULT with  Erin Hearing, NP from hospitalist service who will admit the patient to the hospitalist team.  11:00 AM Rechecked pt. Communicated findings of CT scan, labs, and plan for admission. Pt agrees with plan. NAD at this time with stable VS. HR 90s. BP 811B systolic on monitor. Normal O2 sats and respirations. No tachypnea.  Final Clinical Impressions(s) / ED Diagnoses   Final diagnoses:  Anemia, unspecified type  Acute pulmonary embolism without acute cor pulmonale, unspecified pulmonary embolism type ([place])   Pt presenting with palpitations and dyspnea. Initially tachycardic with soft but stable BP. Afebrile with otherwise normal VS.   Recent admission in [place], for CVA with TPA administration and low hgb s/p TPA. Refused transfusion during admission due to being asymptomatic. No bloody/melenotic stools. No vaginal bleeding or other source of bleeding reported, however has become symptomatic.   Found to have anemia with hgb of 6. two units PRBC ordered and initiated in the ED for symptomatic anemia.   Remainder of labs significant for leukocytosis to 16.4. CMP with stable elevation in Cr, normal LFTs and no gross electrolyte derangement. BNP grossly normal. Trop negative. UA positive for nitrites and leukocytes, pt asymptomatic. ECG With sinus tach, with right axis deviation and possible anterior infarct. CXR is without consolidation or edema.   CTA positive for PE as well as some mild edema, no evidence of right heart strain.   Will consult hospitalist for admission for further tx.   ED Discharge Orders    None       Bishop Dublin 05/14/18 1926    Nat Christen, MD 05/17/18 2049

## 2018-05-14 NOTE — ED Triage Notes (Signed)
Patient has port-a-cath, would like blood to be drawn from it.

## 2018-05-14 NOTE — ED Notes (Signed)
LET solution applied to port-a-cath site.

## 2018-05-14 NOTE — ED Notes (Signed)
Placed purewick on pt, tolerated well. Pt was informed of device, has had history of using device before.

## 2018-05-14 NOTE — ED Notes (Signed)
Attempted to call report to floor 

## 2018-05-14 NOTE — Care Management Note (Addendum)
Case Management Note  Patient Details  Name: Marcia [name] MRN: [id number] Date of Birth: 02-Nov-1951  Subjective/Objective:   From home, presents with malignancy induced pe, will need lovenox at dc. She has [employer] which covers medications, RN is starting education on self injections with lovenox.     6/27 Tomi Bamberger RN, BSN - issue with thrombocytopenia plts of 27 with r arm hematoma and anemia, oncology following, checking for HIT. Hope to dc by the weekend.               Action/Plan: DC home when ready.  Expected Discharge Date:  05/15/18               Expected Discharge Plan:  Home/Self Care  In-House Referral:     Discharge planning Services  CM Consult  Post Acute Care Choice:    Choice offered to:     DME Arranged:    DME Agency:     HH Arranged:    HH Agency:     Status of Service:  In process, will continue to follow  If discussed at Long Length of Stay Meetings, dates discussed:    Additional Comments:  Zenon Mayo, RN 05/14/2018, 5:37 PM

## 2018-05-14 NOTE — H&P (Addendum)
History and Physical    CASONDRA GASCA GNF:[id number] DOB: [birth year] DOA: 05/14/2018  **Will place patient in observation status based on the expectation that the patient will need hospitalization/ hospital care for less than or equal to 24 hours  PCP: Jilda Panda, MD   Attending physician: Jonnie Finner  Patient coming from/Resides with:  private residence  Chief Complaint: Heart palpitations, fatigue and shortness of breath  HPI: Marcia [name] is a 67 y.o. 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  She completed her fourth and final cycle of neoadjuvant treatment on 6/12.  Patient does have issues with recurrent anemia in context of receipt of chemotherapy which gradually improves.  Last documented hemoglobin on 6/12 was 8.1.  Patient was recently discharged on 6/18 from [facility] in [place] after admission for ischemic stroke status post tPA in the ER on 6/16 under the direction of [facility] tele-neurologist.  On 6/17 her hemoglobin was 6.8 noting her hemoglobin on the previous day was 8.2.  On date of discharge her hemoglobin was 6.1 platelet count was 84,000.  Transfusion was recommended but patient refused since she was asymptomatic and informed discharging physician to plan to follow-up with Dr. [name] and pursue transfusion if indicated.  Patient had been doing well until 2 days ago on Wednesday when she developed abrupt sensation of tachypalpitations shortness of breath and generalized fatigue.  She had previously contacted oncology office but was informed she would not be able to receive office evaluation and likely transfusion until the next week and since she was asymptomatic was informed that if she develops symptoms to report to the ER.  Because of current symptoms she presented to  the ER.  She denies dark or bloody stools.  She states she typically has dark stools during the week of chemo but has since resolved.  Because of her sudden onset of symptoms with tachycardia and shortness of breath and underlying malignancy EDP was concerned over possible PE.  Right-sided multiple PE without any right heart strain.  Platelets are low at 75,000 but at this juncture we have opted to initiate low molecular weight heparin to treat malignant related PE.  Of note she is not hypoxemic at rest.   ED Course:  Vital Signs: BP 111/66   Pulse 94   Temp 98.2 F (36.8 C) (Oral)   Resp 19   SpO2 100%   CTA chest: Pulmonary emboli within the right main pulmonary artery extending into the right lower lobe and right middle lobe branches, left pulmonary arteries are normally opacified, no right heart strain CXR: Negative Lab data: 41, potassium 3.4, chloride 108, CO2 22, glucose 107, BUN 7, creatinine 1.43, anion gap 11, LFTs normal, BNP 113, poc troponin 0 0.06, white count 16,400 obtained, hemoglobin 6.0, MCV 111, platelets 75,000, urinalysis with small hemoglobin, trace leukocytes, positive nitrite, few bacteria, WBCs 11-20; urine culture pending Medications and treatments:None  Review of Systems:  In addition to the HPI above,  No Fever-chills, myalgias or other constitutional symptoms No Headache, changes with Vision or hearing, new weakness, tingling, numbness in any extremity, dizziness, dysarthria or word finding difficulty, gait disturbance or imbalance, tremors or seizure activity No problems swallowing food or Liquids, indigestion/reflux, choking or coughing while eating, abdominal pain with or after eating No Chest pain, Cough, orthopnea  No Abdominal pain, N/V, melena,hematochezia, dark tarry stools, constipation  No dysuria, malodorous urine, hematuria or flank pain No new skin rashes, lesions, masses or bruises, No new joint pains, aches, swelling or redness No recent  unintentional weight gain or loss No polyuria, polydypsia or polyphagia   Past Medical History:  Diagnosis Date  . Anemia   . Cancer [facility])    Multiple Myeloma  . Counseling regarding goals of care 02/02/2018  . Hypertension   . 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 Primary cancer of right breast without evidence of regional lymph node metastasis (N0) ([place]) 02/01/2018    Past Surgical History:  Procedure Laterality Date  . ABDOMINAL HYSTERECTOMY     partial  . APPENDECTOMY    . BREAST SURGERY     biopsy  . PORTACATH PLACEMENT Left 02/12/2018   Procedure: INSERTION PORT-A-CATH;  Surgeon: Fanny Skates, MD;  Location: [facility];  Service: General;  Laterality: Left;  . TUBAL LIGATION      Social History   Socioeconomic History  . Marital status: Married    Spouse name: Not on file  . Number of children: Not on file  . Years of education: Not on file  . Highest education level: Not on file  Occupational History  . Not on file  Social Needs  . Financial resource strain: Not on file  . Food insecurity:    Worry: Not on file    Inability: Not on file  . Transportation needs:    Medical: Not on file    Non-medical: Not on file  Tobacco Use  . Smoking status: Never Smoker  . Smokeless tobacco: Never Used  . Tobacco comment: never used product  Substance and Sexual Activity  . Alcohol use: Never    Alcohol/week: 0.0 oz    Frequency: Never  . Drug use: Never  . Sexual activity: Not on file  Lifestyle  . Physical activity:    Days per week: Not on file    Minutes per session: Not on file  . Stress: Not on file  Relationships  . Social connections:    Talks on phone: Not on file    Gets together: Not on file    Attends religious service: Not on file    Active member of club or organization: Not on file    Attends meetings of clubs or organizations: Not on file    Relationship status: Not on file  . Intimate partner violence:    Fear of  current or ex partner: Not on file    Emotionally abused: Not on file    Physically abused: Not on file    Forced sexual activity: Not on file  Other Topics Concern  . Not on file  Social History Narrative  . Not on file    Mobility: Independent Work history: Not obtained   Allergies  Allergen Reactions  . Bactrim [Sulfamethoxazole-Trimethoprim] Other (See Comments)    ? Renal failure.  . Zofran [Ondansetron] Shortness Of Breath    "felt like throat was closing"  . Clarithromycin Diarrhea and Nausea And Vomiting  . Macrodantin [Nitrofurantoin] Rash    Family History  Problem Relation Age of Onset  . COPD Mother   . COPD Father      Prior to Admission medications   Medication Sig Start Date End Date Taking? Authorizing Provider  aspirin EC 81 MG tablet Take 81 mg by mouth daily.   Yes [provider]  doxylamine, Sleep, (UNISOM) 25 MG tablet Take 25  mg by mouth at bedtime as needed for sleep.   Yes [provider]  lidocaine-prilocaine (EMLA) cream Apply to affected area once 02/16/18  Yes Ennever, Rudell Cobb, MD  LORazepam (ATIVAN) 0.5 MG tablet TAKE 1 TABLET BY MOUTH EVERY 6 HOURS AS NEEDED FOR NAUSEA/VOMITING 05/07/18  Yes [place], Holli Humbles, NP  magic mouthwash SOLN Take 5 mLs by mouth 4 (four) times daily as needed for mouth pain. Components benadryl  525 mg, hydrocortisone 60 mg and nystatin 0.6 mg. 240 ml Patient taking differently: Take 5 mLs by mouth 4 (four) times daily as needed for mouth pain. Components benadryl  525 mg, hydrocortisone 60 mg and nystatin 0.6 mg. 240 ml [place] 05/11/18  Yes Ennever, Rudell Cobb, MD  prochlorperazine (COMPAZINE) 10 MG tablet TAKE 1 TABLET (10 MG TOTAL) BY MOUTH EVERY 6 (SIX) HOURS AS NEEDED (NAUSEA OR VOMITING). 03/23/18  Yes [name], MD  magic mouthwash SOLN Take 5 mLs by mouth 4 (four) times daily. [place] 05/11/18   [provider]    Physical Exam: Vitals:   05/14/18 0712 05/14/18  1043 05/14/18 1100  BP: 98/72 96/65 111/66  Pulse: (!) 125 (!) 102 94  Resp: _0 Temp: 98.2 F (36.8 C)    TempSrc: Oral    SpO2: 100% 100% 100%      Constitutional: NAD, calm, comfortable-appears quite pale and older than stated age Eyes: PERRL, lids and conjunctivae normal ENMT: Mucous membranes are moist. Posterior pharynx clear of any exudate or lesions. Neck: normal, supple, no masses, no thyromegaly Respiratory: clear to auscultation bilaterally, no wheezing, no crackles. Normal respiratory effort. No accessory muscle use.  Cardiovascular: Regular rate and rhythm without resting tachycardia , no murmurs / rubs / gallops. No extremity edema. 2+ pedal pulses. No carotid bruits.  Abdomen: no tenderness, no masses palpated. No hepatosplenomegaly. Bowel sounds positive.  Musculoskeletal: no clubbing / cyanosis. No joint deformity upper and lower extremities. Good ROM, no contractures. Normal muscle tone.  Skin: no rashes, lesions, ulcers. No induration; extensive hair loss and patient currently undergoing chemotherapy Neurologic: CN 2-12 grossly intact. Sensation intact, DTR normal. Strength 5/5 x all 4 extremities.  Psychiatric: Normal judgment and insight. Alert and oriented x 3. Normal mood.    Labs on Admission: I have personally reviewed following labs and imaging studies  CBC: Recent Labs  Lab 05/14/18 0925 05/14/18 0941  WBC 16.4*  --   HGB 6.0* 6.8*  HCT 19.3* 20.0*  MCV 110.9*  --   PLT 75*  --    Basic Metabolic Panel: Recent Labs  Lab 05/14/18 0925 05/14/18 0941  NA 141 138  K 3.4* 3.3*  CL 108 104  CO2 22  --   GLUCOSE 107* 102*  BUN 7 6  CREATININE 1.43* 1.30*  CALCIUM 8.5*  --    GFR: CrCl cannot be calculated (Unknown ideal weight.). Liver Function Tests: Recent Labs  Lab 05/14/18 0925  AST 15  ALT 14  ALKPHOS 61  BILITOT 0.8  PROT 5.4*  ALBUMIN 3.4*   No results for input(s): LIPASE, AMYLASE in the last 168 hours. No results for  input(s): AMMONIA in the last 168 hours. Coagulation Profile: No results for input(s): INR, PROTIME in the last 168 hours. Cardiac Enzymes: No results for input(s): CKTOTAL, CKMB, CKMBINDEX, TROPONINI in the last 168 hours. BNP (last 3 results) No results for input(s): PROBNP in the last 8760 hours. HbA1C: No results for input(s): HGBA1C in the  last 72 hours. CBG: No results for input(s): GLUCAP in the last 168 hours. Lipid Profile: No results for input(s): CHOL, HDL, LDLCALC, TRIG, CHOLHDL, LDLDIRECT in the last 72 hours. Thyroid Function Tests: No results for input(s): TSH, T4TOTAL, FREET4, T3FREE, THYROIDAB in the last 72 hours. Anemia Panel: No results for input(s): VITAMINB12, FOLATE, FERRITIN, TIBC, IRON, RETICCTPCT in the last 72 hours. Urine analysis:    Component Value Date/Time   COLORURINE YELLOW 05/14/2018 [place] 05/14/2018 1042   LABSPEC 1.009 05/14/2018 1042   LABSPEC 1.010 05/02/2009 1206   PHURINE 5.0 05/14/2018 1042   GLUCOSEU NEGATIVE 05/14/2018 1042   HGBUR SMALL (A) 05/14/2018 1042   BILIRUBINUR NEGATIVE 05/14/2018 1042   KETONESUR NEGATIVE 05/14/2018 1042   PROTEINUR NEGATIVE 05/14/2018 1042   UROBILINOGEN 0.2 03/24/2009 0039   NITRITE POSITIVE (A) 05/14/2018 1042   LEUKOCYTESUR TRACE (A) 05/14/2018 1042   Sepsis Labs: _0 (procalcitonin:4,lacticidven:4) )No results found for this or any previous visit (from the past 240 hour(s)).   Radiological Exams on Admission: Dg Chest 2 View  Result Date: 05/14/2018 CLINICAL DATA:  Cardiac palpitations.  History of breast carcinoma EXAM: CHEST - 2 VIEW COMPARISON:  February 12, 2018 FINDINGS: Port-A-Cath tip is in the superior vena cava. No pneumothorax. There is no edema or consolidation. The heart size and pulmonary vascularity are normal. No adenopathy. Bones appear osteoporotic. No blastic or lytic bone lesions. IMPRESSION: Port-A-Cath tip in superior vena cava. No pneumothorax. No edema or  consolidation. No evident adenopathy. Electronically Signed   By: Lowella Grip III M.D.   On: 05/14/2018 07:51   Ct Angio Chest Pe W And/or Wo Contrast  Result Date: 05/14/2018 CLINICAL DATA:  c/o palpitations starting yesterday, worsening this morning. Patient reports recent stroke in which she received TPA on Monday. She states she was told her blood count was low and she may just need a transfusion. EXAM: CT ANGIOGRAPHY CHEST WITH CONTRAST TECHNIQUE: Multidetector CT imaging of the chest was performed using the standard protocol during bolus administration of intravenous contrast. Multiplanar CT image reconstructions and MIPs were obtained to evaluate the vascular anatomy. CONTRAST:  68m ISOVUE-370 IOPAMIDOL (ISOVUE-370) INJECTION 76% COMPARISON:  Chest x-ray 05/14/2018 FINDINGS: Cardiovascular: Heart size is normal. The RV: LV ratio is normal (0.89). There is no pericardial effusion. There is minimal atherosclerotic calcification of the coronary arteries. Thoracic aorta is normal in appearance. The pulmonary arteries are well opacified by contrast bolus. There are pulmonary emboli within the RIGHT main pulmonary artery extending into the RIGHT LOWER lobe and RIGHT middle lobe branches. LEFT pulmonary arteries are normally opacified. LEFT-sided Port-A-Cath tip to the LOWER superior vena cava. Mediastinum/Nodes: The visualized portion of the thyroid gland has a normal appearance. The esophagus is normal in appearance. No mediastinal, hilar, or axillary adenopathy. Lungs/Pleura: There is bibasilar atelectasis. Smooth septal wall thickening and diffuse ground-glass opacities are consistent with mild pulmonary edema. Upper Abdomen: No acute abnormality. Musculoskeletal: No chest wall abnormality. No acute or significant osseous findings. Review of the MIP images confirms the above findings. IMPRESSION: The study is positive for pulmonary embolus. There is no evidence for RIGHT heart strain. Bilateral  airspace filling and septal thickening consistent with mild edema. Critical Value/emergent results were called by telephone at the time of interpretation on 05/14/2018 at 10:38 am [facility] verbally acknowledged these results. Electronically Signed   By: ENolon NationsM.D.   On: 05/14/2018 10:41    EKG: (Independently reviewed) 80 with ventricular  rate 124 bpm, QTC 451 ms, right axis deviation without evidence of right bundle branch block, early R wave rotation, small voltage QRSs in lateral leads, no acute ischemic changes  Assessment/Plan Principal Problem:   Pulmonary embolism  -Presents after abrupt onset of tachycardia, generalized weakness and shortness of breath especially with exertion 48 hours prior with CT imaging revealing right sided pulmonary embolism in a patient with underlying malignancy -Begin full dose weight-based Lovenox which will likely need to continue after discharge given malignant state and can be managed as an outpatient by her oncologist -Case management consulted to assist with determining co-pay for Lovenox prior to discharge -Patient reports dyspnea on exertion and is not hypoxemic at rest so we will need to have ambulatory oximetry checked prior to discharge; consider waiting until patient has received blood transfusion and hemoglobin has improved as this may also be influencing dyspnea -No right heart strain seen on CT of the chest so no indication to pursue echocardiogram or cycle troponin at this point  Active Problems:   Symptomatic anemia -Baseline hemoglobin just over 8 with current hemoglobin 6 likely related to recent chemotherapy as well as receipt of tPA 2/2 recent ischemic stroke symptoms on 6/16 -Denies dark or bloody stools or other GI bleed symptoms -For completeness of evaluation check fecal occult blood and monitor closely while on anticoagulation -2 units PRBCs ordered for infusion-repeat CBC after second unit infused -TSH     Infiltrating duct and lobular carcinoma, right -Just completed fourth round of neoadjuvant chemo on 6/12 MRI plan and based on this diagnostic study oncologist documents hopeful she will be able to proceed with lumpectomy as opposed to mastectomy -Follow-up with Dr. [name] after discharge    Thrombocytopenia due to drugs -Documented with thrombocytopenia post tPA with slight decrease in platelet count since discharge on 6/18 -Continue to monitor platelets closely in context of low molecular weight heparin    History of CVA (cerebrovascular accident)-ischemic s/p tPA 05/09/18 w/o residual sx's -No residual symptoms -Pre-admission low-dose aspirin held due to requirement for full dose anticoagulation for bilateral PE     Abnormal urinalysis -Asymptomatic but does have leukocytosis so will begin empiric Rocephin but need to follow-up on urine culture in the event antibiotics are not indicated    **Additional lab, imaging and/or diagnostic evaluation at discretion of supervising physician  DVT prophylaxis: Full dose Lovenox Code Status: Full Family Communication: Husband Disposition Plan: Home Consults called: None    ELLIS,ALLISON L. ANP-BC [facility] Pager [phone]   If 7PM-7AM, please contact night-coverage [email or web address] Password [email or web address]  05/14/2018, 11:46 AM

## 2018-05-14 NOTE — Progress Notes (Signed)
Initial Nutrition Assessment  DOCUMENTATION CODES:   Obesity unspecified  INTERVENTION:    Ensure Enlive po TID, each supplement provides 350 kcal and 20 grams of protein  NUTRITION DIAGNOSIS:   Increased nutrient needs related to cancer and cancer related treatments as evidenced by estimated needs  GOAL:   Patient will meet greater than or equal to 90% of their needs  MONITOR:   PO intake, Supplement acceptance, Labs, Skin, Weight trends, I & O's  REASON FOR ASSESSMENT:   Malnutrition Screening Tool  ASSESSMENT:   67 yo w/ hx of LC myeloma sp stem cell Tx in 2011 and in remission since then, recently diagnosed with breast cancer in Feb 2019 and has been receiving chemoRx just completed 4/4 cycles 1 week ago on Friday. Presented to ED where CTA of chest showing pulm emboli in the R lung with normal RV/LV ratio.  RD spoke with patient at bedside. Husband present in room. Pt reports her appetite isn't good. She is undergoing chemotherapy. Taste buds are being affected.  She was consuming 3 small meals per day PTA. Drinking 1-2 Ensure supplements per day.  Pt endorses an approximate 30 lb weight loss since starting chemotherapy.  Per readings below, pt has had a 12% weight loss since 01/2018. Labs and medications reviewed. K 3.4 (L).   Pt shares she was using sugar-free mints to help "wake up her taste buds".  NUTRITION - FOCUSED PHYSICAL EXAM:  Unable to complete at this time.  Diet Order:   Diet Order           Diet regular Room service appropriate? Yes; Fluid consistency: Thin  Diet effective now         EDUCATION NEEDS:   No education needs have been identified at this time  Skin:  Skin Assessment: Reviewed RN Assessment  Last BM:  6/21  Height:   Ht Readings from Last 1 Encounters:  05/14/18 5' (1.524 m)   Weight:   Wt Readings from Last 1 Encounters:  05/14/18 194 lb 7.1 oz (88.2 kg)   Wt Readings from Last 10 Encounters:  05/14/18 194 lb 7.1  oz (88.2 kg)  05/05/18 198 lb 1.9 oz (89.9 kg)  04/14/18 207 lb (93.9 kg)  04/02/18 208 lb 15.9 oz (94.8 kg)  03/30/18 205 lb 8 oz (93.2 kg)  03/09/18 212 lb (96.2 kg)  02/16/18 220 lb (99.8 kg)  02/01/18 218 lb (98.9 kg)  01/04/18 217 lb 12.8 oz (98.8 kg)  07/06/17 213 lb (96.6 kg)   BMI:  Body mass index is 37.98 kg/m.  Estimated Nutritional Needs:   Kcal:  2100-2300  Protein:  105-120 gm  Fluid:  2.1-2.3 L  Arthur Holms, RD, LDN Pager #: [phone] After-Hours Pager #: [phone]

## 2018-05-14 NOTE — Progress Notes (Signed)
Patient arrived to unit via stretcher, alert and oriented X 4.  She was oriented to the unit and has no current pain.  Patient in bed in low position, with call bell.  Lovenox instructed and patient was able to observe demonstration.  She is aware she will need to be able to return demonstration, and she says her husband is able to do this as well but he was not present for this instruction.

## 2018-05-15 ENCOUNTER — Observation Stay (HOSPITAL_COMMUNITY): Payer: 59

## 2018-05-15 ENCOUNTER — Observation Stay (HOSPITAL_BASED_OUTPATIENT_CLINIC_OR_DEPARTMENT_OTHER): Payer: 59

## 2018-05-15 DIAGNOSIS — I8290 Acute embolism and thrombosis of unspecified vein: Secondary | ICD-10-CM

## 2018-05-15 DIAGNOSIS — C50911 Malignant neoplasm of unspecified site of right female breast: Secondary | ICD-10-CM | POA: Diagnosis not present

## 2018-05-15 DIAGNOSIS — I2699 Other pulmonary embolism without acute cor pulmonale: Secondary | ICD-10-CM | POA: Diagnosis not present

## 2018-05-15 DIAGNOSIS — D649 Anemia, unspecified: Secondary | ICD-10-CM | POA: Diagnosis not present

## 2018-05-15 DIAGNOSIS — T50905A Adverse effect of unspecified drugs, medicaments and biological substances, initial encounter: Secondary | ICD-10-CM | POA: Diagnosis not present

## 2018-05-15 DIAGNOSIS — D6959 Other secondary thrombocytopenia: Secondary | ICD-10-CM | POA: Diagnosis not present

## 2018-05-15 LAB — BASIC METABOLIC PANEL
ANION GAP: 9 (ref 5–15)
BUN: 6 mg/dL (ref 6–20)
CALCIUM: 8.1 mg/dL — AB (ref 8.9–10.3)
CO2: 24 mmol/L (ref 22–32)
Chloride: 108 mmol/L (ref 101–111)
Creatinine, Ser: 1.17 mg/dL — ABNORMAL HIGH (ref 0.44–1.00)
GFR, EST AFRICAN AMERICAN: 54 mL/min — AB (ref >60)
GFR, EST NON AFRICAN AMERICAN: 46 mL/min — AB (ref >60)
GLUCOSE: 93 mg/dL (ref 65–99)
Potassium: 3.3 mmol/L — ABNORMAL LOW (ref 3.5–5.1)
Sodium: 141 mmol/L (ref 135–145)

## 2018-05-15 LAB — HEMOGLOBIN AND HEMATOCRIT, BLOOD
HEMATOCRIT: 28.5 % — AB (ref 36.0–46.0)
Hemoglobin: 9.4 g/dL — ABNORMAL LOW (ref 12.0–15.0)

## 2018-05-15 LAB — HIV ANTIBODY (ROUTINE TESTING W REFLEX): HIV SCREEN 4TH GENERATION: NONREACTIVE

## 2018-05-15 LAB — ECHOCARDIOGRAM COMPLETE
Height: 60 in
WEIGHTICAEL: 3111.13 [oz_av]

## 2018-05-15 MED ORDER — POTASSIUM CHLORIDE CRYS ER 20 MEQ PO TBCR
40.0000 meq | EXTENDED_RELEASE_TABLET | Freq: Once | ORAL | Status: AC
  Administered 2018-05-15: 40 meq via ORAL
  Filled 2018-05-15: qty 2

## 2018-05-15 MED ORDER — ENOXAPARIN (LOVENOX) PATIENT EDUCATION KIT
PACK | Freq: Once | Status: AC
  Administered 2018-05-15: 19:00:00
  Filled 2018-05-15: qty 1

## 2018-05-15 NOTE — Progress Notes (Signed)
*  PRELIMINARY RESULTS* Echocardiogram 2D Echocardiogram has been performed.  Leavy Cella 05/15/2018, 1:49 PM

## 2018-05-15 NOTE — Progress Notes (Signed)
PROGRESS NOTE        PATIENT DETAILS Name: Marcia [name] Age: 67 y.o. Sex: female Date of Birth: February 22, 1951 Admit Date: 05/14/2018 Admitting Physician Roney Jaffe, MD [id number], Carloyn Manner, MD  Brief Narrative: Patient is a 67 y.o. 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  She was subsequently admitted to the hospitalist service.  See below for further details.  Subjective: Feels much better not having chest pain, shortness of breath or palpitations.  Assessment/Plan: Pulmonary embolism: Suspect related to hypercoagulable state of malignancy-continue therapeutic Lovenox as she has active cancer and getting chemotherapy.  Her last chemo was on 6/12-she subsequently developed anemia and thrombocytopenia, begin Lovenox administration education-continue to watch platelet count.  Await echo and lower extremity Dopplers.  Anemia: Suspect related to recent chemotherapy-transfused of PRBC last night-hemoglobin currently stable.  Recheck CBC tomorrow.  Thrombocytopenia: Likely related to recent chemotherapy-platelet count low but still more than 50,000.  Continue with Lovenox.  Recheck CBC tomorrow.  Hypokalemia: Replete and recheck  Asymptomatic bacteriuria: No symptoms-stop Rocephin.  Right breast cancer: Just completed chemo on 6/12-follows with Dr. [name].  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  DVT Prophylaxis: Full dose anticoagulation with Lovenox  Code Status: Full code   Family Communication: Spouse over the phone  Disposition  Plan: Remain inpatient-home most likely tomorrow-if CBC continues to be stable.  Antimicrobial agents: Anti-infectives (From admission, onward)   Start     Dose/Rate Route Frequency Ordered Stop   05/14/18 1200  cefTRIAXone (ROCEPHIN) 1 g in sodium chloride 0.9 % 100 mL IVPB     1 g 200 mL/hr over 30 Minutes Intravenous Every 24 hours 05/14/18 1149        Procedures: None  CONSULTS:  None  Time spent: 25- minutes-Greater than 50% of this time was spent in counseling, explanation of diagnosis, planning of further management, and coordination of care.  MEDICATIONS: Scheduled Meds: . sodium chloride   Intravenous Once  . enoxaparin (LOVENOX) injection  90 mg Subcutaneous Q12H  . feeding supplement (ENSURE ENLIVE)  237 mL Oral TID BM  . sodium chloride flush  3 mL Intravenous Q12H   Continuous Infusions: . cefTRIAXone (ROCEPHIN)  IV Stopped (05/14/18 2115)   PRN Meds:.acetaminophen **OR** acetaminophen, LORazepam   PHYSICAL EXAM: Vital signs: Vitals:   05/15/18 0028 05/15/18 0052 05/15/18 0345 05/15/18 0848  BP: 117/80 121/77 119/75 121/79  Pulse: 94 95 95 99  Resp: 18 18 16 15   Temp: 98.8 F (37.1 C) 98.7 F (37.1 C) 98.5 F (36.9 C) 98.2 F (36.8 C)  TempSrc: Oral Oral Oral Oral  SpO2: 98% 99% 94% 100%  Weight:      Height:       Filed Weights   05/14/18 1222  Weight: 88.2 kg (194 lb 7.1 oz)   Body mass index is 37.98 kg/m.   General appearance :Awake, alert, not in any distress.  Eyes:Pink conjunctiva HEENT: Atraumatic and Normocephalic Neck: supple, no JVD. No cervical lymphadenopathy. No thyromegaly Resp:Good air entry bilaterally, no added  sounds  CVS: S1 S2 regular, no murmurs.  GI: Bowel sounds present, Non tender and not distended with no gaurding, rigidity or rebound.No organomegaly Extremities: B/L Lower Ext shows no edema, both legs are warm to touch Neurology:  speech clear,Non focal, sensation is grossly intact. Psychiatric: Normal judgment  and insight. Alert and oriented x 3. Normal mood. Musculoskeletal:No digital cyanosis Skin:No Rash, warm and dry Wounds:N/A  I have personally reviewed following labs and imaging studies  LABORATORY DATA: CBC: Recent Labs  Lab 05/14/18 0925 05/14/18 0941 05/15/18 0605  WBC 16.4*  --   --   HGB 6.0* 6.8* 9.4*  HCT 19.3* 20.0* 28.5*  MCV 110.9*  --   --   PLT 75*  --   --     Basic Metabolic Panel: Recent Labs  Lab 05/14/18 0925 05/14/18 0941 05/15/18 0605  NA 141 138 141  K 3.4* 3.3* 3.3*  CL 108 104 108  CO2 22  --  24  GLUCOSE 107* 102* 93  BUN 7 6 6   CREATININE 1.43* 1.30* 1.17*  CALCIUM 8.5*  --  8.1*    GFR: Estimated Creatinine Clearance: 44.8 mL/min (A) (by C-G formula based on SCr of 1.17 mg/dL (H)).  Liver Function Tests: Recent Labs  Lab 05/14/18 0925  AST 15  ALT 14  ALKPHOS 61  BILITOT 0.8  PROT 5.4*  ALBUMIN 3.4*   No results for input(s): LIPASE, AMYLASE in the last 168 hours. No results for input(s): AMMONIA in the last 168 hours.  Coagulation Profile: No results for input(s): INR, PROTIME in the last 168 hours.  Cardiac Enzymes: No results for input(s): CKTOTAL, CKMB, CKMBINDEX, TROPONINI in the last 168 hours.  BNP (last 3 results) No results for input(s): PROBNP in the last 8760 hours.  HbA1C: No results for input(s): HGBA1C in the last 72 hours.  CBG: No results for input(s): GLUCAP in the last 168 hours.  Lipid Profile: No results for input(s): CHOL, HDL, LDLCALC, TRIG, CHOLHDL, LDLDIRECT in the last 72 hours.  Thyroid Function Tests: Recent Labs    05/14/18 1127  TSH 1.619    Anemia Panel: No results for input(s): VITAMINB12, FOLATE, FERRITIN, TIBC, IRON, RETICCTPCT in the last 72 hours.  Urine analysis:    Component Value Date/Time   COLORURINE YELLOW 05/14/2018 [place] 05/14/2018 1042   LABSPEC 1.009 05/14/2018 1042   LABSPEC 1.010 05/02/2009 1206   PHURINE 5.0 05/14/2018 1042   GLUCOSEU  NEGATIVE 05/14/2018 1042   HGBUR SMALL (A) 05/14/2018 1042   BILIRUBINUR NEGATIVE 05/14/2018 1042   KETONESUR NEGATIVE 05/14/2018 1042   PROTEINUR NEGATIVE 05/14/2018 1042   UROBILINOGEN 0.2 03/24/2009 0039   NITRITE POSITIVE (A) 05/14/2018 1042   LEUKOCYTESUR TRACE (A) 05/14/2018 1042    Sepsis Labs: Lactic Acid, Venous No results found for: LATICACIDVEN  MICROBIOLOGY: No results found for this or any previous visit (from the past 240 hour(s)).  RADIOLOGY STUDIES/RESULTS: Dg Chest 2 View  Result Date: 05/14/2018 CLINICAL DATA:  Cardiac palpitations.  History of breast carcinoma EXAM: CHEST - 2 VIEW COMPARISON:  February 12, 2018 FINDINGS: Port-A-Cath tip is in the superior vena cava. No pneumothorax. There is no edema or consolidation. The heart size and pulmonary vascularity are normal. No adenopathy. Bones appear osteoporotic. No blastic or lytic bone lesions. IMPRESSION: Port-A-Cath tip in superior vena cava. No pneumothorax. No edema or consolidation. No evident adenopathy. Electronically Signed   By: Lowella Grip III M.D.   On: 05/14/2018 07:51  Ct Angio Chest Pe W And/or Wo Contrast  Result Date: 05/14/2018 CLINICAL DATA:  c/o palpitations starting yesterday, worsening this morning. Patient reports recent stroke in which she received TPA on Monday. She states she was told her blood count was low and she may just need a transfusion. EXAM: CT ANGIOGRAPHY CHEST WITH CONTRAST TECHNIQUE: Multidetector CT imaging of the chest was performed using the standard protocol during bolus administration of intravenous contrast. Multiplanar CT image reconstructions and MIPs were obtained to evaluate the vascular anatomy. CONTRAST:  19mL ISOVUE-370 IOPAMIDOL (ISOVUE-370) INJECTION 76% COMPARISON:  Chest x-ray 05/14/2018 FINDINGS: Cardiovascular: Heart size is normal. The RV: LV ratio is normal (0.89). There is no pericardial effusion. There is minimal atherosclerotic calcification of the coronary  arteries. Thoracic aorta is normal in appearance. The pulmonary arteries are well opacified by contrast bolus. There are pulmonary emboli within the RIGHT main pulmonary artery extending into the RIGHT LOWER lobe and RIGHT middle lobe branches. LEFT pulmonary arteries are normally opacified. LEFT-sided Port-A-Cath tip to the LOWER superior vena cava. Mediastinum/Nodes: The visualized portion of the thyroid gland has a normal appearance. The esophagus is normal in appearance. No mediastinal, hilar, or axillary adenopathy. Lungs/Pleura: There is bibasilar atelectasis. Smooth septal wall thickening and diffuse ground-glass opacities are consistent with mild pulmonary edema. Upper Abdomen: No acute abnormality. Musculoskeletal: No chest wall abnormality. No acute or significant osseous findings. Review of the MIP images confirms the above findings. IMPRESSION: The study is positive for pulmonary embolus. There is no evidence for RIGHT heart strain. Bilateral airspace filling and septal thickening consistent with mild edema. Critical Value/emergent results were called by telephone at the time of interpretation on 05/14/2018 at 10:38 am to [facility] , who verbally acknowledged these results. Electronically Signed   By: Nolon Nations M.D.   On: 05/14/2018 10:41     LOS: 0 days   Oren Binet, MD  [facility]  If 7PM-7AM, please contact night-coverage  Please page via [email or web address]-Password [email or web address]-click on MD name and type text message  05/15/2018, 11:13 AM

## 2018-05-15 NOTE — Progress Notes (Signed)
VASCULAR LAB PRELIMINARY  PRELIMINARY  PRELIMINARY  PRELIMINARY  Bilateral lower extremity venous duplex completed.    Preliminary report:  There is age indeterminate DVT noted in the right common femoral, popliteal, posterior tibial, and peroneal veins, and in the left popliteal vein.   Tamecka Milham, RVT 05/15/2018, 1:44 PM

## 2018-05-15 NOTE — Evaluation (Signed)
Physical Therapy Evaluation Patient Details Name: Marcia [name] MRN: [id number] DOB: Jul 17, 1951 Today's Date: 05/15/2018   History of Present Illness  Pt is a 67 y.o. 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 CT angiogram of chest showed pulmonary embolism.  Clinical Impression  Patient evaluated by Physical Therapy with no further acute PT needs identified. Patient presents at baseline with functional mobility. Ambulating 350 feet with no device with supervision. HR 130's, SpO2 98% during ambulation. Educated on exercise recommendations and energy conservation techniques; patient verbalized understanding. No follow-up Physical Therapy or equipment needs. PT is signing off. Thank you for this referral.     Follow Up Recommendations No PT follow up;Supervision - Intermittent    Equipment Recommendations  None recommended by PT    Recommendations for Other Services       Precautions / Restrictions Precautions Precautions: Fall Restrictions Weight Bearing Restrictions: No      Mobility  Bed Mobility Overal bed mobility: Independent                Transfers Overall transfer level: Independent                  Ambulation/Gait Ambulation/Gait assistance: Supervision Gait Distance (Feet): 350 Feet Assistive device: None Gait Pattern/deviations: Step-through pattern;Decreased stride length     General Gait Details: overall steady gait but displays decreased speed. Decreased reciprocal arm swing noted  Stairs            Wheelchair Mobility    Modified Rankin (Stroke Patients Only)       Balance Overall balance assessment: Mild deficits observed, not formally tested                                            Pertinent Vitals/Pain Pain Assessment: No/denies pain    Home Living Family/patient expects to be discharged to:: Private residence Living Arrangements: Spouse/significant other Available Help at Discharge: Family Type of Home: House Home Access: Level entry     Home Layout: One level Home Equipment: Environmental consultant - 2 wheels;Shower seat      Prior Function Level of Independence: Independent               Hand Dominance        Extremity/Trunk Assessment   Upper Extremity Assessment Upper Extremity Assessment: Overall WFL for tasks assessed    Lower Extremity Assessment Lower Extremity Assessment: Overall WFL for tasks assessed    Cervical / Trunk Assessment Cervical / Trunk Assessment: Normal  Communication   Communication: No difficulties  Cognition Arousal/Alertness: Awake/alert Behavior During Therapy: WFL for tasks assessed/performed Overall Cognitive Status: Within Functional Limits for tasks assessed                                        General Comments General comments (skin integrity, edema, etc.): patient husband present    Exercises     Assessment/Plan    PT Assessment Patent does not need any further PT services  PT Problem List         PT Treatment Interventions  PT Goals (Current goals can be found in the Care Plan section)  Acute Rehab PT Goals Patient Stated Goal: go home PT Goal Formulation: All assessment and education complete, DC therapy    Frequency     Barriers to discharge        Co-evaluation               AM-PAC PT "6 Clicks" Daily Activity  Outcome Measure Difficulty turning over in bed (including adjusting bedclothes, sheets and blankets)?: None Difficulty moving from lying on back to sitting on the side of the bed? : None Difficulty sitting down on and standing up from a chair with arms (e.g., wheelchair, bedside commode, etc,.)?: None Help needed moving to and from a bed to chair  (including a wheelchair)?: None Help needed walking in hospital room?: A Little Help needed climbing 3-5 steps with a railing? : A Little 6 Click Score: 22    End of Session Equipment Utilized During Treatment: Gait belt Activity Tolerance: Patient tolerated treatment well Patient left: in chair;with call bell/phone within reach Nurse Communication: Mobility status PT Visit Diagnosis: Unsteadiness on feet (R26.81);Difficulty in walking, not elsewhere classified (R26.2)    Time: 3151-7616 PT Time Calculation (min) (ACUTE ONLY): 24 min   Charges:   PT Evaluation $PT Eval Low Complexity: 1 Low PT Treatments $Therapeutic Activity: 8-22 mins   PT G Codes:        Ellamae Sia, PT, DPT Acute Rehabilitation Services  Pager: [phone]   Willy Eddy 05/15/2018, 2:54 PM

## 2018-05-16 DIAGNOSIS — X58XXXA Exposure to other specified factors, initial encounter: Secondary | ICD-10-CM | POA: Diagnosis present

## 2018-05-16 DIAGNOSIS — I2699 Other pulmonary embolism without acute cor pulmonale: Secondary | ICD-10-CM | POA: Diagnosis present

## 2018-05-16 DIAGNOSIS — D649 Anemia, unspecified: Secondary | ICD-10-CM | POA: Diagnosis present

## 2018-05-16 DIAGNOSIS — I2690 Septic pulmonary embolism without acute cor pulmonale: Secondary | ICD-10-CM

## 2018-05-16 DIAGNOSIS — N390 Urinary tract infection, site not specified: Secondary | ICD-10-CM | POA: Diagnosis present

## 2018-05-16 DIAGNOSIS — Z7982 Long term (current) use of aspirin: Secondary | ICD-10-CM | POA: Diagnosis not present

## 2018-05-16 DIAGNOSIS — I2602 Saddle embolus of pulmonary artery with acute cor pulmonale: Secondary | ICD-10-CM | POA: Diagnosis not present

## 2018-05-16 DIAGNOSIS — D6481 Anemia due to antineoplastic chemotherapy: Secondary | ICD-10-CM | POA: Diagnosis present

## 2018-05-16 DIAGNOSIS — Z9481 Bone marrow transplant status: Secondary | ICD-10-CM | POA: Diagnosis not present

## 2018-05-16 DIAGNOSIS — I82441 Acute embolism and thrombosis of right tibial vein: Secondary | ICD-10-CM | POA: Diagnosis present

## 2018-05-16 DIAGNOSIS — Z8579 Personal history of other malignant neoplasms of lymphoid, hematopoietic and related tissues: Secondary | ICD-10-CM | POA: Diagnosis not present

## 2018-05-16 DIAGNOSIS — R8290 Unspecified abnormal findings in urine: Secondary | ICD-10-CM | POA: Diagnosis not present

## 2018-05-16 DIAGNOSIS — Z853 Personal history of malignant neoplasm of breast: Secondary | ICD-10-CM | POA: Diagnosis not present

## 2018-05-16 DIAGNOSIS — E876 Hypokalemia: Secondary | ICD-10-CM | POA: Diagnosis present

## 2018-05-16 DIAGNOSIS — I82411 Acute embolism and thrombosis of right femoral vein: Secondary | ICD-10-CM | POA: Diagnosis present

## 2018-05-16 DIAGNOSIS — I639 Cerebral infarction, unspecified: Secondary | ICD-10-CM | POA: Diagnosis not present

## 2018-05-16 DIAGNOSIS — Z9221 Personal history of antineoplastic chemotherapy: Secondary | ICD-10-CM | POA: Diagnosis not present

## 2018-05-16 DIAGNOSIS — Z9484 Stem cells transplant status: Secondary | ICD-10-CM | POA: Diagnosis not present

## 2018-05-16 DIAGNOSIS — T451X5A Adverse effect of antineoplastic and immunosuppressive drugs, initial encounter: Secondary | ICD-10-CM | POA: Diagnosis present

## 2018-05-16 DIAGNOSIS — C50911 Malignant neoplasm of unspecified site of right female breast: Secondary | ICD-10-CM | POA: Diagnosis present

## 2018-05-16 DIAGNOSIS — Z888 Allergy status to other drugs, medicaments and biological substances status: Secondary | ICD-10-CM | POA: Diagnosis not present

## 2018-05-16 DIAGNOSIS — Z884 Allergy status to anesthetic agent status: Secondary | ICD-10-CM | POA: Diagnosis not present

## 2018-05-16 DIAGNOSIS — I10 Essential (primary) hypertension: Secondary | ICD-10-CM | POA: Diagnosis present

## 2018-05-16 DIAGNOSIS — I82890 Acute embolism and thrombosis of other specified veins: Secondary | ICD-10-CM | POA: Diagnosis present

## 2018-05-16 DIAGNOSIS — Z90711 Acquired absence of uterus with remaining cervical stump: Secondary | ICD-10-CM | POA: Diagnosis not present

## 2018-05-16 DIAGNOSIS — T50905A Adverse effect of unspecified drugs, medicaments and biological substances, initial encounter: Secondary | ICD-10-CM | POA: Diagnosis not present

## 2018-05-16 DIAGNOSIS — B9620 Unspecified Escherichia coli [E. coli] as the cause of diseases classified elsewhere: Secondary | ICD-10-CM | POA: Diagnosis present

## 2018-05-16 DIAGNOSIS — D6959 Other secondary thrombocytopenia: Secondary | ICD-10-CM | POA: Diagnosis present

## 2018-05-16 DIAGNOSIS — Z8673 Personal history of transient ischemic attack (TIA), and cerebral infarction without residual deficits: Secondary | ICD-10-CM | POA: Diagnosis not present

## 2018-05-16 DIAGNOSIS — Z825 Family history of asthma and other chronic lower respiratory diseases: Secondary | ICD-10-CM | POA: Diagnosis not present

## 2018-05-16 DIAGNOSIS — Z170 Estrogen receptor positive status [ER+]: Secondary | ICD-10-CM | POA: Diagnosis not present

## 2018-05-16 DIAGNOSIS — I82432 Acute embolism and thrombosis of left popliteal vein: Secondary | ICD-10-CM | POA: Diagnosis present

## 2018-05-16 DIAGNOSIS — Z79899 Other long term (current) drug therapy: Secondary | ICD-10-CM | POA: Diagnosis not present

## 2018-05-16 LAB — TYPE AND SCREEN
ABO/RH(D): AB POS
ANTIBODY SCREEN: NEGATIVE
UNIT DIVISION: 0
Unit division: 0

## 2018-05-16 LAB — BPAM RBC
BLOOD PRODUCT EXPIRATION DATE: 201906302359
Blood Product Expiration Date: 201906302359
ISSUE DATE / TIME: 201906211446
ISSUE DATE / TIME: 201906220026
UNIT TYPE AND RH: 8400
Unit Type and Rh: 8400

## 2018-05-16 LAB — CBC
HCT: 29.6 % — ABNORMAL LOW (ref 36.0–46.0)
Hemoglobin: 9.7 g/dL — ABNORMAL LOW (ref 12.0–15.0)
MCH: 33.2 pg (ref 26.0–34.0)
MCHC: 32.8 g/dL (ref 30.0–36.0)
MCV: 101.4 fL — AB (ref 78.0–100.0)
PLATELETS: 51 10*3/uL — AB (ref 150–400)
RBC: 2.92 MIL/uL — ABNORMAL LOW (ref 3.87–5.11)
RDW: 20.7 % — AB (ref 11.5–15.5)
WBC: 10.8 10*3/uL — AB (ref 4.0–10.5)

## 2018-05-16 LAB — BASIC METABOLIC PANEL
ANION GAP: 8 (ref 5–15)
BUN: 11 mg/dL (ref 6–20)
CALCIUM: 8.5 mg/dL — AB (ref 8.9–10.3)
CO2: 24 mmol/L (ref 22–32)
Chloride: 108 mmol/L (ref 101–111)
Creatinine, Ser: 1.15 mg/dL — ABNORMAL HIGH (ref 0.44–1.00)
GFR calc Af Amer: 55 mL/min — ABNORMAL LOW (ref >60)
GFR, EST NON AFRICAN AMERICAN: 47 mL/min — AB (ref >60)
Glucose, Bld: 104 mg/dL — ABNORMAL HIGH (ref 65–99)
POTASSIUM: 3.5 mmol/L (ref 3.5–5.1)
SODIUM: 140 mmol/L (ref 135–145)

## 2018-05-16 LAB — URINE CULTURE

## 2018-05-16 MED ORDER — POTASSIUM CHLORIDE CRYS ER 20 MEQ PO TBCR
40.0000 meq | EXTENDED_RELEASE_TABLET | Freq: Once | ORAL | Status: AC
  Administered 2018-05-16: 40 meq via ORAL
  Filled 2018-05-16: qty 2

## 2018-05-16 MED ORDER — LOPERAMIDE HCL 2 MG PO CAPS
4.0000 mg | ORAL_CAPSULE | Freq: Once | ORAL | Status: AC
  Administered 2018-05-16: 4 mg via ORAL
  Filled 2018-05-16: qty 2

## 2018-05-16 MED ORDER — SODIUM CHLORIDE 0.9 % IV SOLN
1.0000 g | INTRAVENOUS | Status: AC
  Administered 2018-05-16 – 2018-05-18 (×3): 1 g via INTRAVENOUS
  Filled 2018-05-16 (×4): qty 10

## 2018-05-16 NOTE — Progress Notes (Signed)
Pt with decreased pedal pulse in left foot.  Pt states since her stoke this has been her baseline.   Cap refill < 3 sec. Pt denies change in sensation or temp to this extremity.  No change in color noted.  Foot is slightly cool to touch.  Will report to pm RN and monitor.  Instructed pt to report any pain, change in sensation.   Right upper arm bruising - pt requested warm or cool packs earlier.  MD approved verbally.  Pt states warm pack helped discomfort.  Area is less tender.  Bruising has extended slightly beyond marked area.    Will report to oncoming RN and monitor.

## 2018-05-16 NOTE — Plan of Care (Signed)
No signs of respiratory distress or decline noted this shift.  Appetite good.  Pain well controlled.

## 2018-05-16 NOTE — Progress Notes (Signed)
Pt up to restroom, returned to bed.  States she feels "weak and funny."  VSS - HR 110, BP 100/66.  After rest, states she feels much better and is attributing feelings to anxiety and possible over-exertion.  Has requested PRN Ativan (existing order).  Will continue to monitor.  Husband at bedside.   Have instructed pt to call prior to going to restroom alone.

## 2018-05-16 NOTE — Progress Notes (Addendum)
PROGRESS NOTE        PATIENT DETAILS Name: Marcia [name] Age: 67 y.o. Sex: female Date of Birth: 08-24-1951 Admit Date: 05/14/2018 Admitting Physician Roney Jaffe, MD [id number], Carloyn Manner, MD  Brief Narrative: Patient is a 67 y.o. 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  She was subsequently admitted to the hospitalist service.  See below for further details.  Subjective:  Patient in bed, appears comfortable, denies any headache, no fever, no chest pain or pressure, no shortness of breath , no abdominal pain. No focal weakness.  Assessment/Plan:  Pulmonary embolism: In a patient with recent history of breast cancer undergoing chemotherapy, continue Lovenox.  Lower extremity ultrasound confirms right leg extensive DVT, echocardiogram stable no evidence of heart strain.  Due to ongoing thrombocytopenia closely monitor for another day or 2 until platelet counts stabilized.  No signs of active bleeding.  Anemia of chronic disease along with marrow suppression due to chemo: Suspect related to recent chemotherapy-transfused of PRBC last night-hemoglobin currently stable.  Able H&H for now will continue to monitor.  Thrombocytopenia: Likely related to recent chemotherapy-platelet count low but still more than 50,000.  Continue with Lovenox.  Recheck CBC tomorrow.  Hypokalemia: Replete and recheck  UTI: Cultures positive for E. coli 3 days of Rocephin.  Right breast cancer: Just completed chemo on 6/12-follows with Dr. [name].  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    DVT Prophylaxis: Full dose anticoagulation with Lovenox  Code Status: Full code   Family Communication: Spouse bedside  Disposition Plan: Remain inpatient-home most likely tomorrow-if CBC continues to be stable.  Antimicrobial agents: Anti-infectives (From admission, onward)   Start     Dose/Rate Route Frequency Ordered Stop   05/16/18 0800  cefTRIAXone (ROCEPHIN) 1 g in sodium chloride 0.9 % 100 mL IVPB     1 g 200 mL/hr over 30 Minutes Intravenous Every 24 hours 05/16/18 0742 05/19/18 0759   05/14/18 1200  cefTRIAXone (ROCEPHIN) 1 g in sodium chloride 0.9 % 100 mL IVPB  Status:  Discontinued     1 g 200 mL/hr over 30 Minutes Intravenous Every 24 hours 05/14/18 1149 05/15/18 1126      Procedures:  CTA - The study is positive for pulmonary embolus ( R.main). There is no evidence for RIGHT heart strain. Bilateral airspace filling and septal thickening consistent with mild edema  TTE - - Left ventricle: The cavity size was normal. Systolic function was normal. The estimated ejection fraction was in the range of 55%   to 60%.  Pericardium, extracardiac: A trivial pericardial effusion was   identified.  Bilateral lower extremity venous duplex completed.  Preliminary report:  There is age indeterminate DVT noted in the right common femoral, popliteal, posterior tibial, and peroneal veins, and in the left popliteal vein.     CONSULTS:  None  Time spent: 25- minutes-Greater than 50% of this time was spent in counseling, explanation of diagnosis, planning of further management, and coordination of care.  MEDICATIONS: Scheduled Meds: . sodium  chloride   Intravenous Once  . enoxaparin (LOVENOX) injection  90 mg Subcutaneous Q12H  . feeding supplement (ENSURE ENLIVE)  237 mL Oral TID BM  . sodium chloride flush  3 mL Intravenous Q12H   Continuous Infusions: . cefTRIAXone (ROCEPHIN)  IV 1 g (05/16/18 0922)   PRN Meds:.acetaminophen **OR** acetaminophen,  LORazepam   PHYSICAL EXAM: Vital signs: Vitals:   05/15/18 0848 05/15/18 2217 05/15/18 2329 05/16/18 0817  BP: 121/79 121/81 125/89 111/80  Pulse: 99  (!) 107 (!) 104  Resp: 15 16 18 16   Temp: 98.2 F (36.8 C) 98.3 F (36.8 C) 98.4 F (36.9 C) 98.5 F (36.9 C)  TempSrc: Oral Oral Oral Oral  SpO2: 100% 96% 99% 98%  Weight:      Height:       Filed Weights   05/14/18 1222  Weight: 88.2 kg (194 lb 7.1 oz)   Body mass index is 37.98 kg/m.   Exam  Awake Alert, Oriented X 3, No new F.N deficits, Normal affect [place].AT,PERRAL Supple Neck,No JVD, No cervical lymphadenopathy appriciated.  Symmetrical Chest wall movement, Good air movement bilaterally, CTAB RRR,No Gallops, Rubs or new Murmurs, No Parasternal Heave +ve B.Sounds, Abd Soft, No tenderness, No organomegaly appriciated, No rebound - guarding or rigidity. No Cyanosis, Clubbing or edema, No new Rash or bruise   I have personally reviewed following labs and imaging studies  LABORATORY DATA: CBC: Recent Labs  Lab 05/14/18 0925 05/14/18 0941 05/15/18 0605 05/16/18 0454  WBC 16.4*  --   --  10.8*  HGB 6.0* 6.8* 9.4* 9.7*  HCT 19.3* 20.0* 28.5* 29.6*  MCV 110.9*  --   --  101.4*  PLT 75*  --   --  51*    Basic Metabolic Panel: Recent Labs  Lab 05/14/18 0925 05/14/18 0941 05/15/18 0605 05/16/18 0454  NA 141 138 141 140  K 3.4* 3.3* 3.3* 3.5  CL 108 104 108 108  CO2 22  --  24 24  GLUCOSE 107* 102* 93 104*  BUN 7 6 6 11   CREATININE 1.43* 1.30* 1.17* 1.15*  CALCIUM 8.5*  --  8.1* 8.5*    GFR: Estimated Creatinine Clearance: 45.6 mL/min (A) (by C-G formula based on SCr of 1.15 mg/dL (H)).  Liver Function Tests: Recent Labs  Lab 05/14/18 0925  AST 15  ALT 14  ALKPHOS 61  BILITOT 0.8  PROT 5.4*  ALBUMIN 3.4*   No results for input(s): LIPASE, AMYLASE in the last 168 hours. No results for input(s): AMMONIA in the last 168 hours.  Coagulation Profile: No results for input(s): INR, PROTIME in  the last 168 hours.  Cardiac Enzymes: No results for input(s): CKTOTAL, CKMB, CKMBINDEX, TROPONINI in the last 168 hours.  BNP (last 3 results) No results for input(s): PROBNP in the last 8760 hours.  HbA1C: No results for input(s): HGBA1C in the last 72 hours.  CBG: No results for input(s): GLUCAP in the last 168 hours.  Lipid Profile: No results for input(s): CHOL, HDL, LDLCALC, TRIG, CHOLHDL, LDLDIRECT in the last 72 hours.  Thyroid Function Tests: Recent Labs    05/14/18 1127  TSH 1.619    Anemia Panel: No results for input(s): VITAMINB12, FOLATE, FERRITIN, TIBC, IRON, RETICCTPCT in the last 72 hours.  Urine analysis:    Component Value Date/Time   COLORURINE YELLOW 05/14/2018 [place] 05/14/2018 1042   LABSPEC 1.009 05/14/2018 1042   LABSPEC 1.010 05/02/2009 1206   PHURINE 5.0 05/14/2018 1042  GLUCOSEU NEGATIVE 05/14/2018 1042   HGBUR SMALL (A) 05/14/2018 1042   BILIRUBINUR NEGATIVE 05/14/2018 1042   KETONESUR NEGATIVE 05/14/2018 1042   PROTEINUR NEGATIVE 05/14/2018 1042   UROBILINOGEN 0.2 03/24/2009 0039   NITRITE POSITIVE (A) 05/14/2018 1042   LEUKOCYTESUR TRACE (A) 05/14/2018 1042    Sepsis Labs: Lactic Acid, Venous No results found for: LATICACIDVEN  MICROBIOLOGY: Recent Results (from the past 240 hour(s))  Urine culture     Status: Abnormal   Collection Time: 05/14/18 10:46 AM  Result Value Ref Range Status   Specimen Description URINE, RANDOM  Final   Special Requests   Final    NONE Performed at [facility], [place] [address], [place], [place] [address]    Culture >=100,000 COLONIES/mL ESCHERICHIA COLI (A)  Final   Report Status 05/16/2018 FINAL  Final   Organism ID, Bacteria ESCHERICHIA COLI (A)  Final      Susceptibility   Escherichia coli - MIC*    AMPICILLIN >=32 RESISTANT Resistant     CEFAZOLIN >=64 RESISTANT Resistant     CEFTRIAXONE <=1 SENSITIVE Sensitive     CIPROFLOXACIN >=4 RESISTANT Resistant      GENTAMICIN <=1 SENSITIVE Sensitive     IMIPENEM <=0.25 SENSITIVE Sensitive     NITROFURANTOIN <=16 SENSITIVE Sensitive     TRIMETH/SULFA >=320 RESISTANT Resistant     AMPICILLIN/SULBACTAM >=32 RESISTANT Resistant     PIP/TAZO >=128 RESISTANT Resistant     Extended ESBL NEGATIVE Sensitive     * >=100,000 COLONIES/mL ESCHERICHIA COLI    RADIOLOGY STUDIES/RESULTS: Dg Chest 2 View  Result Date: 05/14/2018 CLINICAL DATA:  Cardiac palpitations.  History of breast carcinoma EXAM: CHEST - 2 VIEW COMPARISON:  February 12, 2018 FINDINGS: Port-A-Cath tip is in the superior vena cava. No pneumothorax. There is no edema or consolidation. The heart size and pulmonary vascularity are normal. No adenopathy. Bones appear osteoporotic. No blastic or lytic bone lesions. IMPRESSION: Port-A-Cath tip in superior vena cava. No pneumothorax. No edema or consolidation. No evident adenopathy. Electronically Signed   By: Lowella Grip III M.D.   On: 05/14/2018 07:51   Ct Angio Chest Pe W And/or Wo Contrast  Result Date: 05/14/2018 CLINICAL DATA:  c/o palpitations starting yesterday, worsening this morning. Patient reports recent stroke in which she received TPA on Monday. She states she was told her blood count was low and she may just need a transfusion. EXAM: CT ANGIOGRAPHY CHEST WITH CONTRAST TECHNIQUE: Multidetector CT imaging of the chest was performed using the standard protocol during bolus administration of intravenous contrast. Multiplanar CT image reconstructions and MIPs were obtained to evaluate the vascular anatomy. CONTRAST:  64mL ISOVUE-370 IOPAMIDOL (ISOVUE-370) INJECTION 76% COMPARISON:  Chest x-ray 05/14/2018 FINDINGS: Cardiovascular: Heart size is normal. The RV: LV ratio is normal (0.89). There is no pericardial effusion. There is minimal atherosclerotic calcification of the coronary arteries. Thoracic aorta is normal in appearance. The pulmonary arteries are well opacified by contrast bolus. There are  pulmonary emboli within the RIGHT main pulmonary artery extending into the RIGHT LOWER lobe and RIGHT middle lobe branches. LEFT pulmonary arteries are normally opacified. LEFT-sided Port-A-Cath tip to the LOWER superior vena cava. Mediastinum/Nodes: The visualized portion of the thyroid gland has a normal appearance. The esophagus is normal in appearance. No mediastinal, hilar, or axillary adenopathy. Lungs/Pleura: There is bibasilar atelectasis. Smooth septal wall thickening and diffuse ground-glass opacities are consistent with mild pulmonary edema. Upper Abdomen: No acute abnormality. Musculoskeletal: No chest wall abnormality. No acute  or significant osseous findings. Review of the MIP images confirms the above findings. IMPRESSION: The study is positive for pulmonary embolus. There is no evidence for RIGHT heart strain. Bilateral airspace filling and septal thickening consistent with mild edema. Critical Value/emergent results were called by telephone at the time of interpretation on 05/14/2018 at 10:38 am to [facility] , who verbally acknowledged these results. Electronically Signed   By: Nolon Nations M.D.   On: 05/14/2018 10:41     LOS: 0 days   Signature  Lala Lund M.D on 05/16/2018 at 10:56 AM  Between 7am to 7pm - Pager - [phone] ( page via [email or web address], text pages only, please mention full 10 digit call back number).  After 7pm go to [email or web address] - password [facility]

## 2018-05-16 NOTE — Plan of Care (Signed)
  Problem: Health Behavior/Discharge Planning: Goal: Ability to manage health-related needs will improve Outcome: Progressing  RN educated pt on going home on Lovenox subQ injections. Pt was unaware that she needed to be doing her own injections before she goes home. RN demonstrated to patient on cleaning the skin prior to administration and how to inject the medication into her abdomen. Pt given opportunity to ask any questions and all questions were answered. Pt displayed some teachback, but does need support and encouragement. Pt stated she is nervous about being able to go home tomorrow.

## 2018-05-17 ENCOUNTER — Inpatient Hospital Stay: Payer: 59 | Admitting: Family

## 2018-05-17 ENCOUNTER — Inpatient Hospital Stay: Payer: 59

## 2018-05-17 DIAGNOSIS — I639 Cerebral infarction, unspecified: Secondary | ICD-10-CM

## 2018-05-17 DIAGNOSIS — Z9221 Personal history of antineoplastic chemotherapy: Secondary | ICD-10-CM

## 2018-05-17 DIAGNOSIS — N930 Postcoital and contact bleeding: Secondary | ICD-10-CM

## 2018-05-17 DIAGNOSIS — B9620 Unspecified Escherichia coli [E. coli] as the cause of diseases classified elsewhere: Secondary | ICD-10-CM

## 2018-05-17 DIAGNOSIS — Z170 Estrogen receptor positive status [ER+]: Secondary | ICD-10-CM

## 2018-05-17 LAB — BASIC METABOLIC PANEL
Anion gap: 9 (ref 5–15)
BUN: 23 mg/dL — ABNORMAL HIGH (ref 6–20)
CHLORIDE: 106 mmol/L (ref 101–111)
CO2: 24 mmol/L (ref 22–32)
Calcium: 8.5 mg/dL — ABNORMAL LOW (ref 8.9–10.3)
Creatinine, Ser: 1.28 mg/dL — ABNORMAL HIGH (ref 0.44–1.00)
GFR calc non Af Amer: 42 mL/min — ABNORMAL LOW (ref >60)
GFR, EST AFRICAN AMERICAN: 48 mL/min — AB (ref >60)
Glucose, Bld: 101 mg/dL — ABNORMAL HIGH (ref 65–99)
Potassium: 4.1 mmol/L (ref 3.5–5.1)
SODIUM: 139 mmol/L (ref 135–145)

## 2018-05-17 LAB — CBC WITH DIFFERENTIAL/PLATELET
BASOS ABS: 0 10*3/uL (ref 0.0–0.1)
BASOS PCT: 0 %
EOS ABS: 0 10*3/uL (ref 0.0–0.7)
Eosinophils Relative: 0 %
HEMATOCRIT: 27.7 % — AB (ref 36.0–46.0)
HEMOGLOBIN: 8.7 g/dL — AB (ref 12.0–15.0)
Lymphocytes Relative: 30 %
Lymphs Abs: 4.1 10*3/uL — ABNORMAL HIGH (ref 0.7–4.0)
MCH: 32.6 pg (ref 26.0–34.0)
MCHC: 31.4 g/dL (ref 30.0–36.0)
MCV: 103.7 fL — ABNORMAL HIGH (ref 78.0–100.0)
MONOS PCT: 6 %
Monocytes Absolute: 0.8 10*3/uL (ref 0.1–1.0)
NEUTROS ABS: 8.7 10*3/uL — AB (ref 1.7–7.7)
NEUTROS PCT: 64 %
Platelets: 38 10*3/uL — ABNORMAL LOW (ref 150–400)
RBC: 2.67 MIL/uL — ABNORMAL LOW (ref 3.87–5.11)
RDW: 20.5 % — ABNORMAL HIGH (ref 11.5–15.5)
WBC: 13.6 10*3/uL — ABNORMAL HIGH (ref 4.0–10.5)

## 2018-05-17 LAB — IRON AND TIBC
IRON: 272 ug/dL — AB (ref 28–170)
Saturation Ratios: 92 % — ABNORMAL HIGH (ref 10.4–31.8)
TIBC: 295 ug/dL (ref 250–450)
UIBC: 23 ug/dL

## 2018-05-17 LAB — FERRITIN: FERRITIN: 820 ng/mL — AB (ref 11–307)

## 2018-05-17 MED ORDER — HEPARIN (PORCINE) IN NACL 100-0.45 UNIT/ML-% IJ SOLN
1050.0000 [IU]/h | INTRAMUSCULAR | Status: DC
  Administered 2018-05-17: 1050 [IU]/h via INTRAVENOUS
  Filled 2018-05-17: qty 250

## 2018-05-17 MED ORDER — SODIUM CHLORIDE 0.9 % IV BOLUS
500.0000 mL | Freq: Once | INTRAVENOUS | Status: AC
  Administered 2018-05-17: 500 mL via INTRAVENOUS

## 2018-05-17 NOTE — Progress Notes (Signed)
PROGRESS NOTE        PATIENT DETAILS Name: Marcia [name] Age: 67 y.o. Sex: female Date of Birth: [birth year] Admit Date: 05/14/2018 Admitting Physician Roney Jaffe, MD [id number], Carloyn Manner, MD  Brief Narrative: Patient is a 67 y.o. 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  She was subsequently admitted to the hospitalist service.  See below for further details.  Subjective:  Patient in bed, appears comfortable, denies any headache, no fever, no chest pain or pressure, mild exertional shortness of breath , no abdominal pain. No focal weakness.   Assessment/Plan:  Pulmonary embolism: In a patient with recent history of breast cancer undergoing chemotherapy, continue Lovenox.  Lower extremity ultrasound confirms right leg extensive DVT, echocardiogram stable no evidence of heart strain.  Due to ongoing thrombocytopenia closely monitor for another day or 2 until platelet counts stabilized.  No signs of active bleeding.  Anemia of chronic disease along with marrow suppression due to chemo: Suspect related to recent chemotherapy-transfused of PRBC last night-hemoglobin currently stable.  Stable H&H.  Thrombocytopenia: Likely related to recent chemotherapy-platelet count low but still more than 50,000.  Continue with Lovenox.  Recheck CBC tomorrow.  Case discussed with patient's oncologist Dr. [name] in detail on 05/17/2018 by me, for now continue anticoagulation and continue to monitor platelets.  He is on board and will be following the patient daily.  Hypokalemia: replaced.  UTI:  Cultures positive for E. coli 3 days of Rocephin.  Right breast cancer: Just completed chemo on  6/12-follows with Dr. [name].  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    DVT Prophylaxis: Full dose anticoagulation with Lovenox  Code Status: Full code   Family Communication: Spouse bedside  Disposition Plan: Remain inpatient-home most likely tomorrow-if CBC continues to be stable.  Antimicrobial agents: Anti-infectives (From admission, onward)   Start     Dose/Rate Route Frequency Ordered Stop   05/16/18 0800  cefTRIAXone (ROCEPHIN) 1 g in sodium chloride 0.9 % 100 mL IVPB     1 g 200 mL/hr over 30 Minutes Intravenous Every 24 hours 05/16/18 0742 05/19/18 0759   05/14/18 1200  cefTRIAXone (ROCEPHIN) 1 g in sodium chloride 0.9 % 100 mL IVPB  Status:  Discontinued     1 g 200 mL/hr over 30 Minutes Intravenous Every 24 hours 05/14/18 1149 05/15/18 1126      Procedures:  CTA - The study is positive for pulmonary embolus ( R.main). There is no evidence for RIGHT heart strain. Bilateral airspace filling and septal thickening consistent with mild edema  TTE - - Left ventricle: The cavity size was normal. Systolic function was normal. The estimated ejection fraction was in the range of 55%   to 60%.  Pericardium, extracardiac: A trivial pericardial effusion was   identified.  Bilateral lower extremity venous duplex completed.  Preliminary report:  There is age indeterminate DVT noted in the right common femoral, popliteal, posterior tibial, and peroneal veins, and in the left popliteal vein.     CONSULTS:  [place] -  Ennever  Time spent: 25- minutes-Greater than 50% of this time was spent in counseling, explanation of diagnosis, planning of further management, and coordination of care.  MEDICATIONS: Scheduled Meds: . sodium chloride   Intravenous Once  . enoxaparin (LOVENOX) injection  90 mg Subcutaneous Q12H  . feeding supplement (ENSURE ENLIVE)   237 mL Oral TID BM  . sodium chloride flush  3 mL Intravenous Q12H   Continuous Infusions: . cefTRIAXone (ROCEPHIN)  IV 1 g (05/16/18 0922)   PRN Meds:.acetaminophen **OR** acetaminophen, LORazepam   PHYSICAL EXAM: Vital signs: Vitals:   05/16/18 0817 05/16/18 1652 05/17/18 0029 05/17/18 0759  BP: 111/80 98/75 99/66  94/62  Pulse: (!) 104 (!) 105 (!) 115 67  Resp: 16 16  (!) 21  Temp: 98.5 F (36.9 C) 98.5 F (36.9 C) 98.2 F (36.8 C) 97.9 F (36.6 C)  TempSrc: Oral Oral Oral Oral  SpO2: 98% 99% 100% 97%  Weight:      Height:       Filed Weights   05/14/18 1222  Weight: 88.2 kg (194 lb 7.1 oz)   Body mass index is 37.98 kg/m.   Exam  Awake Alert, Oriented X 3, No new F.N deficits, Normal affect [place].AT,PERRAL Supple Neck,No JVD, No cervical lymphadenopathy appriciated.  Symmetrical Chest wall movement, Good air movement bilaterally, CTAB RRR,No Gallops, Rubs or new Murmurs, No Parasternal Heave +ve B.Sounds, Abd Soft, No tenderness, No organomegaly appriciated, No rebound - guarding or rigidity. No Cyanosis, Clubbing or edema, Large R arm hematoma    I have personally reviewed following labs and imaging studies  LABORATORY DATA: CBC: Recent Labs  Lab 05/14/18 0925 05/14/18 0941 05/15/18 0605 05/16/18 0454 05/17/18 0500  WBC 16.4*  --   --  10.8* 13.6*  NEUTROABS  --   --   --   --  8.7*  HGB 6.0* 6.8* 9.4* 9.7* 8.7*  HCT 19.3* 20.0* 28.5* 29.6* 27.7*  MCV 110.9*  --   --  101.4* 103.7*  PLT 75*  --   --  51* 38*    Basic Metabolic Panel: Recent Labs  Lab 05/14/18 0925 05/14/18 0941 05/15/18 0605 05/16/18 0454 05/17/18 0500  NA 141 138 141 140 139  K 3.4* 3.3* 3.3* 3.5 4.1  CL 108 104 108 108 106  CO2 22  --  24 24 24   GLUCOSE 107* 102* 93 104* 101*  BUN 7 6 6 11  23*  CREATININE 1.43* 1.30* 1.17* 1.15* 1.28*  CALCIUM 8.5*  --  8.1* 8.5* 8.5*    GFR: Estimated Creatinine Clearance: 41 mL/min (A) (by C-G formula based on SCr of 1.28 mg/dL  (H)).  Liver Function Tests: Recent Labs  Lab 05/14/18 0925  AST 15  ALT 14  ALKPHOS 61  BILITOT 0.8  PROT 5.4*  ALBUMIN 3.4*   No results for input(s): LIPASE, AMYLASE in the last 168 hours. No results for input(s): AMMONIA in the last 168 hours.  Coagulation Profile: No results for input(s): INR, PROTIME in the last 168 hours.  Cardiac Enzymes: No results for input(s): CKTOTAL, CKMB, CKMBINDEX, TROPONINI in the last 168 hours.  BNP (last 3 results) No results for input(s): PROBNP in the last 8760 hours.  HbA1C: No results for input(s): HGBA1C in the last 72 hours.  CBG: No results for input(s): GLUCAP in the last 168 hours.  Lipid Profile: No results for input(s): CHOL, HDL, LDLCALC, TRIG, CHOLHDL, LDLDIRECT in the last 72 hours.  Thyroid Function Tests: Recent Labs  05/14/18 1127  TSH 1.619    Anemia Panel: No results for input(s): VITAMINB12, FOLATE, FERRITIN, TIBC, IRON, RETICCTPCT in the last 72 hours.  Urine analysis:    Component Value Date/Time   COLORURINE YELLOW 05/14/2018 [place] 05/14/2018 1042   LABSPEC 1.009 05/14/2018 1042   LABSPEC 1.010 05/02/2009 1206   PHURINE 5.0 05/14/2018 1042   GLUCOSEU NEGATIVE 05/14/2018 1042   HGBUR SMALL (A) 05/14/2018 1042   BILIRUBINUR NEGATIVE 05/14/2018 1042   KETONESUR NEGATIVE 05/14/2018 1042   PROTEINUR NEGATIVE 05/14/2018 1042   UROBILINOGEN 0.2 03/24/2009 0039   NITRITE POSITIVE (A) 05/14/2018 1042   LEUKOCYTESUR TRACE (A) 05/14/2018 1042    Sepsis Labs: Lactic Acid, Venous No results found for: LATICACIDVEN  MICROBIOLOGY: Recent Results (from the past 240 hour(s))  Urine culture     Status: Abnormal   Collection Time: 05/14/18 10:46 AM  Result Value Ref Range Status   Specimen Description URINE, RANDOM  Final   Special Requests   Final    NONE Performed at [facility], [place] [address] [place], [place] [address]    Culture >=100,000 COLONIES/mL ESCHERICHIA COLI  (A)  Final   Report Status 05/16/2018 FINAL  Final   Organism ID, Bacteria ESCHERICHIA COLI (A)  Final      Susceptibility   Escherichia coli - MIC*    AMPICILLIN >=32 RESISTANT Resistant     CEFAZOLIN >=64 RESISTANT Resistant     CEFTRIAXONE <=1 SENSITIVE Sensitive     CIPROFLOXACIN >=4 RESISTANT Resistant     GENTAMICIN <=1 SENSITIVE Sensitive     IMIPENEM <=0.25 SENSITIVE Sensitive     NITROFURANTOIN <=16 SENSITIVE Sensitive     TRIMETH/SULFA >=320 RESISTANT Resistant     AMPICILLIN/SULBACTAM >=32 RESISTANT Resistant     PIP/TAZO >=128 RESISTANT Resistant     Extended ESBL NEGATIVE Sensitive     * >=100,000 COLONIES/mL ESCHERICHIA COLI    RADIOLOGY STUDIES/RESULTS: Dg Chest 2 View  Result Date: 05/14/2018 CLINICAL DATA:  Cardiac palpitations.  History of breast carcinoma EXAM: CHEST - 2 VIEW COMPARISON:  February 12, 2018 FINDINGS: Port-A-Cath tip is in the superior vena cava. No pneumothorax. There is no edema or consolidation. The heart size and pulmonary vascularity are normal. No adenopathy. Bones appear osteoporotic. No blastic or lytic bone lesions. IMPRESSION: Port-A-Cath tip in superior vena cava. No pneumothorax. No edema or consolidation. No evident adenopathy. Electronically Signed   By: Lowella Grip III M.D.   On: 05/14/2018 07:51   Ct Angio Chest Pe W And/or Wo Contrast  Result Date: 05/14/2018 CLINICAL DATA:  c/o palpitations starting yesterday, worsening this morning. Patient reports recent stroke in which she received TPA on Monday. She states she was told her blood count was low and she may just need a transfusion. EXAM: CT ANGIOGRAPHY CHEST WITH CONTRAST TECHNIQUE: Multidetector CT imaging of the chest was performed using the standard protocol during bolus administration of intravenous contrast. Multiplanar CT image reconstructions and MIPs were obtained to evaluate the vascular anatomy. CONTRAST:  77mL ISOVUE-370 IOPAMIDOL (ISOVUE-370) INJECTION 76% COMPARISON:   Chest x-ray 05/14/2018 FINDINGS: Cardiovascular: Heart size is normal. The RV: LV ratio is normal (0.89). There is no pericardial effusion. There is minimal atherosclerotic calcification of the coronary arteries. Thoracic aorta is normal in appearance. The pulmonary arteries are well opacified by contrast bolus. There are pulmonary emboli within the RIGHT main pulmonary artery extending into the RIGHT LOWER lobe and RIGHT middle lobe branches. LEFT pulmonary arteries are  normally opacified. LEFT-sided Port-A-Cath tip to the LOWER superior vena cava. Mediastinum/Nodes: The visualized portion of the thyroid gland has a normal appearance. The esophagus is normal in appearance. No mediastinal, hilar, or axillary adenopathy. Lungs/Pleura: There is bibasilar atelectasis. Smooth septal wall thickening and diffuse ground-glass opacities are consistent with mild pulmonary edema. Upper Abdomen: No acute abnormality. Musculoskeletal: No chest wall abnormality. No acute or significant osseous findings. Review of the MIP images confirms the above findings. IMPRESSION: The study is positive for pulmonary embolus. There is no evidence for RIGHT heart strain. Bilateral airspace filling and septal thickening consistent with mild edema. Critical Value/emergent results were called by telephone at the time of interpretation on 05/14/2018 at 10:38 am to [facility] , who verbally acknowledged these results. Electronically Signed   By: Nolon Nations M.D.   On: 05/14/2018 10:41     LOS: 1 day   Signature  Lala Lund M.D on 05/17/2018 at 9:28 AM  Between 7am to 7pm - Pager - [phone] ( page via [email or web address], text pages only, please mention full 10 digit call back number).  After 7pm go to [email or web address] - password [email or web address]

## 2018-05-17 NOTE — Consult Note (Signed)
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  Chief Complaint  Patient presents with  . Palpitations  : I have a blood clot now.  HPI: Marcia [name] is well-known to me.  She is a 67 year old postmenopausal white female.  I initially saw her back in 2011.  She had light chain myeloma.  She underwent a stem cell transplantation at [facility] in September 2011.  She has been in remission since.  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  ER negative and HER-2 positive.  She underwent neoadjuvant chemotherapy.  She completed 4 cycles and May 05, 2018.  Since then, she apparently had a CVA.  She was treated locally.  She was given TPA.  Apparently this was a week or so ago.  Then, on Friday, she began to have some shortness of breath.  She was taken to [facility].  She had a CT angiogram which showed bilateral pulmonary emboli.  She had Dopplers done which showed a thrombus in the common femoral vein down to the peroneal vein on the right side.  On the left side, there was a thrombus in the popliteal vein.  She is on Lovenox.  When she was admitted, she had a hemoglobin of 6.  She was transfused with 2 units of blood.  Her platelet count is trending downward.  I am sure this is from her chemotherapy.  Today, her white cell count was 13.6.  Hemoglobin 8.7.  Platelet count 38,000.  Her electrolytes show a creatinine of 1.28.  She, as always, wants to go home.  She is currently on Lovenox.  She is on twice daily Lovenox.  She also is on Rocephin.There is E. coli in her urine.  She has had no fever.  There is no nausea or vomiting.  She has had no obvious bleeding.  There is some diarrhea.  She has had no rashes.  She denies any leg swelling.  Overall, her performance status is ECOG 1.   Past Medical History:  Diagnosis Date  .  Anemia   . Cancer [facility])    Multiple Myeloma  . Counseling regarding goals of care 02/02/2018  . Hypertension   . 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 Primary cancer of right breast without evidence of regional lymph node metastasis (N0) ([place]) 02/01/2018  :  Past Surgical History:  Procedure Laterality Date  . ABDOMINAL HYSTERECTOMY     partial  . APPENDECTOMY    . BREAST SURGERY     biopsy  . PORTACATH PLACEMENT Left 02/12/2018   Procedure: INSERTION PORT-A-CATH;  Surgeon: Fanny Skates, MD;  Location: [place];  Service: General;  Laterality: Left;  . TUBAL LIGATION    :   Current Facility-Administered Medications:  .  0.9 %  sodium chloride infusion (Manually program via Guardrails IV Fluids), , Intravenous, Once, Samella Parr, NP .  acetaminophen (TYLENOL) tablet 650 mg, 650 mg, Oral, Q6H PRN **OR** acetaminophen (TYLENOL) suppository 650 mg, 650 mg, Rectal, Q6H PRN, Samella Parr, NP .  cefTRIAXone (ROCEPHIN) 1 g in sodium chloride 0.9 % 100 mL IVPB, 1 g, Intravenous, Q24H, Thurnell Lose, MD, Last Rate: 200 mL/hr at 05/16/18 0922, 1 g at 05/16/18 0922 .  enoxaparin (LOVENOX) injection 90 mg, 90 mg, Subcutaneous, Q12H, Samella Parr, NP, 90 mg at 05/16/18 2242 .  feeding supplement (ENSURE ENLIVE) (ENSURE ENLIVE) liquid 237 mL, 237 mL, Oral, TID BM, Roney Jaffe, MD, 237 mL at 05/16/18 1431 .  LORazepam (ATIVAN) tablet 0.5 mg, 0.5 mg, Oral, Q6H PRN, Erin Hearing L, NP, 0.5 mg at 05/16/18 1431 .  sodium chloride flush (NS) 0.9 % injection 3 mL, 3 mL, Intravenous, Q12H, Erin Hearing L, NP, 3 mL at 05/16/18 2249:  . sodium chloride   Intravenous Once  . enoxaparin (LOVENOX) injection  90 mg Subcutaneous Q12H  . feeding supplement (ENSURE ENLIVE)  237 mL Oral TID BM  . sodium chloride flush  3 mL Intravenous Q12H  :  Allergies  Allergen Reactions  . Bactrim [Sulfamethoxazole-Trimethoprim] Other (See Comments)    ? Renal failure.  .  Zofran [Ondansetron] Shortness Of Breath    "felt like throat was closing"  . Clarithromycin Diarrhea and Nausea And Vomiting  . Macrodantin [Nitrofurantoin] Rash  :  Family History  Problem Relation Age of Onset  . COPD Mother   . COPD Father   :  Social History   Socioeconomic History  . Marital status: Married    Spouse name: Not on file  . Number of children: Not on file  . Years of education: Not on file  . Highest education level: Not on file  Occupational History  . Not on file  Social Needs  . Financial resource strain: Not on file  . Food insecurity:    Worry: Not on file    Inability: Not on file  . Transportation needs:    Medical: Not on file    Non-medical: Not on file  Tobacco Use  . Smoking status: Never Smoker  . Smokeless tobacco: Never Used  . Tobacco comment: never used product  Substance and Sexual Activity  . Alcohol use: Never    Alcohol/week: 0.0 oz    Frequency: Never  . Drug use: Never  . Sexual activity: Not on file  Lifestyle  . Physical activity:    Days per week: Not on file    Minutes per session: Not on file  . Stress: Not on file  Relationships  . Social connections:    Talks on phone: Not on file    Gets together: Not on file    Attends religious service: Not on file    Active member of club or organization: Not on file    Attends meetings of clubs or organizations: Not on file    Relationship status: Not on file  . Intimate partner violence:    Fear of current or ex partner: Not on file    Emotionally abused: Not on file    Physically abused: Not on file    Forced sexual activity: Not on file  Other Topics Concern  . Not on file  Social History Narrative  . Not on file  :  Pertinent items are noted in HPI.  Exam: Patient Vitals for the past 24 hrs:  BP Temp Temp src Pulse Resp SpO2  05/17/18 0029 99/66 98.2 F (36.8 C) Oral (!) 115 - 100 %  05/16/18 1652 98/75 98.5 F (36.9 C) Oral (!) 105 16 99 %  05/16/18  0817 111/80 98.5 F (36.9 C) Oral (!) 104 16 98 %     Recent Labs    05/16/18 0454 05/17/18 0500  WBC 10.8* 13.6*  HGB 9.7* 8.7*  HCT 29.6* 27.7*  PLT 51* 38*   Recent Labs    05/16/18 0454 05/17/18 0500  NA 140 139  K 3.5 4.1  CL 108 106  CO2 24 24  GLUCOSE 104* 101*  BUN 11 23*  CREATININE 1.15* 1.28*  CALCIUM 8.5* 8.5*    Blood smear review: None  Pathology: None    Assessment and Plan: Marcia [name] is a 67 year old white female with a 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  She completed neoadjuvant chemotherapy along with Herceptin/Perjeta.  Of note, she had a echocardiogram done on June 22.  She had an ejection fraction of 55-60%.  He currently is on Lovenox.  I would agree with the Lovenox.  Her hemoglobin is on the low side.  I had believe this is from her chemotherapy.  Her platelet count is dropping.  I had believe this also is from her chemotherapy.  I do not see any problems with her on the Lovenox right now with her platelet count being low.  Again, these are to totally different mechanisms for anticoagulation.  I think the real issue is whether or not she we will be able to have her breast surgery.  I am sure that surgery and anesthesiology may have some reservations about operating on her with this recent CVA and now with this pulmonary embolism/DVT.  She really wants to go home.  This is not unusual for her.  She thinks she can do everything at home as she can do in the hospital.  I would definitely keep her in the hospital.  She has a E. coli UTI.  I think this is a problem.  I would definitely try to treat her with IV antibiotics.  I appreciate everybody's help.  Everybody has done a fantastic job with her.  [name], MD  1 Timothy 4:10

## 2018-05-17 NOTE — Progress Notes (Signed)
ANTICOAGULATION CONSULT NOTE - Initial Consult  Pharmacy Consult for heparin Indication: pulmonary embolus and DVT  Allergies  Allergen Reactions  . Bactrim [Sulfamethoxazole-Trimethoprim] Other (See Comments)    ? Renal failure.  . Zofran [Ondansetron] Shortness Of Breath    "felt like throat was closing"  . Clarithromycin Diarrhea and Nausea And Vomiting  . Macrodantin [Nitrofurantoin] Rash    Patient Measurements: Height: 5' (152.4 cm) Weight: 194 lb 7.1 oz (88.2 kg) IBW/kg (Calculated) : 45.5 Heparin Dosing Weight: 66 kg  Vital Signs: Temp: 97.9 F (36.6 C) (06/24 0759) Temp Source: Oral (06/24 0759) BP: 94/62 (06/24 0759) Pulse Rate: 67 (06/24 0759)  Labs: Recent Labs    05/15/18 0605 05/16/18 0454 05/17/18 0500  HGB 9.4* 9.7* 8.7*  HCT 28.5* 29.6* 27.7*  PLT  --  51* 38*  CREATININE 1.17* 1.15* 1.28*    Estimated Creatinine Clearance: 41 mL/min (A) (by C-G formula based on SCr of 1.28 mg/dL (H)).  Assessment: CC/HPI: 67 yo f presenting with acute pe  PMH: cva 1 week ago with tpa, breast ca pending surgery  Anticoag: acute bilateral PE - no R heart strain LE dopplers positive for multiple DVTs (prelim report 6/22).  Enox (last dose ~ 1200 6/24)>> hep gtt (pt has had tPa 6/16 for acute cva)  Neuro: s/p tPA for acute CVA 6/16 in [place]  Renal: SCr 1.28   Heme/Onc: active breast cancer -  s/p 4th cycle of chemo H&H 8.7/27.7, plt 38 (carboplatin/taxotere/herceptin)- last chemo given 6/12 (also received pegfilgrastim)  Hgb and pltc low - s/p 3 units PRBC on 6/21  Goal of Therapy:  Heparin level 0.3-0.7 units/ml Monitor platelets by anticoagulation protocol: Yes   Plan:  Dc enoxaparin Heparin at 2330 1050 units/hr Initial lvl 0800 Daily hep lvl, cbc Watch plt, change back to enoxaparin Watch for bleeding - pt has arm hematoma  Levester Fresh, PharmD, BCPS, BCCCP Clinical Pharmacist Clinical phone for 05/17/2018 from 1430 - 2300: [phone] If  after 2300, please call main pharmacy at: [phone] 05/17/2018 3:05 PM

## 2018-05-18 ENCOUNTER — Inpatient Hospital Stay: Payer: 59

## 2018-05-18 LAB — PREPARE RBC (CROSSMATCH)

## 2018-05-18 LAB — CBC WITH DIFFERENTIAL/PLATELET
Basophils Absolute: 0 10*3/uL (ref 0.0–0.1)
Basophils Relative: 0 %
EOS PCT: 0 %
Eosinophils Absolute: 0 10*3/uL (ref 0.0–0.7)
HEMATOCRIT: 20.6 % — AB (ref 36.0–46.0)
Hemoglobin: 6.6 g/dL — CL (ref 12.0–15.0)
LYMPHS PCT: 21 %
Lymphs Abs: 3.5 10*3/uL (ref 0.7–4.0)
MCH: 33.5 pg (ref 26.0–34.0)
MCHC: 32 g/dL (ref 30.0–36.0)
MCV: 104.6 fL — AB (ref 78.0–100.0)
MONO ABS: 0.8 10*3/uL (ref 0.1–1.0)
Monocytes Relative: 5 %
NEUTROS PCT: 74 %
Neutro Abs: 12.4 10*3/uL — ABNORMAL HIGH (ref 1.7–7.7)
PLATELETS: 32 10*3/uL — AB (ref 150–400)
RBC: 1.97 MIL/uL — AB (ref 3.87–5.11)
RDW: 20.3 % — ABNORMAL HIGH (ref 11.5–15.5)
WBC: 16.7 10*3/uL — AB (ref 4.0–10.5)

## 2018-05-18 LAB — COMPREHENSIVE METABOLIC PANEL
ALBUMIN: 2.9 g/dL — AB (ref 3.5–5.0)
ALT: 14 U/L (ref 0–44)
AST: 16 U/L (ref 15–41)
Alkaline Phosphatase: 53 U/L (ref 38–126)
Anion gap: 11 (ref 5–15)
BUN: 34 mg/dL — AB (ref 8–23)
CHLORIDE: 104 mmol/L (ref 98–111)
CO2: 22 mmol/L (ref 22–32)
CREATININE: 1.32 mg/dL — AB (ref 0.44–1.00)
Calcium: 8.2 mg/dL — ABNORMAL LOW (ref 8.9–10.3)
GFR calc Af Amer: 47 mL/min — ABNORMAL LOW (ref >60)
GFR, EST NON AFRICAN AMERICAN: 40 mL/min — AB (ref >60)
GLUCOSE: 110 mg/dL — AB (ref 70–99)
POTASSIUM: 3.6 mmol/L (ref 3.5–5.1)
Sodium: 137 mmol/L (ref 135–145)
Total Bilirubin: 0.6 mg/dL (ref 0.3–1.2)
Total Protein: 4.9 g/dL — ABNORMAL LOW (ref 6.5–8.1)

## 2018-05-18 LAB — HEPARIN LEVEL (UNFRACTIONATED): HEPARIN UNFRACTIONATED: 1.46 [IU]/mL — AB (ref 0.30–0.70)

## 2018-05-18 LAB — CBC
HCT: 28.9 % — ABNORMAL LOW (ref 36.0–46.0)
Hemoglobin: 9.8 g/dL — ABNORMAL LOW (ref 12.0–15.0)
MCH: 31 pg (ref 26.0–34.0)
MCHC: 33.9 g/dL (ref 30.0–36.0)
MCV: 91.5 fL (ref 78.0–100.0)
PLATELETS: 25 10*3/uL — AB (ref 150–400)
RBC: 3.16 MIL/uL — AB (ref 3.87–5.11)
RDW: 19 % — ABNORMAL HIGH (ref 11.5–15.5)
WBC: 19.6 10*3/uL — AB (ref 4.0–10.5)

## 2018-05-18 MED ORDER — SODIUM CHLORIDE 0.9% IV SOLUTION
Freq: Once | INTRAVENOUS | Status: AC
  Administered 2018-05-18: 08:00:00 via INTRAVENOUS

## 2018-05-18 MED ORDER — HEPARIN (PORCINE) IN NACL 100-0.45 UNIT/ML-% IJ SOLN
1050.0000 [IU]/h | INTRAMUSCULAR | Status: DC
  Administered 2018-05-18: 1050 [IU]/h via INTRAVENOUS

## 2018-05-18 MED ORDER — FOLIC ACID 1 MG PO TABS
2.0000 mg | ORAL_TABLET | Freq: Every day | ORAL | Status: DC
  Administered 2018-05-18 – 2018-05-22 (×5): 2 mg via ORAL
  Filled 2018-05-18 (×5): qty 2

## 2018-05-18 MED ORDER — PROCHLORPERAZINE EDISYLATE 10 MG/2ML IJ SOLN
10.0000 mg | Freq: Four times a day (QID) | INTRAMUSCULAR | Status: DC | PRN
Start: 2018-05-18 — End: 2018-05-22
  Administered 2018-05-18: 10 mg via INTRAVENOUS
  Filled 2018-05-18 (×2): qty 2

## 2018-05-18 MED ORDER — SODIUM CHLORIDE 0.9% IV SOLUTION
Freq: Once | INTRAVENOUS | Status: AC
  Administered 2018-05-18: 11:00:00 via INTRAVENOUS

## 2018-05-18 MED ORDER — FUROSEMIDE 10 MG/ML IJ SOLN
20.0000 mg | Freq: Once | INTRAMUSCULAR | Status: AC
  Administered 2018-05-18: 20 mg via INTRAVENOUS
  Filled 2018-05-18: qty 2

## 2018-05-18 NOTE — Progress Notes (Signed)
PROGRESS NOTE        PATIENT DETAILS Name: Marcia [name] Age: 67 y.o. Sex: female Date of Birth: 11-Dec-1950 Admit Date: 05/14/2018 Admitting Physician Roney Jaffe, MD [id number], Carloyn Manner, MD  Brief Narrative: Patient is a 67 y.o. female with history of CVA requiring TPA 6/16 ([facility] in [place]) with no current residual deficits, history of breast cancer last chemo on 6/12, prior history of light chain myeloma s/p bone marrow transplantation 2011-presented with shortness of breath and palpitations, CT angiogram of the chest showed a pulmonary embolism.  She was subsequently admitted to the hospitalist service.  Her current issues are thrombocytopenia with right arm hematoma and anemia in the setting of acute PE and anticoagulation.  Oncology on board.   Subjective:  Patient in bed, appears comfortable, denies any headache, no fever, no chest pain or pressure, no shortness of breath at rest, no abdominal pain. No focal weakness.  Assessment/Plan:  Pulmonary embolism: In a patient with recent history of breast cancer undergoing chemotherapy, continue Lovenox.  Lower extremity ultrasound confirms right leg extensive DVT, echocardiogram stable no evidence of heart strain.  Due to ongoing thrombocytopenia, left arm hematoma and drop in H&H she has been transitioned from Lovenox to heparin after detailed discussion with hematologist oncologist Dr. [name], plan is to continue cautious anticoagulation and monitor.  If anticoagulation becomes too risky temporary IVC filter may be needed.  For now continue heparin and monitor CBC daily.  Dr. [name] will update plan daily.   Anemia of chronic disease along with marrow suppression due to chemo: Suspect related to recent chemotherapy-transfused of PRBC on the day of admission subsequently H&H again fell on 05/18/2018 due to blood loss in the right arm hematoma she will get 2 more units of packed RBCs on 05/18/2018.   Discussed with Dr. [name] continue anticoagulation for now.  Thrombocytopenia: Likely related to recent chemotherapy-platelet count low but still more than 50,000.  Continue with Lovenox.  Recheck CBC tomorrow.  Case discussed with patient's oncologist Dr. [name] in detail on 05/17/2018 by me, for now continue anticoagulation and continue to monitor platelets.  He is on board and will be following the patient daily.  Large right arm hematoma.  At the site of previous IV in the setting of thrombocytopenia and ongoing anticoagulation for large PE and DVT.  Monitor.  Seems to be stabilizing now.  Hypokalemia: replaced and stable.  UTI:  Cultures positive for E. coli 3 days of Rocephin last day of antibiotic today.  Right breast cancer: Just completed chemo on 6/12-follows with Dr. [name].  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      DVT Prophylaxis: Full dose anticoagulation with Lovenox  Code Status: Full code   Family Communication: Spouse bedside  Disposition Plan: Remain inpatient-home most likely tomorrow-if CBC continues to be stable.  Antimicrobial agents: Anti-infectives (From admission, onward)   Start     Dose/Rate Route Frequency Ordered Stop   05/16/18 0800  cefTRIAXone (ROCEPHIN) 1 g in sodium chloride 0.9 % 100 mL IVPB     1 g 200 mL/hr over 30 Minutes Intravenous Every 24 hours 05/16/18 0742 05/18/18 0856   05/14/18 1200  cefTRIAXone (ROCEPHIN) 1 g in sodium chloride 0.9 % 100 mL IVPB  Status:  Discontinued  1 g 200 mL/hr over 30 Minutes Intravenous Every 24 hours 05/14/18 1149 05/15/18 1126      Procedures:  CTA - The study is positive for pulmonary embolus ( R.main). There is no evidence for RIGHT heart strain. Bilateral airspace filling and septal thickening consistent with mild edema  TTE - - Left ventricle: The cavity size  was normal. Systolic function was normal. The estimated ejection fraction was in the range of 55%   to 60%.  Pericardium, extracardiac: A trivial pericardial effusion was   identified.  Bilateral lower extremity venous duplex completed.  Preliminary report:  There is age indeterminate DVT noted in the right common femoral, popliteal, posterior tibial, and peroneal veins, and in the left popliteal vein.     CONSULTS:  [place] - Ennever  Time spent: 25- minutes-Greater than 50% of this time was spent in counseling, explanation of diagnosis, planning of further management, and coordination of care.  MEDICATIONS: Scheduled Meds: . sodium chloride   Intravenous Once  . sodium chloride   Intravenous Once  . feeding supplement (ENSURE ENLIVE)  237 mL Oral TID BM  . folic acid  2 mg Oral Daily  . furosemide  20 mg Intravenous Once  . sodium chloride flush  3 mL Intravenous Q12H   Continuous Infusions: . heparin 1,050 Units/hr (05/18/18 0933)   PRN Meds:.acetaminophen **OR** acetaminophen, LORazepam   PHYSICAL EXAM: Vital signs: Vitals:   05/17/18 0759 05/17/18 1559 05/18/18 0029 05/18/18 0811  BP: 94/62 108/63 106/79 102/65  Pulse: 67 (!) 118 (!) 125 (!) 114  Resp: (!) 21 16  18   Temp: 97.9 F (36.6 C) 98.1 F (36.7 C) 98.4 F (36.9 C) 98.1 F (36.7 C)  TempSrc: Oral Oral Oral Oral  SpO2: 97% 99% 100% 100%  Weight:      Height:       Filed Weights   05/14/18 1222  Weight: 88.2 kg (194 lb 7.1 oz)   Body mass index is 37.98 kg/m.   Exam  Awake Alert, Oriented X 3, No new F.N deficits, Normal affect [place].AT,PERRAL Supple Neck,No JVD, No cervical lymphadenopathy appriciated.  Symmetrical Chest wall movement, Good air movement bilaterally, CTAB RRR,No Gallops, Rubs or new Murmurs, No Parasternal Heave +ve B.Sounds, Abd Soft, No tenderness, No organomegaly appriciated, No rebound - guarding or rigidity. No Cyanosis, Clubbing or edema, R. arm ecchymosis/hematoma quite large  but now seems to be stabilizing not much different than yesterday  I have personally reviewed following labs and imaging studies  LABORATORY DATA: CBC: Recent Labs  Lab 05/14/18 0925 05/14/18 0941 05/15/18 0605 05/16/18 0454 05/17/18 0500 05/18/18 0439  WBC 16.4*  --   --  10.8* 13.6* 16.7*  NEUTROABS  --   --   --   --  8.7* 12.4*  HGB 6.0* 6.8* 9.4* 9.7* 8.7* 6.6*  HCT 19.3* 20.0* 28.5* 29.6* 27.7* 20.6*  MCV 110.9*  --   --  101.4* 103.7* 104.6*  PLT 75*  --   --  51* 38* 32*    Basic Metabolic Panel: Recent Labs  Lab 05/14/18 0925 05/14/18 0941 05/15/18 0605 05/16/18 0454 05/17/18 0500 05/18/18 0439  NA 141 138 141 140 139 137  K 3.4* 3.3* 3.3* 3.5 4.1 3.6  CL 108 104 108 108 106 104  CO2 22  --  24 24 24 22   GLUCOSE 107* 102* 93 104* 101* 110*  BUN 7 6 6 11  23* 34*  CREATININE 1.43* 1.30* 1.17* 1.15* 1.28* 1.32*  CALCIUM 8.5*  --  8.1* 8.5* 8.5* 8.2*    GFR: Estimated Creatinine Clearance: 39.8 mL/min (A) (by C-G formula based on SCr of 1.32 mg/dL (H)).  Liver Function Tests: Recent Labs  Lab 05/14/18 0925 05/18/18 0439  AST 15 16  ALT 14 14  ALKPHOS 61 53  BILITOT 0.8 0.6  PROT 5.4* 4.9*  ALBUMIN 3.4* 2.9*   No results for input(s): LIPASE, AMYLASE in the last 168 hours. No results for input(s): AMMONIA in the last 168 hours.  Coagulation Profile: No results for input(s): INR, PROTIME in the last 168 hours.  Cardiac Enzymes: No results for input(s): CKTOTAL, CKMB, CKMBINDEX, TROPONINI in the last 168 hours.  BNP (last 3 results) No results for input(s): PROBNP in the last 8760 hours.  HbA1C: No results for input(s): HGBA1C in the last 72 hours.  CBG: No results for input(s): GLUCAP in the last 168 hours.  Lipid Profile: No results for input(s): CHOL, HDL, LDLCALC, TRIG, CHOLHDL, LDLDIRECT in the last 72 hours.  Thyroid Function Tests: No results for input(s): TSH, T4TOTAL, FREET4, T3FREE, THYROIDAB in the last 72 hours.  Anemia  Panel: Recent Labs    05/17/18 0712  FERRITIN 820*  TIBC 295  IRON 272*    Urine analysis:    Component Value Date/Time   COLORURINE YELLOW 05/14/2018 [place] 05/14/2018 1042   LABSPEC 1.009 05/14/2018 1042   LABSPEC 1.010 05/02/2009 1206   PHURINE 5.0 05/14/2018 1042   GLUCOSEU NEGATIVE 05/14/2018 1042   HGBUR SMALL (A) 05/14/2018 1042   BILIRUBINUR NEGATIVE 05/14/2018 1042   KETONESUR NEGATIVE 05/14/2018 1042   PROTEINUR NEGATIVE 05/14/2018 1042   UROBILINOGEN 0.2 03/24/2009 0039   NITRITE POSITIVE (A) 05/14/2018 1042   LEUKOCYTESUR TRACE (A) 05/14/2018 1042    Sepsis Labs: Lactic Acid, Venous No results found for: LATICACIDVEN  MICROBIOLOGY: Recent Results (from the past 240 hour(s))  Urine culture     Status: Abnormal   Collection Time: 05/14/18 10:46 AM  Result Value Ref Range Status   Specimen Description URINE, RANDOM  Final   Special Requests   Final    NONE Performed at [facility], [place] [address], [place], [place] [address]    Culture >=100,000 COLONIES/mL ESCHERICHIA COLI (A)  Final   Report Status 05/16/2018 FINAL  Final   Organism ID, Bacteria ESCHERICHIA COLI (A)  Final      Susceptibility   Escherichia coli - MIC*    AMPICILLIN >=32 RESISTANT Resistant     CEFAZOLIN >=64 RESISTANT Resistant     CEFTRIAXONE <=1 SENSITIVE Sensitive     CIPROFLOXACIN >=4 RESISTANT Resistant     GENTAMICIN <=1 SENSITIVE Sensitive     IMIPENEM <=0.25 SENSITIVE Sensitive     NITROFURANTOIN <=16 SENSITIVE Sensitive     TRIMETH/SULFA >=320 RESISTANT Resistant     AMPICILLIN/SULBACTAM >=32 RESISTANT Resistant     PIP/TAZO >=128 RESISTANT Resistant     Extended ESBL NEGATIVE Sensitive     * >=100,000 COLONIES/mL ESCHERICHIA COLI    RADIOLOGY STUDIES/RESULTS: Dg Chest 2 View  Result Date: 05/14/2018 CLINICAL DATA:  Cardiac palpitations.  History of breast carcinoma EXAM: CHEST - 2 VIEW COMPARISON:  February 12, 2018 FINDINGS: Port-A-Cath tip is  in the superior vena cava. No pneumothorax. There is no edema or consolidation. The heart size and pulmonary vascularity are normal. No adenopathy. Bones appear osteoporotic. No blastic or lytic bone lesions. IMPRESSION: Port-A-Cath tip in superior vena cava. No pneumothorax. No edema or consolidation. No evident adenopathy. Electronically  Signed   By: Lowella Grip III M.D.   On: 05/14/2018 07:51   Ct Angio Chest Pe W And/or Wo Contrast  Result Date: 05/14/2018 CLINICAL DATA:  c/o palpitations starting yesterday, worsening this morning. Patient reports recent stroke in which she received TPA on Monday. She states she was told her blood count was low and she may just need a transfusion. EXAM: CT ANGIOGRAPHY CHEST WITH CONTRAST TECHNIQUE: Multidetector CT imaging of the chest was performed using the standard protocol during bolus administration of intravenous contrast. Multiplanar CT image reconstructions and MIPs were obtained to evaluate the vascular anatomy. CONTRAST:  32mL ISOVUE-370 IOPAMIDOL (ISOVUE-370) INJECTION 76% COMPARISON:  Chest x-ray 05/14/2018 FINDINGS: Cardiovascular: Heart size is normal. The RV: LV ratio is normal (0.89). There is no pericardial effusion. There is minimal atherosclerotic calcification of the coronary arteries. Thoracic aorta is normal in appearance. The pulmonary arteries are well opacified by contrast bolus. There are pulmonary emboli within the RIGHT main pulmonary artery extending into the RIGHT LOWER lobe and RIGHT middle lobe branches. LEFT pulmonary arteries are normally opacified. LEFT-sided Port-A-Cath tip to the LOWER superior vena cava. Mediastinum/Nodes: The visualized portion of the thyroid gland has a normal appearance. The esophagus is normal in appearance. No mediastinal, hilar, or axillary adenopathy. Lungs/Pleura: There is bibasilar atelectasis. Smooth septal wall thickening and diffuse ground-glass opacities are consistent with mild pulmonary edema. Upper  Abdomen: No acute abnormality. Musculoskeletal: No chest wall abnormality. No acute or significant osseous findings. Review of the MIP images confirms the above findings. IMPRESSION: The study is positive for pulmonary embolus. There is no evidence for RIGHT heart strain. Bilateral airspace filling and septal thickening consistent with mild edema. Critical Value/emergent results were called by telephone at the time of interpretation on 05/14/2018 at 10:38 am to [facility] , who verbally acknowledged these results. Electronically Signed   By: Nolon Nations M.D.   On: 05/14/2018 10:41     LOS: 2 days   Signature  Lala Lund M.D on 05/18/2018 at 9:56 AM  Between 7am to 7pm - Pager - [phone] ( page via [email or web address], text pages only, please mention full 10 digit call back number).  After 7pm go to [email or web address] - password [email or web address]

## 2018-05-18 NOTE — Progress Notes (Signed)
[name] is now on heparin.  She had a large ecchymoses on the right upper arm.  This started after she had an attempted IV placement.  Her hemoglobin is dropped quite a bit.  Hemoglobin is 6.6.  Her platelet count is 32,000.  I think she should get 2 units of blood.  This I think would help her.  She got dizzy yesterday.  She is afraid to get out of bed.  She thinks that she will pass out.  Her creatinine is 1.32.  Her calcium is 11 with an albumin of 2.9.  We will have to watch this closely.  May be, some folic acid will also help her.  This may help with her trying to make some blood.  Her white cell count is trending upward.  This might be from her having a bleed.  She is on Rocephin for the E. coli in her urine.  She is a little bit dejected and not been able to go home.  I told her that she really needed to stay because of the urinary tract infection and the E. coli is quite aggressive.  She says she is eating pretty well.  She has had no nausea.  She says she had an episode of emesis yesterday when she got dizzy.  On her physical exam, her temperature is 98.4.  Pulse is 125.  Blood pressure 106/79.  Her lungs are clear bilaterally.  Cardiac exam is tachycardic but regular.  Abdomen is soft.  She has good bowel sounds.  Extremities shows no obvious swelling in her legs.  Skin exam does show the large ecchymoses on the right upper arm.  [name] is on heparin.  I would keep her on heparin for right now.  I think the anticoagulation can be controlled more easily with the heparin.  I appreciate everybody's help with her.  [name], MD  Jeneen Rinks 4:6

## 2018-05-18 NOTE — Progress Notes (Signed)
ANTICOAGULATION CONSULT NOTE - Follow-Up Consult  Pharmacy Consult for heparin Indication: pulmonary embolus and DVT  Allergies  Allergen Reactions  . Bactrim [Sulfamethoxazole-Trimethoprim] Other (See Comments)    ? Renal failure.  . Zofran [Ondansetron] Shortness Of Breath    "felt like throat was closing"  . Clarithromycin Diarrhea and Nausea And Vomiting  . Macrodantin [Nitrofurantoin] Rash    Patient Measurements: Height: 5' (152.4 cm) Weight: 194 lb 7.1 oz (88.2 kg) IBW/kg (Calculated) : 45.5 Heparin Dosing Weight: 66 kg  Vital Signs: Temp: 98.4 F (36.9 C) (06/25 1700) Temp Source: Oral (06/25 1700) BP: 124/85 (06/25 1700) Pulse Rate: 110 (06/25 1700)  Labs: Recent Labs    05/16/18 0454 05/17/18 0500 05/18/18 0439 05/18/18 1757 05/18/18 1822  HGB 9.7* 8.7* 6.6*  --  9.8*  HCT 29.6* 27.7* 20.6*  --  28.9*  PLT 51* 38* 32*  --  25*  HEPARINUNFRC  --   --   --  1.46*  --   CREATININE 1.15* 1.28* 1.32*  --   --     Estimated Creatinine Clearance: 39.8 mL/min (A) (by C-G formula based on SCr of 1.32 mg/dL (H)).  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 The patient was initially started on Lovenox then transitioned to Heparin on 6/24 evening. Heparin was noted to be off this AM due to MD request however now with plans to resume. Hgb down to 6.6 << 8.7, plts 32. Note plans to receive 2 units PRBC this AM and both Oncology and the rounding MD okay with resuming Heparin at this time. Will resume at the previous rate and follow-up with a level this afternoon.   Heparin level post-restart is now elevated at 1.46 on 1050 units/hr. Hgb is up to 9.8 but platelets are down to 25. Per RN, Heparin running in via peripheral line on right and level drawn from port-a-cath on the left. Discussed with Dr. Alvy Bimler (Oncology) and due to  progressive decline in platelet count and risk of bleeding, she gave orders to hold IV Heparin until CBC check in AM when Dr. [name] can re-address.   Noted R-arm bruising/hematoma - will monitor.  No overt bleeding noted.   Goal of Therapy:  Heparin level 0.3-0.7 units/ml Monitor platelets by anticoagulation protocol: Yes   Plan:  - Discussed with Dr. Alvy Bimler (Oncology) and due to progressive decline in platelet count and risk of bleeding, she gave orders to hold IV Heparin until CBC check in AM when Dr. [name] can re-address.  - Will follow-up CBC in AM and follow-up plans with team regarding restart  Thank you for allowing pharmacy to be a part of this patient's care.  Sloan Leiter, PharmD, BCPS, BCCCP Clinical Pharmacist Clinical phone 05/18/2018 until 10:[phone] After hours, please call #[phone] 05/18/2018 8:16 PM

## 2018-05-18 NOTE — Progress Notes (Signed)
ANTICOAGULATION CONSULT NOTE - Follow-Up Consult  Pharmacy Consult for heparin Indication: pulmonary embolus and DVT  Allergies  Allergen Reactions  . Bactrim [Sulfamethoxazole-Trimethoprim] Other (See Comments)    ? Renal failure.  . Zofran [Ondansetron] Shortness Of Breath    "felt like throat was closing"  . Clarithromycin Diarrhea and Nausea And Vomiting  . Macrodantin [Nitrofurantoin] Rash    Patient Measurements: Height: 5' (152.4 cm) Weight: 194 lb 7.1 oz (88.2 kg) IBW/kg (Calculated) : 45.5 Heparin Dosing Weight: 66 kg  Vital Signs: Temp: 98.1 F (36.7 C) (06/25 0811) Temp Source: Oral (06/25 0811) BP: 102/65 (06/25 0811) Pulse Rate: 114 (06/25 0811)  Labs: Recent Labs    05/16/18 0454 05/17/18 0500 05/18/18 0439  HGB 9.7* 8.7* 6.6*  HCT 29.6* 27.7* 20.6*  PLT 51* 38* 32*  CREATININE 1.15* 1.28* 1.32*    Estimated Creatinine Clearance: 39.8 mL/min (A) (by C-G formula based on SCr of 1.32 mg/dL (H)).  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 The patient was initially started on Lovenox then transitioned to Heparin on 6/24 evening.   Heparin was noted to be off this AM due to MD request however now with plans to resume. Hgb down to 6.6 << 8.7, plts 32. Note plans to receive 2 units PRBC this AM and both Oncology and the rounding MD okay with resuming Heparin at this time. Will resume at the previous rate and follow-up with a level this afternoon.   Noted R-arm bruising/hematoma - will monitor.   Goal of Therapy:  Heparin level 0.3-0.7 units/ml Monitor platelets by anticoagulation protocol: Yes   Plan:  - Resume Heparin at 1050 units/hr  - Will watch platelets and Hgb closely along with oncology recommendations - Will continue to monitor for any signs/symptoms of bleeding and will follow up with heparin level in  6 hours   Thank you for allowing pharmacy to be a part of this patient's care.  Alycia Rossetti, PharmD, BCPS Clinical Pharmacist Pager: [phone] Clinical phone for 05/18/2018 from 7a-3:30p: [phone] If after 3:30p, please call main pharmacy at: [phone] 05/18/2018 9:29 AM

## 2018-05-19 ENCOUNTER — Other Ambulatory Visit: Payer: 59

## 2018-05-19 ENCOUNTER — Ambulatory Visit: Payer: 59 | Admitting: Hematology & Oncology

## 2018-05-19 DIAGNOSIS — Z8673 Personal history of transient ischemic attack (TIA), and cerebral infarction without residual deficits: Secondary | ICD-10-CM

## 2018-05-19 DIAGNOSIS — T50905A Adverse effect of unspecified drugs, medicaments and biological substances, initial encounter: Secondary | ICD-10-CM

## 2018-05-19 DIAGNOSIS — R8290 Unspecified abnormal findings in urine: Secondary | ICD-10-CM

## 2018-05-19 DIAGNOSIS — D649 Anemia, unspecified: Secondary | ICD-10-CM

## 2018-05-19 DIAGNOSIS — I2699 Other pulmonary embolism without acute cor pulmonale: Principal | ICD-10-CM

## 2018-05-19 DIAGNOSIS — D6959 Other secondary thrombocytopenia: Secondary | ICD-10-CM

## 2018-05-19 LAB — CBC WITH DIFFERENTIAL/PLATELET
BASOS ABS: 0 10*3/uL (ref 0.0–0.1)
BLASTS: 0 %
Band Neutrophils: 6 %
Basophils Relative: 0 %
Eosinophils Absolute: 0 10*3/uL (ref 0.0–0.7)
Eosinophils Relative: 0 %
HCT: 26.7 % — ABNORMAL LOW (ref 36.0–46.0)
HEMOGLOBIN: 9 g/dL — AB (ref 12.0–15.0)
LYMPHS PCT: 17 %
Lymphs Abs: 2.6 10*3/uL (ref 0.7–4.0)
MCH: 31.1 pg (ref 26.0–34.0)
MCHC: 33.7 g/dL (ref 30.0–36.0)
MCV: 92.4 fL (ref 78.0–100.0)
MONOS PCT: 5 %
Metamyelocytes Relative: 1 %
Monocytes Absolute: 0.8 10*3/uL (ref 0.1–1.0)
Myelocytes: 0 %
NEUTROS ABS: 12.1 10*3/uL — AB (ref 1.7–7.7)
Neutrophils Relative %: 71 %
OTHER: 0 %
Platelets: 29 10*3/uL — CL (ref 150–400)
Promyelocytes Relative: 0 %
RBC: 2.89 MIL/uL — AB (ref 3.87–5.11)
RDW: 20.2 % — ABNORMAL HIGH (ref 11.5–15.5)
SMEAR REVIEW: DECREASED
WBC: 15.5 10*3/uL — AB (ref 4.0–10.5)
nRBC: 0 /100 WBC

## 2018-05-19 LAB — COMPREHENSIVE METABOLIC PANEL
ALT: 24 U/L (ref 0–44)
AST: 26 U/L (ref 15–41)
Albumin: 3 g/dL — ABNORMAL LOW (ref 3.5–5.0)
Alkaline Phosphatase: 67 U/L (ref 38–126)
Anion gap: 8 (ref 5–15)
BUN: 40 mg/dL — AB (ref 8–23)
CO2: 25 mmol/L (ref 22–32)
CREATININE: 1.39 mg/dL — AB (ref 0.44–1.00)
Calcium: 8.2 mg/dL — ABNORMAL LOW (ref 8.9–10.3)
Chloride: 101 mmol/L (ref 98–111)
GFR calc non Af Amer: 38 mL/min — ABNORMAL LOW (ref >60)
GFR, EST AFRICAN AMERICAN: 44 mL/min — AB (ref >60)
Glucose, Bld: 111 mg/dL — ABNORMAL HIGH (ref 70–99)
Potassium: 3.3 mmol/L — ABNORMAL LOW (ref 3.5–5.1)
SODIUM: 134 mmol/L — AB (ref 135–145)
TOTAL PROTEIN: 5 g/dL — AB (ref 6.5–8.1)
Total Bilirubin: 1 mg/dL (ref 0.3–1.2)

## 2018-05-19 LAB — APTT
APTT: 55 s — AB (ref 24–36)
APTT: 45 s — AB (ref 24–36)
aPTT: 57 seconds — ABNORMAL HIGH (ref 24–36)

## 2018-05-19 LAB — HEPARIN LEVEL (UNFRACTIONATED): HEPARIN UNFRACTIONATED: 0.25 [IU]/mL — AB (ref 0.30–0.70)

## 2018-05-19 MED ORDER — SODIUM CHLORIDE 0.9% FLUSH
3.0000 mL | Freq: Two times a day (BID) | INTRAVENOUS | Status: DC
  Administered 2018-05-19 – 2018-05-20 (×3): 3 mL via INTRAVENOUS

## 2018-05-19 MED ORDER — ALUM & MAG HYDROXIDE-SIMETH 200-200-20 MG/5ML PO SUSP
30.0000 mL | ORAL | Status: DC | PRN
  Administered 2018-05-19: 30 mL via ORAL
  Filled 2018-05-19: qty 30

## 2018-05-19 MED ORDER — SODIUM CHLORIDE 0.9% FLUSH: 3.0000 mL | INTRAVENOUS | Status: DC | PRN

## 2018-05-19 MED ORDER — ARGATROBAN 50 MG/50ML IV SOLN
1.0000 ug/kg/min | INTRAVENOUS | Status: DC
  Administered 2018-05-19 – 2018-05-21 (×7): 1 ug/kg/min via INTRAVENOUS
  Filled 2018-05-19 (×8): qty 50

## 2018-05-19 MED ORDER — SODIUM CHLORIDE 0.9 % IV SOLN: 250.0000 mL | INTRAVENOUS | Status: DC | PRN

## 2018-05-19 NOTE — Progress Notes (Addendum)
ANTICOAGULATION CONSULT NOTE - Follow-Up Consult  Pharmacy Consult for heparin Indication: pulmonary embolus and DVT  Allergies  Allergen Reactions  . Bactrim [Sulfamethoxazole-Trimethoprim] Other (See Comments)    ? Renal failure.  . Heparin     HIT  . Zofran [Ondansetron] Shortness Of Breath    "felt like throat was closing"  . Clarithromycin Diarrhea and Nausea And Vomiting  . Macrodantin [Nitrofurantoin] Rash    Patient Measurements: Height: 5' (152.4 cm) Weight: 194 lb 7.1 oz (88.2 kg) IBW/kg (Calculated) : 45.5 Heparin Dosing Weight: 66 kg  Vital Signs: Temp: 98.2 F (36.8 C) (06/26 1554) Temp Source: Oral (06/26 1554) BP: 99/59 (06/26 1554) Pulse Rate: 111 (06/26 1554)  Labs: Recent Labs    05/17/18 0500 05/18/18 0439 05/18/18 1757 05/18/18 1822 05/19/18 0500 05/19/18 1101 05/19/18 1500  HGB 8.7* 6.6*  --  9.8* 9.0*  --   --   HCT 27.7* 20.6*  --  28.9* 26.7*  --   --   PLT 38* 32*  --  25* 29*  --   --   APTT  --   --   --   --   --  55* 45*  HEPARINUNFRC  --   --  1.46*  --  0.25*  --   --   CREATININE 1.28* 1.32*  --   --  1.39*  --   --     Estimated Creatinine Clearance: 37.7 mL/min (A) (by C-G formula based on SCr of 1.39 mg/dL (H)).  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 The patient was initially started on Lovenox then transitioned to Heparin on 6/24 evening. Concerns over low PTLC, pending HIT ab testing  Argatroban initiated this am with a therapeutic initial APTT. On recheck aptt drifted down to subtherapeutic level but per RN, argatroban had briefly run dry prior to level check. No s/s of bleeding noted.   Goal of Therapy:  APTT 50-90 sec Monitor platelets by anticoagulation protocol: Yes   Plan:  Continue argatroban 1 mcg/kg/min 1900 aPTT Monitor bleeding F/U HIT ab - ip  Albertina Parr, PharmD., BCPS Clinical Pharmacist Clinical phone for 05/19/18 until 10:30pm: [phone] If after 10:30pm, please refer to [facility] for unit-specific pharmacist    Addendum: Repeat aPTT is therapeutic at 57s on argatroban 1 mcg/kg/min. No issues with bleeding per RN. Continue argatroban at current rate. F/u repeat aPTT with AM labs.   Albertina Parr, PharmD., BCPS Clinical Pharmacist

## 2018-05-19 NOTE — Progress Notes (Signed)
Marcia [name] is now off the heparin infusion.  Her platelet count is lower.  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  I know this might be a little bit unusual but yet I would have thought that her platelet count would have come up by now.  She clearly needs anticoagulation.  She has a large ecchymoses on the right upper arm.  I think we should have her on argatroban for right now.  I think this would be reasonable.  I would check her for HIT.  She did get 2 units of blood yesterday.  Her labs today are still pending.  Her appetite is marginal.  She did get out of bed yesterday after transfusion.  She felt more confident.  She has had no fever.  I think she completed antibiotics for the E. coli urinary tract infection.  Another urine will be sent off for culture.  He is really not complaining of any shortness of breath.  She had a little bit of diarrhea this morning.  I can tell that she is becoming more depressed about being in the hospital.  This has taken a lot out of her.  Her vital signs show her heart rate to be on the high side.  I probably would check an EKG on her.  When I listen to her heart, it sounds like she had a regular rate and rhythm.  Her temperatures 97.5.  Blood pressure is 105/73.  Her lungs sound clear bilaterally.  Cardiac exam tachycardic but regular.  Abdomen is soft.  There is no fluid wave.  There is no obvious splenomegaly.  Extremities shows no clubbing, cyanosis or edema.  Again, I really believe that she has to be on anticoagulation.  I will try her on argatroban.  I would check her for HIT.  Hopefully, we can still consider her for discharge by the weekend.  [name], MD  Romans 5:3-5

## 2018-05-19 NOTE — Progress Notes (Signed)
ANTICOAGULATION CONSULT NOTE - Follow-Up Consult  Pharmacy Consult for heparin Indication: pulmonary embolus and DVT  Allergies  Allergen Reactions  . Bactrim [Sulfamethoxazole-Trimethoprim] Other (See Comments)    ? Renal failure.  . Heparin     HIT  . Zofran [Ondansetron] Shortness Of Breath    "felt like throat was closing"  . Clarithromycin Diarrhea and Nausea And Vomiting  . Macrodantin [Nitrofurantoin] Rash    Patient Measurements: Height: 5' (152.4 cm) Weight: 194 lb 7.1 oz (88.2 kg) IBW/kg (Calculated) : 45.5 Heparin Dosing Weight: 66 kg  Vital Signs: Temp: 98.3 F (36.8 C) (06/26 0830) Temp Source: Oral (06/26 0830) BP: 107/72 (06/26 0830) Pulse Rate: 105 (06/26 0830)  Labs: Recent Labs    05/17/18 0500 05/18/18 0439 05/18/18 1757 05/18/18 1822 05/19/18 0500 05/19/18 1101  HGB 8.7* 6.6*  --  9.8* 9.0*  --   HCT 27.7* 20.6*  --  28.9* 26.7*  --   PLT 38* 32*  --  25* 29*  --   APTT  --   --   --   --   --  55*  HEPARINUNFRC  --   --  1.46*  --  0.25*  --   CREATININE 1.28* 1.32*  --   --  1.39*  --     Estimated Creatinine Clearance: 37.7 mL/min (A) (by C-G formula based on SCr of 1.39 mg/dL (H)).  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 The patient was initially started on Lovenox then transitioned to Heparin on 6/24 evening. 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

## 2018-05-19 NOTE — Progress Notes (Signed)
ANTICOAGULATION CONSULT NOTE - Follow-Up Consult  Pharmacy Consult for heparin Indication: pulmonary embolus and DVT  Allergies  Allergen Reactions  . Bactrim [Sulfamethoxazole-Trimethoprim] Other (See Comments)    ? Renal failure.  . Zofran [Ondansetron] Shortness Of Breath    "felt like throat was closing"  . Clarithromycin Diarrhea and Nausea And Vomiting  . Macrodantin [Nitrofurantoin] Rash    Patient Measurements: Height: 5' (152.4 cm) Weight: 194 lb 7.1 oz (88.2 kg) IBW/kg (Calculated) : 45.5 Heparin Dosing Weight: 66 kg  Vital Signs: Temp: 97.5 F (36.4 C) (06/25 2348) Temp Source: Oral (06/25 2348) BP: 105/73 (06/25 2348) Pulse Rate: 140 (06/25 2348)  Labs: Recent Labs    05/17/18 0500 05/18/18 0439 05/18/18 1757 05/18/18 1822  HGB 8.7* 6.6*  --  9.8*  HCT 27.7* 20.6*  --  28.9*  PLT 38* 32*  --  25*  HEPARINUNFRC  --   --  1.46*  --   CREATININE 1.28* 1.32*  --   --     Estimated Creatinine Clearance: 39.8 mL/min (A) (by C-G formula based on SCr of 1.32 mg/dL (H)).  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 The patient was initially started on Lovenox then transitioned to Heparin on 6/24 evening. Concerns over low PTLC.  Will start argatroban  Goal of Therapy:  APTT 50-90 sec Monitor platelets by anticoagulation protocol: Yes   Plan:  - Start agatroban at 1 mcg/kg/min -Check aPTT in ~ 2 hours  Thank you for allowing pharmacy to be a part of this patient's care.  Excell Seltzer, PharmD

## 2018-05-19 NOTE — Progress Notes (Signed)
PROGRESS NOTE  SANYA KOBRIN HUT:[id number] DOB: 1951/11/19 DOA: 05/14/2018 PCP: Jilda Panda, MD  HPI  Marcia [name] is a 67 y.o. 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 Course complicated by right arm hematoma, and worsening thrombocytopenia   Interval History Heparin stopped this am due to increasingly lower platelets. 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  73 on presentation currently 59. Does have hematoma on right arm but otherwise no bleeding on changes in mentation. 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 [name]. F/u HIT panel, monitor daily CBC.  Acute PE of right main pulm artery with extension to right lower and middle lobe. Suspect related to breast cancer history and initially was on lovenox but switched to heparin because of worsening anemia and hematoma. Now on argatroban as of today as recommended by her oncologist Dr. 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 Will continue to monitor and cautiously continue anticoagulation. TTE with no right heart strain. Venous duplex bilateral: obstruction bilaterally but unclear if acute or chronic  Large right arm hematoma, stable. Not extending past  the area marked off. At site of previous IV. No pain.   UTI, secondary to E.coli, resolved. Completed 3 days of rocephin  R breast cancer. Follows with Dr. [name]. Last chemotherapy on 05/05/18  History of CVA, s/p tPA on 05/09/18. No residual deficits. Holding aspirin since on anticoagulation and worsening plt count.   Code Status: FULL CODE  Family Communication: Daughter at bedside   Disposition Plan: stable platelets, hgb,     Consultants: Hematology/Oncology  Procedures:  Dopper venous, lower extremities, 05/15/18  Antimicrobials:  none  Cultures:  none  Telemetry: yes  DVT prophylaxis:  argatroban   Objective: Vitals:   05/18/18 1700 05/18/18 2348 05/19/18 0830 05/19/18 1554  BP: 124/85 105/73 107/72 (!) 99/59  Pulse: (!) 110 (!) 140 (!) 105 (!) 111  Resp: 16 18 16 16   Temp: 98.4 F (36.9 C) (!) 97.5 F (36.4 C) 98.3 F (36.8 C) 98.2 F (36.8 C)  TempSrc: Oral Oral Oral Oral  SpO2: 99% 100% 100% 100%  Weight:      Height:        Intake/Output Summary (Last 24 hours) at 05/19/2018 2334 Last data filed at 05/19/2018 2000 Gross per 24 hour  Intake 554.87 ml  Output 1 ml  Net 553.87 ml   Filed Weights   05/14/18 1222  Weight: 88.2 kg (194 lb 7.1 oz)    Exam:  Constitutional:chronically ill appearing female Eyes: EOMI, anicteric, normal conjunctivae Cardiovascular: RRR no MRGs, with no peripheral edema Respiratory: Normal respiratory effort on room air, clear breath sounds  Abdomen: Soft,non-tender, with no HSM Skin: large, Dark bruise on right forearm. Port in place in left chest. No rash ulcers,  or lesions. Without skin tenting  Neurologic: Grossly no focal neuro deficit. Psychiatric:Appropriate affect, and mood. Mental status AAOx3  Data Reviewed: CBC: Recent Labs  Lab 05/16/18 0454 05/17/18 0500 05/18/18 0439 05/18/18 1822 05/19/18 0500  WBC 10.8* 13.6* 16.7* 19.6* 15.5*  NEUTROABS  --  8.7* 12.4*  --  12.1*  HGB 9.7* 8.7* 6.6*  9.8* 9.0*  HCT 29.6* 27.7* 20.6* 28.9* 26.7*  MCV 101.4* 103.7* 104.6* 91.5 92.4  PLT 51* 38* 32* 25* 29*   Basic Metabolic Panel: Recent Labs  Lab 05/15/18 0605 05/16/18 0454 05/17/18 0500 05/18/18 0439 05/19/18 0500  NA 141 140 139 137 134*  K 3.3* 3.5 4.1 3.6 3.3*  CL 108 108 106 104 101  CO2 24 24 24 22 25   GLUCOSE 93 104* 101* 110* 111*  BUN 6 11 23* 34* 40*  CREATININE 1.17* 1.15* 1.28* 1.32* 1.39*  CALCIUM 8.1* 8.5* 8.5* 8.2* 8.2*   GFR: Estimated Creatinine Clearance: 37.7 mL/min (A) (by C-G formula based on SCr of 1.39 mg/dL (H)). Liver Function Tests: Recent Labs  Lab 05/14/18 0925 05/18/18 0439 05/19/18 0500  AST 15 16 26   ALT 14 14 24   ALKPHOS 61 53 67  BILITOT 0.8 0.6 1.0  PROT 5.4* 4.9* 5.0*  ALBUMIN 3.4* 2.9* 3.0*   No results for input(s): LIPASE, AMYLASE in the last 168 hours. No results for input(s): AMMONIA in the last 168 hours. Coagulation Profile: No results for input(s): INR, PROTIME in the last 168 hours. Cardiac Enzymes: No results for input(s): CKTOTAL, CKMB, CKMBINDEX, TROPONINI in the last 168 hours. BNP (last 3 results) No results for input(s): PROBNP in the last 8760 hours. HbA1C: No results for input(s): HGBA1C in the last 72 hours. CBG: No results for input(s): GLUCAP in the last 168 hours. Lipid Profile: No results for input(s): CHOL, HDL, LDLCALC, TRIG, CHOLHDL, LDLDIRECT in the last 72 hours. Thyroid Function Tests: No results for input(s): TSH, T4TOTAL, FREET4, T3FREE, THYROIDAB in the last 72 hours. Anemia Panel: Recent Labs    05/17/18 0712  FERRITIN 820*  TIBC 295  IRON 272*   Urine analysis:    Component Value Date/Time   COLORURINE YELLOW 05/14/2018 [place] 05/14/2018 1042   LABSPEC 1.009 05/14/2018 1042   LABSPEC 1.010 05/02/2009 1206   PHURINE 5.0 05/14/2018 1042   GLUCOSEU NEGATIVE 05/14/2018 1042   HGBUR SMALL (A) 05/14/2018 1042   BILIRUBINUR NEGATIVE 05/14/2018 1042   KETONESUR  NEGATIVE 05/14/2018 1042   PROTEINUR NEGATIVE 05/14/2018 1042   UROBILINOGEN 0.2 03/24/2009 0039   NITRITE POSITIVE (A) 05/14/2018 1042   LEUKOCYTESUR TRACE (A) 05/14/2018 1042   Sepsis Labs: @LABRCNTIP (procalcitonin:4,lacticidven:4)  ) Recent Results (from the past 240 hour(s))  Urine culture     Status: Abnormal   Collection Time: 05/14/18 10:46 AM  Result Value Ref Range Status   Specimen Description URINE, RANDOM  Final   Special Requests   Final    NONE Performed at [facility], [place] [address] [place], [place] [address]    Culture >=100,000 COLONIES/mL ESCHERICHIA COLI (A)  Final   Report Status 05/16/2018 FINAL  Final   Organism ID, Bacteria ESCHERICHIA COLI (A)  Final      Susceptibility   Escherichia coli - MIC*    AMPICILLIN >=32 RESISTANT Resistant     CEFAZOLIN >=64 RESISTANT Resistant     CEFTRIAXONE <=1 SENSITIVE Sensitive     CIPROFLOXACIN >=4 RESISTANT Resistant     GENTAMICIN <=1 SENSITIVE Sensitive  IMIPENEM <=0.25 SENSITIVE Sensitive     NITROFURANTOIN <=16 SENSITIVE Sensitive     TRIMETH/SULFA >=320 RESISTANT Resistant     AMPICILLIN/SULBACTAM >=32 RESISTANT Resistant     PIP/TAZO >=128 RESISTANT Resistant     Extended ESBL NEGATIVE Sensitive     * >=100,000 COLONIES/mL ESCHERICHIA COLI      Studies: No results found.  Scheduled Meds: . sodium chloride   Intravenous Once  . feeding supplement (ENSURE ENLIVE)  237 mL Oral TID BM  . folic acid  2 mg Oral Daily  . sodium chloride flush  3 mL Intravenous Q12H  . sodium chloride flush  3 mL Intravenous Q12H    Continuous Infusions: . sodium chloride    . argatroban 1 mcg/kg/min (05/19/18 2000)     LOS: 3 days     Desiree Hane, MD [facility] Pager [phone]  If 7PM-7AM, please contact night-coverage [email or web address] Password [facility] 05/19/2018, 11:34 PM

## 2018-05-20 DIAGNOSIS — C50911 Malignant neoplasm of unspecified site of right female breast: Secondary | ICD-10-CM

## 2018-05-20 DIAGNOSIS — N390 Urinary tract infection, site not specified: Secondary | ICD-10-CM

## 2018-05-20 LAB — CBC WITH DIFFERENTIAL/PLATELET
Abs Immature Granulocytes: 0.3 10*3/uL — ABNORMAL HIGH (ref 0.0–0.1)
BASOS ABS: 0 10*3/uL (ref 0.0–0.1)
BASOS PCT: 0 %
Eosinophils Absolute: 0 10*3/uL (ref 0.0–0.7)
Eosinophils Relative: 0 %
HCT: 24 % — ABNORMAL LOW (ref 36.0–46.0)
Hemoglobin: 8 g/dL — ABNORMAL LOW (ref 12.0–15.0)
Immature Granulocytes: 2 %
Lymphocytes Relative: 27 %
Lymphs Abs: 3.3 10*3/uL (ref 0.7–4.0)
MCH: 31.4 pg (ref 26.0–34.0)
MCHC: 33.3 g/dL (ref 30.0–36.0)
MCV: 94.1 fL (ref 78.0–100.0)
MONO ABS: 0.7 10*3/uL (ref 0.1–1.0)
Monocytes Relative: 6 %
NEUTROS ABS: 8 10*3/uL — AB (ref 1.7–7.7)
Neutrophils Relative %: 65 %
PLATELETS: 32 10*3/uL — AB (ref 150–400)
RBC: 2.55 MIL/uL — ABNORMAL LOW (ref 3.87–5.11)
RDW: 19.8 % — ABNORMAL HIGH (ref 11.5–15.5)
WBC: 12.3 10*3/uL — ABNORMAL HIGH (ref 4.0–10.5)

## 2018-05-20 LAB — COMPREHENSIVE METABOLIC PANEL
ALT: 41 U/L (ref 0–44)
ANION GAP: 8 (ref 5–15)
AST: 30 U/L (ref 15–41)
Albumin: 2.9 g/dL — ABNORMAL LOW (ref 3.5–5.0)
Alkaline Phosphatase: 73 U/L (ref 38–126)
BUN: 30 mg/dL — ABNORMAL HIGH (ref 8–23)
CHLORIDE: 103 mmol/L (ref 98–111)
CO2: 25 mmol/L (ref 22–32)
Calcium: 7.6 mg/dL — ABNORMAL LOW (ref 8.9–10.3)
Creatinine, Ser: 1.33 mg/dL — ABNORMAL HIGH (ref 0.44–1.00)
GFR calc non Af Amer: 40 mL/min — ABNORMAL LOW (ref >60)
GFR, EST AFRICAN AMERICAN: 46 mL/min — AB (ref >60)
Glucose, Bld: 110 mg/dL — ABNORMAL HIGH (ref 70–99)
POTASSIUM: 2.9 mmol/L — AB (ref 3.5–5.1)
Sodium: 136 mmol/L (ref 135–145)
Total Bilirubin: 0.9 mg/dL (ref 0.3–1.2)
Total Protein: 5.1 g/dL — ABNORMAL LOW (ref 6.5–8.1)

## 2018-05-20 LAB — HEPARIN LEVEL (UNFRACTIONATED): Heparin Unfractionated: <0.1 IU/mL — ABNORMAL LOW (ref 0.30–0.70)

## 2018-05-20 LAB — APTT: APTT: 63 s — AB (ref 24–36)

## 2018-05-20 LAB — HEPARIN INDUCED PLATELET AB (HIT ANTIBODY): HEPARIN INDUCED PLT AB: 0.144 {OD_unit} (ref 0.000–0.400)

## 2018-05-20 MED ORDER — POTASSIUM CHLORIDE 10 MEQ/50ML IV SOLN
10.0000 meq | INTRAVENOUS | Status: AC
  Administered 2018-05-20 (×6): 10 meq via INTRAVENOUS
  Filled 2018-05-20 (×6): qty 50

## 2018-05-20 MED ORDER — LIDOCAINE 4 % EX CREA
TOPICAL_CREAM | Freq: Once | CUTANEOUS | Status: DC
  Filled 2018-05-20: qty 5

## 2018-05-20 NOTE — Progress Notes (Signed)
Nutrition Follow Up  DOCUMENTATION CODES:   Obesity unspecified  INTERVENTION:    Ensure Enlive po TID, each supplement provides 350 kcal and 20 grams of protein  NUTRITION DIAGNOSIS:   Increased nutrient needs related to cancer and cancer related treatments as evidenced by estimated needs, ongoing  GOAL:   Patient will meet greater than or equal to 90% of their needs, met  MONITOR:   PO intake, Supplement acceptance, Labs, Skin, Weight trends, I & O's  ASSESSMENT:   67 yo w/ hx of LC myeloma sp stem cell Tx in 2011 and in remission since then, recently diagnosed with breast cancer in Feb 2019 and has been receiving chemoRx just completed 4/4 cycles 1 week ago on Friday. Presented to ED where CTA of chest showing pulm emboli in the R lung with normal RV/LV ratio.  Pt continues on a Regular diet. PO intake excellent at 100% per flowsheets. She is drinking her Ensure Enlive supplements. Labs and medications reviewed. K 2.9 (L). Hopeful discharge for 6/29.  Diet Order:   Diet Order           Diet regular Room service appropriate? Yes; Fluid consistency: Thin  Diet effective now         EDUCATION NEEDS:   No education needs have been identified at this time  Skin:  Skin Assessment: Reviewed RN Assessment  Last BM:  6/26  Height:   Ht Readings from Last 1 Encounters:  05/14/18 5' (1.524 m)   Weight:   Wt Readings from Last 1 Encounters:  05/14/18 194 lb 7.1 oz (88.2 kg)   Wt Readings from Last 10 Encounters:  05/14/18 194 lb 7.1 oz (88.2 kg)  05/05/18 198 lb 1.9 oz (89.9 kg)  04/14/18 207 lb (93.9 kg)  04/02/18 208 lb 15.9 oz (94.8 kg)  03/30/18 205 lb 8 oz (93.2 kg)  03/09/18 212 lb (96.2 kg)  02/16/18 220 lb (99.8 kg)  02/01/18 218 lb (98.9 kg)  01/04/18 217 lb 12.8 oz (98.8 kg)  07/06/17 213 lb (96.6 kg)   BMI:  Body mass index is 37.98 kg/m.  Estimated Nutritional Needs:   Kcal:  2100-2300  Protein:  105-120 gm  Fluid:  2.1-2.3 L  Katie  Lamberton, RD, LDN Pager #: [phone] After-Hours Pager #: [phone]  

## 2018-05-20 NOTE — Progress Notes (Signed)
PROGRESS NOTE  Marcia [name] AST:[id number] DOB: [birth year] DOA: 05/14/2018 PCP: Jilda Panda, MD  HPI  Marcia [name] is a 67 y.o. 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 Course complicated by right arm hematoma, and worsening thrombocytopenia   Interval History No acute events overnight   ROS:    Subjective Doing well. 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  73 on presentation, now at 54. Does have hematoma on right arm but otherwise no bleeding or changes in mentation. Concern for HIT so d/c'd heparin and started on argatroban as recommended by Hem/Onc. HIT antibodies negative monitor daily CBC.  Acute PE of right main pulm artery with extension to right lower and middle lobe. Suspect related to breast cancer history and initially was on lovenox but switched to heparin because of worsening anemia and hematoma. Now on argatroban due to concern for HITT ( > 50% drop in plts, previously on heparin, new clot). HITT antibodies negative so less likely cause. Will await recommendations by Dr. [name]. Expect will transition to oral anticoagulation.  TTE with no right heart strain. Venous duplex bilateral: obstruction bilaterally but unclear if acute or chronic  Large right arm hematoma, stable. Not extending past the area marked  off. At site of previous IV. No pain.   UTI, secondary to E.coli, resolved. Completed 3 days of rocephin  R breast cancer. Follows with Dr. [name]. Last chemotherapy on 05/05/18  History of CVA, s/p tPA on 05/09/18. No residual deficits. Holding aspirin since on anticoagulation and worsening plt count.   Code Status: FULL CODE  Family Communication: No family at bedside.   Disposition Plan: stable platelets, need to transition to oral anticoagulants prior to going home   Consultants: Hematology/Oncology  Procedures:  Dopper venous, lower extremities, 05/15/18  Antimicrobials:  none  Cultures:  none  Telemetry: yes  DVT prophylaxis:  argatroban   Objective: Vitals:   05/19/18 1554 05/20/18 0005 05/20/18 0805 05/20/18 1714  BP: (!) 99/59 101/67 109/69 102/64  Pulse: (!) 111 (!) 110 99 (!) 113  Resp: 16 20 16 17   Temp: 98.2 F (36.8 C) 98.5 F (36.9 C) 98.1 F (36.7 C) 98.5 F (36.9 C)  TempSrc: Oral Oral Oral Oral  SpO2: 100% 99% 99% 98%  Weight:      Height:        Intake/Output Summary (Last 24 hours) at 05/20/2018 1955 Last data filed at 05/20/2018 1300 Gross per 24 hour  Intake 663.31 ml  Output 3 ml  Net 660.31 ml   Filed Weights   05/14/18 1222  Weight: 88.2 kg (194 lb 7.1 oz)    Exam:  Constitutional:chronically ill appearing female Eyes: EOMI, anicteric, normal conjunctivae Cardiovascular: RRR no MRGs, with no peripheral edema Respiratory: Normal respiratory effort on room air, clear breath sounds  Abdomen: Soft,non-tender, with no HSM Skin: large, Dark bruise on right forearm. Port in place in left  chest. No rash ulcers, or lesions. Without skin tenting  Neurologic: Grossly no focal neuro deficit. Psychiatric:Appropriate affect, and mood. Mental status AAOx3  Data Reviewed: CBC: Recent Labs  Lab 05/17/18 0500 05/18/18 0439 05/18/18 1822 05/19/18 0500 05/20/18 0500  WBC 13.6* 16.7* 19.6* 15.5* 12.3*  NEUTROABS 8.7* 12.4*  --   12.1* 8.0*  HGB 8.7* 6.6* 9.8* 9.0* 8.0*  HCT 27.7* 20.6* 28.9* 26.7* 24.0*  MCV 103.7* 104.6* 91.5 92.4 94.1  PLT 38* 32* 25* 29* 32*   Basic Metabolic Panel: Recent Labs  Lab 05/16/18 0454 05/17/18 0500 05/18/18 0439 05/19/18 0500 05/20/18 0500  NA 140 139 137 134* 136  K 3.5 4.1 3.6 3.3* 2.9*  CL 108 106 104 101 103  CO2 24 24 22 25 25   GLUCOSE 104* 101* 110* 111* 110*  BUN 11 23* 34* 40* 30*  CREATININE 1.15* 1.28* 1.32* 1.39* 1.33*  CALCIUM 8.5* 8.5* 8.2* 8.2* 7.6*   GFR: Estimated Creatinine Clearance: 39.5 mL/min (A) (by C-G formula based on SCr of 1.33 mg/dL (H)). Liver Function Tests: Recent Labs  Lab 05/14/18 0925 05/18/18 0439 05/19/18 0500 05/20/18 0500  AST 15 16 26 30   ALT 14 14 24  41  ALKPHOS 61 53 67 73  BILITOT 0.8 0.6 1.0 0.9  PROT 5.4* 4.9* 5.0* 5.1*  ALBUMIN 3.4* 2.9* 3.0* 2.9*   No results for input(s): LIPASE, AMYLASE in the last 168 hours. No results for input(s): AMMONIA in the last 168 hours. Coagulation Profile: No results for input(s): INR, PROTIME in the last 168 hours. Cardiac Enzymes: No results for input(s): CKTOTAL, CKMB, CKMBINDEX, TROPONINI in the last 168 hours. BNP (last 3 results) No results for input(s): PROBNP in the last 8760 hours. HbA1C: No results for input(s): HGBA1C in the last 72 hours. CBG: No results for input(s): GLUCAP in the last 168 hours. Lipid Profile: No results for input(s): CHOL, HDL, LDLCALC, TRIG, CHOLHDL, LDLDIRECT in the last 72 hours. Thyroid Function Tests: No results for input(s): TSH, T4TOTAL, FREET4, T3FREE, THYROIDAB in the last 72 hours. Anemia Panel: No results for input(s): VITAMINB12, FOLATE, FERRITIN, TIBC, IRON, RETICCTPCT in the last 72 hours. Urine analysis:    Component Value Date/Time   COLORURINE YELLOW 05/14/2018 [place] 05/14/2018 1042   LABSPEC 1.009 05/14/2018 1042   LABSPEC 1.010 05/02/2009 1206   PHURINE 5.0 05/14/2018 1042   GLUCOSEU NEGATIVE  05/14/2018 1042   HGBUR SMALL (A) 05/14/2018 1042   BILIRUBINUR NEGATIVE 05/14/2018 1042   KETONESUR NEGATIVE 05/14/2018 1042   PROTEINUR NEGATIVE 05/14/2018 1042   UROBILINOGEN 0.2 03/24/2009 0039   NITRITE POSITIVE (A) 05/14/2018 1042   LEUKOCYTESUR TRACE (A) 05/14/2018 1042   Sepsis Labs: @LABRCNTIP (procalcitonin:4,lacticidven:4)  ) Recent Results (from the past 240 hour(s))  Urine culture     Status: Abnormal   Collection Time: 05/14/18 10:46 AM  Result Value Ref Range Status   Specimen Description URINE, RANDOM  Final   Special Requests   Final    NONE Performed at [facility], [place] [address] [place],  [address]    Culture >=100,000 COLONIES/mL ESCHERICHIA COLI (A)  Final   Report Status 05/16/2018 FINAL  Final   Organism ID, Bacteria ESCHERICHIA COLI (A)  Final      Susceptibility   Escherichia coli - MIC*    AMPICILLIN >=32 RESISTANT Resistant     CEFAZOLIN >=64 RESISTANT Resistant     CEFTRIAXONE <=1 SENSITIVE Sensitive     CIPROFLOXACIN >=4 RESISTANT Resistant  GENTAMICIN <=1 SENSITIVE Sensitive     IMIPENEM <=0.25 SENSITIVE Sensitive     NITROFURANTOIN <=16 SENSITIVE Sensitive     TRIMETH/SULFA >=320 RESISTANT Resistant     AMPICILLIN/SULBACTAM >=32 RESISTANT Resistant     PIP/TAZO >=128 RESISTANT Resistant     Extended ESBL NEGATIVE Sensitive     * >=100,000 COLONIES/mL ESCHERICHIA COLI      Studies: No results found.  Scheduled Meds: . sodium chloride   Intravenous Once  . feeding supplement (ENSURE ENLIVE)  237 mL Oral TID BM  . folic acid  2 mg Oral Daily  . sodium chloride flush  3 mL Intravenous Q12H  . sodium chloride flush  3 mL Intravenous Q12H    Continuous Infusions: . sodium chloride    . argatroban 1 mcg/kg/min (05/20/18 1456)     LOS: 4 days     Desiree Hane, MD [facility] Pager [phone]  If 7PM-7AM, please contact night-coverage [email or web address] Password [facility] 05/20/2018, 7:55 PM

## 2018-05-20 NOTE — Progress Notes (Signed)
Marcia [name] is much more perkier today.  I am happy for her.  She is on argatroban.  We are making sure she does not have HIT.  I think this would be a little unlikely but still it might help explain why her platelet count has been dropping.  Her platelet count today is 32,000.  Her hemoglobin is 8.  Creatinine 1.33.  Her potassium is 2.9.  We will replace this.  The ecchymoses on her right arm is about the same.  There is no fever.  She has had no bleeding.  She has had no nausea or vomiting.  She really wants to walk.  She wants to be able to get out of bed.  I think she should be able to do this.  I do not think her thrombocytopenia should prevent her from getting out of bed and walking.  She has had no vomiting.  She had a little bit of nausea.  Her vital signs all look stable.  Her heart rate is about 94.  Temperature 98.5.  Blood pressure 101/67.  Her lungs are clear.  Cardiac exam regular rate and rhythm.  She has no murmurs.  Abdomen is soft.  She has mildly obese.  Extremities shows some trace edema bilaterally.  I would continue her on the argatroban today.  If her platelet count is further increased tomorrow, I probably would consider her for Eliquis.  I think this would be reasonable option as outpatient therapy for anticoagulation.  It would be effective and it would be easy for her.  She really is hoping to go home by Saturday.  If her platelet count continues to rise, I think this would be an option.  [name], MD  Romans 8:11

## 2018-05-20 NOTE — Progress Notes (Signed)
ANTICOAGULATION CONSULT NOTE - Follow-Up Consult  Pharmacy Consult for heparin Indication: pulmonary embolus and DVT  Allergies  Allergen Reactions  . Bactrim [Sulfamethoxazole-Trimethoprim] Other (See Comments)    ? Renal failure.  . Heparin     HIT  . Zofran [Ondansetron] Shortness Of Breath    "felt like throat was closing"  . Clarithromycin Diarrhea and Nausea And Vomiting  . Macrodantin [Nitrofurantoin] Rash    Patient Measurements: Height: 5' (152.4 cm) Weight: 194 lb 7.1 oz (88.2 kg) IBW/kg (Calculated) : 45.5 Heparin Dosing Weight: 66 kg  Vital Signs: Temp: 98.1 F (36.7 C) (06/27 0805) Temp Source: Oral (06/27 0805) BP: 109/69 (06/27 0805) Pulse Rate: 99 (06/27 0805)  Labs: Recent Labs    05/18/18 0439 05/18/18 1757 05/18/18 1822 05/19/18 0500  05/19/18 1500 05/19/18 2003 05/20/18 0500  HGB 6.6*  --  9.8* 9.0*  --   --   --  8.0*  HCT 20.6*  --  28.9* 26.7*  --   --   --  24.0*  PLT 32*  --  25* 29*  --   --   --  32*  APTT  --   --   --   --    < > 45* 57* 63*  HEPARINUNFRC  --  1.46*  --  0.25*  --   --   --  <0.10*  CREATININE 1.32*  --   --  1.39*  --   --   --  1.33*   < > = values in this interval not displayed.    Estimated Creatinine Clearance: 39.5 mL/min (A) (by C-G formula based on SCr of 1.33 mg/dL (H)).  Assessment: 13 YOF with active breast CA on treatment and recent CVA s/p tPA 6/16 who presented on 6/21 with heart palpitations and SOB and was found to have an acute B/L PE and new DVTs. The patient was initially started on Lovenox then transitioned to Heparin on 6/24 evening. Concerns over low PTLC, pending HIT ab testing  Argatroban continues, PTT therapeutic  Goal of Therapy:  APTT 50-90 sec Monitor platelets by anticoagulation protocol: Yes   Plan:  Continue argatroban 1 mcg/kg/min Follow up AM PTT and transition to Eliquis Monitor bleeding F/U HIT ab - ip  Thank you Anette Guarneri, PharmD [phone]

## 2018-05-21 LAB — BASIC METABOLIC PANEL
Anion gap: 7 (ref 5–15)
BUN: 21 mg/dL (ref 8–23)
CO2: 25 mmol/L (ref 22–32)
Calcium: 7.8 mg/dL — ABNORMAL LOW (ref 8.9–10.3)
Chloride: 105 mmol/L (ref 98–111)
Creatinine, Ser: 1.25 mg/dL — ABNORMAL HIGH (ref 0.44–1.00)
GFR calc Af Amer: 50 mL/min — ABNORMAL LOW (ref >60)
GFR calc non Af Amer: 43 mL/min — ABNORMAL LOW (ref >60)
Glucose, Bld: 105 mg/dL — ABNORMAL HIGH (ref 70–99)
Potassium: 3.3 mmol/L — ABNORMAL LOW (ref 3.5–5.1)
Sodium: 137 mmol/L (ref 135–145)

## 2018-05-21 LAB — CBC
HEMATOCRIT: 22.8 % — AB (ref 36.0–46.0)
HEMOGLOBIN: 7.4 g/dL — AB (ref 12.0–15.0)
MCH: 31.8 pg (ref 26.0–34.0)
MCHC: 32.5 g/dL (ref 30.0–36.0)
MCV: 97.9 fL (ref 78.0–100.0)
Platelets: 42 10*3/uL — ABNORMAL LOW (ref 150–400)
RBC: 2.33 MIL/uL — ABNORMAL LOW (ref 3.87–5.11)
RDW: 19.9 % — ABNORMAL HIGH (ref 11.5–15.5)
WBC: 11.9 10*3/uL — ABNORMAL HIGH (ref 4.0–10.5)

## 2018-05-21 LAB — HEPARIN LEVEL (UNFRACTIONATED): Heparin Unfractionated: <0.1 IU/mL — ABNORMAL LOW (ref 0.30–0.70)

## 2018-05-21 LAB — PREPARE RBC (CROSSMATCH)

## 2018-05-21 LAB — TYPE AND SCREEN
ABO/RH(D): AB POS
Antibody Screen: NEGATIVE

## 2018-05-21 LAB — APTT: APTT: 63 s — AB (ref 24–36)

## 2018-05-21 LAB — HEMOGLOBIN AND HEMATOCRIT, BLOOD
HCT: 32.1 % — ABNORMAL LOW (ref 36.0–46.0)
HEMOGLOBIN: 10.5 g/dL — AB (ref 12.0–15.0)

## 2018-05-21 MED ORDER — SODIUM CHLORIDE 0.9 % IV SOLN
510.0000 mg | Freq: Once | INTRAVENOUS | Status: AC
  Administered 2018-05-21: 510 mg via INTRAVENOUS
  Filled 2018-05-21: qty 17

## 2018-05-21 MED ORDER — POTASSIUM CHLORIDE 10 MEQ/50ML IV SOLN
10.0000 meq | INTRAVENOUS | Status: AC
  Administered 2018-05-21 (×4): 10 meq via INTRAVENOUS
  Filled 2018-05-21 (×4): qty 50

## 2018-05-21 MED ORDER — APIXABAN 5 MG PO TABS
5.0000 mg | ORAL_TABLET | Freq: Two times a day (BID) | ORAL | Status: DC
  Administered 2018-05-21 – 2018-05-22 (×3): 5 mg via ORAL
  Filled 2018-05-21 (×3): qty 1

## 2018-05-21 MED ORDER — FUROSEMIDE 10 MG/ML IJ SOLN
20.0000 mg | Freq: Once | INTRAMUSCULAR | Status: AC
  Administered 2018-05-21: 20 mg via INTRAVENOUS
  Filled 2018-05-21: qty 2

## 2018-05-21 MED ORDER — SODIUM CHLORIDE 0.9% IV SOLUTION: Freq: Once | INTRAVENOUS | Status: DC

## 2018-05-21 NOTE — Discharge Instructions (Signed)
Information on my medicine - ELIQUIS® (apixaban) ° °This medication education was reviewed with me or my healthcare representative as part of my discharge preparation.  The pharmacist that spoke with me during my hospital stay was:  Mia Milan Kay, RPH ° °Why was Eliquis® prescribed for you? °Eliquis® was prescribed for you to reduce the risk of forming blood clots that can cause a stroke if you have a medical condition called atrial fibrillation (a type of irregular heartbeat) OR to reduce the risk of a blood clots forming after orthopedic surgery. ° °What do You need to know about Eliquis® ? °Take your Eliquis® TWICE DAILY - one tablet in the morning and one tablet in the evening with or without food.  It would be best to take the doses about the same time each day. ° °If you have difficulty swallowing the tablet whole please discuss with your pharmacist how to take the medication safely. ° °Take Eliquis® exactly as prescribed by your doctor and DO NOT stop taking Eliquis® without talking to the doctor who prescribed the medication.  Stopping may increase your risk of developing a new clot or stroke.  Refill your prescription before you run out. ° °After discharge, you should have regular check-up appointments with your healthcare provider that is prescribing your Eliquis®.  In the future your dose may need to be changed if your kidney function or weight changes by a significant amount or as you get older. ° °What do you do if you miss a dose? °If you miss a dose, take it as soon as you remember on the same day and resume taking twice daily.  Do not take more than one dose of ELIQUIS at the same time. ° °Important Safety Information °A possible side effect of Eliquis® is bleeding. You should call your healthcare provider right away if you experience any of the following: °? Bleeding from an injury or your nose that does not stop. °? Unusual colored urine (red or dark brown) or unusual colored stools (red or  black). °? Unusual bruising for unknown reasons. °? A serious fall or if you hit your head (even if there is no bleeding). ° °Some medicines may interact with Eliquis® and might increase your risk of bleeding or clotting while on Eliquis®. To help avoid this, consult your healthcare provider or pharmacist prior to using any new prescription or non-prescription medications, including herbals, vitamins, non-steroidal anti-inflammatory drugs (NSAIDs) and supplements. ° °This website has more information on Eliquis® (apixaban): [email or web address].com. ° ° °

## 2018-05-21 NOTE — Progress Notes (Signed)
PROGRESS NOTE  Marcia [name] TKP:[id number] DOB: 04/30/1951 DOA: 05/14/2018 PCP: Marcia Panda, MD  HPI  Marcia [name] is a 67 y.o. 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  HIT antibody negative. Stopped argatroban.   Acute PE of right main pulm artery with extension to right lower and middle lobe. Likely malignancy related. No whtat HIT ruled out transitioning to oral eliquis and monitor on that.  Encouraged since no right heart strain on TTE. Doppler shows clots bilaterally but unable to state acuity. Appreciate Dr. Antonieta [name] recommendations  Acute on chronic anemia. Hgb of 7 this am. Given 2 U PRBC with improvement to 10.5 this evening. Repeat in am  Large right arm hematoma, stable. Not extending past the area marked off. At site of previous IV. No pain.   UTI, secondary to E.coli, resolved. Completed 3 days of rocephin  R breast cancer. Follows with Dr. Marin [name]. Last chemotherapy on 05/05/18  History of CVA, s/p tPA on 05/09/18. No residual deficits. Holding aspirin since on anticoagulation and worsening plt count.   Code  Status: FULL CODE  Family Communication: No family at bedside.   Disposition Plan: stable platelets, stable hgb, no complications on eliquis. Hopeful for discharge home this weekend   Consultants: Hematology/Oncology  Procedures:  Dopper venous, lower extremities, 05/15/18  Antimicrobials:  none  Cultures:  none  Telemetry: yes  DVT prophylaxis: eliquis   Objective: Vitals:   05/21/18 1113 05/21/18 1211 05/21/18 1232 05/21/18 1508  BP: 97/64 95/62 106/72 (!) 90/57  Pulse: 98 100 (!) 105 65  Resp: 18 16 15 18   Temp: 98.2 F (36.8 C) 98.1 F (36.7 C) 98 F (36.7 C) 98.4 F (36.9 C)  TempSrc: Oral Oral Oral Oral  SpO2: 98% 98% 100% 99%  Weight:      Height:        Intake/Output Summary (Last 24 hours) at 05/21/2018 2058 Last data filed at 05/21/2018 1600 Gross per 24 hour  Intake 1073.45 ml  Output -  Net 1073.45 ml   Filed Weights   05/14/18 1222  Weight: 88.2 kg (194 lb 7.1 oz)    Exam:  Constitutional:chronically ill appearing female Eyes: EOMI, anicteric, normal conjunctivae Cardiovascular: RRR no MRGs, with no peripheral edema Respiratory: Normal respiratory effort on room air, clear breath sounds  Abdomen: Soft,non-tender, with no HSM Skin: large, Dark bruise on right forearm. Port in place in left chest. No rash ulcers, or lesions. Without skin tenting  Neurologic: Grossly no focal neuro deficit. Psychiatric:Appropriate affect, and mood. Mental status AAOx3  Data Reviewed: CBC: Recent Labs  Lab 05/17/18 0500 05/18/18 0439 05/18/18 1822 05/19/18 0500 05/20/18 0500 05/21/18 0414 05/21/18 1940  WBC 13.6* 16.7* 19.6* 15.5* 12.3* 11.9*  --  NEUTROABS 8.7* 12.4*  --  12.1* 8.0*  --   --   HGB 8.7* 6.6* 9.8* 9.0* 8.0* 7.4* 10.5*  HCT 27.7* 20.6* 28.9* 26.7* 24.0* 22.8* 32.1*  MCV 103.7* 104.6* 91.5 92.4 94.1 97.9  --   PLT 38* 32* 25* 29* 32* 42*  --    Basic Metabolic Panel: Recent Labs  Lab 05/17/18 0500 05/18/18 0439  05/19/18 0500 05/20/18 0500 05/21/18 0528  NA 139 137 134* 136 137  K 4.1 3.6 3.3* 2.9* 3.3*  CL 106 104 101 103 105  CO2 24 22 25 25 25   GLUCOSE 101* 110* 111* 110* 105*  BUN 23* 34* 40* 30* 21  CREATININE 1.28* 1.32* 1.39* 1.33* 1.25*  CALCIUM 8.5* 8.2* 8.2* 7.6* 7.8*   GFR: Estimated Creatinine Clearance: 42 mL/min (A) (by C-G formula based on SCr of 1.25 mg/dL (H)). Liver Function Tests: Recent Labs  Lab 05/18/18 0439 05/19/18 0500 05/20/18 0500  AST 16 26 30   ALT 14 24 41  ALKPHOS 53 67 73  BILITOT 0.6 1.0 0.9  PROT 4.9* 5.0* 5.1*  ALBUMIN 2.9* 3.0* 2.9*   No results for input(s): LIPASE, AMYLASE in the last 168 hours. No results for input(s): AMMONIA in the last 168 hours. Coagulation Profile: No results for input(s): INR, PROTIME in the last 168 hours. Cardiac Enzymes: No results for input(s): CKTOTAL, CKMB, CKMBINDEX, TROPONINI in the last 168 hours. BNP (last 3 results) No results for input(s): PROBNP in the last 8760 hours. HbA1C: No results for input(s): HGBA1C in the last 72 hours. CBG: No results for input(s): GLUCAP in the last 168 hours. Lipid Profile: No results for input(s): CHOL, HDL, LDLCALC, TRIG, CHOLHDL, LDLDIRECT in the last 72 hours. Thyroid Function Tests: No results for input(s): TSH, T4TOTAL, FREET4, T3FREE, THYROIDAB in the last 72 hours. Anemia Panel: No results for input(s): VITAMINB12, FOLATE, FERRITIN, TIBC, IRON, RETICCTPCT in the last 72 hours. Urine analysis:    Component Value Date/Time   COLORURINE YELLOW 05/14/2018 [place] 05/14/2018 1042   LABSPEC 1.009 05/14/2018 1042   LABSPEC 1.010 05/02/2009 1206   PHURINE 5.0 05/14/2018 1042   GLUCOSEU NEGATIVE 05/14/2018 1042   HGBUR SMALL (A) 05/14/2018 1042   BILIRUBINUR NEGATIVE 05/14/2018 1042   KETONESUR NEGATIVE 05/14/2018 1042   PROTEINUR NEGATIVE 05/14/2018 1042   UROBILINOGEN 0.2 03/24/2009 0039   NITRITE POSITIVE (A) 05/14/2018 1042   LEUKOCYTESUR  TRACE (A) 05/14/2018 1042   Sepsis Labs: @LABRCNTIP (procalcitonin:4,lacticidven:4)  ) Recent Results (from the past 240 hour(s))  Urine culture     Status: Abnormal   Collection Time: 05/14/18 10:46 AM  Result Value Ref Range Status   Specimen Description URINE, RANDOM  Final   Special Requests   Final    NONE Performed at [facility], [place] [address] [place], [place] [address]    Culture >=100,000 COLONIES/mL ESCHERICHIA COLI (A)  Final   Report Status 05/16/2018 FINAL  Final   Organism ID, Bacteria ESCHERICHIA COLI (A)  Final      Susceptibility   Escherichia coli - MIC*    AMPICILLIN >=32 RESISTANT Resistant     CEFAZOLIN >=64 RESISTANT Resistant     CEFTRIAXONE <=1 SENSITIVE Sensitive     CIPROFLOXACIN >=4 RESISTANT Resistant     GENTAMICIN <=1 SENSITIVE Sensitive     IMIPENEM <=0.25 SENSITIVE Sensitive     NITROFURANTOIN <=16 SENSITIVE Sensitive     TRIMETH/SULFA >=320 RESISTANT Resistant     AMPICILLIN/SULBACTAM >=32 RESISTANT Resistant  PIP/TAZO >=128 RESISTANT Resistant     Extended ESBL NEGATIVE Sensitive     * >=100,000 COLONIES/mL ESCHERICHIA COLI      Studies: No results found.  Scheduled Meds: . sodium chloride   Intravenous Once  . sodium chloride   Intravenous Once  . apixaban  5 mg Oral BID  . feeding supplement (ENSURE ENLIVE)  237 mL Oral TID BM  . folic acid  2 mg Oral Daily  . sodium chloride flush  3 mL Intravenous Q12H  . sodium chloride flush  3 mL Intravenous Q12H    Continuous Infusions: . sodium chloride       LOS: 5 days     Desiree Hane, MD [facility] Pager [phone]  If 7PM-7AM, please contact night-coverage [email or web address] Password [facility] 05/21/2018, 8:58 PM

## 2018-05-21 NOTE — Progress Notes (Signed)
Marcia [name] continues to improve.  She is feeling [name].  She is looking forward to being able to go home soon.  Her platelet count is slowly trending upward.  It is now 42,000.  Her hemoglobin is going down.  It is 7.4.  I think she probably will need to have another couple pints of blood.  I talked to her about this.  She is in agreement.  She probably needs some IV iron also.  Her potassium is 3.3.  I probably would give her a little extra potassium.  I think we can now get her off the argatroban and get her onto something oral.  I think 1 of the new oral anticoagulants would be perfect for her.  I would favor Eliquis.  I would not give her a loading dose.  Her diarrhea is [name].  She is eating fairly well.  She is had no bleeding.  The right upper arm seems to be improving slowly.  She has had no cough.  Is no shortness of breath.  She is out of bed.  Hopefully, she will be able to go home this weekend.  She really would like to go home tomorrow.  I would think that this should be okay from my point of view.  We will see how she does with the Eliquis today.  Her physical exam shows stable vital signs.  Pulse is 112.  Her right upper arm and does show the large ecchymoses.  It does seem to be regressing.  It is not as dark.  Her cardiac exam slightly tachycardic but regular.  Abdomen is soft.  She has decent bowel sounds.  Marcia [name] is hypercoagulable.  I suspect this probably is from her underlying malignancy.  Hopefully, she will go home this weekend.  She will go home on Eliquis.  I talked to her today about Eliquis.  She is in agreement.  We will plan to see her back in the office in a couple weeks after discharge.  The real question now is trying to get her to surgery after she has had these thromboembolic events.  Marcia Haw, MD  [place] 4:6

## 2018-05-21 NOTE — Progress Notes (Addendum)
Benefit check for NOAC in progressAneta Mins [phone]   RE: Benefit check  Received: Today  Message Contents  Delorse Lek CMA        Per rep at [facility]:   Xarelto: tier 2/ covered/ $50 for 30 day retail/ no auth required   Eliquis: tier 4/ covered/ $200 for 30 day retail/ no auth required

## 2018-05-22 DIAGNOSIS — I2602 Saddle embolus of pulmonary artery with acute cor pulmonale: Secondary | ICD-10-CM

## 2018-05-22 LAB — BASIC METABOLIC PANEL
ANION GAP: 7 (ref 5–15)
BUN: 16 mg/dL (ref 8–23)
CO2: 26 mmol/L (ref 22–32)
Calcium: 7.9 mg/dL — ABNORMAL LOW (ref 8.9–10.3)
Chloride: 104 mmol/L (ref 98–111)
Creatinine, Ser: 1.23 mg/dL — ABNORMAL HIGH (ref 0.44–1.00)
GFR calc non Af Amer: 44 mL/min — ABNORMAL LOW (ref >60)
GFR, EST AFRICAN AMERICAN: 51 mL/min — AB (ref >60)
GLUCOSE: 98 mg/dL (ref 70–99)
POTASSIUM: 3.4 mmol/L — AB (ref 3.5–5.1)
Sodium: 137 mmol/L (ref 135–145)

## 2018-05-22 LAB — BPAM RBC
BLOOD PRODUCT EXPIRATION DATE: 201906302359
Blood Product Expiration Date: 201907172359
Blood Product Expiration Date: 201907222359
Blood Product Expiration Date: 201907222359
ISSUE DATE / TIME: 201906251332
ISSUE DATE / TIME: 201906280828
ISSUE DATE / TIME: 201906251040
ISSUE DATE / TIME: 201906281206
UNIT TYPE AND RH: 6200
UNIT TYPE AND RH: 6200
UNIT TYPE AND RH: 8400
UNIT TYPE AND RH: 8400

## 2018-05-22 LAB — CBC
HCT: 31.5 % — ABNORMAL LOW (ref 36.0–46.0)
Hemoglobin: 10 g/dL — ABNORMAL LOW (ref 12.0–15.0)
MCH: 29.9 pg (ref 26.0–34.0)
MCHC: 31.7 g/dL (ref 30.0–36.0)
MCV: 94 fL (ref 78.0–100.0)
Platelets: 59 10*3/uL — ABNORMAL LOW (ref 150–400)
RBC: 3.35 MIL/uL — AB (ref 3.87–5.11)
RDW: 18.9 % — ABNORMAL HIGH (ref 11.5–15.5)
WBC: 9.9 10*3/uL (ref 4.0–10.5)

## 2018-05-22 LAB — TYPE AND SCREEN
ABO/RH(D): AB POS
Antibody Screen: NEGATIVE
UNIT DIVISION: 0
UNIT DIVISION: 0
Unit division: 0
Unit division: 0

## 2018-05-22 MED ORDER — ENSURE ENLIVE PO LIQD: 237.0000 mL | Freq: Three times a day (TID) | ORAL | 12 refills | Status: DC

## 2018-05-22 MED ORDER — ANTICOAGULANT SODIUM CITRATE 4% (200MG/5ML) IV SOLN
5.0000 mL | Freq: Once | Status: AC
  Administered 2018-05-22: 5 mL via INTRAVENOUS
  Filled 2018-05-22: qty 5

## 2018-05-22 MED ORDER — FOLIC ACID 1 MG PO TABS: 2.0000 mg | ORAL_TABLET | Freq: Every day | ORAL | 0 refills | Status: DC

## 2018-05-22 MED ORDER — APIXABAN 5 MG PO TABS: 5.0000 mg | ORAL_TABLET | Freq: Two times a day (BID) | ORAL | 1 refills | Status: DC

## 2018-05-22 NOTE — Discharge Summary (Signed)
Discharge Summary  Marcia [name] MWU:[id number] DOB: 07-Feb-1951  PCP: Jilda Panda, MD  Admit date: 05/14/2018 Discharge date: 05/22/2018   Time spent: < 25 minutes  Admitted From: Home Disposition: Home  Recommendations for Outpatient Follow-up:  1. Follow up with PCP in 1 to 2 weeks 2. Follow with Dr. [name] in 2 to 3 weeks.   3. Eliquis on discharge. Holding aspirin    Discharge Diagnoses:  Active Hospital Problems   Diagnosis Date Noted  . Pulmonary embolism ([place]) 05/14/2018  . Pulmonary emboli ([place]) 05/16/2018  . Symptomatic anemia 05/14/2018  . Infiltrating duct and lobular carcinoma, right ([place]) 05/14/2018  . 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 Thrombocytopenia due to drugs 05/14/2018  . Abnormal urinalysis 05/14/2018    Resolved Hospital Problems  No resolved problems to display.    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 Hospital Course complicated by right arm hematoma, and worsening thrombocytopenia.  Hospital Course:   Acute PE, right main pulmonary artery with extension to right lower middle lobe.  Confirmed on CTA.  TTE with no right heart strain.  Dopplers show bilateral clots, but unable to specify acuity.  Initially managed on Lovenox but was switched to heparin because of worsening anemia and right arm hematoma.  Patient was briefly  transitioned to our argatroban in the setting of acute worsening of thrombocytopenia.  HIT was ruled out with negative antibody.  Patient tolerated transitioning to oral anticoagulation with Eliquis with stabilization of platelets in the next 24 hours.  Patient did require 2 units packed red blood cells transfusion due to hemoglobin of 7 was found to have no active bleeding.  Patient will follow with Dr. [name] to 3 weeks  Acute on chronic anemia.  Hemoglobin slowly downtrending to 7.5.  Patient received 2 units packed red blood cell transfusion and tolerated well. Was also given IV iron as managed by Dr. [name].  Hemoglobin remained stable at 10.  No active signs of bleeding while on anticoagulation throughout hospital stay  Thrombocytopenia, improving.  HIT ruled out with negative HIT antibody on admission.  Unclear if potential side effect of recent chemotherapy.  Uptrending on day of discharge with no active signs of bleeding  Large right arm hematoma, stable.  At previous IV site.  Improving in color and size.  We will continue to monitor  UTI, secondary to E. coli, resolved.  Completed 3 days of Rocephin  Right breast cancer.  Followed by Dr. [name] last chemotherapy session 05/05/2018.  We will continue outpatient follow-up.  History of CVA status post TPA 05/09/2018.  No residual deficits.  Will continue on Eliquis as outpatient.  Instructed to hold on aspirin on discharge   Consultations:  Hematology/Oncology  Procedures/Studies:  Dopper venous, lower extremities, 05/15/18: Final Interpretation: Right: Ultrasound is unable to distinguish whether obstruction in the Common Femoral vein, Popliteal vein, Posterior Tibial veins, and  Peroneal veins is acute or chronic. Unable to visualize right external iliac secondary to bowel gas and body habitus. Left: Ultrasound is unable to distinguish whether obstruction in the Popliteal vein is acute or chronic.   TTE 05/15/18: Left ventricle: The  cavity size was normal. Systolic function was   normal. The estimated ejection fraction was in the range of 55%   to 60%. - Pericardium, extracardiac: A trivial pericardial effusion was   identified.       Discharge Exam: BP 113/77 (BP Location: Left Arm)   Pulse 95   Temp 97.8 F (36.6 C) (Oral)   Resp 18   Ht 5' (1.524 m)   Wt 88.2 kg (194 lb 7.1 oz)   SpO2 100%   BMI 37.98 kg/m   General: Lying in bed, no apparent distress Eyes: EOMI, anicteric ENT: Oral Mucosa clear and moist Cardiovascular: regular rate and rhythm, no murmurs, rubs or gallops, trace edema bilaterally of calves Respiratory: Normal respiratory effort on room air, lungs clear to auscultation bilaterally Abdomen: soft, non-distended, non-tender, normal bowel sounds Skin: Dark bruise on right forearm. Port in place in left chest Neurologic: Grossly no focal neuro deficit.Mental status AAOx3, speech normal, Psychiatric:Appropriate affect, and mood   Discharge Instructions You were cared for by a hospitalist during your hospital stay. If you have any questions about your discharge medications or the care you received while you were in the hospital after you are discharged, you can call the unit and asked to speak with the hospitalist on call if the hospitalist that took care of you is not available. Once you are discharged, your primary care physician will handle any further medical issues. Please note that NO REFILLS for any discharge medications will be authorized once you are discharged, as it is imperative that you return to your primary care physician (or establish a relationship with a primary care physician if you do not have one) for your aftercare needs so that they can reassess your need for medications and monitor your lab values.   Allergies as of 05/22/2018      Reactions   Bactrim [sulfamethoxazole-trimethoprim] Other (See Comments)   ? Renal failure.   Heparin    HIT   Zofran [ondansetron]  Shortness Of Breath   "felt like throat was closing"   Clarithromycin Diarrhea, Nausea And Vomiting   Macrodantin [nitrofurantoin] Rash      Medication List    STOP taking these medications   aspirin EC 81 MG tablet     TAKE these medications   apixaban 5 MG Tabs tablet Commonly known as:  ELIQUIS Take 1 tablet (5 mg total) by mouth 2 (two) times daily.   doxylamine (Sleep) 25 MG tablet Commonly known as:  UNISOM Take 25 mg by mouth at bedtime as needed for sleep.   feeding supplement (ENSURE ENLIVE) Liqd Take 237 mLs by mouth 3 (three) times daily between meals.   folic acid 1 MG tablet Commonly known as:  FOLVITE Take 2 tablets (2 mg total) by mouth daily. Start taking on:  05/23/2018   lidocaine-prilocaine cream Commonly known as:  EMLA Apply to affected area once   LORazepam 0.5 MG tablet Commonly known as:  ATIVAN TAKE 1 TABLET BY MOUTH EVERY 6 HOURS AS NEEDED FOR NAUSEA/VOMITING   magic mouthwash Soln Take 5 mLs by mouth 4 (four) times daily as needed for mouth pain. Components benadryl  525 mg, hydrocortisone 60 mg and nystatin 0.6 mg. 240 ml What changed:  additional  instructions   magic mouthwash Soln Take 5 mLs by mouth 4 (four) times daily. [place] What changed:  Another medication with the same name was changed. Make sure you understand how and when to take each.   prochlorperazine 10 MG tablet Commonly known as:  COMPAZINE TAKE 1 TABLET (10 MG TOTAL) BY MOUTH EVERY 6 (SIX) HOURS AS NEEDED (NAUSEA OR VOMITING).      Allergies  Allergen Reactions  . Bactrim [Sulfamethoxazole-Trimethoprim] Other (See Comments)    ? Renal failure.  . Heparin     HIT  . Zofran [Ondansetron] Shortness Of Breath    "felt like throat was closing"  . Clarithromycin Diarrhea and Nausea And Vomiting  . Macrodantin [Nitrofurantoin] Rash      The results of significant diagnostics from this hospitalization (including imaging, microbiology, ancillary and  laboratory) are listed below for reference.    Significant Diagnostic Studies: Dg Chest 2 View  Result Date: 05/14/2018 CLINICAL DATA:  Cardiac palpitations.  History of breast carcinoma EXAM: CHEST - 2 VIEW COMPARISON:  February 12, 2018 FINDINGS: Port-A-Cath tip is in the superior vena cava. No pneumothorax. There is no edema or consolidation. The heart size and pulmonary vascularity are normal. No adenopathy. Bones appear osteoporotic. No blastic or lytic bone lesions. IMPRESSION: Port-A-Cath tip in superior vena cava. No pneumothorax. No edema or consolidation. No evident adenopathy. Electronically Signed   By: Lowella Grip III M.D.   On: 05/14/2018 07:51   Ct Angio Chest Pe W And/or Wo Contrast  Result Date: 05/14/2018 CLINICAL DATA:  c/o palpitations starting yesterday, worsening this morning. Patient reports recent stroke in which she received TPA on Monday. She states she was told her blood count was low and she may just need a transfusion. EXAM: CT ANGIOGRAPHY CHEST WITH CONTRAST TECHNIQUE: Multidetector CT imaging of the chest was performed using the standard protocol during bolus administration of intravenous contrast. Multiplanar CT image reconstructions and MIPs were obtained to evaluate the vascular anatomy. CONTRAST:  71mL ISOVUE-370 IOPAMIDOL (ISOVUE-370) INJECTION 76% COMPARISON:  Chest x-ray 05/14/2018 FINDINGS: Cardiovascular: Heart size is normal. The RV: LV ratio is normal (0.89). There is no pericardial effusion. There is minimal atherosclerotic calcification of the coronary arteries. Thoracic aorta is normal in appearance. The pulmonary arteries are well opacified by contrast bolus. There are pulmonary emboli within the RIGHT main pulmonary artery extending into the RIGHT LOWER lobe and RIGHT middle lobe branches. LEFT pulmonary arteries are normally opacified. LEFT-sided Port-A-Cath tip to the LOWER superior vena cava. Mediastinum/Nodes: The visualized portion of the thyroid gland  has a normal appearance. The esophagus is normal in appearance. No mediastinal, hilar, or axillary adenopathy. Lungs/Pleura: There is bibasilar atelectasis. Smooth septal wall thickening and diffuse ground-glass opacities are consistent with mild pulmonary edema. Upper Abdomen: No acute abnormality. Musculoskeletal: No chest wall abnormality. No acute or significant osseous findings. Review of the MIP images confirms the above findings. IMPRESSION: The study is positive for pulmonary embolus. There is no evidence for RIGHT heart strain. Bilateral airspace filling and septal thickening consistent with mild edema. Critical Value/emergent results were called by telephone at the time of interpretation on 05/14/2018 at 10:38 am to [facility] , who verbally acknowledged these results. Electronically Signed   By: Nolon Nations M.D.   On: 05/14/2018 10:41    Microbiology: Recent Results (from the past 240 hour(s))  Urine culture     Status: Abnormal   Collection Time: 05/14/18 10:46 AM  Result Value Ref Range  Status   Specimen Description URINE, RANDOM  Final   Special Requests   Final    NONE Performed at [facility], [place] [address], [place], [place] [address]    Culture >=100,000 COLONIES/mL ESCHERICHIA COLI (A)  Final   Report Status 05/16/2018 FINAL  Final   Organism ID, Bacteria ESCHERICHIA COLI (A)  Final      Susceptibility   Escherichia coli - MIC*    AMPICILLIN >=32 RESISTANT Resistant     CEFAZOLIN >=64 RESISTANT Resistant     CEFTRIAXONE <=1 SENSITIVE Sensitive     CIPROFLOXACIN >=4 RESISTANT Resistant     GENTAMICIN <=1 SENSITIVE Sensitive     IMIPENEM <=0.25 SENSITIVE Sensitive     NITROFURANTOIN <=16 SENSITIVE Sensitive     TRIMETH/SULFA >=320 RESISTANT Resistant     AMPICILLIN/SULBACTAM >=32 RESISTANT Resistant     PIP/TAZO >=128 RESISTANT Resistant     Extended ESBL NEGATIVE Sensitive     * >=100,000 COLONIES/mL ESCHERICHIA COLI     Labs: Basic Metabolic  Panel: Recent Labs  Lab 05/18/18 0439 05/19/18 0500 05/20/18 0500 05/21/18 0528 05/22/18 0500  NA 137 134* 136 137 137  K 3.6 3.3* 2.9* 3.3* 3.4*  CL 104 101 103 105 104  CO2 22 25 25 25 26   GLUCOSE 110* 111* 110* 105* 98  BUN 34* 40* 30* 21 16  CREATININE 1.32* 1.39* 1.33* 1.25* 1.23*  CALCIUM 8.2* 8.2* 7.6* 7.8* 7.9*   Liver Function Tests: Recent Labs  Lab 05/18/18 0439 05/19/18 0500 05/20/18 0500  AST 16 26 30   ALT 14 24 41  ALKPHOS 53 67 73  BILITOT 0.6 1.0 0.9  PROT 4.9* 5.0* 5.1*  ALBUMIN 2.9* 3.0* 2.9*   No results for input(s): LIPASE, AMYLASE in the last 168 hours. No results for input(s): AMMONIA in the last 168 hours. CBC: Recent Labs  Lab 05/17/18 0500 05/18/18 0439 05/18/18 1822 05/19/18 0500 05/20/18 0500 05/21/18 0414 05/21/18 1940 05/22/18 0500  WBC 13.6* 16.7* 19.6* 15.5* 12.3* 11.9*  --  9.9  NEUTROABS 8.7* 12.4*  --  12.1* 8.0*  --   --   --   HGB 8.7* 6.6* 9.8* 9.0* 8.0* 7.4* 10.5* 10.0*  HCT 27.7* 20.6* 28.9* 26.7* 24.0* 22.8* 32.1* 31.5*  MCV 103.7* 104.6* 91.5 92.4 94.1 97.9  --  94.0  PLT 38* 32* 25* 29* 32* 42*  --  59*   Cardiac Enzymes: No results for input(s): CKTOTAL, CKMB, CKMBINDEX, TROPONINI in the last 168 hours. BNP: BNP (last 3 results) Recent Labs    05/14/18 0925  BNP 112.6*    ProBNP (last 3 results) No results for input(s): PROBNP in the last 8760 hours.  CBG: No results for input(s): GLUCAP in the last 168 hours.     Signed:  Desiree Hane, MD [facility] 05/22/2018, 11:56 AM

## 2018-05-22 NOTE — Progress Notes (Signed)
Patient ambulated 200 ft in hallway with no oxygen, and continuous Oxygen saturation meter read 96-98%.  Heart rate was 117-124.  Discussed energy conservation methods for home, and gradually increasing activity as tolerated, as well as allowing people to assist at home.  Patient agreeable.

## 2018-05-22 NOTE — Progress Notes (Signed)
Provided patient with 30 day and $10 copay cards for Eliquis.

## 2018-05-25 ENCOUNTER — Telehealth: Payer: Self-pay | Admitting: *Deleted

## 2018-05-25 ENCOUNTER — Other Ambulatory Visit: Payer: Self-pay | Admitting: *Deleted

## 2018-05-25 DIAGNOSIS — C50911 Malignant neoplasm of unspecified site of right female breast: Secondary | ICD-10-CM

## 2018-05-25 NOTE — Telephone Encounter (Signed)
Patient called with concern about whether her labs have gone up since her discharge from the hospital.  Her platelets from 05/22/18 were 59.  Patient pleased about this but would like to come in to have her labwork checked for peace of mind.  Ok with Laverna Peace NP

## 2018-05-26 ENCOUNTER — Inpatient Hospital Stay: Payer: 59 | Attending: Hematology & Oncology

## 2018-05-26 ENCOUNTER — Other Ambulatory Visit: Payer: Self-pay

## 2018-05-26 ENCOUNTER — Inpatient Hospital Stay: Payer: 59

## 2018-05-26 ENCOUNTER — Other Ambulatory Visit: Payer: Self-pay | Admitting: Family

## 2018-05-26 VITALS — BP 111/68 | HR 99 | Temp 98.2°F | Resp 18

## 2018-05-26 DIAGNOSIS — Z86711 Personal history of pulmonary embolism: Secondary | ICD-10-CM | POA: Insufficient documentation

## 2018-05-26 DIAGNOSIS — Z86718 Personal history of other venous thrombosis and embolism: Secondary | ICD-10-CM | POA: Insufficient documentation

## 2018-05-26 DIAGNOSIS — Z7901 Long term (current) use of anticoagulants: Secondary | ICD-10-CM | POA: Insufficient documentation

## 2018-05-26 DIAGNOSIS — C9001 Multiple myeloma in remission: Secondary | ICD-10-CM | POA: Diagnosis not present

## 2018-05-26 DIAGNOSIS — Z9484 Stem cells transplant status: Secondary | ICD-10-CM | POA: Diagnosis not present

## 2018-05-26 DIAGNOSIS — C50411 Malignant neoplasm of upper-outer quadrant of right female breast: Secondary | ICD-10-CM | POA: Diagnosis present

## 2018-05-26 DIAGNOSIS — Z79899 Other long term (current) drug therapy: Secondary | ICD-10-CM | POA: Diagnosis not present

## 2018-05-26 DIAGNOSIS — Z9221 Personal history of antineoplastic chemotherapy: Secondary | ICD-10-CM | POA: Diagnosis not present

## 2018-05-26 DIAGNOSIS — C50911 Malignant neoplasm of unspecified site of right female breast: Secondary | ICD-10-CM

## 2018-05-26 DIAGNOSIS — Z171 Estrogen receptor negative status [ER-]: Secondary | ICD-10-CM | POA: Diagnosis not present

## 2018-05-26 LAB — CBC WITH DIFFERENTIAL (CANCER CENTER ONLY)
BASOS ABS: 0 10*3/uL (ref 0.0–0.1)
BASOS PCT: 0 %
EOS ABS: 0 10*3/uL (ref 0.0–0.5)
EOS PCT: 0 %
HCT: 34.5 % — ABNORMAL LOW (ref 34.8–46.6)
HEMOGLOBIN: 11.4 g/dL — AB (ref 11.6–15.9)
LYMPHS ABS: 2.7 10*3/uL (ref 0.9–3.3)
Lymphocytes Relative: 28 %
MCH: 31.9 pg (ref 26.0–34.0)
MCHC: 33 g/dL (ref 32.0–36.0)
MCV: 96.6 fL (ref 81.0–101.0)
Monocytes Absolute: 0.8 10*3/uL (ref 0.1–0.9)
Monocytes Relative: 8 %
NEUTROS PCT: 64 %
Neutro Abs: 6.2 10*3/uL (ref 1.5–6.5)
PLATELETS: 149 10*3/uL (ref 145–400)
RBC: 3.57 MIL/uL — AB (ref 3.70–5.32)
RDW: 21.3 % — ABNORMAL HIGH (ref 11.1–15.7)
WBC: 9.6 10*3/uL (ref 3.9–10.0)

## 2018-05-26 LAB — COMPREHENSIVE METABOLIC PANEL
ALT: 42 U/L (ref 10–47)
ANION GAP: 16 — AB (ref 5–15)
AST: 27 U/L (ref 11–38)
Albumin: 3.5 g/dL (ref 3.5–5.0)
Alkaline Phosphatase: 87 U/L — ABNORMAL HIGH (ref 26–84)
BUN: 11 mg/dL (ref 7–22)
CALCIUM: 7.8 mg/dL — AB (ref 8.0–10.3)
CO2: 25 mmol/L (ref 18–33)
CREATININE: 1 mg/dL (ref 0.60–1.20)
Chloride: 101 mmol/L (ref 98–108)
Glucose, Bld: 99 mg/dL (ref 73–118)
Potassium: 3.3 mmol/L (ref 3.3–4.7)
SODIUM: 142 mmol/L (ref 128–145)
Total Bilirubin: 0.9 mg/dL (ref 0.2–1.6)
Total Protein: 5.9 g/dL — ABNORMAL LOW (ref 6.4–8.1)

## 2018-05-26 MED ORDER — ANTICOAGULANT SODIUM CITRATE 4% (200MG/5ML) IV SOLN
5.0000 mL | Freq: Once | Status: AC
  Administered 2018-05-26: 5 mL via INTRAVENOUS
  Filled 2018-05-26: qty 5

## 2018-06-01 ENCOUNTER — Other Ambulatory Visit: Payer: 59

## 2018-06-01 ENCOUNTER — Ambulatory Visit: Payer: 59

## 2018-06-01 ENCOUNTER — Other Ambulatory Visit: Payer: Self-pay | Admitting: Hematology & Oncology

## 2018-06-03 ENCOUNTER — Ambulatory Visit: Payer: 59

## 2018-06-05 ENCOUNTER — Ambulatory Visit
Admission: RE | Admit: 2018-06-05 | Discharge: 2018-06-05 | Disposition: A | Discharge status: Home / Self Care | Payer: 59 | Source: Ambulatory Visit | Attending: Hematology & Oncology | Admitting: Hematology & Oncology

## 2018-06-05 DIAGNOSIS — C50911 Malignant neoplasm of unspecified site of right female breast: Secondary | ICD-10-CM

## 2018-06-05 MED ORDER — GADOBENATE DIMEGLUMINE 529 MG/ML IV SOLN
18.0000 mL | Freq: Once | INTRAVENOUS | Status: AC | PRN
  Administered 2018-06-05: 18 mL via INTRAVENOUS

## 2018-06-07 ENCOUNTER — Other Ambulatory Visit: Payer: Self-pay | Admitting: Hematology & Oncology

## 2018-06-07 DIAGNOSIS — C50911 Malignant neoplasm of unspecified site of right female breast: Secondary | ICD-10-CM

## 2018-06-08 ENCOUNTER — Ambulatory Visit
Admission: RE | Admit: 2018-06-08 | Discharge: 2018-06-08 | Disposition: A | Discharge status: Home / Self Care | Payer: 59 | Source: Ambulatory Visit | Attending: Hematology & Oncology | Admitting: Hematology & Oncology

## 2018-06-08 DIAGNOSIS — C50911 Malignant neoplasm of unspecified site of right female breast: Secondary | ICD-10-CM

## 2018-06-16 ENCOUNTER — Inpatient Hospital Stay: Payer: 59

## 2018-06-16 ENCOUNTER — Other Ambulatory Visit: Payer: Self-pay

## 2018-06-16 ENCOUNTER — Inpatient Hospital Stay (HOSPITAL_BASED_OUTPATIENT_CLINIC_OR_DEPARTMENT_OTHER): Payer: 59 | Admitting: Hematology & Oncology

## 2018-06-16 VITALS — BP 97/67 | HR 71 | Temp 98.2°F | Resp 16

## 2018-06-16 DIAGNOSIS — D649 Anemia, unspecified: Secondary | ICD-10-CM

## 2018-06-16 DIAGNOSIS — Z79899 Other long term (current) drug therapy: Secondary | ICD-10-CM

## 2018-06-16 DIAGNOSIS — Z9484 Stem cells transplant status: Secondary | ICD-10-CM

## 2018-06-16 DIAGNOSIS — Z171 Estrogen receptor negative status [ER-]: Secondary | ICD-10-CM | POA: Diagnosis not present

## 2018-06-16 DIAGNOSIS — C9001 Multiple myeloma in remission: Secondary | ICD-10-CM | POA: Diagnosis not present

## 2018-06-16 DIAGNOSIS — C50411 Malignant neoplasm of upper-outer quadrant of right female breast: Secondary | ICD-10-CM

## 2018-06-16 DIAGNOSIS — Z86718 Personal history of other venous thrombosis and embolism: Secondary | ICD-10-CM

## 2018-06-16 DIAGNOSIS — Z86711 Personal history of pulmonary embolism: Secondary | ICD-10-CM

## 2018-06-16 DIAGNOSIS — C50911 Malignant neoplasm of unspecified site of right female breast: Secondary | ICD-10-CM

## 2018-06-16 DIAGNOSIS — Z7901 Long term (current) use of anticoagulants: Secondary | ICD-10-CM

## 2018-06-16 DIAGNOSIS — Z9221 Personal history of antineoplastic chemotherapy: Secondary | ICD-10-CM | POA: Diagnosis not present

## 2018-06-16 LAB — CMP (CANCER CENTER ONLY)
ALBUMIN: 3.7 g/dL (ref 3.5–5.0)
ALK PHOS: 61 U/L (ref 26–84)
ALT: 17 U/L (ref 10–47)
ANION GAP: 8 (ref 5–15)
AST: 21 U/L (ref 11–38)
BUN: 14 mg/dL (ref 7–22)
CALCIUM: 8.6 mg/dL (ref 8.0–10.3)
CHLORIDE: 109 mmol/L — AB (ref 98–108)
CO2: 26 mmol/L (ref 18–33)
CREATININE: 1.4 mg/dL — AB (ref 0.60–1.20)
Glucose, Bld: 103 mg/dL (ref 73–118)
Potassium: 3.2 mmol/L — ABNORMAL LOW (ref 3.3–4.7)
Sodium: 143 mmol/L (ref 128–145)
Total Bilirubin: 0.8 mg/dL (ref 0.2–1.6)
Total Protein: 6.3 g/dL — ABNORMAL LOW (ref 6.4–8.1)

## 2018-06-16 LAB — CBC WITH DIFFERENTIAL (CANCER CENTER ONLY)
BASOS PCT: 0 %
Basophils Absolute: 0 10*3/uL (ref 0.0–0.1)
Eosinophils Absolute: 0.2 10*3/uL (ref 0.0–0.5)
Eosinophils Relative: 2 %
HEMATOCRIT: 36.9 % (ref 34.8–46.6)
HEMOGLOBIN: 12.3 g/dL (ref 11.6–15.9)
LYMPHS ABS: 3 10*3/uL (ref 0.9–3.3)
Lymphocytes Relative: 43 %
MCH: 33.3 pg (ref 26.0–34.0)
MCHC: 33.3 g/dL (ref 32.0–36.0)
MCV: 100 fL (ref 81.0–101.0)
MONOS PCT: 8 %
Monocytes Absolute: 0.6 10*3/uL (ref 0.1–0.9)
NEUTROS ABS: 3.2 10*3/uL (ref 1.5–6.5)
NEUTROS PCT: 47 %
Platelet Count: 191 10*3/uL (ref 145–400)
RBC: 3.69 MIL/uL — ABNORMAL LOW (ref 3.70–5.32)
RDW: 21.1 % — ABNORMAL HIGH (ref 11.1–15.7)
WBC Count: 7 10*3/uL (ref 3.9–10.0)

## 2018-06-16 MED ORDER — SODIUM CHLORIDE 0.9 % IJ SOLN
10.0000 mL | Freq: Once | INTRAMUSCULAR | Status: AC
  Administered 2018-06-16: 10 mL
  Filled 2018-06-16: qty 10

## 2018-06-16 MED ORDER — ANTICOAGULANT SODIUM CITRATE 4% (200MG/5ML) IV SOLN
5.0000 mL | Freq: Once | Status: AC
  Administered 2018-06-16: 5 mL via INTRAVENOUS
  Filled 2018-06-16: qty 5

## 2018-06-16 MED ORDER — FOLIC ACID 1 MG PO TABS: 2.0000 mg | ORAL_TABLET | Freq: Every day | ORAL | 12 refills | Status: DC

## 2018-06-16 NOTE — Patient Instructions (Signed)
Implanted Port Home Guide An implanted port is a type of central line that is placed under the skin. Central lines are used to provide IV access when treatment or nutrition needs to be given through a person's veins. Implanted ports are used for long-term IV access. An implanted port may be placed because:  You need IV medicine that would be irritating to the small veins in your hands or arms.  You need long-term IV medicines, such as antibiotics.  You need IV nutrition for a long period.  You need frequent blood draws for lab tests.  You need dialysis.  Implanted ports are usually placed in the chest area, but they can also be placed in the upper arm, the abdomen, or the leg. An implanted port has two main parts:  Reservoir. The reservoir is round and will appear as a small, raised area under your skin. The reservoir is the part where a needle is inserted to give medicines or draw blood.  Catheter. The catheter is a thin, flexible tube that extends from the reservoir. The catheter is placed into a large vein. Medicine that is inserted into the reservoir goes into the catheter and then into the vein.  How will I care for my incision site? Do not get the incision site wet. Bathe or shower as directed by your health care provider. How is my port accessed? Special steps must be taken to access the port:  Before the port is accessed, a numbing cream can be placed on the skin. This helps numb the skin over the port site.  Your health care provider uses a sterile technique to access the port. ? Your health care provider must put on a mask and sterile gloves. ? The skin over your port is cleaned carefully with an antiseptic and allowed to dry. ? The port is gently pinched between sterile gloves, and a needle is inserted into the port.  Only "non-coring" port needles should be used to access the port. Once the port is accessed, a blood return should be checked. This helps ensure that the port  is in the vein and is not clogged.  If your port needs to remain accessed for a constant infusion, a clear (transparent) bandage will be placed over the needle site. The bandage and needle will need to be changed every week, or as directed by your health care provider.  Keep the bandage covering the needle clean and dry. Do not get it wet. Follow your health care provider's instructions on how to take a shower or bath while the port is accessed.  If your port does not need to stay accessed, no bandage is needed over the port.  What is flushing? Flushing helps keep the port from getting clogged. Follow your health care provider's instructions on how and when to flush the port. Ports are usually flushed with saline solution or a medicine called heparin. The need for flushing will depend on how the port is used.  If the port is used for intermittent medicines or blood draws, the port will need to be flushed: ? After medicines have been given. ? After blood has been drawn. ? As part of routine maintenance.  If a constant infusion is running, the port may not need to be flushed.  How long will my port stay implanted? The port can stay in for as long as your health care provider thinks it is needed. When it is time for the port to come out, surgery will be   done to remove it. The procedure is similar to the one performed when the port was put in. When should I seek immediate medical care? When you have an implanted port, you should seek immediate medical care if:  You notice a bad smell coming from the incision site.  You have swelling, redness, or drainage at the incision site.  You have more swelling or pain at the port site or the surrounding area.  You have a fever that is not controlled with medicine.  This information is not intended to replace advice given to you by your health care provider. Make sure you discuss any questions you have with your health care provider. Document  Released: 11/10/2005 Document Revised: 04/17/2016 Document Reviewed: 07/18/2013 Elsevier Interactive Patient Education  2017 [employer]  

## 2018-06-16 NOTE — Progress Notes (Signed)
Hematology and Oncology Follow Up Visit  Marcia [name] [id number] [birth year] 67 y.o. 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 is here today for follow-up.  Surprisingly enough, I actually saw her in the hospital because she had developed a pulmonary embolism and lower extremity DVTs.  She was admitted on May 14, 2018.  She had a CT angiogram done.  This showed by lateral pulmonary emboli.  She had a lower extremity Doppler which showed thrombus in both lower extremities.  She was started on heparin.  She was then transitioned over to Eliquis.  Other Eliquis would be a good idea for her given her history of renal insufficiency.  She really looks great.  She feels better.  She had a MRI of her breast post chemotherapy.  She had this on July 13.  Thankfully, looks like she had a very good response to treatment.  She had a residual 3 x 2 x 1 cm mass in the upper outer quadrant of the right breast.  There was no obvious adenopathy from what I could tell by the report.  I spoke with Dr. Fanny Skates.  He will see her and consider her for a lumpectomy.  She will definitely need postop radiation therapy.  In addition, if there is residual disease, she will need postop Kadcyla as per the recent randomized clinical trial that showed the benefit of Kadcyla versus Herceptin in the postop setting.  I think she will probably benefit from having a temporary filter placed prior to her surgery.  I think that a temporary IVC filter will help provide a little more certainty that any lower extremity thrombus will  not embolize to the lung.  Her appetite is doing okay.  She is becoming more active.  She has not had any fever.  Her renal function has been doing fairly well.  Overall, I would say that her performance status is ECOG 1.  Medications:  Allergies as of 06/16/2018      Reactions   Bactrim [sulfamethoxazole-trimethoprim] Other (See Comments)   ? Renal failure.   Heparin    HIT   Zofran [ondansetron] Shortness Of Breath   "felt like throat was closing"   Clarithromycin Diarrhea, Nausea And Vomiting   Macrodantin [nitrofurantoin] Rash      Medication List        Accurate as of 06/16/18  1:40 PM. Always use your most recent med list.          apixaban 5 MG Tabs tablet Commonly known as:  ELIQUIS Take 1 tablet (5 mg total) by mouth 2 (two) times daily.   doxylamine (Sleep) 25 MG tablet Commonly known as:  UNISOM Take 25 mg by mouth at bedtime as needed for sleep.   feeding supplement (ENSURE ENLIVE) Liqd Take 237 mLs by mouth 3 (three) times daily between meals.   folic acid 1 MG tablet Commonly known as:  FOLVITE Take 2 tablets (2 mg total) by mouth daily.   lidocaine-prilocaine cream Commonly known as:  EMLA Apply to affected area once   LORazepam 0.5 MG tablet Commonly known as:  ATIVAN TAKE 1 TABLET BY MOUTH EVERY 6 HOURS AS  NEEDED FOR NAUSEA/VOMITING   magic mouthwash Soln Take 5 mLs by mouth 4 (four) times daily as needed for mouth pain. Components benadryl  525 mg, hydrocortisone 60 mg and nystatin 0.6 mg. 240 ml   magic mouthwash Soln Take 5 mLs by mouth 4 (four) times daily. Magic Mouth Wash [place]   prochlorperazine 10 MG tablet Commonly known as:  COMPAZINE TAKE 1 TABLET (10 MG TOTAL) BY MOUTH EVERY 6 (SIX) HOURS AS NEEDED (NAUSEA OR VOMITING).       Allergies:  Allergies  Allergen Reactions  . Bactrim [Sulfamethoxazole-Trimethoprim] Other (See Comments)    ? Renal failure.  . Heparin     HIT  . Zofran [Ondansetron] Shortness Of Breath     "felt like throat was closing"  . Clarithromycin Diarrhea and Nausea And Vomiting  . Macrodantin [Nitrofurantoin] Rash    Past Medical History, Surgical history, Social history, and Family History were reviewed and updated.  Review of Systems: Review of Systems  Constitutional: Negative.   HENT: Negative.   Eyes: Negative.   Respiratory: Negative.   Cardiovascular: Negative.   Gastrointestinal: Negative.   Genitourinary: Negative.  Negative for dysuria.  Musculoskeletal: Negative.   Skin: Negative.   Neurological: Negative.   Endo/Heme/Allergies: Negative.   Psychiatric/Behavioral: Negative.       Physical Exam:  oral temperature is 98.2 F (36.8 C). Her blood pressure is 97/67 and her pulse is 71. Her respiration is 16 and oxygen saturation is 99%.   Wt Readings from Last 3 Encounters:  05/14/18 194 lb 7.1 oz (88.2 kg)  05/05/18 198 lb 1.9 oz (89.9 kg)  04/14/18 207 lb (93.9 kg)    Physical Exam  Constitutional: She is oriented to person, place, and time.  HENT:  Head: Normocephalic and atraumatic.  Mouth/Throat: Oropharynx is clear and moist.  Eyes: Pupils are equal, round, and reactive to light. EOM are normal.  Neck: Normal range of motion.  Cardiovascular: Normal rate, regular rhythm and normal heart sounds.  Pulmonary/Chest: Effort normal and breath sounds normal.  Abdominal: Soft. Bowel sounds are normal.  Musculoskeletal: Normal range of motion. She exhibits no edema, tenderness or deformity.  Lymphadenopathy:    She has no cervical adenopathy.  Neurological: She is alert and oriented to person, place, and time.  Skin: Skin is warm and dry. No rash noted. No erythema.  Psychiatric: She has a normal mood and affect. Her behavior is normal. Judgment and thought content normal.  Vitals reviewed.    Lab Results  Component Value Date   WBC 7.0 06/16/2018   HGB 12.3 06/16/2018   HCT 36.9 06/16/2018   MCV 100.0 06/16/2018   PLT 191 06/16/2018   Lab  Results  Component Value Date   FERRITIN 820 (H) 05/17/2018   IRON 272 (H) 05/17/2018   TIBC 295 05/17/2018   UIBC 23 05/17/2018   IRONPCTSAT 92 (H) 05/17/2018   Lab Results  Component Value Date   RETICCTPCT 1.3 03/27/2011   RBC 3.69 (L) 06/16/2018   RETICCTABS 56.4 03/27/2011   Lab Results  Component Value Date   KPAFRELGTCHN 23.1 (H) 02/16/2018   LAMBDASER 15.8 02/16/2018   KAPLAMBRATIO 1.46 02/16/2018   Lab Results  Component Value Date   IGGSERUM 924 02/16/2018   IGA 222 02/16/2018   IGMSERUM 126 02/16/2018   Lab Results  Component Value Date   TOTALPROTELP 5.2 (L) 03/30/2018   ALBUMINELP 3.0 03/30/2018   A1GS 0.2 03/30/2018   A2GS 0.7 03/30/2018   BETS 0.8 03/30/2018  BETA2SER 0.4 07/02/2015   GAMS 0.6 03/30/2018   MSPIKE Not Observed 03/30/2018   SPEI Comment 03/30/2018     Chemistry      Component Value Date/Time   NA 143 06/16/2018 0820   NA 141 07/06/2017 0816   NA 141 01/05/2017 0956   K 3.2 (L) 06/16/2018 0820   K 3.6 07/06/2017 0816   K 4.3 01/05/2017 0956   CL 109 (H) 06/16/2018 0820   CL 101 07/06/2017 0816   CO2 26 06/16/2018 0820   CO2 28 07/06/2017 0816   CO2 23 01/05/2017 0956   BUN 14 06/16/2018 0820   BUN 28 (H) 07/06/2017 0816   BUN 22.4 01/05/2017 0956   CREATININE 1.40 (H) 06/16/2018 0820   CREATININE 1.6 (H) 07/06/2017 0816   CREATININE 1.4 (H) 01/05/2017 0956      Component Value Date/Time   CALCIUM 8.6 06/16/2018 0820   CALCIUM 9.1 07/06/2017 0816   CALCIUM 9.3 01/05/2017 0956   ALKPHOS 61 06/16/2018 0820   ALKPHOS 58 07/06/2017 0816   ALKPHOS 52 01/05/2017 0956   AST 21 06/16/2018 0820   AST 14 01/05/2017 0956   ALT 17 06/16/2018 0820   ALT 58 (H) 07/06/2017 0816   ALT 13 01/05/2017 0956   BILITOT 0.8 06/16/2018 0820   BILITOT 0.56 01/05/2017 0956      Impression and Plan: [name] is a very pleasant 67 yo caucasian female with past history of kappa light chain myeloma with stem cell transplant. She has now  been diagnosed with locally advanced infiltrating ductal carcinoma of the right breast, ER(-)/PR(-)/HER-2(+), Ki67 of 75%.   Now she is completed neoadjuvant chemotherapy, she will have surgery.  The fact that she had the thromboembolic event is a little bit of a tenuous problem.  Again, we will see about having an IVC filter placed.  She will probably need another CT angiogram and lower extremity Dopplers in about 2 or 3 months.  I probably would keep her on the Eliquis for 2 years.  I think she will need Eliquis.  I suspect that the thromboembolic event was more likely from her underlying malignancy.  I would like to see her back in 6 weeks.  By then, she would have recovered from her surgery.  We can see what the surgical pathology looks like.   [name], MD 7/24/20191:40 PM

## 2018-06-22 ENCOUNTER — Other Ambulatory Visit: Payer: Self-pay | Admitting: General Surgery

## 2018-06-22 DIAGNOSIS — Z171 Estrogen receptor negative status [ER-]: Principal | ICD-10-CM

## 2018-06-22 DIAGNOSIS — C50411 Malignant neoplasm of upper-outer quadrant of right female breast: Secondary | ICD-10-CM

## 2018-06-23 ENCOUNTER — Other Ambulatory Visit: Payer: Self-pay | Admitting: General Surgery

## 2018-06-23 DIAGNOSIS — Z171 Estrogen receptor negative status [ER-]: Principal | ICD-10-CM

## 2018-06-23 DIAGNOSIS — C50411 Malignant neoplasm of upper-outer quadrant of right female breast: Secondary | ICD-10-CM

## 2018-06-28 ENCOUNTER — Other Ambulatory Visit: Payer: Self-pay | Admitting: Radiology

## 2018-06-29 ENCOUNTER — Encounter (HOSPITAL_COMMUNITY): Payer: Self-pay

## 2018-06-29 ENCOUNTER — Ambulatory Visit (HOSPITAL_COMMUNITY)
Admission: RE | Admit: 2018-06-29 | Discharge: 2018-06-29 | Disposition: A | Discharge status: Home / Self Care | Payer: 59 | Source: Ambulatory Visit | Attending: Hematology & Oncology | Admitting: Hematology & Oncology

## 2018-06-29 DIAGNOSIS — Z7901 Long term (current) use of anticoagulants: Secondary | ICD-10-CM | POA: Insufficient documentation

## 2018-06-29 DIAGNOSIS — C9001 Multiple myeloma in remission: Secondary | ICD-10-CM

## 2018-06-29 DIAGNOSIS — Z91040 Latex allergy status: Secondary | ICD-10-CM | POA: Diagnosis not present

## 2018-06-29 DIAGNOSIS — C50912 Malignant neoplasm of unspecified site of left female breast: Secondary | ICD-10-CM | POA: Diagnosis not present

## 2018-06-29 DIAGNOSIS — Z86718 Personal history of other venous thrombosis and embolism: Secondary | ICD-10-CM | POA: Insufficient documentation

## 2018-06-29 DIAGNOSIS — Z408 Encounter for other prophylactic surgery: Secondary | ICD-10-CM | POA: Insufficient documentation

## 2018-06-29 DIAGNOSIS — I10 Essential (primary) hypertension: Secondary | ICD-10-CM | POA: Insufficient documentation

## 2018-06-29 HISTORY — PX: IR IVC FILTER PLMT / S&I /IMG GUID/MOD SED: IMG701

## 2018-06-29 LAB — BASIC METABOLIC PANEL
ANION GAP: 10 (ref 5–15)
BUN: 22 mg/dL (ref 8–23)
CALCIUM: 9 mg/dL (ref 8.9–10.3)
CO2: 24 mmol/L (ref 22–32)
Chloride: 109 mmol/L (ref 98–111)
Creatinine, Ser: 1.36 mg/dL — ABNORMAL HIGH (ref 0.44–1.00)
GFR, EST AFRICAN AMERICAN: 45 mL/min — AB (ref >60)
GFR, EST NON AFRICAN AMERICAN: 39 mL/min — AB (ref >60)
Glucose, Bld: 102 mg/dL — ABNORMAL HIGH (ref 70–99)
POTASSIUM: 3.5 mmol/L (ref 3.5–5.1)
Sodium: 143 mmol/L (ref 135–145)

## 2018-06-29 LAB — CBC WITH DIFFERENTIAL/PLATELET
BASOS ABS: 0 10*3/uL (ref 0.0–0.1)
BASOS PCT: 1 %
Eosinophils Absolute: 0.2 10*3/uL (ref 0.0–0.7)
Eosinophils Relative: 3 %
HEMATOCRIT: 38.2 % (ref 36.0–46.0)
Hemoglobin: 12.9 g/dL (ref 12.0–15.0)
LYMPHS PCT: 47 %
Lymphs Abs: 3 10*3/uL (ref 0.7–4.0)
MCH: 34 pg (ref 26.0–34.0)
MCHC: 33.8 g/dL (ref 30.0–36.0)
MCV: 100.8 fL — AB (ref 78.0–100.0)
Monocytes Absolute: 0.5 10*3/uL (ref 0.1–1.0)
Monocytes Relative: 8 %
NEUTROS ABS: 2.6 10*3/uL (ref 1.7–7.7)
NEUTROS PCT: 41 %
Platelets: 179 10*3/uL (ref 150–400)
RBC: 3.79 MIL/uL — AB (ref 3.87–5.11)
RDW: 18.8 % — AB (ref 11.5–15.5)
WBC: 6.4 10*3/uL (ref 4.0–10.5)

## 2018-06-29 LAB — PROTIME-INR
INR: 0.9
Prothrombin Time: 12.1 seconds (ref 11.4–15.2)

## 2018-06-29 MED ORDER — LIDOCAINE HCL 1 % IJ SOLN
INTRAMUSCULAR | Status: AC
  Filled 2018-06-29: qty 20

## 2018-06-29 MED ORDER — IOPAMIDOL (ISOVUE-300) INJECTION 61%
INTRAVENOUS | Status: AC
  Filled 2018-06-29: qty 100

## 2018-06-29 MED ORDER — FENTANYL CITRATE (PF) 100 MCG/2ML IJ SOLN
INTRAMUSCULAR | Status: AC
  Filled 2018-06-29: qty 4

## 2018-06-29 MED ORDER — FLUMAZENIL 0.5 MG/5ML IV SOLN
INTRAVENOUS | Status: AC
  Filled 2018-06-29: qty 5

## 2018-06-29 MED ORDER — MIDAZOLAM HCL 2 MG/2ML IJ SOLN
INTRAMUSCULAR | Status: AC
  Filled 2018-06-29: qty 4

## 2018-06-29 MED ORDER — FENTANYL CITRATE (PF) 100 MCG/2ML IJ SOLN
INTRAMUSCULAR | Status: AC | PRN
  Administered 2018-06-29 (×2): 50 ug via INTRAVENOUS

## 2018-06-29 MED ORDER — IOPAMIDOL (ISOVUE-300) INJECTION 61%
INTRAVENOUS | Status: AC
  Filled 2018-06-29: qty 50

## 2018-06-29 MED ORDER — NALOXONE HCL 0.4 MG/ML IJ SOLN
INTRAMUSCULAR | Status: AC
  Filled 2018-06-29: qty 1

## 2018-06-29 MED ORDER — SODIUM CHLORIDE 0.9 % IV SOLN
INTRAVENOUS | Status: DC
  Administered 2018-06-29: 09:00:00 via INTRAVENOUS

## 2018-06-29 MED ORDER — ANTICOAGULANT SODIUM CITRATE 4% (200MG/5ML) IV SOLN
5.0000 mL | Freq: Once | Status: AC
  Administered 2018-06-29: 5 mL via INTRAVENOUS
  Filled 2018-06-29: qty 5

## 2018-06-29 MED ORDER — MIDAZOLAM HCL 2 MG/2ML IJ SOLN
INTRAMUSCULAR | Status: AC | PRN
  Administered 2018-06-29 (×2): 1 mg via INTRAVENOUS

## 2018-06-29 NOTE — Discharge Instructions (Signed)
Moderate Conscious Sedation, Adult, Care After These instructions provide you with information about caring for yourself after your procedure. Your health care provider may also give you more specific instructions. Your treatment has been planned according to current medical practices, but problems sometimes occur. Call your health care provider if you have any problems or questions after your procedure. What can I expect after the procedure? After your procedure, it is common:  To feel sleepy for several hours.  To feel clumsy and have poor balance for several hours.  To have poor judgment for several hours.  To vomit if you eat too soon.  Follow these instructions at home: For at least 24 hours after the procedure:   Do not: ? Participate in activities where you could fall or become injured. ? Drive. ? Use heavy machinery. ? Drink alcohol. ? Take sleeping pills or medicines that cause drowsiness. ? Make important decisions or sign legal documents. ? Take care of children on your own.  Rest. Eating and drinking  Follow the diet recommended by your health care provider.  If you vomit: ? Drink water, juice, or soup when you can drink without vomiting. ? Make sure you have little or no nausea before eating solid foods. General instructions  Have a responsible adult stay with you until you are awake and alert.  Take over-the-counter and prescription medicines only as told by your health care provider.  If you smoke, do not smoke without supervision.  Keep all follow-up visits as told by your health care provider. This is important. Contact a health care provider if:  You keep feeling nauseous or you keep vomiting.  You feel light-headed.  You develop a rash.  You have a fever. Get help right away if:  You have trouble breathing. This information is not intended to replace advice given to you by your health care provider. Make sure you discuss any questions you have  with your health care provider. Document Released: 08/31/2013 Document Revised: 04/14/2016 Document Reviewed: 03/01/2016 Elsevier Interactive Patient Education  2018 [employer].   Inferior Vena Cava Filter Insertion, Care After This sheet gives you information about how to care for yourself after your procedure. Your health care provider may also give you more specific instructions. If you have problems or questions, contact your health care provider. What can I expect after the procedure? After your procedure, it is common to have:  Mild pain in the area where the filter was inserted.  Mild bruising in the area where the filter was inserted.  Follow these instructions at home: Insertion site care  Follow instructions from your health care provider about how to take care of the site where a catheter was inserted at your neck or groin (insertion site). Make sure you: ? Wash your hands with soap and water before you change your bandage (dressing). If soap and water are not available, use hand sanitizer. ? Change your dressing as told by your health care provider.  You may remove your dressing tomorrow.  Check your insertion site every day for signs of infection. Check for: ? More redness, swelling, or pain. ? More fluid or blood. ? Warmth. ? Pus or a bad smell.  Keep the insertion site clean and dry.  Do not shower, bathe, use a hot tub, or let the dressing get wet until your health care provider approves.  You may shower tomorrow. General instructions  Take over-the-counter and prescription medicines only as told by your health care provider.  Avoid heavy lifting or hard activities for 48 hours after the procedure or as told by your health care provider.  Do not drive for 24 hours if you were given a a medicine to help you relax (sedative).  Do not drive or use heavy machinery while taking prescription pain medicine.  Do not go back to school or work until your health care  provider approves.  Keep all follow-up visits as told by your health care provider. This is important. Contact a health care provider if:  You have more redness, swelling, or pain around your insertion site.  You have more fluid or blood coming from your insertion site.  Your insertion site feels warm to the touch.  You have pus or a bad smell coming from your insertion site.  You have a fever.  You are dizzy.  You have nausea and vomiting.  You develop a rash. Get help right away if:  You develop chest pain, a cough, or difficulty breathing.  You develop shortness of breath, feel faint, or pass out.  You cough up blood.  You have severe pain in your abdomen.  You develop swelling and discoloration or pain in your legs.  Your legs become pale and cold or blue.  You develop weakness, difficulty moving your arms or legs, or balance problems.  You develop problems with speech or vision. These symptoms may represent a serious problem that is an emergency. Do not wait to see if the symptoms will go away. Get medical help right away. Call your local emergency services (911 in the [place] Do not drive yourself to the hospital. Summary  After your insertion procedure, it is common to have mild pain and bruising.  Do not shower, bathe, use a hot tub, or let the dressing get wet until your health care provider approves.  Every day, check for signs of infection where a catheter was inserted at your neck or groin (insertion site). This information is not intended to replace advice given to you by your health care provider. Make sure you discuss any questions you have with your health care provider. Document Released: 08/31/2013 Document Revised: 10/01/2016 Document Reviewed: 10/01/2016 Elsevier Interactive Patient Education  2017 [employer].

## 2018-06-29 NOTE — Sedation Documentation (Signed)
Patient is resting comfortably with eyes closed in NAD. 

## 2018-06-29 NOTE — Procedures (Signed)
Pre procedural Dx: DVT/PE Post Procedural Dx: Same  Successful placement of an infrarenal IVC filter via the R IJ vein.  EBL: Trace  Complications: None immediate  Ronny Bacon, MD Pager #: [phone]

## 2018-06-29 NOTE — H&P (Signed)
Chief Complaint: Patient was seen in consultation today for IVC filter placement at the request of Ennever,Peter R  Referring Physician(s): Ennever,Peter R  Supervising Physician: Sandi Mariscal  Patient Status: [facility] - Out-pt  History of Present Illness: Marcia [name] is a 67 y.o. female with hx of breast cancer. 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 She has upcoming surgical procedure on her right breast and will be coming off her anticoagulation for an extended time. She is referred for placement of temporary IVC filter. PMHx, meds, labs, imaging, allergies reviewed. Feels well, no recent fevers, chills, illness. Has been NPO today as directed. Family at bedside.   Past Medical History:  Diagnosis Date  . Anemia   . Cancer [facility])    Multiple Myeloma  . Counseling regarding goals of care 02/02/2018  . Hypertension   . 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 ABDOMINAL HYSTERECTOMY     partial  . APPENDECTOMY    . BREAST SURGERY     biopsy  . PORTACATH PLACEMENT Left 02/12/2018   Procedure: INSERTION PORT-A-CATH;  Surgeon: Fanny Skates, MD;  Location: [facility];  Service: General;  Laterality: Left;  . TUBAL LIGATION      Allergies: Bactrim [sulfamethoxazole-trimethoprim]; Heparin; Zofran [ondansetron]; Clarithromycin; Latex; and Macrodantin [nitrofurantoin]  Medications: Prior to Admission medications   Medication Sig Start Date End Date Taking? Authorizing Provider  acetaminophen (TYLENOL) 325 MG tablet Take 650 mg by mouth daily as needed for moderate pain or headache.   Yes [provider]  doxylamine, Sleep, (UNISOM) 25 MG tablet Take 25 mg by mouth at bedtime as needed for sleep.   Yes [provider]  feeding supplement, ENSURE ENLIVE, (ENSURE  ENLIVE) LIQD Take 237 mLs by mouth 3 (three) times daily between meals. Patient taking differently: Take 237 mLs by mouth 2 (two) times daily.  05/22/18  Yes Oretha Milch D, MD  lidocaine-prilocaine (EMLA) cream Apply to affected area once Patient taking differently: Apply 1 application topically daily as needed (port access).  02/16/18  Yes Ennever, Rudell Cobb, MD  LORazepam (ATIVAN) 0.5 MG tablet TAKE 1 TABLET BY MOUTH EVERY 6 HOURS AS NEEDED FOR NAUSEA/VOMITING 05/07/18  Yes [place], Holli Humbles, NP  apixaban (ELIQUIS) 5 MG TABS tablet Take 1 tablet (5 mg total) by mouth 2 (two) times daily. 05/22/18   Desiree Hane, MD  folic acid (FOLVITE) 1 MG tablet Take 2 tablets (2 mg total) by mouth daily. 06/16/18   [name], MD  magic mouthwash SOLN Take 5 mLs by mouth 4 (four) times daily as needed for mouth pain. Components benadryl  525 mg, hydrocortisone 60 mg and nystatin 0.6 mg. 240 ml Patient not taking: Reported on 06/23/2018 05/11/18   [name], MD  prochlorperazine (COMPAZINE) 10 MG tablet TAKE 1 TABLET (10 MG TOTAL) BY MOUTH EVERY 6 (SIX) HOURS AS NEEDED (NAUSEA OR VOMITING). Patient not taking: Reported on 06/23/2018 03/23/18   [name], MD     Family History  Problem Relation Age of Onset  . COPD Mother   . COPD Father     Social History   Socioeconomic History  . Marital status: Married    Spouse name: Not on file  . Number of children: Not on  file  . Years of education: Not on file  . Highest education level: Not on file  Occupational History  . Not on file  Social Needs  . Financial resource strain: Not on file  . Food insecurity:    Worry: Not on file    Inability: Not on file  . Transportation needs:    Medical: Not on file    Non-medical: Not on file  Tobacco Use  . Smoking status: Never Smoker  . Smokeless tobacco: Never Used  . Tobacco comment: never used product  Substance and Sexual Activity  . Alcohol use: Never    Alcohol/week: 0.0 oz     Frequency: Never  . Drug use: Never  . Sexual activity: Not on file  Lifestyle  . Physical activity:    Days per week: Not on file    Minutes per session: Not on file  . Stress: Not on file  Relationships  . Social connections:    Talks on phone: Not on file    Gets together: Not on file    Attends religious service: Not on file    Active member of club or organization: Not on file    Attends meetings of clubs or organizations: Not on file    Relationship status: Not on file  Other Topics Concern  . Not on file  Social History Narrative  . Not on file    Review of Systems: A 12 point ROS discussed and pertinent positives are indicated in the HPI above.  All other systems are negative.  Review of Systems  Vital Signs: BP (!) 131/97 (BP Location: Left Arm)   Pulse 89   Temp 98.3 F (36.8 C) (Oral)   Resp 18   SpO2 99%   Physical Exam  Constitutional: She is oriented to person, place, and time. She appears well-developed and well-nourished. No distress.  HENT:  Head: Normocephalic.  Mouth/Throat: Oropharynx is clear and moist.  Neck: Normal range of motion. No JVD present.  Cardiovascular: Normal rate, regular rhythm and normal heart sounds.  Pulmonary/Chest: Effort normal and breath sounds normal. No respiratory distress.  Neurological: She is alert and oriented to person, place, and time.  Skin: Skin is warm and dry.  (L)port site clean.  Psychiatric: She has a normal mood and affect.     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 The patient has undergone neoadjuvant chemotherapy. LABS:  GFR: 57 EXAM: BILATERAL BREAST MRI WITH AND WITHOUT CONTRAST TECHNIQUE: Multiplanar, multisequence MR images of both breasts were obtained prior to and following the intravenous  administration of 18 ml of MultiHance. THREE-DIMENSIONAL MR IMAGE RENDERING ON INDEPENDENT WORKSTATION: Three-dimensional MR images were rendered by post-processing of the original MR data on an independent workstation. The three-dimensional MR images were interpreted, and findings are reported in the following complete MRI report for this study. Three dimensional images were evaluated at the independent DynaCad workstation COMPARISON:  Previous exam(s). FINDINGS: Breast composition: b. Scattered fibroglandular tissue. Background parenchymal enhancement: Mild Right breast: Signal void from post biopsy tissue marker is identified in the upper outer quadrant of the right breast at middle depth at the site of the patient's biopsy-proven malignancy. There is subtle, associated non-mass enhancement measuring approximately 3 x 2 x 1 cm (AP x transverse x craniocaudal). No suspicious masslike or non-mass enhancement  identified within the remainder of the right breast. Left breast: No mass or abnormal enhancement. Lymph nodes: No abnormal appearing lymph nodes. Ancillary findings:  None. IMPRESSION: 1. Subtle non-mass enhancement spanning approximately 3 x 2 x 1 cm in the upper-outer right breast at the site of the patient's known malignancy. Pre-treatment MRI imaging is not available for comparison to assess for interval changes/treatment response. 2. No MRI evidence of malignancy on the left. RECOMMENDATION: Per clinical treatment plan. BI-RADS CATEGORY  6: Known biopsy-proven malignancy. Electronically Signed   By: Kristopher Oppenheim M.D.   On: 06/09/2018 16:05    Labs:  CBC: Recent Labs    05/22/18 0500 05/26/18 0915 06/16/18 0820 06/29/18 0834  WBC 9.9 9.6 7.0 6.4  HGB 10.0* 11.4* 12.3 12.9  HCT 31.5* 34.5* 36.9 38.2  PLT 59* 149 191 179    COAGS: Recent Labs    05/19/18 1500 05/19/18 2003 05/20/18 0500 05/21/18 0414 06/29/18 0834  INR  --   --   --   --  0.90  APTT 45* 57* 63* 63*  --      BMP: Recent Labs    05/20/18 0500 05/21/18 0528 05/22/18 0500 05/26/18 0915 06/16/18 0820 06/29/18 0834  NA 136 137 137 142 143 143  K 2.9* 3.3* 3.4* 3.3 3.2* 3.5  CL 103 105 104 101 109* 109  CO2 _0 GLUCOSE 110* 105* 98 99 103 102*  BUN 30* _1 CALCIUM 7.6* 7.8* 7.9* 7.8* 8.6 9.0  CREATININE 1.33* 1.25* 1.23* 1.00 1.40* 1.36*  GFRNONAA 40* 43* 44*  --   --  39*  GFRAA 46* 50* 51*  --   --  45*    LIVER FUNCTION TESTS: Recent Labs    05/19/18 0500 05/20/18 0500 05/26/18 0915 06/16/18 0820  BILITOT 1.0 0.9 0.9 0.8  AST _2 ALT 24 41 42 17  ALKPHOS 67 73 87* 61  PROT 5.0* 5.1* 5.9* 6.3*  ALBUMIN 3.0* 2.9* 3.5 3.7    TUMOR MARKERS: No results for input(s): AFPTM, CEA, CA199, CHROMGRNA in the last 8760 hours.  Assessment and Plan: Breast cancer DVT/PE Upcoming surgical procedure. Plan for retrievable IVC filter placement today Labs reviewed. Risks and benefits discussed with the patient including, but not limited to bleeding, infection, contrast induced renal failure, filter fracture or migration which can lead to emergency surgery or even death, strut penetration with damage or irritation to adjacent structures and caval thrombosis.  All of the patient's questions were answered, patient is agreeable to proceed. Consent signed and in chart.   Thank you for this interesting consult.  I greatly enjoyed meeting MIKAYELA DEATS and look forward to participating in their care.  A copy of this report was sent to the requesting provider on this date.  Electronically Signed: Ascencion Dike, PA-C 06/29/2018, 9:01 AM   I spent a total of 20 minutes in face to face in clinical consultation, greater than 50% of which was counseling/coordinating care for IVC filter

## 2018-06-29 NOTE — Pre-Procedure Instructions (Signed)
Marcia [name]  06/29/2018      [id number] [place], [place] - [address] [place] [address] Phone: [phone] Fax: [phone]    Your procedure is scheduled on July 05, 2018.  Report to [facility] Admitting at 930 AM.  Call this number if you have problems the morning of surgery:  [phone]   Remember:  Do not eat or drink after midnight.  You may drink clear liquids until 830 AM (3 hours prior to procedure start time).  Clear liquids allowed are:                    Water, Juice (non-citric and without pulp), Carbonated beverages, Clear Tea, Black Coffee only, Plain Jell-O only, Gatorade and Plain Popsicles only-NO MILK PRODUCTS    Take these medicines the morning of surgery with A SIP OF WATER  Tylenol-if needed Lorazepam (ativan)-if needed  Follow your surgeon's instructions on when to hold/resume Eliquis.  If no instructions were given call the office to determine how they would like to you take Eliquis  7 days prior to surgery STOP taking any Aspirin (unless otherwise instructed by your surgeon), Aleve, Naproxen, Ibuprofen, Motrin, Advil, Goody's, BC's, all herbal medications, fish oil, and all vitamins    Do not wear jewelry, make-up or nail polish.  Do not wear lotions, powders, or perfumes, or deodorant.  Do not shave 48 hours prior to surgery.    Do not bring valuables to the hospital.  [facility] is not responsible for any belongings or valuables.  Contacts, dentures or bridgework may not be worn into surgery.  Leave your suitcase in the car.  After surgery it may be brought to your room.  For patients admitted to the hospital, discharge time will be determined by your treatment team.  Patients discharged the day of surgery will not be allowed to drive home.    [place]- Preparing For Surgery  Before surgery, you can play an important role. Because skin is not sterile, your skin needs to be  as free of germs as possible. You can reduce the number of germs on your skin by washing with CHG (chlorahexidine gluconate) Soap before surgery.  CHG is an antiseptic cleaner which kills germs and bonds with the skin to continue killing germs even after washing.    Oral Hygiene is also important to reduce your risk of infection.  Remember - BRUSH YOUR TEETH THE MORNING OF SURGERY WITH YOUR REGULAR TOOTHPASTE  Please do not use if you have an allergy to CHG or antibacterial soaps. If your skin becomes reddened/irritated stop using the CHG.  Do not shave (including legs and underarms) for at least 48 hours prior to first CHG shower. It is OK to shave your face.  Please follow these instructions carefully.   1. Shower the NIGHT BEFORE SURGERY and the MORNING OF SURGERY with CHG.   2. If you chose to wash your hair, wash your hair first as usual with your normal shampoo.  3. After you shampoo, rinse your hair and body thoroughly to remove the shampoo.  4. Use CHG as you would any other liquid soap. You can apply CHG directly to the skin and wash gently with a scrungie or a clean washcloth.   5. Apply the CHG Soap to your body ONLY FROM THE NECK DOWN.  Do not use on open wounds or open sores. Avoid contact with your  eyes, ears, mouth and genitals (private parts). Wash Face and genitals (private parts)  with your normal soap.  6. Wash thoroughly, paying special attention to the area where your surgery will be performed.  7. Thoroughly rinse your body with warm water from the neck down.  8. DO NOT shower/wash with your normal soap after using and rinsing off the CHG Soap.  9. Pat yourself dry with a CLEAN TOWEL.  10. Wear CLEAN PAJAMAS to bed the night before surgery, wear comfortable clothes the morning of surgery  11. Place CLEAN SHEETS on your bed the night of your first shower and DO NOT SLEEP WITH PETS.  Day of Surgery:  Do not apply any deodorants/lotions.  Please wear clean clothes  to the hospital/surgery center.   Remember to brush your teeth WITH YOUR REGULAR TOOTHPASTE.  Please read over the following fact sheets that you were given. Pain Booklet, Coughing and Deep Breathing and Surgical Site Infection Prevention

## 2018-06-30 ENCOUNTER — Encounter (HOSPITAL_COMMUNITY): Payer: Self-pay

## 2018-06-30 ENCOUNTER — Other Ambulatory Visit: Payer: Self-pay

## 2018-06-30 ENCOUNTER — Encounter (HOSPITAL_COMMUNITY)
Admission: RE | Admit: 2018-06-30 | Discharge: 2018-06-30 | Disposition: A | Discharge status: Home / Self Care | Payer: 59 | Source: Ambulatory Visit | Attending: General Surgery | Admitting: General Surgery

## 2018-06-30 DIAGNOSIS — Z01818 Encounter for other preprocedural examination: Secondary | ICD-10-CM | POA: Diagnosis present

## 2018-06-30 DIAGNOSIS — D171 Benign lipomatous neoplasm of skin and subcutaneous tissue of trunk: Secondary | ICD-10-CM | POA: Insufficient documentation

## 2018-06-30 DIAGNOSIS — Z170 Estrogen receptor positive status [ER+]: Secondary | ICD-10-CM | POA: Diagnosis not present

## 2018-06-30 HISTORY — DX: Acute embolism and thrombosis of unspecified deep veins of unspecified lower extremity: I82.409

## 2018-06-30 HISTORY — DX: Other pulmonary embolism without acute cor pulmonale: I26.99

## 2018-06-30 HISTORY — DX: Chronic kidney disease, unspecified: N18.9

## 2018-06-30 HISTORY — DX: Transient cerebral ischemic attack, unspecified: G45.9

## 2018-06-30 NOTE — Progress Notes (Addendum)
PCP - Jilda Panda Cardiologist - denies Denies neurologist Oncologist- Dr. [name]  Chest x-ray - 05/14/18 EKG - 05/19/18 ECHO - 04/2018  Pt had CBC with Diff and BMET drawn yesterday when IVC filter was placed. I spoke with Dr. Dalbert Batman, and there is no need to repeat those labs for surgery, unless needed per anesthesia. I spoke with Ebony Hail, [place] with anesthesia and no need to be drawn today.   Blood Thinner Instructions: Hold Eliquis for 3 days prior to surgery. Last dose 07/01/18  Anesthesia review: recent TIA per patient, medical history as well as labs. Pt requesting to have PORT used DOS, I explained to patient that we do not used Ports for anesthesia and she will need to discuss this with her anesthesiologist DOS.   Patient denies shortness of breath, fever, cough and chest pain at PAT appointment  Patient verbalized understanding of instructions that were given to them at the PAT appointment. Patient was also instructed that they will need to review over the PAT instructions again at home before surgery.

## 2018-07-01 ENCOUNTER — Encounter (HOSPITAL_COMMUNITY): Payer: Self-pay

## 2018-07-01 NOTE — Progress Notes (Signed)
Anesthesia Chart Review:   Case:  [id number] Date/Time:  07/05/18 1120   Procedure:  RIGHT BREAST LUMPECTOMY WITH RADIOACTIVE SEED AND SENTINEL LYMPH NODE BIOPSY,INJECT BLUE DYE RIGHT BREAST (Right Breast)   Anesthesia type:  General   Pre-op diagnosis:  RIGHT BREAST CANCER   Location:  [facility] ROOM 09 / [facility]   Surgeon:  Fanny Skates, MD      DISCUSSION: - Pt is a 67 year old female with hx TIA vs CVA (05/09/18), HTN, DVT, PE (05/14/18), s/p IVC filter 06/29/18  - Hospitalized 6/21-6/29/19 for acute PE, anemia.   - Pt hospitalized 6/16-6/18/19 at [facility] for acute TIA vs CVA.  Pt presented with expressive aphasia, LUE/LLE weakness; [facility] neurologist recommended tPA; which pt received.  Face to face visit with neurologist 05/10/18 documents no neurological deficits.   Hgb was 6.1, WBC count 1.89, and platelets 84 at time of discharge; hematology evaluation recommended but pt refused, preferring to f/u with her oncologist. Pt signed out [place] (notes found in correspondence 05/23/18 in media tab)   - Pt to hold xarelto 3 days prior to surgery  - Note: pt requests port-a-cath be used for access perioperatively     VS: BP 119/83   Pulse 88   Temp 36.7 C   Resp 18   Ht '4\' 11"'$  (1.499 m)   Wt 86.9 kg   SpO2 98%   BMI 38.70 kg/m    PROVIDERS: - PCP is Jilda Panda, MD - Hem-onc is Burney Gauze, MD   LABS:  - Labs 06/29/18 reviewed. CBC with diff, BMP, PT/INR are acceptable for surgery.  - CMP 06/16/18 shows normal liver function.  - Dr. Dalbert Batman has ordered PT/PTT for day of surgery   IMAGES:  CT angio chest 05/14/18:  - The study is positive for pulmonary embolus. There is no evidence for RIGHT heart strain. - Bilateral airspace filling and septal thickening consistent with mild edema.  CXR 05/14/18:  - Port-A-Cath tip in superior vena cava.  - No pneumothorax. No edema or consolidation. No evident adenopathy.   EKG 05/19/18: Sinus tachycardia (109  bpm)   CV:  Echo 05/15/18:  - Left ventricle: The cavity size was normal. Systolic function was normal. The estimated ejection fraction was in the range of 55% to 60%. - Pericardium, extracardiac: A trivial pericardial effusion was identified.  Carotid duplex 05/10/18 (found in correspondence 05/23/18 in media tab, pg 33):  - Normal exam without hemodynamically significant stenosis   Past Medical History:  Diagnosis Date  . Anemia   . Cancer [facility])    Multiple Myeloma  . Chronic kidney disease    d/t multiple myeloma  . Counseling regarding goals of care 02/02/2018  . DVT (deep venous thrombosis) (HCC)    right and left leg  . Hypertension   . 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 Pulmonary embolism ([place])    lung  . TIA (transient ischemic attack)    May 07, 2018    Past Surgical History:  Procedure Laterality Date  . ABDOMINAL HYSTERECTOMY     partial  . APPENDECTOMY    . BREAST SURGERY     biopsy  . IR IVC FILTER PLMT / S&I /IMG GUID/MOD SED  06/29/2018  . PORTACATH PLACEMENT Left 02/12/2018   Procedure: INSERTION PORT-A-CATH;  Surgeon: Fanny Skates, MD;  Location: [facility] OR;  Service: General;  Laterality: Left;  . TUBAL LIGATION      MEDICATIONS: . acetaminophen (TYLENOL) 325 MG tablet  . apixaban (ELIQUIS) 5 MG TABS tablet  . doxylamine, Sleep, (UNISOM) 25 MG tablet  . feeding supplement, ENSURE ENLIVE, (ENSURE ENLIVE) LIQD  . folic acid (FOLVITE) 1 MG tablet  . lidocaine-prilocaine (EMLA) cream  . LORazepam (ATIVAN) 0.5 MG tablet  . magic mouthwash SOLN  . prochlorperazine (COMPAZINE) 10 MG tablet   No current facility-administered medications for this encounter.    - Pt to hold xarelto 3 days prior to surgery   If no changes, I anticipate pt can proceed with surgery as scheduled.   Willeen Cass, FNP-BC [facility] Short Stay  Surgical Center/Anesthesiology Phone: [phone] 07/01/2018 12:30 PM

## 2018-07-02 ENCOUNTER — Ambulatory Visit
Admission: RE | Admit: 2018-07-02 | Discharge: 2018-07-02 | Disposition: A | Discharge status: Home / Self Care | Payer: 59 | Source: Ambulatory Visit | Attending: General Surgery | Admitting: General Surgery

## 2018-07-02 DIAGNOSIS — C50411 Malignant neoplasm of upper-outer quadrant of right female breast: Secondary | ICD-10-CM

## 2018-07-02 DIAGNOSIS — Z171 Estrogen receptor negative status [ER-]: Principal | ICD-10-CM

## 2018-07-03 NOTE — H&P (Signed)
Marcia [name] Location: [facility] Patient #: [id number] DOB: 08/11/1951 Married / Language: English / Race: White Female       History of Present Illness      This is a 67 year old female who returns to discuss definitive breast surgery following neoadjuvant chemotherapy. Her husband is with her throughout the encounter. Dr. [name] is her oncologist. Dr. Jilda Panda is her PCP. Dr. [name] is her gynecologist.      She has significant past history for multiple myeloma in remission with stem cell transplant 2011. Had right sided dual-lumen Hickman subsequently was removed. This was difficult. No prior breast problems Imaging studies a few months ago showed a vague density in the medial breast. On compression views this looked fine and then there was a 5 cm density in the lateral right breast from central to peripheral. The right axilla was normal by ultrasound. Image guided biopsy of the lateral breast showed invasive carcinoma, positive LV, HER-2 positive, ER and PR negative, Ki-67 75% Initially MRI could not be done because of renal function with creatinine 1.6.       I placed her Port-A-Cath on the left subclavian site on February 12, 2018 and that has worked well       She has now completed her neoadjuvant chemotherapy this is been a little bit hard on her due to malaise and fatigue... she is pleased to be finished Renal function improved and of treatment MRI was performed and showed that the right breast mass was down to about 3 cm. The lymph nodes looked normal. Good partial response.       She had to be hospitalized a few weeks ago because of DVT and pulmonary embolism. She is on Eliquis now. Dr. [name] recommends temporary vena cava filter in the perioperative period which is reasonable      Family history negative for breast or ovarian cancer Social history reveals she is married and lives in [place] with her husband. One child in [place] and 1 child  in [place]. Denies alcohol or tobacco      Exam today was good. The breast is soft without palpable mass or adenopathy. She is strongly motivated for breast conservation and I think we can try to do that  Plan: Schedule for right breast lumpectomy with radioactive seed and right axillary deep sentinel lymph node biopsy The Port-A-Cath will be left in for the possibility of protocol chemotherapy Dr. [name] to arrange temporary vena cava filter placement preop Patient is to stop Eliquis 3 days preop and restart one day postop   . She agrees with this plan.   Allergies Latex  Allergies Reconciled  Bactrim *ANTI-INFECTIVE AGENTS - MISC.*  Heparin Sodium (Porcine) *ANTICOAGULANTS*  Zofran *ANTIEMETICS*  Clarithromycin *CHEMICALS*  Macrodantin *URINARY ANTI-INFECTIVES*   Vitals  Weight: 195.38 lb Height: 60in Body Surface Area: 1.85 m Body Mass Index: 38.16 kg/m  Temp.: 98.61F(Oral)  Pulse: 107 (Regular)  Resp.: 18 (Unlabored)  P.OX: 98% (Room air) BP: 124/82 (Sitting, Left Arm, Standard)    Physical Exam  General Mental Status-Alert. General Appearance-Consistent with stated age. Hydration-Well hydrated. Voice-Normal. Note: BMI 38   Head and Neck Head-normocephalic, atraumatic with no lesions or palpable masses. Trachea-midline. Thyroid Gland Characteristics - normal size and consistency. Note: Alopicia.   Eye Eyeball - Bilateral-Extraocular movements intact. Sclera/Conjunctiva - Bilateral-No scleral icterus.  Chest and Lung Exam Chest and lung exam reveals -quiet, even and easy respiratory effort with no use of accessory muscles and on auscultation, normal  breath sounds, no adventitious sounds and normal vocal resonance. Inspection Chest Wall - Normal. Back - normal.  Breast Note: Right breast is larger than the left and the patient states this has always been that way. Port-A-Cath palpable left infraclavicular  area. There is really no mass or skin changes on either breast. There is no axillary adenopathy.   Cardiovascular Cardiovascular examination reveals -normal heart sounds, regular rate and rhythm with no murmurs and normal pedal pulses bilaterally.  Abdomen Inspection Inspection of the abdomen reveals - No Hernias. Skin - Scar - no surgical scars. Palpation/Percussion Palpation and Percussion of the abdomen reveal - Soft, Non Tender, No Rebound tenderness, No Rigidity (guarding) and No hepatosplenomegaly. Auscultation Auscultation of the abdomen reveals - Bowel sounds normal.  Neurologic Neurologic evaluation reveals -alert and oriented x 3 with no impairment of recent or remote memory. Mental Status-Normal.  Musculoskeletal Normal Exam - Left-Upper Extremity Strength Normal and Lower Extremity Strength Normal. Normal Exam - Right-Upper Extremity Strength Normal and Lower Extremity Strength Normal.  Lymphatic Head & Neck  General Head & Neck Lymphatics: Bilateral - Description - Normal. Axillary  General Axillary Region: Bilateral - Description - Normal. Tenderness - Non Tender. Femoral & Inguinal  Generalized Femoral & Inguinal Lymphatics: Bilateral - Description - Normal. Tenderness - Non Tender.    Assessment & Plan PRIMARY CANCER OF UPPER OUTER QUADRANT OF RIGHT FEMALE BREAST (C50.411)  You have responded to chemotherapy I do not feel a mass in the breast I don't feel any abnormal lymph nodes The MRI shows the enhancement to be about half the size it was before I think you are a candidate for lumpectomy and you state that is your preference  You will be scheduled for right breast lumpectomy with radioactive seed localization and right axillary sentinel lymph node biopsy The Port-A-Cath will be left in I have discussed the indications, techniques, and numerous risk of this surgery with you and your husband  Because of your pulmonary embolism, Dr. [name]  will schedule you for insertion of a temporary inferior vena cava filter that will be placed several days prior to your surgery you will stop taking the Eliquis a full 3 days prior to your surgery If everything goes well we will restart the Eliquis 1 day postop  HISTORY OF MULTIPLE MYELOMA (Z85.79) HISTORY OF STEM CELL TRANSPLANT (Z94.84) BMI 40.0-44.9, ADULT (Z68.41) HISTORY OF PULMONARY EMBOLISM (Z86.711) ANTICOAGULATED (Z79.01) Impression: eliquis    Marcia [name], M.D., [facility] General and Minimally invasive Surgery Breast and Colorectal Surgery Office:   [phone] Pager:   [phone]

## 2018-07-05 ENCOUNTER — Ambulatory Visit (HOSPITAL_COMMUNITY): Payer: 59 | Admitting: Anesthesiology

## 2018-07-05 ENCOUNTER — Encounter (HOSPITAL_COMMUNITY)
Admission: RE | Admit: 2018-07-05 | Discharge: 2018-07-05 | Disposition: A | Discharge status: Home / Self Care | Payer: 59 | Source: Ambulatory Visit | Attending: General Surgery | Admitting: General Surgery

## 2018-07-05 ENCOUNTER — Ambulatory Visit: Payer: Medicare Other | Admitting: Hematology & Oncology

## 2018-07-05 ENCOUNTER — Encounter (HOSPITAL_COMMUNITY)
Admission: RE | Disposition: A | Discharge status: Home / Self Care | Payer: Self-pay | Source: Ambulatory Visit | Attending: General Surgery

## 2018-07-05 ENCOUNTER — Ambulatory Visit (HOSPITAL_COMMUNITY)
Admission: RE | Admit: 2018-07-05 | Discharge: 2018-07-05 | Disposition: A | Discharge status: Home / Self Care | Payer: 59 | Source: Ambulatory Visit | Attending: General Surgery | Admitting: General Surgery

## 2018-07-05 ENCOUNTER — Ambulatory Visit
Admission: RE | Admit: 2018-07-05 | Discharge: 2018-07-05 | Disposition: A | Discharge status: Home / Self Care | Payer: 59 | Source: Ambulatory Visit | Attending: General Surgery | Admitting: General Surgery

## 2018-07-05 ENCOUNTER — Encounter (HOSPITAL_COMMUNITY): Payer: Self-pay | Admitting: Certified Registered Nurse Anesthetist

## 2018-07-05 ENCOUNTER — Other Ambulatory Visit: Payer: Medicare Other

## 2018-07-05 ENCOUNTER — Ambulatory Visit (HOSPITAL_COMMUNITY): Payer: 59 | Admitting: Vascular Surgery

## 2018-07-05 DIAGNOSIS — C50911 Malignant neoplasm of unspecified site of right female breast: Secondary | ICD-10-CM

## 2018-07-05 DIAGNOSIS — N62 Hypertrophy of breast: Secondary | ICD-10-CM | POA: Insufficient documentation

## 2018-07-05 DIAGNOSIS — N6011 Diffuse cystic mastopathy of right breast: Secondary | ICD-10-CM | POA: Diagnosis not present

## 2018-07-05 DIAGNOSIS — Z7901 Long term (current) use of anticoagulants: Secondary | ICD-10-CM | POA: Diagnosis not present

## 2018-07-05 DIAGNOSIS — Z881 Allergy status to other antibiotic agents status: Secondary | ICD-10-CM | POA: Diagnosis not present

## 2018-07-05 DIAGNOSIS — Z91040 Latex allergy status: Secondary | ICD-10-CM | POA: Insufficient documentation

## 2018-07-05 DIAGNOSIS — Z79899 Other long term (current) drug therapy: Secondary | ICD-10-CM | POA: Insufficient documentation

## 2018-07-05 DIAGNOSIS — Z171 Estrogen receptor negative status [ER-]: Principal | ICD-10-CM

## 2018-07-05 DIAGNOSIS — Z9221 Personal history of antineoplastic chemotherapy: Secondary | ICD-10-CM | POA: Diagnosis not present

## 2018-07-05 DIAGNOSIS — Z9484 Stem cells transplant status: Secondary | ICD-10-CM | POA: Diagnosis not present

## 2018-07-05 DIAGNOSIS — Z888 Allergy status to other drugs, medicaments and biological substances status: Secondary | ICD-10-CM | POA: Insufficient documentation

## 2018-07-05 DIAGNOSIS — C50411 Malignant neoplasm of upper-outer quadrant of right female breast: Secondary | ICD-10-CM | POA: Diagnosis not present

## 2018-07-05 DIAGNOSIS — Z6838 Body mass index (BMI) 38.0-38.9, adult: Secondary | ICD-10-CM | POA: Diagnosis not present

## 2018-07-05 DIAGNOSIS — Z86718 Personal history of other venous thrombosis and embolism: Secondary | ICD-10-CM | POA: Insufficient documentation

## 2018-07-05 DIAGNOSIS — Z86711 Personal history of pulmonary embolism: Secondary | ICD-10-CM | POA: Diagnosis not present

## 2018-07-05 DIAGNOSIS — C9001 Multiple myeloma in remission: Secondary | ICD-10-CM | POA: Insufficient documentation

## 2018-07-05 DIAGNOSIS — Z8673 Personal history of transient ischemic attack (TIA), and cerebral infarction without residual deficits: Secondary | ICD-10-CM | POA: Diagnosis not present

## 2018-07-05 DIAGNOSIS — Z882 Allergy status to sulfonamides status: Secondary | ICD-10-CM | POA: Insufficient documentation

## 2018-07-05 HISTORY — PX: BREAST LUMPECTOMY WITH RADIOACTIVE SEED AND SENTINEL LYMPH NODE BIOPSY: SHX6550

## 2018-07-05 LAB — PROTIME-INR
INR: 0.91
PROTHROMBIN TIME: 12.1 s (ref 11.4–15.2)

## 2018-07-05 LAB — APTT: APTT: 26 s (ref 24–36)

## 2018-07-05 SURGERY — BREAST LUMPECTOMY WITH RADIOACTIVE SEED AND SENTINEL LYMPH NODE BIOPSY
Anesthesia: General | Site: Breast | Laterality: Right

## 2018-07-05 MED ORDER — PHENYLEPHRINE HCL 10 MG/ML IJ SOLN
INTRAMUSCULAR | Status: DC | PRN
  Administered 2018-07-05: 40 ug via INTRAVENOUS
  Administered 2018-07-05 (×4): 80 ug via INTRAVENOUS

## 2018-07-05 MED ORDER — GABAPENTIN 300 MG PO CAPS
300.0000 mg | ORAL_CAPSULE | ORAL | Status: AC
  Administered 2018-07-05: 300 mg via ORAL
  Filled 2018-07-05: qty 1

## 2018-07-05 MED ORDER — SODIUM CHLORIDE 0.9 % IJ SOLN
INTRAVENOUS | Status: DC | PRN
  Administered 2018-07-05: 11:00:00 via INTRAMUSCULAR

## 2018-07-05 MED ORDER — SODIUM CHLORIDE 0.9% FLUSH: 3.0000 mL | INTRAVENOUS | Status: DC | PRN

## 2018-07-05 MED ORDER — ROPIVACAINE HCL 7.5 MG/ML IJ SOLN
INTRAMUSCULAR | Status: DC | PRN
  Administered 2018-07-05: 25 mL via PERINEURAL

## 2018-07-05 MED ORDER — PHENYLEPHRINE 40 MCG/ML (10ML) SYRINGE FOR IV PUSH (FOR BLOOD PRESSURE SUPPORT)
PREFILLED_SYRINGE | INTRAVENOUS | Status: AC
  Filled 2018-07-05: qty 10

## 2018-07-05 MED ORDER — LACTATED RINGERS IV SOLN
INTRAVENOUS | Status: DC
  Administered 2018-07-05: 11:00:00 via INTRAVENOUS

## 2018-07-05 MED ORDER — MIDAZOLAM HCL 2 MG/2ML IJ SOLN
2.0000 mg | Freq: Once | INTRAMUSCULAR | Status: AC
  Administered 2018-07-05: 2 mg via INTRAVENOUS

## 2018-07-05 MED ORDER — CHLORHEXIDINE GLUCONATE CLOTH 2 % EX PADS: 6.0000 | MEDICATED_PAD | Freq: Once | CUTANEOUS | Status: DC

## 2018-07-05 MED ORDER — DEXAMETHASONE SODIUM PHOSPHATE 10 MG/ML IJ SOLN
INTRAMUSCULAR | Status: AC
  Filled 2018-07-05: qty 1

## 2018-07-05 MED ORDER — OXYCODONE HCL 5 MG PO TABS
ORAL_TABLET | ORAL | Status: AC
  Filled 2018-07-05: qty 2

## 2018-07-05 MED ORDER — METHYLENE BLUE 0.5 % INJ SOLN
INTRAVENOUS | Status: AC
  Filled 2018-07-05: qty 10

## 2018-07-05 MED ORDER — DEXAMETHASONE SODIUM PHOSPHATE 10 MG/ML IJ SOLN
INTRAMUSCULAR | Status: DC | PRN
  Administered 2018-07-05: 10 mg via INTRAVENOUS

## 2018-07-05 MED ORDER — CEFAZOLIN SODIUM-DEXTROSE 2-4 GM/100ML-% IV SOLN
2.0000 g | INTRAVENOUS | Status: AC
  Administered 2018-07-05: 2 g via INTRAVENOUS
  Filled 2018-07-05: qty 100

## 2018-07-05 MED ORDER — SODIUM CHLORIDE 0.9 % IV SOLN: 250.0000 mL | INTRAVENOUS | Status: DC | PRN

## 2018-07-05 MED ORDER — FENTANYL CITRATE (PF) 100 MCG/2ML IJ SOLN
INTRAMUSCULAR | Status: AC
  Filled 2018-07-05: qty 2

## 2018-07-05 MED ORDER — ANTICOAGULANT SODIUM CITRATE 4% (200MG/5ML) IV SOLN
5.0000 mL | Freq: Once | Status: AC
  Administered 2018-07-05: 5 mL via INTRAVENOUS
  Filled 2018-07-05: qty 5

## 2018-07-05 MED ORDER — HYDROCODONE-ACETAMINOPHEN 5-325 MG PO TABS: 1.0000 | ORAL_TABLET | Freq: Four times a day (QID) | ORAL | 0 refills | Status: DC | PRN

## 2018-07-05 MED ORDER — TECHNETIUM TC 99M SULFUR COLLOID FILTERED
1.0000 | Freq: Once | INTRAVENOUS | Status: AC | PRN
  Administered 2018-07-05: 1 via INTRADERMAL

## 2018-07-05 MED ORDER — BUPIVACAINE-EPINEPHRINE 0.25% -1:200000 IJ SOLN
INTRAMUSCULAR | Status: DC | PRN
  Administered 2018-07-05: 13 mL

## 2018-07-05 MED ORDER — ACETAMINOPHEN 325 MG PO TABS
650.0000 mg | ORAL_TABLET | ORAL | Status: DC | PRN
  Filled 2018-07-05: qty 2

## 2018-07-05 MED ORDER — FENTANYL CITRATE (PF) 100 MCG/2ML IJ SOLN
INTRAMUSCULAR | Status: DC | PRN
  Administered 2018-07-05 (×2): 50 ug via INTRAVENOUS

## 2018-07-05 MED ORDER — OXYCODONE HCL 5 MG PO TABS
5.0000 mg | ORAL_TABLET | ORAL | Status: DC | PRN
  Administered 2018-07-05: 10 mg via ORAL

## 2018-07-05 MED ORDER — ACETAMINOPHEN 650 MG RE SUPP
650.0000 mg | RECTAL | Status: DC | PRN
  Filled 2018-07-05: qty 1

## 2018-07-05 MED ORDER — FENTANYL CITRATE (PF) 250 MCG/5ML IJ SOLN
INTRAMUSCULAR | Status: AC
Start: 2018-07-05 — End: ?
  Filled 2018-07-05: qty 5

## 2018-07-05 MED ORDER — 0.9 % SODIUM CHLORIDE (POUR BTL) OPTIME
TOPICAL | Status: DC | PRN
  Administered 2018-07-05: 1000 mL

## 2018-07-05 MED ORDER — SODIUM CHLORIDE 0.9% FLUSH: 3.0000 mL | Freq: Two times a day (BID) | INTRAVENOUS | Status: DC

## 2018-07-05 MED ORDER — SODIUM CHLORIDE 0.9 % IJ SOLN
INTRAMUSCULAR | Status: AC
  Filled 2018-07-05: qty 10

## 2018-07-05 MED ORDER — CEFAZOLIN SODIUM-DEXTROSE 2-4 GM/100ML-% IV SOLN
INTRAVENOUS | Status: AC
  Filled 2018-07-05: qty 100

## 2018-07-05 MED ORDER — FENTANYL CITRATE (PF) 100 MCG/2ML IJ SOLN
100.0000 ug | Freq: Once | INTRAMUSCULAR | Status: AC
  Administered 2018-07-05: 100 ug via INTRAVENOUS

## 2018-07-05 MED ORDER — ACETAMINOPHEN 500 MG PO TABS
1000.0000 mg | ORAL_TABLET | ORAL | Status: AC
  Administered 2018-07-05: 1000 mg via ORAL
  Filled 2018-07-05: qty 2

## 2018-07-05 MED ORDER — PROPOFOL 10 MG/ML IV BOLUS
INTRAVENOUS | Status: AC
  Filled 2018-07-05: qty 20

## 2018-07-05 MED ORDER — BUPIVACAINE-EPINEPHRINE (PF) 0.25% -1:200000 IJ SOLN
INTRAMUSCULAR | Status: AC
  Filled 2018-07-05: qty 30

## 2018-07-05 MED ORDER — LIDOCAINE 2% (20 MG/ML) 5 ML SYRINGE
INTRAMUSCULAR | Status: AC
  Filled 2018-07-05: qty 5

## 2018-07-05 MED ORDER — MIDAZOLAM HCL 2 MG/2ML IJ SOLN
INTRAMUSCULAR | Status: AC
  Filled 2018-07-05: qty 2

## 2018-07-05 MED ORDER — LACTATED RINGERS IV SOLN: INTRAVENOUS | Status: DC

## 2018-07-05 MED ORDER — PROPOFOL 10 MG/ML IV BOLUS
INTRAVENOUS | Status: DC | PRN
  Administered 2018-07-05: 150 mg via INTRAVENOUS

## 2018-07-05 MED ORDER — FENTANYL CITRATE (PF) 100 MCG/2ML IJ SOLN
25.0000 ug | INTRAMUSCULAR | Status: DC | PRN
  Administered 2018-07-05: 50 ug via INTRAVENOUS

## 2018-07-05 MED ORDER — LIDOCAINE 2% (20 MG/ML) 5 ML SYRINGE
INTRAMUSCULAR | Status: DC | PRN
  Administered 2018-07-05: 80 mg via INTRAVENOUS

## 2018-07-05 SURGICAL SUPPLY — 54 items
ADH SKN CLS APL DERMABOND .7 (GAUZE/BANDAGES/DRESSINGS) ×1
APPLIER CLIP 9.375 MED OPEN (MISCELLANEOUS) ×3
APR CLP MED 9.3 20 MLT OPN (MISCELLANEOUS) ×1
BINDER BREAST LRG (GAUZE/BANDAGES/DRESSINGS) IMPLANT
BINDER BREAST XLRG (GAUZE/BANDAGES/DRESSINGS) ×2 IMPLANT
BINDER BREAST XXLRG (GAUZE/BANDAGES/DRESSINGS) ×2 IMPLANT
BLADE SURG 15 STRL LF DISP TIS (BLADE) ×2 IMPLANT
BLADE SURG 15 STRL SS (BLADE) ×6
CANISTER SUCT 3000ML PPV (MISCELLANEOUS) ×3 IMPLANT
CHLORAPREP W/TINT 26ML (MISCELLANEOUS) ×3 IMPLANT
CLIP APPLIE 9.375 MED OPEN (MISCELLANEOUS) ×1 IMPLANT
CONT SPEC 4OZ CLIKSEAL STRL BL (MISCELLANEOUS) ×3 IMPLANT
COVER PROBE W GEL 5X96 (DRAPES) ×3 IMPLANT
COVER SURGICAL LIGHT HANDLE (MISCELLANEOUS) ×3 IMPLANT
DERMABOND ADVANCED (GAUZE/BANDAGES/DRESSINGS) ×2
DERMABOND ADVANCED .7 DNX12 (GAUZE/BANDAGES/DRESSINGS) ×1 IMPLANT
DEVICE DUBIN SPECIMEN MAMMOGRA (MISCELLANEOUS) ×3 IMPLANT
DRAPE CHEST BREAST 15X10 FENES (DRAPES) ×3 IMPLANT
DRAPE HALF SHEET 40X57 (DRAPES) ×3 IMPLANT
DRAPE UTILITY XL STRL (DRAPES) ×3 IMPLANT
DRSG PAD ABDOMINAL 8X10 ST (GAUZE/BANDAGES/DRESSINGS) ×3 IMPLANT
ELECT CAUTERY BLADE 6.4 (BLADE) ×3 IMPLANT
ELECT REM PT RETURN 9FT ADLT (ELECTROSURGICAL) ×3
ELECTRODE REM PT RTRN 9FT ADLT (ELECTROSURGICAL) ×1 IMPLANT
FILTER STRAW FLUID ASPIR (MISCELLANEOUS) ×2 IMPLANT
GAUZE SPONGE 4X4 12PLY STRL (GAUZE/BANDAGES/DRESSINGS) ×3 IMPLANT
GLOVE EUDERMIC 7 POWDERFREE (GLOVE) ×3 IMPLANT
GOWN STRL REUS W/ TWL LRG LVL3 (GOWN DISPOSABLE) ×1 IMPLANT
GOWN STRL REUS W/ TWL XL LVL3 (GOWN DISPOSABLE) ×1 IMPLANT
GOWN STRL REUS W/TWL LRG LVL3 (GOWN DISPOSABLE) ×3
GOWN STRL REUS W/TWL XL LVL3 (GOWN DISPOSABLE) ×3
ILLUMINATOR WAVEGUIDE N/F (MISCELLANEOUS) IMPLANT
KIT BASIN OR (CUSTOM PROCEDURE TRAY) ×3 IMPLANT
KIT MARKER MARGIN INK (KITS) ×3 IMPLANT
LIGHT WAVEGUIDE WIDE FLAT (MISCELLANEOUS) IMPLANT
NDL HYPO 25GX1X1/2 BEV (NEEDLE) ×1 IMPLANT
NDL SAFETY ECLIPSE 18X1.5 (NEEDLE) IMPLANT
NEEDLE HYPO 18GX1.5 SHARP (NEEDLE)
NEEDLE HYPO 25GX1X1/2 BEV (NEEDLE) ×3 IMPLANT
NS IRRIG 1000ML POUR BTL (IV SOLUTION) ×3 IMPLANT
PACK SURGICAL SETUP 50X90 (CUSTOM PROCEDURE TRAY) ×3 IMPLANT
PAD ABD 8X10 STRL (GAUZE/BANDAGES/DRESSINGS) ×2 IMPLANT
PENCIL BUTTON HOLSTER BLD 10FT (ELECTRODE) ×3 IMPLANT
SPONGE LAP 4X18 RFD (DISPOSABLE) ×5 IMPLANT
SUT MNCRL AB 4-0 PS2 18 (SUTURE) ×6 IMPLANT
SUT SILK 2 0 SH (SUTURE) ×3 IMPLANT
SUT VIC AB 3-0 SH 18 (SUTURE) ×5 IMPLANT
SYR BULB 3OZ (MISCELLANEOUS) ×3 IMPLANT
SYR CONTROL 10ML LL (SYRINGE) ×3 IMPLANT
TOWEL OR 17X24 6PK STRL BLUE (TOWEL DISPOSABLE) ×3 IMPLANT
TOWEL OR 17X26 10 PK STRL BLUE (TOWEL DISPOSABLE) ×3 IMPLANT
TUBE CONNECTING 12'X1/4 (SUCTIONS) ×1
TUBE CONNECTING 12X1/4 (SUCTIONS) ×2 IMPLANT
YANKAUER SUCT BULB TIP NO VENT (SUCTIONS) ×3 IMPLANT

## 2018-07-05 NOTE — Anesthesia Postprocedure Evaluation (Signed)
Anesthesia Post Note  Patient: Field seismologist  Procedure(s) Performed: RIGHT BREAST LUMPECTOMY WITH RADIOACTIVE SEED AND SENTINEL LYMPH NODE BIOPSY,INJECT BLUE DYE RIGHT BREAST (Right Breast)     Patient location during evaluation: PACU Anesthesia Type: General Level of consciousness: awake and alert Pain management: pain level controlled Vital Signs Assessment: post-procedure vital signs reviewed and stable Respiratory status: spontaneous breathing, nonlabored ventilation, respiratory function stable and patient connected to nasal cannula oxygen Cardiovascular status: blood pressure returned to baseline and stable Postop Assessment: no apparent nausea or vomiting Anesthetic complications: no    Last Vitals:  Vitals:   07/05/18 1240 07/05/18 1255  BP: 139/72 122/69  Pulse: 72 84  Resp: 20 (!) 21  Temp:    SpO2: 92% 98%    Last Pain:  Vitals:   07/05/18 1255  TempSrc:   PainSc: 3                  Naje Rice

## 2018-07-05 NOTE — Anesthesia Preprocedure Evaluation (Addendum)
Anesthesia Evaluation  Patient identified by MRN, date of birth, ID band Patient awake    Reviewed: Allergy & Precautions, NPO status , Patient's Chart, lab work & pertinent test results  Airway Mallampati: II  TM Distance: <3 FB Neck ROM: Full  Mouth opening: Limited Mouth Opening  Dental  (+) Teeth Intact, Dental Advisory Given   Pulmonary neg pulmonary ROS,    breath sounds clear to auscultation       Cardiovascular hypertension, Pt. on medications  Rhythm:Regular Rate:Normal  EKG 05/19/18: Sinus tachycardia (109 bpm Echo 05/15/18:   - Left ventricle: The cavity size was normal. Systolic function was normal. The estimated ejection fraction was in the range of 55% to 60%.  - Pericardium, extracardiac: A trivial pericardial effusion was identified.     Carotid duplex 05/10/18 (found in correspondence 05/23/18 in media tab, pg 33):   - Normal exam without hemodynamically significant stenosis         Neuro/Psych TIA   GI/Hepatic negative GI ROS, Neg liver ROS,   Endo/Other  Morbid obesity  Renal/GU      Musculoskeletal   Abdominal   Peds  Hematology negative hematology ROS (+) anemia ,   Anesthesia Other Findings   Reproductive/Obstetrics                            Lab Results  Component Value Date   WBC 6.4 06/29/2018   HGB 12.9 06/29/2018   HCT 38.2 06/29/2018   MCV 100.8 (H) 06/29/2018   PLT 179 06/29/2018   Lab Results  Component Value Date   CREATININE 1.36 (H) 06/29/2018   BUN 22 06/29/2018   NA 143 06/29/2018   K 3.5 06/29/2018   CL 109 06/29/2018   CO2 24 06/29/2018    Anesthesia Physical  Anesthesia Plan  ASA: III  Anesthesia Plan: General   Post-op Pain Management: GA combined w/ Regional for post-op pain   Induction: Intravenous  PONV Risk Score and Plan: 3 and Dexamethasone, Midazolam and Treatment may vary due to age or medical condition  Airway  Management Planned: LMA and Oral ETT  Additional Equipment:   Intra-op Plan:   Post-operative Plan: Extubation in OR  Informed Consent: I have reviewed the patients History and Physical, chart, labs and discussed the procedure including the risks, benefits and alternatives for the proposed anesthesia with the patient or authorized representative who has indicated his/her understanding and acceptance.   Dental advisory given  Plan Discussed with: CRNA, Anesthesiologist and Surgeon  Anesthesia Plan Comments:        Anesthesia Quick Evaluation

## 2018-07-05 NOTE — Discharge Instructions (Signed)
[facility] °Office Phone Number [phone] ° °BREAST BIOPSY/ PARTIAL MASTECTOMY: POST OP INSTRUCTIONS ° °Always review your discharge instruction sheet given to you by the facility where your surgery was performed. ° °IF YOU HAVE DISABILITY OR FAMILY LEAVE FORMS, YOU MUST BRING THEM TO THE OFFICE FOR PROCESSING.  DO NOT GIVE THEM TO YOUR DOCTOR. ° °1. A prescription for pain medication may be given to you upon discharge.  Take your pain medication as prescribed, if needed.  If narcotic pain medicine is not needed, then you may take acetaminophen (Tylenol) or ibuprofen (Advil) as needed. °2. Take your usually prescribed medications unless otherwise directed °3. If you need a refill on your pain medication, please contact your pharmacy.  They will contact our office to request authorization.  Prescriptions will not be filled after 5pm or on week-ends. °4. You should eat very light the first 24 hours after surgery, such as soup, crackers, pudding, etc.  Resume your normal diet the day after surgery. °5. Most patients will experience some swelling and bruising in the breast.  Ice packs and a good support bra will help.  Swelling and bruising can take several days to resolve.  °6. It is common to experience some constipation if taking pain medication after surgery.  Increasing fluid intake and taking a stool softener will usually help or prevent this problem from occurring.  A mild laxative (Milk of Magnesia or Miralax) should be taken according to package directions if there are no bowel movements after 48 hours. °7. Unless discharge instructions indicate otherwise, you may remove your bandages 24-48 hours after surgery, and you may shower at that time.  You may have steri-strips (small skin tapes) in place directly over the incision.  These strips should be left on the skin for 7-10 days.  If your surgeon used skin glue on the incision, you may shower in 24 hours.  The glue will flake off over the  next 2-3 weeks.  Any sutures or staples will be removed at the office during your follow-up visit. °8. ACTIVITIES:  You may resume regular daily activities (gradually increasing) beginning the next day.  Wearing a good support bra or sports bra minimizes pain and swelling.  You may have sexual intercourse when it is comfortable. °a. You may drive when you no longer are taking prescription pain medication, you can comfortably wear a seatbelt, and you can safely maneuver your car and apply brakes. °b. RETURN TO WORK:  ______________________________________________________________________________________ °9. You should see your doctor in the office for a follow-up appointment approximately two weeks after your surgery.  Your doctor’s nurse will typically make your follow-up appointment when she calls you with your pathology report.  Expect your pathology report 2-3 business days after your surgery.  You may call to check if you do not hear from us after three days. °10. OTHER INSTRUCTIONS: _______________________________________________________________________________________________ _____________________________________________________________________________________________________________________________________ °_____________________________________________________________________________________________________________________________________ °_____________________________________________________________________________________________________________________________________ ° °WHEN TO CALL YOUR DOCTOR: °1. Fever over 101.0 °2. Nausea and/or vomiting. °3. Extreme swelling or bruising. °4. Continued bleeding from incision. °5. Increased pain, redness, or drainage from the incision. ° °The clinic staff is available to answer your questions during regular business hours.  Please don’t hesitate to call and ask to speak to one of the nurses for clinical concerns.  If you have a medical emergency, go to the nearest  emergency room or call 911.  A surgeon from [facility] is always on call at the hospital. ° °For further questions, please visit [email or web address]  °

## 2018-07-05 NOTE — Op Note (Addendum)
Patient Name:           Marcia [name]   Date of Surgery:        07/05/2018  Pre op Diagnosis:      Invasive cancer right breast, upper outer quadrant                                      Status post neoadjuvant chemotherapy  Post op Diagnosis:    Same  Procedure:                 Inject blue dye right breast                                      Right breast lumpectomy with radioactive seed localization                                      Right axillary deep sentinel lymph node biopsy  Surgeon:                     Edsel Petrin. Dalbert Batman, M.D., FACS  Assistant:                      OR staff   Indications:      This is a 67 year old female who is brought to the operating room for definitive breast surgery following neoadjuvant chemotherapy. Dr. [name] is her oncologist. Dr. Jilda Panda is her PCP. Dr. [name] is her gynecologist.      She has significant past history for multiple myeloma in remission with stem cell transplant 2011. Had right sided dual-lumen Hickman subsequently was removed. This was difficult. No prior breast problems Imaging studies a few months ago showed a vague density in the medial right breast. On compression views this looked fine and then there was a 5 cm density in the lateral right breast from central to peripheral. The right axilla was normal by ultrasound. Image guided biopsy of the lateral breast showed invasive carcinoma, positive LVI, HER-2 positive, ER and PR negative, Ki-67 75% Initially MRI could not be done because of renal function with creatinine 1.6.       I placed her Port-A-Cath on the left subclavian site on February 12, 2018 and that has worked well       She has now completed her neoadjuvant chemotherapy Renal function improved and end-of- treatment MRI was performed and showed that the right breast non-mass enhancement was down to about 3 cm. The lymph nodes looked normal. Good partial response.       She had to be hospitalized a few  weeks ago because of DVT and pulmonary embolism. She is on Eliquis now. Dr. [name] recommends temporary vena cava filter in the perioperative period which is reasonable      Family history negative for breast or ovarian cancer. She is strongly motivated for breast conservation and I think we can try to do that Schedule for right breast lumpectomy with radioactive seed and right axillary deep sentinel lymph node biopsy The Port-A-Cath will be left in for the possibility of protocol chemotherapy Dr. [name] to arrange temporary vena cava filter placement preop Patient is to stop Eliquis 3 days preop and restart one day  postop   . She agrees with this plan  Operative Findings:       Lumpectomy was performed removing the marker clip and radioactive seed in the right breast at the 9 o'clock position.  The specimen mammogram looked good with the seed and marker clip in the center of the specimen.  There was no palpable abnormality in the breast.  In the right axilla I found only one sentinel lymph node.  Once that was removed there was no blue dye or radioactivity whatsoever.  Procedure in Detail:          Following the induction of general endotracheal anesthesia a timeout was performed.  Intravenous antibiotics were given.  Following alcohol prep I injected 5 cc of dilute methylene blue into the right breast and massaged the breast for a few minutes.    The right breast chest wall and axilla were prepped and draped in a sterile fashion.  0.5% Marcaine with epinephrine was used as a local infiltration anesthetic.  The patient had undergone pectoral block by the anesthesiologist preop I listened with the neoprobe and chose to make a radially oriented incision in the right breast at the 9 o'clock position.  The lumpectomy  was performed using the neoprobe and electrocautery.  I was very generous with the lumpectomy because of the initial size of her cancer.  Specimen was removed.  Specimen mammogram looked  good as mentioned above.  The specimen was sent to pathology where the seed was retrieved.  Hemostasis was excellent.  The wound was irrigated.  I placed 5 metal marker clips into the walls of the lumpectomy cavity for future reference.  The breast tissues were closed in layers with interrupted sutures of 3-0 Vicryl and the skin closed with a running subcuticular 4-0 Monocryl and Dermabond.     I then made a transverse incision in the right axilla at the hairline.  Dissection was carried down through the subcutaneous tissue.  Incised the clavipectoral fascia.  Using the neoprobe I mapped out and identified and removed once into the lymph node.  That was sent separately to pathology.  After this was done there was no more radioactivity or blue dye.  The wound was irrigated.  Hemostasis was excellent.  The clavipectoral fascia was closed with 3-0 Vicryl sutures and the skin closed with a running subcuticular 4-0 Monocryl and Dermabond.  Dry bandages and a breast binder were placed.  The patient tolerated the procedure well and was taken to PACU in stable condition.  EBL 20 cc.  Counts correct.  Complications none.   Addendum: I logged on to the [employer] and reviewed her prescription medication history   Wilbern Pennypacker M. Dalbert Batman, M.D., FACS General and Minimally Invasive Surgery Breast and Colorectal Surgery  07/05/2018 12:22 PM

## 2018-07-05 NOTE — Anesthesia Procedure Notes (Signed)
Anesthesia Regional Block: Pectoralis block   Pre-Anesthetic Checklist: ,, timeout performed, Correct Patient, Correct Site, Correct Laterality, Correct Procedure, Correct Position, site marked, Risks and benefits discussed,  Surgical consent,  Pre-op evaluation,  At surgeon's request and post-op pain management  Laterality: Left  Prep: chloraprep       Needles:  Injection technique: Single-shot  Needle Type: Echogenic Stimulator Needle     Needle Length: 5cm  Needle Gauge: 22     Additional Needles:   Procedures:, nerve stimulator,,, ultrasound used (permanent image in chart),,,,  Narrative:  Start time: 07/05/2018 10:35 AM End time: 07/05/2018 10:39 AM Injection made incrementally with aspirations every 5 mL.  Performed by: Personally  Anesthesiologist: Janeece Riggers, MD  Additional Notes: Functioning IV was confirmed and monitors were applied.  A 20mm 22ga Arrow echogenic stimulator needle was used. Sterile prep and drape,hand hygiene and sterile gloves were used. Ultrasound guidance: relevant anatomy identified, needle position confirmed, local anesthetic spread visualized around nerve(s)., vascular puncture avoided.  Image printed for medical record. Negative aspiration and negative test dose prior to incremental administration of local anesthetic. The patient tolerated the procedure well.

## 2018-07-05 NOTE — Transfer of Care (Signed)
Immediate Anesthesia Transfer of Care Note  Patient: Marcia [name]  Procedure(s) Performed: RIGHT BREAST LUMPECTOMY WITH RADIOACTIVE SEED AND SENTINEL LYMPH NODE BIOPSY,INJECT BLUE DYE RIGHT BREAST (Right Breast)  Patient Location: PACU  Anesthesia Type:General and Regional  Level of Consciousness: patient cooperative and responds to stimulation  Airway & Oxygen Therapy: Patient Spontanous Breathing and Patient connected to nasal cannula oxygen  Post-op Assessment: Report given to RN and Post -op Vital signs reviewed and stable  Post vital signs: Reviewed and stable  Last Vitals:  Vitals Value Taken Time  BP 130/77 07/05/2018 12:25 PM  Temp 36.3 C 07/05/2018 12:25 PM  Pulse 61 07/05/2018 12:27 PM  Resp 15 07/05/2018 12:27 PM  SpO2 100 % 07/05/2018 12:27 PM  Vitals shown include unvalidated device data.  Last Pain:  Vitals:   07/05/18 1225  TempSrc:   PainSc: (P) 0-No pain      Patients Stated Pain Goal: 4 (62/37/62 8315)  Complications: No apparent anesthesia complications

## 2018-07-05 NOTE — Interval H&P Note (Signed)
History and Physical Interval Note:  07/05/2018 10:25 AM  Marcia [name]  has presented today for surgery, with the diagnosis of RIGHT BREAST CANCER  The various methods of treatment have been discussed with the patient and family. After consideration of risks, benefits and other options for treatment, the patient has consented to  Procedure(s): RIGHT BREAST LUMPECTOMY WITH RADIOACTIVE SEED AND DEEP SENTINEL LYMPH NODE BIOPSY,INJECT BLUE DYE RIGHT BREAST (Right) as a surgical intervention .  The patient's history has been reviewed, patient examined, no change in status, stable for surgery.  I have reviewed the patient's chart and labs.  Questions were answered to the patient's satisfaction.     [name]

## 2018-07-06 ENCOUNTER — Encounter (HOSPITAL_COMMUNITY): Payer: Self-pay | Admitting: General Surgery

## 2018-07-06 NOTE — Progress Notes (Signed)
Inform patient of Pathology report,. Tell her the wonderful news. The breast lumpectomy showed no residual cancer.  She had a complete pathologic response to the chemotherapy The sentinel lymph node is also negative for cancer I will discuss this with her at her next visit Blood me know that he reached her  hmi

## 2018-07-12 ENCOUNTER — Other Ambulatory Visit: Payer: Self-pay | Admitting: Hematology & Oncology

## 2018-07-12 DIAGNOSIS — T50905A Adverse effect of unspecified drugs, medicaments and biological substances, initial encounter: Secondary | ICD-10-CM

## 2018-07-12 DIAGNOSIS — Z8673 Personal history of transient ischemic attack (TIA), and cerebral infarction without residual deficits: Secondary | ICD-10-CM

## 2018-07-12 DIAGNOSIS — D6959 Other secondary thrombocytopenia: Secondary | ICD-10-CM

## 2018-07-12 DIAGNOSIS — D649 Anemia, unspecified: Secondary | ICD-10-CM

## 2018-07-28 ENCOUNTER — Encounter: Payer: Self-pay | Admitting: Hematology & Oncology

## 2018-07-28 ENCOUNTER — Other Ambulatory Visit: Payer: Self-pay | Admitting: *Deleted

## 2018-07-28 ENCOUNTER — Inpatient Hospital Stay: Payer: 59

## 2018-07-28 ENCOUNTER — Inpatient Hospital Stay: Payer: 59 | Attending: Hematology & Oncology | Admitting: Hematology & Oncology

## 2018-07-28 ENCOUNTER — Other Ambulatory Visit: Payer: Self-pay

## 2018-07-28 VITALS — BP 118/62 | HR 80 | Temp 97.6°F | Wt 193.0 lb

## 2018-07-28 DIAGNOSIS — Z7901 Long term (current) use of anticoagulants: Secondary | ICD-10-CM | POA: Diagnosis not present

## 2018-07-28 DIAGNOSIS — Z86718 Personal history of other venous thrombosis and embolism: Secondary | ICD-10-CM | POA: Diagnosis not present

## 2018-07-28 DIAGNOSIS — Z9221 Personal history of antineoplastic chemotherapy: Secondary | ICD-10-CM | POA: Diagnosis not present

## 2018-07-28 DIAGNOSIS — C50411 Malignant neoplasm of upper-outer quadrant of right female breast: Secondary | ICD-10-CM

## 2018-07-28 DIAGNOSIS — C50911 Malignant neoplasm of unspecified site of right female breast: Secondary | ICD-10-CM

## 2018-07-28 DIAGNOSIS — Z171 Estrogen receptor negative status [ER-]: Secondary | ICD-10-CM | POA: Diagnosis not present

## 2018-07-28 DIAGNOSIS — Z9484 Stem cells transplant status: Secondary | ICD-10-CM | POA: Diagnosis not present

## 2018-07-28 DIAGNOSIS — C9001 Multiple myeloma in remission: Secondary | ICD-10-CM

## 2018-07-28 DIAGNOSIS — N289 Disorder of kidney and ureter, unspecified: Secondary | ICD-10-CM | POA: Insufficient documentation

## 2018-07-28 DIAGNOSIS — I2699 Other pulmonary embolism without acute cor pulmonale: Secondary | ICD-10-CM | POA: Diagnosis not present

## 2018-07-28 DIAGNOSIS — C9000 Multiple myeloma not having achieved remission: Secondary | ICD-10-CM | POA: Diagnosis not present

## 2018-07-28 DIAGNOSIS — I2601 Septic pulmonary embolism with acute cor pulmonale: Secondary | ICD-10-CM

## 2018-07-28 DIAGNOSIS — Z86711 Personal history of pulmonary embolism: Secondary | ICD-10-CM

## 2018-07-28 DIAGNOSIS — I10 Essential (primary) hypertension: Secondary | ICD-10-CM

## 2018-07-28 LAB — CBC WITH DIFFERENTIAL (CANCER CENTER ONLY)
BASOS PCT: 0 %
Basophils Absolute: 0 10*3/uL (ref 0.0–0.1)
EOS ABS: 0.3 10*3/uL (ref 0.0–0.5)
Eosinophils Relative: 4 %
HCT: 38.1 % (ref 34.8–46.6)
HEMOGLOBIN: 12.9 g/dL (ref 11.6–15.9)
LYMPHS ABS: 3.3 10*3/uL (ref 0.9–3.3)
Lymphocytes Relative: 47 %
MCH: 34.6 pg — AB (ref 26.0–34.0)
MCHC: 33.9 g/dL (ref 32.0–36.0)
MCV: 102.1 fL — ABNORMAL HIGH (ref 81.0–101.0)
Monocytes Absolute: 0.6 10*3/uL (ref 0.1–0.9)
Monocytes Relative: 8 %
NEUTROS PCT: 41 %
Neutro Abs: 2.9 10*3/uL (ref 1.5–6.5)
Platelet Count: 194 10*3/uL (ref 145–400)
RBC: 3.73 MIL/uL (ref 3.70–5.32)
RDW: 15 % (ref 11.1–15.7)
WBC: 7 10*3/uL (ref 3.9–10.0)

## 2018-07-28 LAB — CMP (CANCER CENTER ONLY)
ALBUMIN: 3.5 g/dL (ref 3.5–5.0)
ALK PHOS: 63 U/L (ref 26–84)
ALT: 20 U/L (ref 10–47)
AST: 20 U/L (ref 11–38)
Anion gap: 9 (ref 5–15)
BUN: 24 mg/dL — AB (ref 7–22)
CALCIUM: 9.1 mg/dL (ref 8.0–10.3)
CO2: 26 mmol/L (ref 18–33)
CREATININE: 1.3 mg/dL — AB (ref 0.60–1.20)
Chloride: 105 mmol/L (ref 98–108)
Glucose, Bld: 96 mg/dL (ref 73–118)
Potassium: 3.9 mmol/L (ref 3.3–4.7)
Sodium: 140 mmol/L (ref 128–145)
TOTAL PROTEIN: 6.7 g/dL (ref 6.4–8.1)
Total Bilirubin: 0.6 mg/dL (ref 0.2–1.6)

## 2018-07-28 LAB — LACTATE DEHYDROGENASE: LDH: 156 U/L (ref 98–192)

## 2018-07-28 MED ORDER — SODIUM CHLORIDE 0.9% FLUSH
10.0000 mL | INTRAVENOUS | Status: DC | PRN
  Administered 2018-07-28: 10 mL via INTRAVENOUS
  Filled 2018-07-28: qty 10

## 2018-07-28 MED ORDER — SODIUM CITRATE 4 % IV SOSY
5.0000 mL | PREFILLED_SYRINGE | Freq: Once | INTRAVENOUS | Status: DC
  Administered 2018-07-28: 5 mL via INTRAVENOUS

## 2018-07-28 MED ORDER — SODIUM CITRATE LOCK FLUSH 120 MG/3ML(4%) IV SOSY: 5.0000 mL | PREFILLED_SYRINGE | Freq: Once | INTRAVENOUS | Status: DC

## 2018-07-28 MED ORDER — ANTICOAGULANT SODIUM CITRATE 4% (200MG/5ML) IV SOLN
5.0000 mL | Freq: Once | Status: AC
  Filled 2018-07-28: qty 5

## 2018-07-28 NOTE — Progress Notes (Signed)
Hematology and Oncology Follow Up Visit  Marcia [name] [id number] November 19, 1951 67 y.o. 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  Surprisingly enough, I actually saw her in the hospital because she had developed a pulmonary embolism and lower extremity DVTs.  She was admitted on May 14, 2018.  She had a CT angiogram done.  This showed by lateral pulmonary emboli.  She had a lower extremity Doppler which showed thrombus in both lower extremities.  She was started on heparin.  She was then transitioned over to Eliquis.  Other Eliquis would be a good idea for her given her history of renal insufficiency.  She really looks great.  She feels better.  She had a MRI of her breast post chemotherapy.  She had this on July 13.  Thankfully, looks like she had a very good response to treatment.  She had a residual 3 x 2 x 1 cm mass in the upper outer quadrant of the right breast.  There was no obvious adenopathy from what I could tell by the report.  I spoke with Dr. Fanny Skates.  He will see her and consider her for a lumpectomy.  She will definitely need postop radiation therapy.  In addition, if there is residual disease, she will need postop Kadcyla as per the recent randomized clinical trial that showed the benefit of Kadcyla versus Herceptin in the postop setting.  I think she will probably benefit from having a temporary filter placed prior to her surgery.  I think that a temporary IVC filter will help provide a little more certainty that any lower extremity thrombus will  not embolize to the lung.  Her appetite is doing okay.  She is becoming more active.  She has not had any fever.  Her renal function has been doing fairly well.  Overall, I would say that her performance status is ECOG 1.  Medications:  Allergies as of 07/28/2018      Reactions   Bactrim [sulfamethoxazole-trimethoprim] Other (See Comments)   ? Renal failure.   Heparin    HIT   Zofran [ondansetron] Shortness Of Breath   "felt like throat was closing"   Clarithromycin Diarrhea, Nausea And Vomiting   Latex Rash   Macrodantin [nitrofurantoin] Rash      Medication List        Accurate as of 07/28/18  9:36 AM. Always use your most recent med list.          acetaminophen 325 MG tablet Commonly known as:  TYLENOL Take 650 mg by mouth daily as needed for moderate pain or headache.   doxylamine (Sleep) 25 MG tablet Commonly known as:  UNISOM Take 25 mg by mouth at bedtime as needed for sleep.   ELIQUIS 5 MG Tabs tablet Generic drug:  apixaban TAKE 1 TABLET BY MOUTH TWICE A DAY   feeding supplement (ENSURE ENLIVE) Liqd Take 237 mLs by mouth 3 (three) times daily between meals.   folic acid 1 MG tablet Commonly known as:  FOLVITE Take 2 tablets (2 mg total) by mouth daily.   lidocaine-prilocaine cream Commonly known as:  EMLA Apply to  affected area once   LORazepam 0.5 MG tablet Commonly known as:  ATIVAN TAKE 1 TABLET BY MOUTH EVERY 6 HOURS AS NEEDED FOR NAUSEA/VOMITING       Allergies:  Allergies  Allergen Reactions  . Bactrim [Sulfamethoxazole-Trimethoprim] Other (See Comments)    ? Renal failure.  . Heparin     HIT  . Zofran [Ondansetron] Shortness Of Breath    "felt like throat was closing"  . Clarithromycin Diarrhea and Nausea And Vomiting  . Latex Rash  . Macrodantin [Nitrofurantoin] Rash    Past Medical History, Surgical history, Social history, and Family History were reviewed and updated.  Review of Systems: Review of Systems  Constitutional:  Negative.   HENT: Negative.   Eyes: Negative.   Respiratory: Negative.   Cardiovascular: Negative.   Gastrointestinal: Negative.   Genitourinary: Negative.  Negative for dysuria.  Musculoskeletal: Negative.   Skin: Negative.   Neurological: Negative.   Endo/Heme/Allergies: Negative.   Psychiatric/Behavioral: Negative.       Physical Exam:  weight is 193 lb (87.5 kg). Her oral temperature is 97.6 F (36.4 C). Her blood pressure is 118/62 and her pulse is 80. Her oxygen saturation is 100%.   Wt Readings from Last 3 Encounters:  07/28/18 193 lb (87.5 kg)  07/05/18 191 lb (86.6 kg)  06/30/18 191 lb 9.6 oz (86.9 kg)    Physical Exam  Constitutional: She is oriented to person, place, and time.  HENT:  Head: Normocephalic and atraumatic.  Mouth/Throat: Oropharynx is clear and moist.  Eyes: Pupils are equal, round, and reactive to light. EOM are normal.  Neck: Normal range of motion.  Cardiovascular: Normal rate, regular rhythm and normal heart sounds.  Pulmonary/Chest: Effort normal and breath sounds normal.  Abdominal: Soft. Bowel sounds are normal.  Musculoskeletal: Normal range of motion. She exhibits no edema, tenderness or deformity.  Lymphadenopathy:    She has no cervical adenopathy.  Neurological: She is alert and oriented to person, place, and time.  Skin: Skin is warm and dry. No rash noted. No erythema.  Psychiatric: She has a normal mood and affect. Her behavior is normal. Judgment and thought content normal.  Vitals reviewed.    Lab Results  Component Value Date   WBC 7.0 07/28/2018   HGB 12.9 07/28/2018   HCT 38.1 07/28/2018   MCV 102.1 (H) 07/28/2018   PLT 194 07/28/2018   Lab Results  Component Value Date   FERRITIN 820 (H) 05/17/2018   IRON 272 (H) 05/17/2018   TIBC 295 05/17/2018   UIBC 23 05/17/2018   IRONPCTSAT 92 (H) 05/17/2018   Lab Results  Component Value Date   RETICCTPCT 1.3 03/27/2011   RBC 3.73 07/28/2018   RETICCTABS 56.4  03/27/2011   Lab Results  Component Value Date   KPAFRELGTCHN 23.1 (H) 02/16/2018   LAMBDASER 15.8 02/16/2018   KAPLAMBRATIO 1.46 02/16/2018   Lab Results  Component Value Date   IGGSERUM 924 02/16/2018   IGA 222 02/16/2018   IGMSERUM 126 02/16/2018   Lab Results  Component Value Date   TOTALPROTELP 5.2 (L) 03/30/2018   ALBUMINELP 3.0 03/30/2018   A1GS 0.2 03/30/2018   A2GS 0.7 03/30/2018   BETS 0.8 03/30/2018   BETA2SER 0.4 07/02/2015   GAMS 0.6 03/30/2018   MSPIKE Not Observed 03/30/2018   SPEI Comment 03/30/2018     Chemistry      Component Value Date/Time   NA 140 07/28/2018 0830   NA 141 07/06/2017 0816   NA 141 01/05/2017  0956   K 3.9 07/28/2018 0830   K 3.6 07/06/2017 0816   K 4.3 01/05/2017 0956   CL 105 07/28/2018 0830   CL 101 07/06/2017 0816   CO2 26 07/28/2018 0830   CO2 28 07/06/2017 0816   CO2 23 01/05/2017 0956   BUN 24 (H) 07/28/2018 0830   BUN 28 (H) 07/06/2017 0816   BUN 22.4 01/05/2017 0956   CREATININE 1.30 (H) 07/28/2018 0830   CREATININE 1.6 (H) 07/06/2017 0816   CREATININE 1.4 (H) 01/05/2017 0956      Component Value Date/Time   CALCIUM 9.1 07/28/2018 0830   CALCIUM 9.1 07/06/2017 0816   CALCIUM 9.3 01/05/2017 0956   ALKPHOS 63 07/28/2018 0830   ALKPHOS 58 07/06/2017 0816   ALKPHOS 52 01/05/2017 0956   AST 20 07/28/2018 0830   AST 14 01/05/2017 0956   ALT 20 07/28/2018 0830   ALT 58 (H) 07/06/2017 0816   ALT 13 01/05/2017 0956   BILITOT 0.6 07/28/2018 0830   BILITOT 0.56 01/05/2017 0956      Impression and Plan: Ms. Hurtado is a very pleasant 67 yo caucasian female with past history of kappa light chain myeloma with stem cell transplant. She has now been diagnosed with locally advanced infiltrating ductal carcinoma of the right breast, ER(-)/PR(-)/HER-2(+), Ki67 of 75%.   Now she is completed neoadjuvant chemotherapy, she will have surgery.  The fact that she had the thromboembolic event is a little bit of a tenuous problem.   Again, we will see about having an IVC filter placed.  She will probably need another CT angiogram and lower extremity Dopplers in about 2 or 3 months.  I probably would keep her on the Eliquis for 2 years.  I think she will need Eliquis.  I suspect that the thromboembolic event was more likely from her underlying malignancy.  I would like to see her back in 6 weeks.  By then, she would have recovered from her surgery.  We can see what the surgical pathology looks like.   [name], MD 9/4/20199:36 AM

## 2018-07-28 NOTE — Patient Instructions (Signed)
Implanted Port Insertion, Care After °This sheet gives you information about how to care for yourself after your procedure. Your health care provider may also give you more specific instructions. If you have problems or questions, contact your health care provider. °What can I expect after the procedure? °After your procedure, it is common to have: °· Discomfort at the port insertion site. °· Bruising on the skin over the port. This should improve over 3-4 days. ° °Follow these instructions at home: °Port care °· After your port is placed, you will get a manufacturer's information card. The card has information about your port. Keep this card with you at all times. °· Take care of the port as told by your health care provider. Ask your health care provider if you or a family member can get training for taking care of the port at home. A home health care nurse may also take care of the port. °· Make sure to remember what type of port you have. °Incision care °· Follow instructions from your health care provider about how to take care of your port insertion site. Make sure you: °? Wash your hands with soap and water before you change your bandage (dressing). If soap and water are not available, use hand sanitizer. °? Change your dressing as told by your health care provider. °? Leave stitches (sutures), skin glue, or adhesive strips in place. These skin closures may need to stay in place for 2 weeks or longer. If adhesive strip edges start to loosen and curl up, you may trim the loose edges. Do not remove adhesive strips completely unless your health care provider tells you to do that. °· Check your port insertion site every day for signs of infection. Check for: °? More redness, swelling, or pain. °? More fluid or blood. °? Warmth. °? Pus or a bad smell. °General instructions °· Do not take baths, swim, or use a hot tub until your health care provider approves. °· Do not lift anything that is heavier than 10 lb (4.5  kg) for a week, or as told by your health care provider. °· Ask your health care provider when it is okay to: °? Return to work or school. °? Resume usual physical activities or sports. °· Do not drive for 24 hours if you were given a medicine to help you relax (sedative). °· Take over-the-counter and prescription medicines only as told by your health care provider. °· Wear a medical alert bracelet in case of an emergency. This will tell any health care providers that you have a port. °· Keep all follow-up visits as told by your health care provider. This is important. °Contact a health care provider if: °· You cannot flush your port with saline as directed, or you cannot draw blood from the port. °· You have a fever or chills. °· You have more redness, swelling, or pain around your port insertion site. °· You have more fluid or blood coming from your port insertion site. °· Your port insertion site feels warm to the touch. °· You have pus or a bad smell coming from the port insertion site. °Get help right away if: °· You have chest pain or shortness of breath. °· You have bleeding from your port that you cannot control. °Summary °· Take care of the port as told by your health care provider. °· Change your dressing as told by your health care provider. °· Keep all follow-up visits as told by your health care provider. °  This information is not intended to replace advice given to you by your health care provider. Make sure you discuss any questions you have with your health care provider. °Document Released: 08/31/2013 Document Revised: 10/01/2016 Document Reviewed: 10/01/2016 °[employer] Interactive Patient Education © 2017 [employer] ° °

## 2018-07-28 NOTE — Progress Notes (Signed)
DISCONTINUE ON PATHWAY REGIMEN - Breast     A cycle is every 21 days:     Pertuzumab      Pertuzumab      Trastuzumab      Trastuzumab      Carboplatin      Docetaxel   **Always confirm dose/schedule in your pharmacy ordering system**  REASON: Continuation Of Treatment PRIOR TREATMENT: [id number]: Docetaxel + Carboplatin + Trastuzumab + Pertuzumab (TCHP) q21 Days x 6 Cycles TREATMENT RESPONSE: Complete Response (CR)  START ON PATHWAY REGIMEN - Breast     A cycle is every 21 days:     Trastuzumab-xxxx   **Always confirm dose/schedule in your pharmacy ordering system**  Patient Characteristics: Post-Neoadjuvant Therapy and Resection, HER2 Positive, ER Negative/Unknown, No Residual Disease, Adjuvant Targeted Therapy After Neoadjuvant Chemo/Targeted Therapy Therapeutic Status: Post-Neoadjuvant Therapy and Resection ER Status: Negative (-) HER2 Status: Positive (+) PR Status: Negative (-) Residual Invasive Disease Post-Neoadjuvant Therapy<= No Intent of Therapy: Curative Intent, Discussed with Patient

## 2018-07-28 NOTE — Progress Notes (Signed)
refe

## 2018-07-29 ENCOUNTER — Other Ambulatory Visit (HOSPITAL_COMMUNITY): Payer: Self-pay | Admitting: Interventional Radiology

## 2018-07-29 DIAGNOSIS — Z95828 Presence of other vascular implants and grafts: Secondary | ICD-10-CM

## 2018-07-30 MED FILL — Anticoagulant Sodium Citrate Soln 4%: Qty: 5 | Status: AC

## 2018-08-02 ENCOUNTER — Ambulatory Visit (HOSPITAL_BASED_OUTPATIENT_CLINIC_OR_DEPARTMENT_OTHER)
Admission: RE | Admit: 2018-08-02 | Discharge: 2018-08-02 | Disposition: A | Discharge status: Home / Self Care | Payer: 59 | Source: Ambulatory Visit | Attending: Hematology & Oncology | Admitting: Hematology & Oncology

## 2018-08-02 DIAGNOSIS — I10 Essential (primary) hypertension: Secondary | ICD-10-CM | POA: Diagnosis not present

## 2018-08-02 DIAGNOSIS — Z8673 Personal history of transient ischemic attack (TIA), and cerebral infarction without residual deficits: Secondary | ICD-10-CM | POA: Diagnosis not present

## 2018-08-02 NOTE — Progress Notes (Signed)
  Echocardiogram 2D Echocardiogram has been performed.  Marcia [name] Marcia [name] 08/02/2018, 10:24 AM

## 2018-08-03 ENCOUNTER — Encounter: Payer: Self-pay | Admitting: *Deleted

## 2018-08-18 ENCOUNTER — Inpatient Hospital Stay: Payer: 59

## 2018-08-18 ENCOUNTER — Other Ambulatory Visit (HOSPITAL_BASED_OUTPATIENT_CLINIC_OR_DEPARTMENT_OTHER): Payer: 59

## 2018-08-18 ENCOUNTER — Ambulatory Visit (HOSPITAL_BASED_OUTPATIENT_CLINIC_OR_DEPARTMENT_OTHER)
Admission: RE | Admit: 2018-08-18 | Discharge: 2018-08-18 | Disposition: A | Discharge status: Home / Self Care | Payer: 59 | Source: Ambulatory Visit | Attending: Hematology & Oncology | Admitting: Hematology & Oncology

## 2018-08-18 ENCOUNTER — Inpatient Hospital Stay (HOSPITAL_BASED_OUTPATIENT_CLINIC_OR_DEPARTMENT_OTHER): Payer: 59 | Admitting: Hematology & Oncology

## 2018-08-18 ENCOUNTER — Encounter: Payer: Self-pay | Admitting: Hematology & Oncology

## 2018-08-18 ENCOUNTER — Other Ambulatory Visit: Payer: 59

## 2018-08-18 ENCOUNTER — Other Ambulatory Visit: Payer: Self-pay

## 2018-08-18 ENCOUNTER — Encounter (HOSPITAL_BASED_OUTPATIENT_CLINIC_OR_DEPARTMENT_OTHER): Payer: Self-pay

## 2018-08-18 VITALS — BP 119/78 | HR 74 | Temp 98.5°F | Resp 20 | Wt 197.1 lb

## 2018-08-18 DIAGNOSIS — C50911 Malignant neoplasm of unspecified site of right female breast: Secondary | ICD-10-CM | POA: Insufficient documentation

## 2018-08-18 DIAGNOSIS — I2601 Septic pulmonary embolism with acute cor pulmonale: Secondary | ICD-10-CM | POA: Diagnosis not present

## 2018-08-18 DIAGNOSIS — C50411 Malignant neoplasm of upper-outer quadrant of right female breast: Secondary | ICD-10-CM | POA: Diagnosis not present

## 2018-08-18 DIAGNOSIS — Z86718 Personal history of other venous thrombosis and embolism: Secondary | ICD-10-CM | POA: Diagnosis not present

## 2018-08-18 DIAGNOSIS — I82511 Chronic embolism and thrombosis of right femoral vein: Secondary | ICD-10-CM | POA: Insufficient documentation

## 2018-08-18 DIAGNOSIS — I82531 Chronic embolism and thrombosis of right popliteal vein: Secondary | ICD-10-CM | POA: Insufficient documentation

## 2018-08-18 DIAGNOSIS — R590 Localized enlarged lymph nodes: Secondary | ICD-10-CM | POA: Insufficient documentation

## 2018-08-18 DIAGNOSIS — I2699 Other pulmonary embolism without acute cor pulmonale: Secondary | ICD-10-CM | POA: Diagnosis not present

## 2018-08-18 DIAGNOSIS — C9001 Multiple myeloma in remission: Secondary | ICD-10-CM

## 2018-08-18 DIAGNOSIS — C9000 Multiple myeloma not having achieved remission: Secondary | ICD-10-CM | POA: Diagnosis not present

## 2018-08-18 DIAGNOSIS — Z9484 Stem cells transplant status: Secondary | ICD-10-CM

## 2018-08-18 DIAGNOSIS — I10 Essential (primary) hypertension: Secondary | ICD-10-CM

## 2018-08-18 DIAGNOSIS — Z7901 Long term (current) use of anticoagulants: Secondary | ICD-10-CM | POA: Diagnosis not present

## 2018-08-18 DIAGNOSIS — I82403 Acute embolism and thrombosis of unspecified deep veins of lower extremity, bilateral: Secondary | ICD-10-CM | POA: Diagnosis present

## 2018-08-18 DIAGNOSIS — Z171 Estrogen receptor negative status [ER-]: Secondary | ICD-10-CM

## 2018-08-18 DIAGNOSIS — Z9221 Personal history of antineoplastic chemotherapy: Secondary | ICD-10-CM

## 2018-08-18 LAB — CBC WITH DIFFERENTIAL (CANCER CENTER ONLY)
Basophils Absolute: 0 10*3/uL (ref 0.0–0.1)
Basophils Relative: 0 %
EOS PCT: 2 %
Eosinophils Absolute: 0.2 10*3/uL (ref 0.0–0.5)
HCT: 41.8 % (ref 34.8–46.6)
Hemoglobin: 13.7 g/dL (ref 11.6–15.9)
LYMPHS ABS: 4.5 10*3/uL — AB (ref 0.9–3.3)
LYMPHS PCT: 57 %
MCH: 33.7 pg (ref 26.0–34.0)
MCHC: 32.8 g/dL (ref 32.0–36.0)
MCV: 103 fL — AB (ref 81.0–101.0)
Monocytes Absolute: 0.5 10*3/uL (ref 0.1–0.9)
Monocytes Relative: 6 %
Neutro Abs: 2.8 10*3/uL (ref 1.5–6.5)
Neutrophils Relative %: 35 %
PLATELETS: 201 10*3/uL (ref 145–400)
RBC: 4.06 MIL/uL (ref 3.70–5.32)
RDW: 13.3 % (ref 11.1–15.7)
WBC: 7.8 10*3/uL (ref 3.9–10.0)

## 2018-08-18 LAB — CMP (CANCER CENTER ONLY)
ALT: 23 U/L (ref 10–47)
AST: 26 U/L (ref 11–38)
Albumin: 3.8 g/dL (ref 3.5–5.0)
Alkaline Phosphatase: 52 U/L (ref 26–84)
Anion gap: 2 — ABNORMAL LOW (ref 5–15)
BUN: 26 mg/dL — ABNORMAL HIGH (ref 7–22)
CHLORIDE: 107 mmol/L (ref 98–108)
CO2: 28 mmol/L (ref 18–33)
Calcium: 9.3 mg/dL (ref 8.0–10.3)
Creatinine: 1.5 mg/dL — ABNORMAL HIGH (ref 0.60–1.20)
GLUCOSE: 92 mg/dL (ref 73–118)
POTASSIUM: 4.1 mmol/L (ref 3.3–4.7)
SODIUM: 137 mmol/L (ref 128–145)
Total Bilirubin: 0.6 mg/dL (ref 0.2–1.6)
Total Protein: 6.7 g/dL (ref 6.4–8.1)

## 2018-08-18 LAB — LACTATE DEHYDROGENASE: LDH: 162 U/L (ref 98–192)

## 2018-08-18 MED ORDER — DIPHENHYDRAMINE HCL 25 MG PO CAPS
ORAL_CAPSULE | ORAL | Status: AC
  Filled 2018-08-18: qty 2

## 2018-08-18 MED ORDER — ACETAMINOPHEN 325 MG PO TABS
650.0000 mg | ORAL_TABLET | Freq: Once | ORAL | Status: AC
  Administered 2018-08-18: 650 mg via ORAL

## 2018-08-18 MED ORDER — SODIUM CHLORIDE 0.9% FLUSH
10.0000 mL | INTRAVENOUS | Status: DC | PRN
  Filled 2018-08-18: qty 10

## 2018-08-18 MED ORDER — ANTICOAGULANT SODIUM CITRATE 4% (200MG/5ML) IV SOLN
5.0000 mL | Freq: Once | Status: AC
  Administered 2018-08-18: 5 mL via INTRAVENOUS
  Filled 2018-08-18: qty 5

## 2018-08-18 MED ORDER — IOPAMIDOL (ISOVUE-370) INJECTION 76%
100.0000 mL | Freq: Once | INTRAVENOUS | Status: AC | PRN
  Administered 2018-08-18: 100 mL via INTRAVENOUS

## 2018-08-18 MED ORDER — DIPHENHYDRAMINE HCL 25 MG PO CAPS
50.0000 mg | ORAL_CAPSULE | Freq: Once | ORAL | Status: AC
  Administered 2018-08-18: 50 mg via ORAL

## 2018-08-18 MED ORDER — SODIUM CHLORIDE 0.9% FLUSH
10.0000 mL | INTRAVENOUS | Status: DC | PRN
  Administered 2018-08-18: 10 mL
  Filled 2018-08-18: qty 10

## 2018-08-18 MED ORDER — SODIUM CHLORIDE 0.9 % IV SOLN
Freq: Once | INTRAVENOUS | Status: AC
  Administered 2018-08-18: 14:00:00 via INTRAVENOUS
  Filled 2018-08-18: qty 250

## 2018-08-18 MED ORDER — TRASTUZUMAB CHEMO 150 MG IV SOLR
8.0000 mg/kg | Freq: Once | INTRAVENOUS | Status: AC
  Administered 2018-08-18: 693 mg via INTRAVENOUS
  Filled 2018-08-18: qty 33

## 2018-08-18 MED ORDER — ACETAMINOPHEN 325 MG PO TABS
ORAL_TABLET | ORAL | Status: AC
  Filled 2018-08-18: qty 2

## 2018-08-18 NOTE — Patient Instructions (Signed)
Trastuzumab injection for infusion What is this medicine? TRASTUZUMAB (tras TOO zoo mab) is a monoclonal antibody. It is used to treat breast cancer and stomach cancer. This medicine may be used for other purposes; ask your health care provider or pharmacist if you have questions. COMMON BRAND NAME(S): Herceptin What should I tell my health care provider before I take this medicine? They need to know if you have any of these conditions: -heart disease -heart failure -lung or breathing disease, like asthma -an unusual or allergic reaction to trastuzumab, benzyl alcohol, or other medications, foods, dyes, or preservatives -pregnant or trying to get pregnant -breast-feeding How should I use this medicine? This drug is given as an infusion into a vein. It is administered in a hospital or clinic by a specially trained health care professional. Talk to your pediatrician regarding the use of this medicine in children. This medicine is not approved for use in children. Overdosage: If you think you have taken too much of this medicine contact a poison control center or emergency room at once. NOTE: This medicine is only for you. Do not share this medicine with others. What if I miss a dose? It is important not to miss a dose. Call your doctor or health care professional if you are unable to keep an appointment. What may interact with this medicine? This medicine may interact with the following medications: -certain types of chemotherapy, such as daunorubicin, doxorubicin, epirubicin, and idarubicin This list may not describe all possible interactions. Give your health care provider a list of all the medicines, herbs, non-prescription drugs, or dietary supplements you use. Also tell them if you smoke, drink alcohol, or use illegal drugs. Some items may interact with your medicine. What should I watch for while using this medicine? Visit your doctor for checks on your progress. Report any side effects.  Continue your course of treatment even though you feel ill unless your doctor tells you to stop. Call your doctor or health care professional for advice if you get a fever, chills or sore throat, or other symptoms of a cold or flu. Do not treat yourself. Try to avoid being around people who are sick. You may experience fever, chills and shaking during your first infusion. These effects are usually mild and can be treated with other medicines. Report any side effects during the infusion to your health care professional. Fever and chills usually do not happen with later infusions. Do not become pregnant while taking this medicine or for 7 months after stopping it. Women should inform their doctor if they wish to become pregnant or think they might be pregnant. Women of child-bearing potential will need to have a negative pregnancy test before starting this medicine. There is a potential for serious side effects to an unborn child. Talk to your health care professional or pharmacist for more information. Do not breast-feed an infant while taking this medicine or for 7 months after stopping it. Women must use effective birth control with this medicine. What side effects may I notice from receiving this medicine? Side effects that you should report to your doctor or health care professional as soon as possible: -allergic reactions like skin rash, itching or hives, swelling of the face, lips, or tongue -chest pain or palpitations -cough -dizziness -feeling faint or lightheaded, falls -fever -general ill feeling or flu-like symptoms -signs of worsening heart failure like breathing problems; swelling in your legs and feet -unusually weak or tired Side effects that usually do not require medical   attention (report to your doctor or health care professional if they continue or are bothersome): -bone pain -changes in taste -diarrhea -joint pain -nausea/vomiting -weight loss This list may not describe all  possible side effects. Call your doctor for medical advice about side effects. You may report side effects to FDA at 1-800-FDA-1088. Where should I keep my medicine? This drug is given in a hospital or clinic and will not be stored at home. NOTE: This sheet is a summary. It may not cover all possible information. If you have questions about this medicine, talk to your doctor, pharmacist, or health care provider.  2018 Elsevier/Gold Standard (2016-11-04 14:37:52)  

## 2018-08-18 NOTE — Progress Notes (Addendum)
Hematology and Oncology Follow Up Visit  Marcia [name] [id number] 1951/04/24 67 y.o. 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 is here today for follow-up.   I am very impressed with how she is done.  She had her lumpectomy back in August.  The pathology report ([id number]) showed no residual carcinoma.  There is a complete therapeutic response.  The sentinel lymph node was negative.  I am incredibly impressed by this.  She has done so so well.  As such, we just have to give her Herceptin in the adjuvant setting.  She is seen radiation oncology up in [place].  She will start radiation therapy on October 3.  She will have 21 treatments.  She has had no problems with nausea or vomiting.  She is had no cough.  She is had no leg swelling.  She said her sister came in it into town for couple weeks.  They had a wonderful time.  They ate quite a bit because her sister apparently is a very good cook.  She had a CT angiogram done today.  This showed resolving thrombus in the lung.  She also had a Doppler of her legs.  This did not show any active thrombus in her legs bilaterally.  She still has a temporary IVC filter in.  She goes for an appointment with interventional radiology in a week or so to see about having this removed.  She had an echocardiogram done on 08/02/2018.  This showed a "normal LVEF."  No mention of the exact ejection fraction was given.  She is doing well on the Eliquis.  Again she has 2 years of treatment for  this.  Currently, her performance status is ECOG 1.  Medications:  Allergies as of 08/18/2018      Reactions   Bactrim [sulfamethoxazole-trimethoprim] Other (See Comments)   ? Renal failure.   Heparin    HIT   Zofran [ondansetron] Shortness Of Breath   "felt like throat was closing"   Clarithromycin Diarrhea, Nausea And Vomiting   Latex Rash   Macrodantin [nitrofurantoin] Rash      Medication List        Accurate as of 08/18/18  1:55 PM. Always use your most recent med list.          acetaminophen 325 MG tablet Commonly known as:  TYLENOL Take 650 mg by mouth daily as needed for moderate pain or headache.   doxylamine (Sleep) 25 MG tablet Commonly known as:  UNISOM Take 25 mg by mouth at bedtime as needed for sleep.   ELIQUIS 5 MG Tabs tablet Generic drug:  apixaban TAKE 1 TABLET BY MOUTH TWICE A DAY   feeding supplement (ENSURE ENLIVE) Liqd Take 237 mLs by mouth 3 (three) times daily between meals.   folic acid 1 MG tablet Commonly known as:  FOLVITE Take 2 tablets (2 mg total) by mouth daily.       Allergies:  Allergies  Allergen Reactions  . Bactrim [Sulfamethoxazole-Trimethoprim] Other (See Comments)    ? Renal failure.  . Heparin     HIT  .  Zofran [Ondansetron] Shortness Of Breath    "felt like throat was closing"  . Clarithromycin Diarrhea and Nausea And Vomiting  . Latex Rash  . Macrodantin [Nitrofurantoin] Rash    Past Medical History, Surgical history, Social history, and Family History were reviewed and updated.  Review of Systems: Review of Systems  Constitutional: Negative.   HENT: Negative.   Eyes: Negative.   Respiratory: Negative.   Cardiovascular: Negative.   Gastrointestinal: Negative.   Genitourinary: Negative.  Negative for dysuria.  Musculoskeletal: Negative.   Skin: Negative.   Neurological: Negative.   Endo/Heme/Allergies: Negative.   Psychiatric/Behavioral: Negative.       Physical Exam:  weight is 197 lb 1.9 oz  (89.4 kg). Her oral temperature is 98.5 F (36.9 C). Her blood pressure is 119/78 and her pulse is 74. Her respiration is 20 and oxygen saturation is 97%.   Wt Readings from Last 3 Encounters:  08/18/18 197 lb 1.9 oz (89.4 kg)  07/28/18 193 lb (87.5 kg)  07/05/18 191 lb (86.6 kg)    Physical Exam  Constitutional: She is oriented to person, place, and time.  Her breast exam shows left breast with no masses, edema or erythema.  There is no left axillary adenopathy.  Her right breast is contracted from surgery.  She has a well-healed lumpectomy at the 7 o'clock position.  She has no masses in the right breast.  The right axillary sentinel lymph node incision is also well-healed.  HENT:  Head: Normocephalic and atraumatic.  Mouth/Throat: Oropharynx is clear and moist.  Eyes: Pupils are equal, round, and reactive to light. EOM are normal.  Neck: Normal range of motion.  Cardiovascular: Normal rate, regular rhythm and normal heart sounds.  Pulmonary/Chest: Effort normal and breath sounds normal.  Abdominal: Soft. Bowel sounds are normal.  Musculoskeletal: Normal range of motion. She exhibits no edema, tenderness or deformity.  Lymphadenopathy:    She has no cervical adenopathy.  Neurological: She is alert and oriented to person, place, and time.  Skin: Skin is warm and dry. No rash noted. No erythema.  Psychiatric: She has a normal mood and affect. Her behavior is normal. Judgment and thought content normal.  Vitals reviewed.    Lab Results  Component Value Date   WBC 7.8 08/18/2018   HGB 13.7 08/18/2018   HCT 41.8 08/18/2018   MCV 103.0 (H) 08/18/2018   PLT 201 08/18/2018   Lab Results  Component Value Date   FERRITIN 820 (H) 05/17/2018   IRON 272 (H) 05/17/2018   TIBC 295 05/17/2018   UIBC 23 05/17/2018   IRONPCTSAT 92 (H) 05/17/2018   Lab Results  Component Value Date   RETICCTPCT 1.3 03/27/2011   RBC 4.06 08/18/2018   RETICCTABS 56.4 03/27/2011   Lab Results   Component Value Date   KPAFRELGTCHN 23.1 (H) 02/16/2018   LAMBDASER 15.8 02/16/2018   KAPLAMBRATIO 1.46 02/16/2018   Lab Results  Component Value Date   IGGSERUM 924 02/16/2018   IGA 222 02/16/2018   IGMSERUM 126 02/16/2018   Lab Results  Component Value Date   TOTALPROTELP 5.2 (L) 03/30/2018   ALBUMINELP 3.0 03/30/2018   A1GS 0.2 03/30/2018   A2GS 0.7 03/30/2018   BETS 0.8 03/30/2018   BETA2SER 0.4 07/02/2015   GAMS 0.6 03/30/2018   MSPIKE Not Observed 03/30/2018   SPEI Comment 03/30/2018     Chemistry      Component Value Date/Time   NA 137 08/18/2018 1224   NA 141 07/06/2017 0816  NA 141 01/05/2017 0956   K 4.1 08/18/2018 1224   K 3.6 07/06/2017 0816   K 4.3 01/05/2017 0956   CL 107 08/18/2018 1224   CL 101 07/06/2017 0816   CO2 28 08/18/2018 1224   CO2 28 07/06/2017 0816   CO2 23 01/05/2017 0956   BUN 26 (H) 08/18/2018 1224   BUN 28 (H) 07/06/2017 0816   BUN 22.4 01/05/2017 0956   CREATININE 1.50 (H) 08/18/2018 1224   CREATININE 1.6 (H) 07/06/2017 0816   CREATININE 1.4 (H) 01/05/2017 0956      Component Value Date/Time   CALCIUM 9.3 08/18/2018 1224   CALCIUM 9.1 07/06/2017 0816   CALCIUM 9.3 01/05/2017 0956   ALKPHOS 52 08/18/2018 1224   ALKPHOS 58 07/06/2017 0816   ALKPHOS 52 01/05/2017 0956   AST 26 08/18/2018 1224   AST 14 01/05/2017 0956   ALT 23 08/18/2018 1224   ALT 58 (H) 07/06/2017 0816   ALT 13 01/05/2017 0956   BILITOT 0.6 08/18/2018 1224   BILITOT 0.56 01/05/2017 0956      Impression and Plan: Ms. Boven is a very pleasant 67 yo caucasian female with past history of kappa light chain myeloma with stem cell transplant. She has now been diagnosed with locally advanced infiltrating ductal carcinoma of the right breast, ER(-)/PR(-)/HER-2(+), Ki67 of 75%.   Again, I am very much impressed with how well she is responded.  I think her outlook should be quite good.  We will give the Herceptin for 1 year.  She will be on Eliquis until  June 2021.  I will plan to see her back in another month for her Herceptin.  She will be into her radiation therapy at that time.  Maybe, she would have had the IVC filter removed.  [name], MD 9/25/20191:55 PM   ADDENDUM:  For the purpose of getting the IVC filter out, she can be off Eliquis for 2 days BEFORE the procedure and then re-start Eliquis day AFTER the IVC filter is removed.  [name], MD

## 2018-08-31 ENCOUNTER — Encounter: Payer: Self-pay | Admitting: *Deleted

## 2018-08-31 ENCOUNTER — Ambulatory Visit
Admission: RE | Admit: 2018-08-31 | Discharge: 2018-08-31 | Disposition: A | Discharge status: Home / Self Care | Payer: 59 | Source: Ambulatory Visit | Attending: Interventional Radiology | Admitting: Interventional Radiology

## 2018-08-31 DIAGNOSIS — Z95828 Presence of other vascular implants and grafts: Secondary | ICD-10-CM

## 2018-08-31 HISTORY — PX: IR RADIOLOGIST EVAL & MGMT: IMG5224

## 2018-08-31 NOTE — Progress Notes (Signed)
Patient ID: Marcia [name], female   DOB: 1951/07/06, 67 y.o.   MRN: [id number]         Chief Complaint: IVC filter  Referring Physician(s): Ennever  History of Present Illness: Marcia [name] is a 67 y.o. female with past medical history significant for multiple myeloma, chronic kidney disease, hypertension, TIA, right-sided breast cancer, DVT and pulmonary embolism who underwent prophylactic placement of a IVC filter on 06/29/2018 for the purposes of temporary caval interruption prior to impending breast cancer surgery.  Patient is recovered from her breast surgery and returns today for evaluation for potential IVC filter retrieval.  The patient is unaccompanied and serves as her own historian.  Patient has recently restarted her anticoagulation with Eliquis which per patient report, Dr. [name] plans on administering for at least the next 9 months.  She is tolerating this medicine well without incident.  Specifically, no nontarget bleeding or easy bruising.  Patient denies worsening lower extremity pain or edema.  No chest pain or shortness of breath.   Past Medical History:  Diagnosis Date  . Anemia   . Cancer [facility])    Multiple Myeloma  . Chronic kidney disease    d/t multiple myeloma  . Counseling regarding goals of care 02/02/2018  . DVT (deep venous thrombosis) (HCC)    right and left leg  . Hypertension   . 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 Primary cancer of right breast without evidence of regional lymph node metastasis (N0) ([place]) 02/01/2018  . Pulmonary embolism ([place])    lung  . TIA (transient ischemic attack) 05/09/2018   [name]    Past Surgical History:  Procedure Laterality Date  . ABDOMINAL HYSTERECTOMY     partial  . APPENDECTOMY    . BREAST LUMPECTOMY WITH RADIOACTIVE SEED AND SENTINEL LYMPH NODE BIOPSY Right 07/05/2018   Procedure: RIGHT BREAST LUMPECTOMY WITH RADIOACTIVE SEED AND SENTINEL LYMPH NODE BIOPSY,INJECT  BLUE DYE RIGHT BREAST;  Surgeon: Fanny Skates, MD;  Location: [place];  Service: General;  Laterality: Right;  . BREAST SURGERY     biopsy  . IR IVC FILTER PLMT / S&I /IMG GUID/MOD SED  06/29/2018  . IR RADIOLOGIST EVAL & MGMT  08/31/2018  . PORTACATH PLACEMENT Left 02/12/2018   Procedure: INSERTION PORT-A-CATH;  Surgeon: Fanny Skates, MD;  Location: [facility];  Service: General;  Laterality: Left;  . TUBAL LIGATION      Allergies: Bactrim [sulfamethoxazole-trimethoprim]; Heparin; Zofran [ondansetron]; Clarithromycin; Latex; and Macrodantin [nitrofurantoin]  Medications: Prior to Admission medications   Medication Sig Start Date End Date Taking? Authorizing Provider  acetaminophen (TYLENOL) 325 MG tablet Take 650 mg by mouth daily as needed for moderate pain or headache.   Yes [provider]  doxylamine, Sleep, (UNISOM) 25 MG tablet Take 25 mg by mouth at bedtime as needed for sleep.   Yes [provider]  ELIQUIS 5 MG TABS tablet TAKE 1 TABLET BY MOUTH TWICE A DAY 07/12/18  Yes Ennever, Rudell Cobb, MD  feeding supplement, ENSURE ENLIVE, (ENSURE ENLIVE) LIQD Take 237 mLs by mouth 3 (three) times daily between meals. Patient taking differently: Take 237 mLs by mouth 2 (two) times daily.  05/22/18  Yes Oretha Milch D, MD  folic acid (FOLVITE) 1 MG tablet Take 2 tablets (2 mg total) by mouth daily. 06/16/18  Yes [name], MD     Family History  Problem Relation Age of Onset  . COPD Mother   .  COPD Father     Social History   Socioeconomic History  . Marital status: Married    Spouse name: Not on file  . Number of children: Not on file  . Years of education: Not on file  . Highest education level: Not on file  Occupational History  . Not on file  Social Needs  . Financial resource strain: Not on file  . Food insecurity:    Worry: Not on file    Inability: Not on file  . Transportation needs:    Medical: Not on file    Non-medical: Not on file  Tobacco  Use  . Smoking status: Never Smoker  . Smokeless tobacco: Never Used  . Tobacco comment: never used product  Substance and Sexual Activity  . Alcohol use: Never    Alcohol/week: 0.0 standard drinks    Frequency: Never  . Drug use: Never  . Sexual activity: Not on file  Lifestyle  . Physical activity:    Days per week: Not on file    Minutes per session: Not on file  . Stress: Not on file  Relationships  . Social connections:    Talks on phone: Not on file    Gets together: Not on file    Attends religious service: Not on file    Active member of club or organization: Not on file    Attends meetings of clubs or organizations: Not on file    Relationship status: Not on file  Other Topics Concern  . Not on file  Social History Narrative  . Not on file    ECOG Status: 0 - Asymptomatic  Review of Systems: A 12 point ROS discussed and pertinent positives are indicated in the HPI above.  All other systems are negative.  Review of Systems  Constitutional: Negative for appetite change and fatigue.  Respiratory: Negative.  Negative for shortness of breath.   Cardiovascular: Negative.     Vital Signs: BP 115/72   Pulse 81   Temp 98.1 F (36.7 C) (Oral)   Resp 16   Ht _0  (1.499 m)   Wt 89.8 kg   SpO2 98%   BMI 39.99 kg/m   Physical Exam   Imaging: Ct Angio Chest Pe W Or Wo Contrast  Result Date: 08/18/2018 CLINICAL DATA:  History of multiple myeloma. Right breast cancer. Followup pulmonary embolus. EXAM: CT ANGIOGRAPHY CHEST WITH CONTRAST TECHNIQUE: Multidetector CT imaging of the chest was performed using the standard protocol during bolus administration of intravenous contrast. Multiplanar CT image reconstructions and MIPs were obtained to evaluate the vascular anatomy. CONTRAST:  160m ISOVUE-370 IOPAMIDOL (ISOVUE-370) INJECTION 76% COMPARISON:  05/14/2018 FINDINGS: Cardiovascular: Normal heart size. No pericardial effusion. The main pulmonary artery is patent. No  saddle emboli identified. No acute filling defects identified within the lobar or segmental pulmonary arteries to suggest new pulmonary emboli. Thin web like filling defect within the distal right pulmonary artery noted compatible with chronic/resolving PE, image 72/6 the right upper lobe segmental pulmonary artery filling defects appear resolved. Mediastinum/Nodes: The thyroid gland appears normal. Trachea is patent and midline. Normal appearance of the esophagus. No enlarged mediastinal or hilar lymph nodes. Lungs/Pleura: Calcified granuloma identified within the right upper lobe measures 6 mm. No airspace consolidation, atelectasis or pneumothorax. Upper Abdomen: No acute abnormality. Musculoskeletal: Spondylosis identified within the thoracic spine. Within the right axilla there is an enlarged lymph node which measures 2.3 cm. Review of the MIP images confirms the above findings. IMPRESSION: 1. No  acute findings.  No evidence for new pulmonary emboli. 2. Chronic/resolving filling defects within the distal right pulmonary artery with resolution of previous segmental filling defects in the right upper lobe. 3. Enlarged right axillary lymph node which may be related to patient's breast cancer. Electronically Signed   By: Kerby Moors M.D.   On: 08/18/2018 13:24   US Venous Img Lower Bilateral  Result Date: 08/18/2018 CLINICAL DATA:  History of bilateral lower extremity DVT and pulmonary embolism. EXAM: BILATERAL LOWER EXTREMITY VENOUS DOPPLER ULTRASOUND TECHNIQUE: Gray-scale sonography with graded compression, as well as color Doppler and duplex ultrasound were performed to evaluate the lower extremity deep venous systems from the level of the common femoral vein and including the common femoral, femoral, profunda femoral, popliteal and calf veins including the posterior tibial, peroneal and gastrocnemius veins when visible. The superficial great saphenous vein was also interrogated. Spectral Doppler was  utilized to evaluate flow at rest and with distal augmentation maneuvers in the common femoral, femoral and popliteal veins. COMPARISON:  Report from a prior lower extremity venous duplex ultrasound performed at [facility] on 05/15/2018 FINDINGS: RIGHT LOWER EXTREMITY Common Femoral Vein: Minimal echogenic thrombus versus web formation in the lumen of the right common femoral vein which is nonocclusive and non flow limiting. This is consistent with prior DVT and appears chronic. Saphenofemoral Junction: No evidence of thrombus. Normal compressibility and flow on color Doppler imaging. Profunda Femoral Vein: No evidence of thrombus. Normal compressibility and flow on color Doppler imaging. Femoral Vein: No evidence of thrombus. Normal compressibility, respiratory phasicity and response to augmentation. Popliteal Vein: Tiny amount of echogenic mural thrombus in the mid popliteal vein which is non flow limiting and consistent with chronic thrombus. Calf Veins: No evidence of thrombus. Normal compressibility and flow on color Doppler imaging. Superficial Great Saphenous Vein: No evidence of thrombus. Normal compressibility. Venous Reflux:  None. Other Findings: No evidence of superficial thrombophlebitis or abnormal fluid collection. LEFT LOWER EXTREMITY Common Femoral Vein: No evidence of thrombus. Normal compressibility, respiratory phasicity and response to augmentation. Saphenofemoral Junction: No evidence of thrombus. Normal compressibility and flow on color Doppler imaging. Profunda Femoral Vein: No evidence of thrombus. Normal compressibility and flow on color Doppler imaging. Femoral Vein: No evidence of thrombus. Normal compressibility, respiratory phasicity and response to augmentation. Popliteal Vein: Minimal amount of echogenic mural thrombus in the left popliteal vein which is non flow limiting. Calf Veins: No evidence of thrombus. Normal compressibility and flow on color Doppler imaging. Superficial Great  Saphenous Vein: No evidence of thrombus. Normal compressibility. Venous Reflux:  None. Other Findings: No evidence of superficial thrombophlebitis or abnormal fluid collection. IMPRESSION: No acute appearing thrombus is identified in either lower extremity. There is a minimal amount echogenic, chronic appearing thrombus in the right common femoral vein, right popliteal vein and left popliteal vein. All of the segments show good flow and no evidence of flow limitation by chronic thrombus. Electronically Signed   By: Aletta Edouard M.D.   On: 08/18/2018 12:20   Ir Radiologist Eval & Mgmt  Result Date: 08/31/2018 Please refer to notes tab for details about interventional procedure. (Op Note)   Labs:  CBC: Recent Labs    06/16/18 0820 06/29/18 0834 07/28/18 0830 08/18/18 1224  WBC 7.0 6.4 7.0 7.8  HGB 12.3 12.9 12.9 13.7  HCT 36.9 38.2 38.1 41.8  PLT 191 179 194 201    COAGS: Recent Labs    05/19/18 2003 05/20/18 0500 05/21/18 0414 06/29/18 0834 07/05/18 1000  INR  --   --   --  0.90 0.91  APTT 57* 63* 63*  --  26    BMP: Recent Labs    05/20/18 0500 05/21/18 0528 05/22/18 0500  06/16/18 0820 06/29/18 0834 07/28/18 0830 08/18/18 1224  NA 136 137 137   < > 143 143 140 137  K 2.9* 3.3* 3.4*   < > 3.2* 3.5 3.9 4.1  CL 103 105 104   < > 109* 109 105 107  CO2 _0 < > _1 GLUCOSE 110* 105* 98   < > 103 102* 96 92  BUN 30* 21 16   < > 14 22 24* 26*  CALCIUM 7.6* 7.8* 7.9*   < > 8.6 9.0 9.1 9.3  CREATININE 1.33* 1.25* 1.23*   < > 1.40* 1.36* 1.30* 1.50*  GFRNONAA 40* 43* 44*  --   --  39*  --   --   GFRAA 46* 50* 51*  --   --  45*  --   --    < > = values in this interval not displayed.    LIVER FUNCTION TESTS: Recent Labs    05/26/18 0915 06/16/18 0820 07/28/18 0830 08/18/18 1224  BILITOT 0.9 0.8 0.6 0.6  AST _2 ALT 42 _3 ALKPHOS 87* 61 63 52  PROT 5.9* 6.3* 6.7 6.7  ALBUMIN 3.5 3.7 3.5 3.8    TUMOR MARKERS: No results  for input(s): AFPTM, CEA, CA199, CHROMGRNA in the last 8760 hours.  Assessment and Plan:  SAMEKA BAGENT is a 67 y.o. female with past medical history significant for multiple myeloma, chronic kidney disease, hypertension, TIA, right-sided breast cancer, DVT and pulmonary embolism who underwent prophylactic placement of a IVC filter on 06/29/2018 for the purposes of temporary caval interruption prior to impending breast cancer surgery.  The patient  has recovered from her breast surgery and returns today for evaluation for potential IVC filter retrieval.    Chest CTA performed 08/18/2018 demonstrates a small intimal web within the right interlobar pulmonary artery while bilateral lower extremity venous Doppler ultrasound demonstrates a minimal amount of nonocclusive wall thickening/chronic DVT within the right common femoral and bilateral popliteal veins.  Patient is currently asymptomatic, specifically, no lower extremity pain or edema.  No chest pain or shortness of breath.  As the patient is currently tolerating her anticoagulation well without incident and has recovered fully from her breast surgery I feel she is an excellent candidate for image guided IVC filter retrieval.    Risks and benefits of IVC filter retrieval was discussed with the patient including, but not limited to bleeding, infection, filter fracture or migration, strut penetration with damage or irritation to adjacent structures and caval thrombosis.  All of the patient's questions were answered, patient is agreeable to proceed.  PLAN: - Per patient request, the IVC filter will be scheduled as an outpatient basis at [facility].  As there is no urgency for this procedure, the patient wishes to coordinate this procedure to be performed in the near future when I am at that facility. - Patient has history of chronic renal insufficiency and as such creatinine level will be obtained at the time of the procedure.  If patient's  kidney function remains borderline, the filter retrieval will be performed with CO2 as was done at the time of filter placement. - The patient will stop her Eliquis medication 48 hours prior  to the procedure. - Per patient request, conscious sedation medicine will be administered via her existing portacatheter.  Patient knows to call the interventional radiology clinic with any interval questions or concerns.  Thank you for this interesting consult.  I greatly enjoyed meeting SKARLETTE LATTNER and look forward to participating in their care.  A copy of this report was sent to the requesting provider on this date.  Electronically Signed: Sandi Mariscal 08/31/2018, 11:42 AM   I spent a total of 15 Minutes in face to face in clinical consultation, greater than 50% of which was counseling/coordinating care for IVC filter retrieval

## 2018-09-06 ENCOUNTER — Telehealth (HOSPITAL_COMMUNITY): Payer: Self-pay

## 2018-09-06 NOTE — Telephone Encounter (Signed)
Called to schedule ivc filter retrieval, no answer, left vm. AW 

## 2018-09-08 ENCOUNTER — Inpatient Hospital Stay: Payer: 59

## 2018-09-08 ENCOUNTER — Other Ambulatory Visit: Payer: Self-pay

## 2018-09-08 ENCOUNTER — Inpatient Hospital Stay: Payer: 59 | Attending: Hematology & Oncology

## 2018-09-08 DIAGNOSIS — Z23 Encounter for immunization: Secondary | ICD-10-CM | POA: Diagnosis not present

## 2018-09-08 DIAGNOSIS — C50911 Malignant neoplasm of unspecified site of right female breast: Secondary | ICD-10-CM

## 2018-09-08 DIAGNOSIS — Z5112 Encounter for antineoplastic immunotherapy: Secondary | ICD-10-CM | POA: Diagnosis not present

## 2018-09-08 DIAGNOSIS — C9000 Multiple myeloma not having achieved remission: Secondary | ICD-10-CM | POA: Diagnosis not present

## 2018-09-08 DIAGNOSIS — C50411 Malignant neoplasm of upper-outer quadrant of right female breast: Secondary | ICD-10-CM | POA: Diagnosis present

## 2018-09-08 LAB — CMP (CANCER CENTER ONLY)
ALBUMIN: 3.9 g/dL (ref 3.5–5.0)
ALK PHOS: 60 U/L (ref 26–84)
ALT: 20 U/L (ref 10–47)
AST: 23 U/L (ref 11–38)
Anion gap: 1 — ABNORMAL LOW (ref 5–15)
BILIRUBIN TOTAL: 0.7 mg/dL (ref 0.2–1.6)
BUN: 26 mg/dL — AB (ref 7–22)
CALCIUM: 9.5 mg/dL (ref 8.0–10.3)
CO2: 28 mmol/L (ref 18–33)
CREATININE: 1.6 mg/dL — AB (ref 0.60–1.20)
Chloride: 109 mmol/L — ABNORMAL HIGH (ref 98–108)
Glucose, Bld: 95 mg/dL (ref 73–118)
Potassium: 4.1 mmol/L (ref 3.3–4.7)
SODIUM: 138 mmol/L (ref 128–145)
TOTAL PROTEIN: 6.8 g/dL (ref 6.4–8.1)

## 2018-09-08 LAB — CBC WITH DIFFERENTIAL (CANCER CENTER ONLY)
Abs Immature Granulocytes: 0.01 10*3/uL (ref 0.00–0.07)
Basophils Absolute: 0 10*3/uL (ref 0.0–0.1)
Basophils Relative: 0 %
EOS PCT: 2 %
Eosinophils Absolute: 0.1 10*3/uL (ref 0.0–0.5)
HEMATOCRIT: 41.4 % (ref 36.0–46.0)
HEMOGLOBIN: 13.4 g/dL (ref 12.0–15.0)
Immature Granulocytes: 0 %
Lymphocytes Relative: 50 %
Lymphs Abs: 3.2 10*3/uL (ref 0.7–4.0)
MCH: 32.6 pg (ref 26.0–34.0)
MCHC: 32.4 g/dL (ref 30.0–36.0)
MCV: 100.7 fL — AB (ref 80.0–100.0)
MONO ABS: 0.4 10*3/uL (ref 0.1–1.0)
MONOS PCT: 6 %
Neutro Abs: 2.7 10*3/uL (ref 1.7–7.7)
Neutrophils Relative %: 42 %
Platelet Count: 191 10*3/uL (ref 150–400)
RBC: 4.11 MIL/uL (ref 3.87–5.11)
RDW: 12.4 % (ref 11.5–15.5)
WBC: 6.3 10*3/uL (ref 4.0–10.5)
nRBC: 0 % (ref 0.0–0.2)

## 2018-09-08 MED ORDER — LIDOCAINE 5 % EX PTCH: 1.0000 | MEDICATED_PATCH | CUTANEOUS | 0 refills | Status: DC

## 2018-09-08 MED ORDER — DIPHENHYDRAMINE HCL 25 MG PO CAPS
ORAL_CAPSULE | ORAL | Status: AC
  Filled 2018-09-08: qty 2

## 2018-09-08 MED ORDER — ACETAMINOPHEN 325 MG PO TABS
650.0000 mg | ORAL_TABLET | Freq: Once | ORAL | Status: AC
  Administered 2018-09-08: 650 mg via ORAL

## 2018-09-08 MED ORDER — INFLUENZA VAC SPLIT QUAD 0.5 ML IM SUSY
0.5000 mL | PREFILLED_SYRINGE | Freq: Once | INTRAMUSCULAR | Status: AC
  Administered 2018-09-08: 0.5 mL via INTRAMUSCULAR

## 2018-09-08 MED ORDER — DIPHENHYDRAMINE HCL 25 MG PO CAPS
50.0000 mg | ORAL_CAPSULE | Freq: Once | ORAL | Status: AC
  Administered 2018-09-08: 50 mg via ORAL

## 2018-09-08 MED ORDER — TRASTUZUMAB CHEMO 150 MG IV SOLR
6.0000 mg/kg | Freq: Once | INTRAVENOUS | Status: AC
  Administered 2018-09-08: 525 mg via INTRAVENOUS
  Filled 2018-09-08: qty 25

## 2018-09-08 MED ORDER — SODIUM CHLORIDE 0.9 % IV SOLN
Freq: Once | INTRAVENOUS | Status: AC
  Administered 2018-09-08: 10:00:00 via INTRAVENOUS
  Filled 2018-09-08: qty 250

## 2018-09-08 MED ORDER — SODIUM CHLORIDE 0.9% FLUSH
10.0000 mL | INTRAVENOUS | Status: DC | PRN
  Administered 2018-09-08: 10 mL
  Filled 2018-09-08: qty 10

## 2018-09-08 MED ORDER — ACETAMINOPHEN 325 MG PO TABS
ORAL_TABLET | ORAL | Status: AC
  Filled 2018-09-08: qty 2

## 2018-09-08 MED ORDER — INFLUENZA VAC SPLIT QUAD 0.5 ML IM SUSY
PREFILLED_SYRINGE | INTRAMUSCULAR | Status: AC
  Filled 2018-09-08: qty 0.5

## 2018-09-08 NOTE — Patient Instructions (Signed)
Trastuzumab injection for infusion What is this medicine? TRASTUZUMAB (tras TOO zoo mab) is a monoclonal antibody. It is used to treat breast cancer and stomach cancer. This medicine may be used for other purposes; ask your health care provider or pharmacist if you have questions. COMMON BRAND NAME(S): Herceptin What should I tell my health care provider before I take this medicine? They need to know if you have any of these conditions: -heart disease -heart failure -lung or breathing disease, like asthma -an unusual or allergic reaction to trastuzumab, benzyl alcohol, or other medications, foods, dyes, or preservatives -pregnant or trying to get pregnant -breast-feeding How should I use this medicine? This drug is given as an infusion into a vein. It is administered in a hospital or clinic by a specially trained health care professional. Talk to your pediatrician regarding the use of this medicine in children. This medicine is not approved for use in children. Overdosage: If you think you have taken too much of this medicine contact a poison control center or emergency room at once. NOTE: This medicine is only for you. Do not share this medicine with others. What if I miss a dose? It is important not to miss a dose. Call your doctor or health care professional if you are unable to keep an appointment. What may interact with this medicine? This medicine may interact with the following medications: -certain types of chemotherapy, such as daunorubicin, doxorubicin, epirubicin, and idarubicin This list may not describe all possible interactions. Give your health care provider a list of all the medicines, herbs, non-prescription drugs, or dietary supplements you use. Also tell them if you smoke, drink alcohol, or use illegal drugs. Some items may interact with your medicine. What should I watch for while using this medicine? Visit your doctor for checks on your progress. Report any side effects.  Continue your course of treatment even though you feel ill unless your doctor tells you to stop. Call your doctor or health care professional for advice if you get a fever, chills or sore throat, or other symptoms of a cold or flu. Do not treat yourself. Try to avoid being around people who are sick. You may experience fever, chills and shaking during your first infusion. These effects are usually mild and can be treated with other medicines. Report any side effects during the infusion to your health care professional. Fever and chills usually do not happen with later infusions. Do not become pregnant while taking this medicine or for 7 months after stopping it. Women should inform their doctor if they wish to become pregnant or think they might be pregnant. Women of child-bearing potential will need to have a negative pregnancy test before starting this medicine. There is a potential for serious side effects to an unborn child. Talk to your health care professional or pharmacist for more information. Do not breast-feed an infant while taking this medicine or for 7 months after stopping it. Women must use effective birth control with this medicine. What side effects may I notice from receiving this medicine? Side effects that you should report to your doctor or health care professional as soon as possible: -allergic reactions like skin rash, itching or hives, swelling of the face, lips, or tongue -chest pain or palpitations -cough -dizziness -feeling faint or lightheaded, falls -fever -general ill feeling or flu-like symptoms -signs of worsening heart failure like breathing problems; swelling in your legs and feet -unusually weak or tired Side effects that usually do not require medical   attention (report to your doctor or health care professional if they continue or are bothersome): -bone pain -changes in taste -diarrhea -joint pain -nausea/vomiting -weight loss This list may not describe all  possible side effects. Call your doctor for medical advice about side effects. You may report side effects to FDA at 1-800-FDA-1088. Where should I keep my medicine? This drug is given in a hospital or clinic and will not be stored at home. NOTE: This sheet is a summary. It may not cover all possible information. If you have questions about this medicine, talk to your doctor, pharmacist, or health care provider.  2018 Elsevier/Gold Standard (2016-11-04 14:37:52)  

## 2018-09-08 NOTE — Patient Instructions (Signed)
Implanted Port Home Guide An implanted port is a type of central line that is placed under the skin. Central lines are used to provide IV access when treatment or nutrition needs to be given through a person's veins. Implanted ports are used for long-term IV access. An implanted port may be placed because:  You need IV medicine that would be irritating to the small veins in your hands or arms.  You need long-term IV medicines, such as antibiotics.  You need IV nutrition for a long period.  You need frequent blood draws for lab tests.  You need dialysis.  Implanted ports are usually placed in the chest area, but they can also be placed in the upper arm, the abdomen, or the leg. An implanted port has two main parts:  Reservoir. The reservoir is round and will appear as a small, raised area under your skin. The reservoir is the part where a needle is inserted to give medicines or draw blood.  Catheter. The catheter is a thin, flexible tube that extends from the reservoir. The catheter is placed into a large vein. Medicine that is inserted into the reservoir goes into the catheter and then into the vein.  How will I care for my incision site? Do not get the incision site wet. Bathe or shower as directed by your health care provider. How is my port accessed? Special steps must be taken to access the port:  Before the port is accessed, a numbing cream can be placed on the skin. This helps numb the skin over the port site.  Your health care provider uses a sterile technique to access the port. ? Your health care provider must put on a mask and sterile gloves. ? The skin over your port is cleaned carefully with an antiseptic and allowed to dry. ? The port is gently pinched between sterile gloves, and a needle is inserted into the port.  Only "non-coring" port needles should be used to access the port. Once the port is accessed, a blood return should be checked. This helps ensure that the port  is in the vein and is not clogged.  If your port needs to remain accessed for a constant infusion, a clear (transparent) bandage will be placed over the needle site. The bandage and needle will need to be changed every week, or as directed by your health care provider.  Keep the bandage covering the needle clean and dry. Do not get it wet. Follow your health care provider's instructions on how to take a shower or bath while the port is accessed.  If your port does not need to stay accessed, no bandage is needed over the port.  What is flushing? Flushing helps keep the port from getting clogged. Follow your health care provider's instructions on how and when to flush the port. Ports are usually flushed with saline solution or a medicine called heparin. The need for flushing will depend on how the port is used.  If the port is used for intermittent medicines or blood draws, the port will need to be flushed: ? After medicines have been given. ? After blood has been drawn. ? As part of routine maintenance.  If a constant infusion is running, the port may not need to be flushed.  How long will my port stay implanted? The port can stay in for as long as your health care provider thinks it is needed. When it is time for the port to come out, surgery will be   done to remove it. The procedure is similar to the one performed when the port was put in. When should I seek immediate medical care? When you have an implanted port, you should seek immediate medical care if:  You notice a bad smell coming from the incision site.  You have swelling, redness, or drainage at the incision site.  You have more swelling or pain at the port site or the surrounding area.  You have a fever that is not controlled with medicine.  This information is not intended to replace advice given to you by your health care provider. Make sure you discuss any questions you have with your health care provider. Document  Released: 11/10/2005 Document Revised: 04/17/2016 Document Reviewed: 07/18/2013 Elsevier Interactive Patient Education  2017 [employer]  

## 2018-09-14 ENCOUNTER — Telehealth: Payer: Self-pay | Admitting: Hematology & Oncology

## 2018-09-14 NOTE — Telephone Encounter (Signed)
Faxed medical records to: [facility], RN CANCER [facility] F: [phone] P: [phone] [id number]    ID: [id number]   COPY SCANNED

## 2018-09-15 ENCOUNTER — Other Ambulatory Visit: Payer: Self-pay | Admitting: Hematology & Oncology

## 2018-09-15 DIAGNOSIS — D649 Anemia, unspecified: Secondary | ICD-10-CM

## 2018-09-15 DIAGNOSIS — D6959 Other secondary thrombocytopenia: Secondary | ICD-10-CM

## 2018-09-15 DIAGNOSIS — Z8673 Personal history of transient ischemic attack (TIA), and cerebral infarction without residual deficits: Secondary | ICD-10-CM

## 2018-09-15 DIAGNOSIS — T50905A Adverse effect of unspecified drugs, medicaments and biological substances, initial encounter: Secondary | ICD-10-CM

## 2018-09-20 ENCOUNTER — Telehealth (HOSPITAL_COMMUNITY): Payer: Self-pay

## 2018-09-20 NOTE — Telephone Encounter (Signed)
Called to schedule ivc filter removal, no answer, left vm. AW  

## 2018-09-28 ENCOUNTER — Other Ambulatory Visit: Payer: Self-pay | Admitting: *Deleted

## 2018-09-28 DIAGNOSIS — C9001 Multiple myeloma in remission: Secondary | ICD-10-CM

## 2018-09-28 DIAGNOSIS — C50911 Malignant neoplasm of unspecified site of right female breast: Secondary | ICD-10-CM

## 2018-09-29 ENCOUNTER — Inpatient Hospital Stay: Payer: 59

## 2018-09-29 ENCOUNTER — Other Ambulatory Visit: Payer: Self-pay

## 2018-09-29 ENCOUNTER — Encounter: Payer: Self-pay | Admitting: Hematology & Oncology

## 2018-09-29 ENCOUNTER — Inpatient Hospital Stay: Payer: 59 | Attending: Hematology & Oncology | Admitting: Hematology & Oncology

## 2018-09-29 VITALS — BP 130/73 | HR 72 | Temp 98.2°F | Resp 0 | Wt 199.0 lb

## 2018-09-29 DIAGNOSIS — C9000 Multiple myeloma not having achieved remission: Secondary | ICD-10-CM | POA: Diagnosis not present

## 2018-09-29 DIAGNOSIS — C9001 Multiple myeloma in remission: Secondary | ICD-10-CM

## 2018-09-29 DIAGNOSIS — Z1501 Genetic susceptibility to malignant neoplasm of breast: Secondary | ICD-10-CM | POA: Insufficient documentation

## 2018-09-29 DIAGNOSIS — Z9484 Stem cells transplant status: Secondary | ICD-10-CM | POA: Insufficient documentation

## 2018-09-29 DIAGNOSIS — Z171 Estrogen receptor negative status [ER-]: Secondary | ICD-10-CM | POA: Insufficient documentation

## 2018-09-29 DIAGNOSIS — C50411 Malignant neoplasm of upper-outer quadrant of right female breast: Secondary | ICD-10-CM | POA: Insufficient documentation

## 2018-09-29 DIAGNOSIS — Z7901 Long term (current) use of anticoagulants: Secondary | ICD-10-CM | POA: Insufficient documentation

## 2018-09-29 DIAGNOSIS — Z86718 Personal history of other venous thrombosis and embolism: Secondary | ICD-10-CM | POA: Diagnosis not present

## 2018-09-29 DIAGNOSIS — Z9181 History of falling: Secondary | ICD-10-CM | POA: Diagnosis not present

## 2018-09-29 DIAGNOSIS — Z923 Personal history of irradiation: Secondary | ICD-10-CM | POA: Diagnosis not present

## 2018-09-29 DIAGNOSIS — Z5112 Encounter for antineoplastic immunotherapy: Secondary | ICD-10-CM | POA: Insufficient documentation

## 2018-09-29 DIAGNOSIS — Z9221 Personal history of antineoplastic chemotherapy: Secondary | ICD-10-CM | POA: Diagnosis not present

## 2018-09-29 DIAGNOSIS — C50911 Malignant neoplasm of unspecified site of right female breast: Secondary | ICD-10-CM

## 2018-09-29 LAB — CMP (CANCER CENTER ONLY)
ALT: 22 U/L (ref 10–47)
ANION GAP: 7 (ref 5–15)
AST: 23 U/L (ref 11–38)
Albumin: 3.9 g/dL (ref 3.5–5.0)
Alkaline Phosphatase: 53 U/L (ref 26–84)
BILIRUBIN TOTAL: 0.7 mg/dL (ref 0.2–1.6)
BUN: 31 mg/dL — ABNORMAL HIGH (ref 7–22)
CALCIUM: 9.2 mg/dL (ref 8.0–10.3)
CO2: 28 mmol/L (ref 18–33)
Chloride: 104 mmol/L (ref 98–108)
Creatinine: 1.6 mg/dL — ABNORMAL HIGH (ref 0.60–1.20)
GLUCOSE: 94 mg/dL (ref 73–118)
Potassium: 3.9 mmol/L (ref 3.3–4.7)
SODIUM: 139 mmol/L (ref 128–145)
TOTAL PROTEIN: 6.9 g/dL (ref 6.4–8.1)

## 2018-09-29 LAB — CBC WITH DIFFERENTIAL (CANCER CENTER ONLY)
Abs Immature Granulocytes: 0.02 10*3/uL (ref 0.00–0.07)
BASOS PCT: 0 %
Basophils Absolute: 0 10*3/uL (ref 0.0–0.1)
EOS ABS: 0.1 10*3/uL (ref 0.0–0.5)
EOS PCT: 2 %
HCT: 41.4 % (ref 36.0–46.0)
Hemoglobin: 13.3 g/dL (ref 12.0–15.0)
IMMATURE GRANULOCYTES: 0 %
Lymphocytes Relative: 28 %
Lymphs Abs: 1.4 10*3/uL (ref 0.7–4.0)
MCH: 32.2 pg (ref 26.0–34.0)
MCHC: 32.1 g/dL (ref 30.0–36.0)
MCV: 100.2 fL — AB (ref 80.0–100.0)
Monocytes Absolute: 0.4 10*3/uL (ref 0.1–1.0)
Monocytes Relative: 9 %
NEUTROS PCT: 61 %
NRBC: 0 % (ref 0.0–0.2)
Neutro Abs: 3 10*3/uL (ref 1.7–7.7)
PLATELETS: 142 10*3/uL — AB (ref 150–400)
RBC: 4.13 MIL/uL (ref 3.87–5.11)
RDW: 12.4 % (ref 11.5–15.5)
WBC Count: 4.9 10*3/uL (ref 4.0–10.5)

## 2018-09-29 MED ORDER — ANTICOAGULANT SODIUM CITRATE 4% (200MG/5ML) IV SOLN
5.0000 mL | Freq: Once | Status: AC
  Administered 2018-09-29: 5 mL via INTRAVENOUS
  Filled 2018-09-29: qty 5

## 2018-09-29 MED ORDER — DIPHENHYDRAMINE HCL 25 MG PO CAPS
50.0000 mg | ORAL_CAPSULE | Freq: Once | ORAL | Status: AC
  Administered 2018-09-29: 50 mg via ORAL

## 2018-09-29 MED ORDER — SODIUM CHLORIDE 0.9 % IV SOLN
Freq: Once | INTRAVENOUS | Status: AC
  Administered 2018-09-29: 13:00:00 via INTRAVENOUS
  Filled 2018-09-29: qty 250

## 2018-09-29 MED ORDER — ACETAMINOPHEN 325 MG PO TABS
ORAL_TABLET | ORAL | Status: AC
  Filled 2018-09-29: qty 2

## 2018-09-29 MED ORDER — ACETAMINOPHEN 325 MG PO TABS
650.0000 mg | ORAL_TABLET | Freq: Once | ORAL | Status: AC
  Administered 2018-09-29: 650 mg via ORAL

## 2018-09-29 MED ORDER — TRASTUZUMAB CHEMO 150 MG IV SOLR
6.0000 mg/kg | Freq: Once | INTRAVENOUS | Status: AC
  Administered 2018-09-29: 525 mg via INTRAVENOUS
  Filled 2018-09-29: qty 25

## 2018-09-29 MED ORDER — DIPHENHYDRAMINE HCL 25 MG PO CAPS
ORAL_CAPSULE | ORAL | Status: AC
  Filled 2018-09-29: qty 2

## 2018-09-29 MED ORDER — SODIUM CHLORIDE 0.9% FLUSH
10.0000 mL | INTRAVENOUS | Status: DC | PRN
  Administered 2018-09-29: 10 mL
  Filled 2018-09-29: qty 10

## 2018-09-29 NOTE — Progress Notes (Signed)
Hematology and Oncology Follow Up Visit  AYLINN RYDBERG [id number] 04/12/1951 67 y.o. 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 Dodgen is here today for follow-up.   So far, she is doing fantastic.  She only has 5 more radiation treatments left.  I suspect that this probably will be a booster to where her primary was.  She has had no problems with the Herceptin.  She did fall I think over the weekend.  She just tripped over her own 2 feet.  Thankfully, she did not sustain any damage.  Her next treatment will be the day before Thanksgiving.  I will move this back a week.  I do not want her coming all the way down here for treatment before the holiday.  She did not have the filter removed.  I am not sure why the radiologist did not do it.  Hopefully, this will get done in another couple months.    She is on Eliquis.  She has had no problems with this.  As far as her myeloma is concerned, we do not see any evidence of recurrence.  Overall, her performance status is ECOG 1.     Medications:  Allergies as of 09/29/2018      Reactions   Bactrim [sulfamethoxazole-trimethoprim] Other (See Comments)   ? Renal failure.   Heparin    HIT   Zofran [ondansetron] Shortness Of Breath   "felt like throat was closing"   Clarithromycin Diarrhea, Nausea And Vomiting   Latex Rash   Macrodantin [nitrofurantoin] Rash      Medication List        Accurate as of 09/29/18 11:18 AM. Always use your most recent med list.            acetaminophen 325 MG tablet Commonly known as:  TYLENOL Take 650 mg by mouth daily as needed for moderate pain or headache.   doxylamine (Sleep) 25 MG tablet Commonly known as:  UNISOM Take 25 mg by mouth at bedtime as needed for sleep.   ELIQUIS 5 MG Tabs tablet Generic drug:  apixaban TAKE 1 TABLET BY MOUTH TWICE A DAY   feeding supplement (ENSURE ENLIVE) Liqd Take 237 mLs by mouth 3 (three) times daily between meals.   folic acid 1 MG tablet Commonly known as:  FOLVITE Take 2 tablets (2 mg total) by mouth daily.   lidocaine 5 % Commonly known as:  LIDODERM Place 1 patch onto the skin daily. Remove & Discard patch within 12 hours or as directed by MD       Allergies:  Allergies  Allergen Reactions  . Bactrim [Sulfamethoxazole-Trimethoprim] Other (See Comments)    ? Renal failure.  . Heparin     HIT  . Zofran [Ondansetron] Shortness Of Breath    "felt like throat was closing"  . Clarithromycin Diarrhea and Nausea And Vomiting  . Latex Rash  . Macrodantin [Nitrofurantoin] Rash    Past Medical History, Surgical history, Social history, and Family History were reviewed and updated.  Review of Systems: Review of Systems  Constitutional: Negative.   HENT: Negative.  Eyes: Negative.   Respiratory: Negative.   Cardiovascular: Negative.   Gastrointestinal: Negative.   Genitourinary: Negative.  Negative for dysuria.  Musculoskeletal: Negative.   Skin: Negative.   Neurological: Negative.   Endo/Heme/Allergies: Negative.   Psychiatric/Behavioral: Negative.       Physical Exam:  weight is 199 lb (90.3 kg). Her oral temperature is 98.2 F (36.8 C). Her blood pressure is 130/73 and her pulse is 72. Her respiration is 0 (abnormal) and oxygen saturation is 99%.   Wt Readings from Last 3 Encounters:  09/29/18 199 lb (90.3 kg)  08/31/18 198 lb (89.8 kg)  08/18/18 197 lb 1.9 oz (89.4 kg)    Physical Exam  Constitutional: She is oriented to person, place, and  time.  Her breast exam shows left breast with no masses, edema or erythema.  There is no left axillary adenopathy.  Her right breast is contracted from surgery.  She has a well-healed lumpectomy at the 7 o'clock position.  She has no masses in the right breast.  The right axillary sentinel lymph node incision is also well-healed.  HENT:  Head: Normocephalic and atraumatic.  Mouth/Throat: Oropharynx is clear and moist.  Eyes: Pupils are equal, round, and reactive to light. EOM are normal.  Neck: Normal range of motion.  Cardiovascular: Normal rate, regular rhythm and normal heart sounds.  Pulmonary/Chest: Effort normal and breath sounds normal.  Abdominal: Soft. Bowel sounds are normal.  Musculoskeletal: Normal range of motion. She exhibits no edema, tenderness or deformity.  Lymphadenopathy:    She has no cervical adenopathy.  Neurological: She is alert and oriented to person, place, and time.  Skin: Skin is warm and dry. No rash noted. No erythema.  Psychiatric: She has a normal mood and affect. Her behavior is normal. Judgment and thought content normal.  Vitals reviewed.    Lab Results  Component Value Date   WBC 4.9 09/29/2018   HGB 13.3 09/29/2018   HCT 41.4 09/29/2018   MCV 100.2 (H) 09/29/2018   PLT 142 (L) 09/29/2018   Lab Results  Component Value Date   FERRITIN 820 (H) 05/17/2018   IRON 272 (H) 05/17/2018   TIBC 295 05/17/2018   UIBC 23 05/17/2018   IRONPCTSAT 92 (H) 05/17/2018   Lab Results  Component Value Date   RETICCTPCT 1.3 03/27/2011   RBC 4.13 09/29/2018   RETICCTABS 56.4 03/27/2011   Lab Results  Component Value Date   KPAFRELGTCHN 23.1 (H) 02/16/2018   LAMBDASER 15.8 02/16/2018   KAPLAMBRATIO 1.46 02/16/2018   Lab Results  Component Value Date   IGGSERUM 924 02/16/2018   IGA 222 02/16/2018   IGMSERUM 126 02/16/2018   Lab Results  Component Value Date   TOTALPROTELP 5.2 (L) 03/30/2018   ALBUMINELP 3.0 03/30/2018   A1GS 0.2 03/30/2018    A2GS 0.7 03/30/2018   BETS 0.8 03/30/2018   BETA2SER 0.4 07/02/2015   GAMS 0.6 03/30/2018   MSPIKE Not Observed 03/30/2018   SPEI Comment 03/30/2018     Chemistry      Component Value Date/Time   NA 138 09/08/2018 1005   NA 141 07/06/2017 0816   NA 141 01/05/2017 0956   K 4.1 09/08/2018 1005   K 3.6 07/06/2017 0816   K 4.3 01/05/2017 0956   CL 109 (H) 09/08/2018 1005   CL 101 07/06/2017 0816   CO2 28 09/08/2018 1005   CO2 28 07/06/2017 0816   CO2 23 01/05/2017 0956   BUN 26 (H) 09/08/2018 1005  BUN 28 (H) 07/06/2017 0816   BUN 22.4 01/05/2017 0956   CREATININE 1.60 (H) 09/08/2018 1005   CREATININE 1.6 (H) 07/06/2017 0816   CREATININE 1.4 (H) 01/05/2017 0956      Component Value Date/Time   CALCIUM 9.5 09/08/2018 1005   CALCIUM 9.1 07/06/2017 0816   CALCIUM 9.3 01/05/2017 0956   ALKPHOS 60 09/08/2018 1005   ALKPHOS 58 07/06/2017 0816   ALKPHOS 52 01/05/2017 0956   AST 23 09/08/2018 1005   AST 14 01/05/2017 0956   ALT 20 09/08/2018 1005   ALT 58 (H) 07/06/2017 0816   ALT 13 01/05/2017 0956   BILITOT 0.7 09/08/2018 1005   BILITOT 0.56 01/05/2017 0956      Impression and Plan: Ms. Rooke is a very pleasant 67 yo caucasian female with past history of kappa light chain myeloma with stem cell transplant. She has now been diagnosed with locally advanced infiltrating ductal carcinoma of the right breast, ER(-)/PR(-)/HER-2(+), Ki67 of 75%.   We will proceed with Herceptin today.  I will give her 9 months of Herceptin.  I told her that we really cannot do less than this.  We will have her come back in 1 month.  I know that she will have a nice Thanksgiving.  She is excited that she does not have to come here the day before Thanksgiving.  [name], MD 11/6/201911:18 AM

## 2018-09-29 NOTE — Patient Instructions (Signed)
[facility] Discharge Instructions for Patients Receiving Chemotherapy  Today you received the following chemotherapy agents: Herceptin   To help prevent nausea and vomiting after your treatment, we encourage you to take your nausea medication as directed.    If you develop nausea and vomiting that is not controlled by your nausea medication, call the clinic.   BELOW ARE SYMPTOMS THAT SHOULD BE REPORTED IMMEDIATELY:  *FEVER GREATER THAN 100.5 F  *CHILLS WITH OR WITHOUT FEVER  NAUSEA AND VOMITING THAT IS NOT CONTROLLED WITH YOUR NAUSEA MEDICATION  *UNUSUAL SHORTNESS OF BREATH  *UNUSUAL BRUISING OR BLEEDING  TENDERNESS IN MOUTH AND THROAT WITH OR WITHOUT PRESENCE OF ULCERS  *URINARY PROBLEMS  *BOWEL PROBLEMS  UNUSUAL RASH Items with * indicate a potential emergency and should be followed up as soon as possible.  Feel free to call the clinic you have any questions or concerns. The clinic phone number is [phone].  Please show the CHEMO ALERT CARD at check-in to the Emergency Department and triage nurse.   

## 2018-10-13 ENCOUNTER — Encounter: Payer: Self-pay | Admitting: *Deleted

## 2018-10-20 ENCOUNTER — Other Ambulatory Visit: Payer: 59

## 2018-10-20 ENCOUNTER — Ambulatory Visit: Payer: 59

## 2018-10-20 ENCOUNTER — Ambulatory Visit: Payer: 59 | Admitting: Hematology & Oncology

## 2018-10-22 ENCOUNTER — Other Ambulatory Visit: Payer: Self-pay | Admitting: Radiology

## 2018-10-25 ENCOUNTER — Other Ambulatory Visit: Payer: Self-pay | Admitting: Student

## 2018-10-26 ENCOUNTER — Ambulatory Visit (HOSPITAL_COMMUNITY)
Admission: RE | Admit: 2018-10-26 | Discharge: 2018-10-26 | Disposition: A | Discharge status: Home / Self Care | Payer: 59 | Source: Ambulatory Visit | Attending: Hematology & Oncology | Admitting: Hematology & Oncology

## 2018-10-26 ENCOUNTER — Encounter (HOSPITAL_COMMUNITY): Payer: Self-pay | Admitting: Interventional Radiology

## 2018-10-26 ENCOUNTER — Other Ambulatory Visit: Payer: Self-pay

## 2018-10-26 DIAGNOSIS — Z9889 Other specified postprocedural states: Secondary | ICD-10-CM | POA: Insufficient documentation

## 2018-10-26 DIAGNOSIS — Z86718 Personal history of other venous thrombosis and embolism: Secondary | ICD-10-CM | POA: Diagnosis not present

## 2018-10-26 DIAGNOSIS — C9000 Multiple myeloma not having achieved remission: Secondary | ICD-10-CM | POA: Diagnosis not present

## 2018-10-26 DIAGNOSIS — Z90711 Acquired absence of uterus with remaining cervical stump: Secondary | ICD-10-CM | POA: Diagnosis not present

## 2018-10-26 DIAGNOSIS — I2601 Septic pulmonary embolism with acute cor pulmonale: Secondary | ICD-10-CM

## 2018-10-26 DIAGNOSIS — Z881 Allergy status to other antibiotic agents status: Secondary | ICD-10-CM | POA: Insufficient documentation

## 2018-10-26 DIAGNOSIS — Z882 Allergy status to sulfonamides status: Secondary | ICD-10-CM | POA: Diagnosis not present

## 2018-10-26 DIAGNOSIS — Z8673 Personal history of transient ischemic attack (TIA), and cerebral infarction without residual deficits: Secondary | ICD-10-CM | POA: Diagnosis not present

## 2018-10-26 DIAGNOSIS — Z452 Encounter for adjustment and management of vascular access device: Secondary | ICD-10-CM | POA: Insufficient documentation

## 2018-10-26 DIAGNOSIS — Z888 Allergy status to other drugs, medicaments and biological substances status: Secondary | ICD-10-CM | POA: Diagnosis not present

## 2018-10-26 DIAGNOSIS — Z7901 Long term (current) use of anticoagulants: Secondary | ICD-10-CM | POA: Diagnosis not present

## 2018-10-26 DIAGNOSIS — Z91040 Latex allergy status: Secondary | ICD-10-CM | POA: Diagnosis not present

## 2018-10-26 DIAGNOSIS — N189 Chronic kidney disease, unspecified: Secondary | ICD-10-CM | POA: Insufficient documentation

## 2018-10-26 DIAGNOSIS — I129 Hypertensive chronic kidney disease with stage 1 through stage 4 chronic kidney disease, or unspecified chronic kidney disease: Secondary | ICD-10-CM | POA: Insufficient documentation

## 2018-10-26 HISTORY — PX: IR IVC FILTER RETRIEVAL / S&I /IMG GUID/MOD SED: IMG5308

## 2018-10-26 LAB — PROTIME-INR
INR: 1.05
Prothrombin Time: 13.6 seconds (ref 11.4–15.2)

## 2018-10-26 LAB — CBC
HCT: 31.8 % — ABNORMAL LOW (ref 36.0–46.0)
Hemoglobin: 10.1 g/dL — ABNORMAL LOW (ref 12.0–15.0)
MCH: 32.7 pg (ref 26.0–34.0)
MCHC: 31.8 g/dL (ref 30.0–36.0)
MCV: 102.9 fL — ABNORMAL HIGH (ref 80.0–100.0)
Platelets: 130 10*3/uL — ABNORMAL LOW (ref 150–400)
RBC: 3.09 MIL/uL — ABNORMAL LOW (ref 3.87–5.11)
RDW: 13 % (ref 11.5–15.5)
WBC: 3.7 10*3/uL — ABNORMAL LOW (ref 4.0–10.5)
nRBC: 0 % (ref 0.0–0.2)

## 2018-10-26 LAB — BASIC METABOLIC PANEL
Anion gap: 7 (ref 5–15)
BUN: 20 mg/dL (ref 8–23)
CO2: 20 mmol/L — AB (ref 22–32)
Calcium: 6.5 mg/dL — ABNORMAL LOW (ref 8.9–10.3)
Chloride: 115 mmol/L — ABNORMAL HIGH (ref 98–111)
Creatinine, Ser: 1.08 mg/dL — ABNORMAL HIGH (ref 0.44–1.00)
GFR calc non Af Amer: 52 mL/min — ABNORMAL LOW (ref >60)
Glucose, Bld: 76 mg/dL (ref 70–99)
Potassium: 2.5 mmol/L — CL (ref 3.5–5.1)
Sodium: 142 mmol/L (ref 135–145)

## 2018-10-26 MED ORDER — ANTICOAGULANT SODIUM CITRATE 4% (200MG/5ML) IV SOLN
5.0000 mL | Freq: Once | Status: AC
  Administered 2018-10-26: 5 mL via INTRAVENOUS
  Filled 2018-10-26 (×2): qty 5

## 2018-10-26 MED ORDER — MIDAZOLAM HCL 2 MG/2ML IJ SOLN
INTRAMUSCULAR | Status: AC
  Filled 2018-10-26: qty 4

## 2018-10-26 MED ORDER — POTASSIUM CHLORIDE 10 MEQ/50ML IV SOLN
10.0000 meq | Freq: Once | INTRAVENOUS | Status: AC
  Administered 2018-10-26: 10 meq via INTRAVENOUS
  Filled 2018-10-26: qty 50

## 2018-10-26 MED ORDER — POTASSIUM CHLORIDE CRYS ER 20 MEQ PO TBCR
40.0000 meq | EXTENDED_RELEASE_TABLET | Freq: Once | ORAL | Status: AC
  Administered 2018-10-26: 40 meq via ORAL
  Filled 2018-10-26: qty 2

## 2018-10-26 MED ORDER — MIDAZOLAM HCL 2 MG/2ML IJ SOLN
INTRAMUSCULAR | Status: AC | PRN
  Administered 2018-10-26 (×2): 1 mg via INTRAVENOUS

## 2018-10-26 MED ORDER — SODIUM CHLORIDE 0.9 % IV SOLN
INTRAVENOUS | Status: DC
  Administered 2018-10-26: 10:00:00 via INTRAVENOUS

## 2018-10-26 MED ORDER — LIDOCAINE HCL 1 % IJ SOLN
INTRAMUSCULAR | Status: AC | PRN
  Administered 2018-10-26: 5 mL

## 2018-10-26 MED ORDER — FENTANYL CITRATE (PF) 100 MCG/2ML IJ SOLN
INTRAMUSCULAR | Status: AC
  Filled 2018-10-26: qty 4

## 2018-10-26 MED ORDER — FENTANYL CITRATE (PF) 100 MCG/2ML IJ SOLN
INTRAMUSCULAR | Status: AC | PRN
  Administered 2018-10-26: 50 ug via INTRAVENOUS

## 2018-10-26 MED ORDER — IOPAMIDOL (ISOVUE-300) INJECTION 61%
INTRAVENOUS | Status: AC
  Filled 2018-10-26: qty 100

## 2018-10-26 MED ORDER — LIDOCAINE HCL 1 % IJ SOLN
INTRAMUSCULAR | Status: AC
  Filled 2018-10-26: qty 20

## 2018-10-26 NOTE — H&P (Signed)
Chief Complaint: Patient was seen in consultation today for removal of IVC filter  Referring Physician(s): [name],Marcia R  Supervising Physician: Marcia [name]  Patient Status: [name] - Out-pt  History of Present Illness: Marcia [name] is a 67 y.o. female with a past medical history significant for anemia, CKD, HTN, TIA, multiple myeloma, breast cancer bilateral DVT and PE. She is known to IR from previous IVC filter placement 06/29/18 by Marcia [name] for temporary caval interruption prior to impending breast cancer surgery. She was seen in IR clinic on 08/31/18 to discuss removal of IVC filter for which she presents today.  Patient states that she's been feeling well, has no complaints. She states that she is hopeful that the removal of her IVC filter goes as well as the placement.   Past Medical History:  Diagnosis Date  . Anemia   . Cancer [facility])    Multiple Myeloma  . Chronic kidney disease    [name]/t multiple myeloma  . Counseling regarding goals of care 02/02/2018  . DVT (deep venous thrombosis) (HCC)    right and left leg  . Hypertension   . 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 Primary cancer of right breast without evidence of regional lymph node metastasis (N0) ([place]) 02/01/2018  . Pulmonary embolism ([place])    lung  . TIA (transient ischemic attack) 05/09/2018   Marcia [name]    Past Surgical History:  Procedure Laterality Date  . ABDOMINAL HYSTERECTOMY     partial  . APPENDECTOMY    . BREAST LUMPECTOMY WITH RADIOACTIVE SEED AND SENTINEL LYMPH NODE BIOPSY Right 07/05/2018   Procedure: RIGHT BREAST LUMPECTOMY WITH RADIOACTIVE SEED AND SENTINEL LYMPH NODE BIOPSY,INJECT BLUE DYE RIGHT BREAST;  Surgeon: Marcia Skates, MD;  Location: [place];  Service: General;  Laterality: Right;  . BREAST SURGERY     biopsy  . IR IVC FILTER PLMT / S&I /IMG GUID/MOD SED  06/29/2018  . IR RADIOLOGIST EVAL & MGMT  08/31/2018  . PORTACATH PLACEMENT Left 02/12/2018   Procedure: INSERTION PORT-A-CATH;  Surgeon: Marcia Skates, MD;  Location: [place];  Service: General;  Laterality: Left;  . TUBAL LIGATION      Allergies: Bactrim [sulfamethoxazole-trimethoprim]; Heparin; Zofran [ondansetron]; Clarithromycin; Latex; and Macrodantin [nitrofurantoin]  Medications: Prior to Admission medications   Medication Sig Start Date End Date Taking? Authorizing Provider  acetaminophen (TYLENOL) 325 MG tablet Take 650 mg by mouth daily as needed for moderate pain or headache.   Yes [provider]  doxylamine, Sleep, (UNISOM) 25 MG tablet Take 25 mg by mouth at bedtime as needed for sleep.   Yes [provider]  ELIQUIS 5 MG TABS tablet TAKE 1 TABLET BY MOUTH TWICE A DAY Patient taking differently: Take 5 mg by mouth 2 (two) times daily.  09/15/18  Yes [name], Marcia Humbles, NP  feeding supplement, ENSURE ENLIVE, (ENSURE ENLIVE) LIQD Take 237 mLs by mouth 3 (three) times daily between meals. Patient taking differently: Take 237 mLs by mouth 2 (two) times daily.  05/22/18  Yes Marcia Milch D, MD  folic acid (FOLVITE) 1 MG tablet Take 2 tablets (2 mg total) by mouth daily. 06/16/18  Yes Marcia Napoleon, MD  lidocaine-prilocaine (EMLA) cream Apply 1 application topically as needed (local anesthetic).   Yes [provider]     Family History  Problem Relation Age of Onset  . COPD Mother   . COPD Father     Social History  Socioeconomic History  . Marital status: Married    Spouse name: Not on file  . Number of children: Not on file  . Years of education: Not on file  . Highest education level: Not on file  Occupational History  . Not on file  Social Needs  . Financial resource strain: Not on file  . Food insecurity:    Worry: Not on file    Inability: Not on file  . Transportation needs:    Medical: Not on file    Non-medical: Not on file  Tobacco Use  . Smoking status: Never Smoker  . Smokeless tobacco: Never Used  . Tobacco  comment: never used product  Substance and Sexual Activity  . Alcohol use: Never    Alcohol/week: 0.0 standard drinks    Frequency: Never  . Drug use: Never  . Sexual activity: Not on file  Lifestyle  . Physical activity:    Days per week: Not on file    Minutes per session: Not on file  . Stress: Not on file  Relationships  . Social connections:    Talks on phone: Not on file    Gets together: Not on file    Attends religious service: Not on file    Active member of club or organization: Not on file    Attends meetings of clubs or organizations: Not on file    Relationship status: Not on file  Other Topics Concern  . Not on file  Social History Narrative  . Not on file     Review of Systems: A 12 point ROS discussed and pertinent positives are indicated in the HPI above.  All other systems are negative.  Review of Systems  Constitutional: Negative for appetite change, chills and fever.  Respiratory: Negative for cough and shortness of breath.   Cardiovascular: Negative for chest pain.  Gastrointestinal: Negative for abdominal pain, diarrhea, nausea and vomiting.  Skin: Negative for rash.  Neurological: Negative for dizziness and syncope.  Hematological: Does not bruise/bleed easily.  Psychiatric/Behavioral: Negative for confusion.    Vital Signs: BP 131/87   Pulse 78   Temp (!) 97.4 F (36.3 C) (Oral)   Ht '4\' 11"'$  (1.499 m)   Wt 201 lb (91.2 kg)   SpO2 96%   BMI 40.60 kg/m   Physical Exam  Constitutional: She is oriented to person, place, and time. No distress.  HENT:  Head: Normocephalic.  Cardiovascular: Normal rate, regular rhythm and normal heart sounds.  Pulmonary/Chest: Effort normal and breath sounds normal.  Left side port accessed  Abdominal: Soft. Bowel sounds are normal. She exhibits no distension. There is no tenderness.  Neurological: She is alert and oriented to person, place, and time.  Skin: Skin is warm and dry. She is not diaphoretic.    Psychiatric: She has a normal mood and affect. Her behavior is normal. Judgment and thought content normal.  Vitals reviewed.    MD Evaluation Airway: WNL Heart: WNL Abdomen: WNL Chest/ Lungs: WNL(left sided port) ASA  Classification: 3 Mallampati/Airway Score: One   Imaging: No results found.  Labs:  CBC: Recent Labs    07/28/18 0830 08/18/18 1224 09/08/18 1005 09/29/18 1054  WBC 7.0 7.8 6.3 4.9  HGB 12.9 13.7 13.4 13.3  HCT 38.1 41.8 41.4 41.4  PLT 194 201 191 142*    COAGS: Recent Labs    05/19/18 2003 05/20/18 0500 05/21/18 0414 06/29/18 0834 07/05/18 1000  INR  --   --   --  0.90 0.91  APTT 57* 63* 63*  --  26    BMP: Recent Labs    05/20/18 0500 05/21/18 0528 05/22/18 0500  06/29/18 0834 07/28/18 0830 08/18/18 1224 09/08/18 1005 09/29/18 1054  NA 136 137 137   < > 143 140 137 138 139  K 2.9* 3.3* 3.4*   < > 3.5 3.9 4.1 4.1 3.9  CL 103 105 104   < > 109 105 107 109* 104  CO2 '25 25 26   '$ < > '24 26 28 28 28  '$ GLUCOSE 110* 105* 98   < > 102* 96 92 95 94  BUN 30* 21 16   < > 22 24* 26* 26* 31*  CALCIUM 7.6* 7.8* 7.9*   < > 9.0 9.1 9.3 9.5 9.2  CREATININE 1.33* 1.25* 1.23*   < > 1.36* 1.30* 1.50* 1.60* 1.60*  GFRNONAA 40* 43* 44*  --  39*  --   --   --   --   GFRAA 46* 50* 51*  --  45*  --   --   --   --    < > = values in this interval not displayed.    LIVER FUNCTION TESTS: Recent Labs    07/28/18 0830 08/18/18 1224 09/08/18 1005 09/29/18 1054  BILITOT 0.6 0.6 0.7 0.7  AST '20 26 23 23  '$ ALT '20 23 20 22  '$ ALKPHOS 63 52 60 53  PROT 6.7 6.7 6.8 6.9  ALBUMIN 3.5 3.8 3.9 3.9    TUMOR MARKERS: No results for input(s): AFPTM, CEA, CA199, CHROMGRNA in the last 8760 hours.  Assessment and Plan:  Patient with IVC filter placed 06/29/18 by Marcia [name] now on Eliquis without issue. Patient seen by Marcia [name] in IR clinic on 08/31/18 and was approved for filter removal for which she presents today.  Patient has been NPO since last evening, she  did not take any medications this morning, last dose of Eliquis 11/30. She is afebrile, pre-procedure labs are pending at time of this note writing.   Risks and benefits discussed with the patient including, but not limited to bleeding, infection, contrast induced renal failure, filter fracture or migration which can lead to emergency surgery or even death, strut penetration  with damage or irritation to adjacent structures and caval thrombosis.  All of the patient's questions were answered, patient is agreeable to proceed.  Consent signed and in chart.  Thank you for this interesting consult.  I greatly enjoyed meeting Marcia [name] and look forward to participating in their care.  A copy of this report was sent to the requesting provider on this date.  Electronically Signed: Joaquim Nam, PA-C 10/26/2018, 7:58 AM   I spent a total of  25 Minutes in face to face in clinical consultation, greater than 50% of which was counseling/coordinating care for IVC filter removal.

## 2018-10-26 NOTE — Discharge Instructions (Signed)
Venogram, Care After °This sheet gives you information about how to care for yourself after your procedure. Your health care provider may also give you more specific instructions. If you have problems or questions, contact your health care provider. °What can I expect after the procedure? °After the procedure, it is common to have: °· Bruising or mild discomfort in the area where the IV was inserted (insertion site). ° °Follow these instructions at home: °Eating and drinking °· Follow instructions from your health care provider about eating or drinking restrictions. °· Drink a lot of fluids for the first several days after the procedure, as directed by your health care provider. This helps to wash (flush) the contrast out of your body. Examples of healthy fluids include water or low-calorie drinks. °General instructions °· Check your IV insertion area every day for signs of infection. Check for: °? Redness, swelling, or pain. °? Fluid or blood. °? Warmth. °? Pus or a bad smell. °· Take over-the-counter and prescription medicines only as told by your health care provider. °· Rest and return to your normal activities as told by your health care provider. Ask your health care provider what activities are safe for you. °· Do not drive for 24 hours if you were given a medicine to help you relax (sedative), or until your health care provider approves. °· Keep all follow-up visits as told by your health care provider. This is important. °Contact a health care provider if: °· Your skin becomes itchy or you develop a rash or hives. °· You have a fever that does not get better with medicine. °· You feel nauseous. °· You vomit. °· You have redness, swelling, or pain around the insertion site. °· You have fluid or blood coming from the insertion site. °· Your insertion area feels warm to the touch. °· You have pus or a bad smell coming from the insertion site. °Get help right away if: °· You have difficulty breathing or  shortness of breath. °· You develop chest pain. °· You faint. °· You feel very dizzy. °These symptoms may represent a serious problem that is an emergency. Do not wait to see if the symptoms will go away. Get medical help right away. Call your local emergency services (911 in the [place] Do not drive yourself to the hospital. °Summary °· After your procedure, it is common to have bruising or mild discomfort in the area where the IV was inserted. °· You should check your IV insertion area every day for signs of infection. °· Take over-the-counter and prescription medicines only as told by your health care provider. °· You should drink a lot of fluids for the first several days after the procedure to help flush the contrast from your body. °This information is not intended to replace advice given to you by your health care provider. Make sure you discuss any questions you have with your health care provider. °Document Released: 08/31/2013 Document Revised: 10/04/2016 Document Reviewed: 10/04/2016 °Elsevier Interactive Patient Education © 2017 [employer] ° °Moderate Conscious Sedation, Adult, Care After °These instructions provide you with information about caring for yourself after your procedure. Your health care provider may also give you more specific instructions. Your treatment has been planned according to current medical practices, but problems sometimes occur. Call your health care provider if you have any problems or questions after your procedure. °What can I expect after the procedure? °After your procedure, it is common: °· To feel sleepy for several hours. °· To feel   clumsy and have poor balance for several hours. °· To have poor judgment for several hours. °· To vomit if you eat too soon. ° °Follow these instructions at home: °For at least 24 hours after the procedure: ° °· Do not: °? Participate in activities where you could fall or become injured. °? Drive. °? Use heavy machinery. °? Drink  alcohol. °? Take sleeping pills or medicines that cause drowsiness. °? Make important decisions or sign legal documents. °? Take care of children on your own. °· Rest. °Eating and drinking °· Follow the diet recommended by your health care provider. °· If you vomit: °? Drink water, juice, or soup when you can drink without vomiting. °? Make sure you have little or no nausea before eating solid foods. °General instructions °· Have a responsible adult stay with you until you are awake and alert. °· Take over-the-counter and prescription medicines only as told by your health care provider. °· If you smoke, do not smoke without supervision. °· Keep all follow-up visits as told by your health care provider. This is important. °Contact a health care provider if: °· You keep feeling nauseous or you keep vomiting. °· You feel light-headed. °· You develop a rash. °· You have a fever. °Get help right away if: °· You have trouble breathing. °This information is not intended to replace advice given to you by your health care provider. Make sure you discuss any questions you have with your health care provider. °Document Released: 08/31/2013 Document Revised: 04/14/2016 Document Reviewed: 03/01/2016 °Elsevier Interactive Patient Education © 2018 [employer] ° °

## 2018-10-26 NOTE — Procedures (Signed)
Pre procedural Dx: DVT/PE with contraindication to anticoagulation while in the peri operative period.  Post procedural Dx: Same  Successful fluoroscopic guided retrieval of IVC filter. Access: R IJ vein. EBL: None No immediate post procedural complications.  Ronny Bacon, MD Pager #: [phone]

## 2018-10-27 ENCOUNTER — Inpatient Hospital Stay (HOSPITAL_BASED_OUTPATIENT_CLINIC_OR_DEPARTMENT_OTHER): Payer: 59 | Admitting: Hematology & Oncology

## 2018-10-27 ENCOUNTER — Other Ambulatory Visit: Payer: Self-pay

## 2018-10-27 ENCOUNTER — Inpatient Hospital Stay: Payer: 59

## 2018-10-27 ENCOUNTER — Encounter: Payer: Self-pay | Admitting: Hematology & Oncology

## 2018-10-27 ENCOUNTER — Inpatient Hospital Stay: Payer: 59 | Attending: Hematology & Oncology

## 2018-10-27 VITALS — BP 145/75 | HR 70 | Temp 97.6°F | Wt 201.1 lb

## 2018-10-27 DIAGNOSIS — C50411 Malignant neoplasm of upper-outer quadrant of right female breast: Secondary | ICD-10-CM

## 2018-10-27 DIAGNOSIS — Z923 Personal history of irradiation: Secondary | ICD-10-CM | POA: Insufficient documentation

## 2018-10-27 DIAGNOSIS — C9001 Multiple myeloma in remission: Secondary | ICD-10-CM

## 2018-10-27 DIAGNOSIS — C50911 Malignant neoplasm of unspecified site of right female breast: Secondary | ICD-10-CM

## 2018-10-27 DIAGNOSIS — Z9484 Stem cells transplant status: Secondary | ICD-10-CM | POA: Insufficient documentation

## 2018-10-27 DIAGNOSIS — Z79899 Other long term (current) drug therapy: Secondary | ICD-10-CM | POA: Diagnosis not present

## 2018-10-27 DIAGNOSIS — Z7901 Long term (current) use of anticoagulants: Secondary | ICD-10-CM | POA: Diagnosis not present

## 2018-10-27 DIAGNOSIS — L598 Other specified disorders of the skin and subcutaneous tissue related to radiation: Secondary | ICD-10-CM | POA: Insufficient documentation

## 2018-10-27 DIAGNOSIS — Z171 Estrogen receptor negative status [ER-]: Secondary | ICD-10-CM | POA: Diagnosis not present

## 2018-10-27 DIAGNOSIS — Z5112 Encounter for antineoplastic immunotherapy: Secondary | ICD-10-CM | POA: Diagnosis not present

## 2018-10-27 LAB — CMP (CANCER CENTER ONLY)
ALT: 15 U/L (ref 0–44)
AST: 15 U/L (ref 15–41)
Albumin: 3.8 g/dL (ref 3.5–5.0)
Alkaline Phosphatase: 67 U/L (ref 38–126)
Anion gap: 10 (ref 5–15)
BILIRUBIN TOTAL: 0.5 mg/dL (ref 0.3–1.2)
BUN: 24 mg/dL — ABNORMAL HIGH (ref 8–23)
CO2: 24 mmol/L (ref 22–32)
Calcium: 8.8 mg/dL — ABNORMAL LOW (ref 8.9–10.3)
Chloride: 106 mmol/L (ref 98–111)
Creatinine: 1.41 mg/dL — ABNORMAL HIGH (ref 0.44–1.00)
GFR, Est AFR Am: 44 mL/min — ABNORMAL LOW (ref >60)
GFR, Estimated: 38 mL/min — ABNORMAL LOW (ref >60)
Glucose, Bld: 88 mg/dL (ref 70–99)
Potassium: 3.8 mmol/L (ref 3.5–5.1)
Sodium: 140 mmol/L (ref 135–145)
Total Protein: 6.2 g/dL — ABNORMAL LOW (ref 6.5–8.1)

## 2018-10-27 LAB — CBC WITH DIFFERENTIAL (CANCER CENTER ONLY)
ABS IMMATURE GRANULOCYTES: 0.04 10*3/uL (ref 0.00–0.07)
Basophils Absolute: 0 10*3/uL (ref 0.0–0.1)
Basophils Relative: 0 %
Eosinophils Absolute: 0.1 10*3/uL (ref 0.0–0.5)
Eosinophils Relative: 2 %
HCT: 38.7 % (ref 36.0–46.0)
Hemoglobin: 12.8 g/dL (ref 12.0–15.0)
Immature Granulocytes: 1 %
Lymphocytes Relative: 44 %
Lymphs Abs: 2.1 10*3/uL (ref 0.7–4.0)
MCH: 33.3 pg (ref 26.0–34.0)
MCHC: 33.1 g/dL (ref 30.0–36.0)
MCV: 100.8 fL — ABNORMAL HIGH (ref 80.0–100.0)
Monocytes Absolute: 0.4 10*3/uL (ref 0.1–1.0)
Monocytes Relative: 9 %
Neutro Abs: 2.1 10*3/uL (ref 1.7–7.7)
Neutrophils Relative %: 44 %
Platelet Count: 161 10*3/uL (ref 150–400)
RBC: 3.84 MIL/uL — ABNORMAL LOW (ref 3.87–5.11)
RDW: 12.9 % (ref 11.5–15.5)
WBC Count: 4.7 10*3/uL (ref 4.0–10.5)
nRBC: 0 % (ref 0.0–0.2)

## 2018-10-27 MED ORDER — DIPHENHYDRAMINE HCL 25 MG PO CAPS
50.0000 mg | ORAL_CAPSULE | Freq: Once | ORAL | Status: AC
  Administered 2018-10-27: 50 mg via ORAL

## 2018-10-27 MED ORDER — SODIUM CHLORIDE 0.9 % IV SOLN
Freq: Once | INTRAVENOUS | Status: AC
  Administered 2018-10-27: 13:00:00 via INTRAVENOUS
  Filled 2018-10-27: qty 250

## 2018-10-27 MED ORDER — ACETAMINOPHEN 325 MG PO TABS
ORAL_TABLET | ORAL | Status: AC
  Filled 2018-10-27: qty 2

## 2018-10-27 MED ORDER — SODIUM CHLORIDE 0.9% FLUSH
10.0000 mL | INTRAVENOUS | Status: DC | PRN
  Filled 2018-10-27: qty 10

## 2018-10-27 MED ORDER — ANTICOAGULANT SODIUM CITRATE 4% (200MG/5ML) IV SOLN
5.0000 mL | Freq: Once | Status: AC
  Administered 2018-10-27: 5 mL via INTRAVENOUS
  Filled 2018-10-27: qty 5

## 2018-10-27 MED ORDER — SODIUM CHLORIDE 0.9% FLUSH
10.0000 mL | INTRAVENOUS | Status: DC | PRN
  Administered 2018-10-27: 10 mL
  Filled 2018-10-27: qty 10

## 2018-10-27 MED ORDER — DIPHENHYDRAMINE HCL 25 MG PO CAPS
ORAL_CAPSULE | ORAL | Status: AC
  Filled 2018-10-27: qty 2

## 2018-10-27 MED ORDER — HEPARIN SOD (PORK) LOCK FLUSH 100 UNIT/ML IV SOLN
500.0000 [IU] | Freq: Once | INTRAVENOUS | Status: DC | PRN
  Filled 2018-10-27: qty 5

## 2018-10-27 MED ORDER — ACETAMINOPHEN 325 MG PO TABS
650.0000 mg | ORAL_TABLET | Freq: Once | ORAL | Status: AC
  Administered 2018-10-27: 650 mg via ORAL

## 2018-10-27 MED ORDER — TRASTUZUMAB CHEMO 150 MG IV SOLR
6.0000 mg/kg | Freq: Once | INTRAVENOUS | Status: AC
  Administered 2018-10-27: 525 mg via INTRAVENOUS
  Filled 2018-10-27: qty 25

## 2018-10-27 NOTE — Patient Instructions (Signed)
Implanted Port Insertion, Care After °This sheet gives you information about how to care for yourself after your procedure. Your health care provider may also give you more specific instructions. If you have problems or questions, contact your health care provider. °What can I expect after the procedure? °After your procedure, it is common to have: °· Discomfort at the port insertion site. °· Bruising on the skin over the port. This should improve over 3-4 days. ° °Follow these instructions at home: °Port care °· After your port is placed, you will get a manufacturer's information card. The card has information about your port. Keep this card with you at all times. °· Take care of the port as told by your health care provider. Ask your health care provider if you or a family member can get training for taking care of the port at home. A home health care nurse may also take care of the port. °· Make sure to remember what type of port you have. °Incision care °· Follow instructions from your health care provider about how to take care of your port insertion site. Make sure you: °? Wash your hands with soap and water before you change your bandage (dressing). If soap and water are not available, use hand sanitizer. °? Change your dressing as told by your health care provider. °? Leave stitches (sutures), skin glue, or adhesive strips in place. These skin closures may need to stay in place for 2 weeks or longer. If adhesive strip edges start to loosen and curl up, you may trim the loose edges. Do not remove adhesive strips completely unless your health care provider tells you to do that. °· Check your port insertion site every day for signs of infection. Check for: °? More redness, swelling, or pain. °? More fluid or blood. °? Warmth. °? Pus or a bad smell. °General instructions °· Do not take baths, swim, or use a hot tub until your health care provider approves. °· Do not lift anything that is heavier than 10 lb (4.5  kg) for a week, or as told by your health care provider. °· Ask your health care provider when it is okay to: °? Return to work or school. °? Resume usual physical activities or sports. °· Do not drive for 24 hours if you were given a medicine to help you relax (sedative). °· Take over-the-counter and prescription medicines only as told by your health care provider. °· Wear a medical alert bracelet in case of an emergency. This will tell any health care providers that you have a port. °· Keep all follow-up visits as told by your health care provider. This is important. °Contact a health care provider if: °· You cannot flush your port with saline as directed, or you cannot draw blood from the port. °· You have a fever or chills. °· You have more redness, swelling, or pain around your port insertion site. °· You have more fluid or blood coming from your port insertion site. °· Your port insertion site feels warm to the touch. °· You have pus or a bad smell coming from the port insertion site. °Get help right away if: °· You have chest pain or shortness of breath. °· You have bleeding from your port that you cannot control. °Summary °· Take care of the port as told by your health care provider. °· Change your dressing as told by your health care provider. °· Keep all follow-up visits as told by your health care provider. °  This information is not intended to replace advice given to you by your health care provider. Make sure you discuss any questions you have with your health care provider. °Document Released: 08/31/2013 Document Revised: 10/01/2016 Document Reviewed: 10/01/2016 °[employer] Interactive Patient Education © 2017 [employer] ° °

## 2018-10-27 NOTE — Progress Notes (Signed)
Hematology and Oncology Follow Up Visit  Marcia [name] [id number] July 14, 1951 67 y.o. 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   So far, she is doing fantastic.  She completed her radiation therapy.  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  None of the skin really has broken down which is nice to see.  She had a wonderful things giving.  She was with her family.  She has had no issues with the Herceptin.  She is had 3 cycles to date.  As far as her myeloma is concerned, this really has not been a problem for her.  She is had no change in bowel or bladder habits.   She did have her IVC filter removed.  She had this placed for her surgery for the breast cancer.  This was done on 10/26/2018.  Overall, her performance status is ECOG 1.     Medications:  Allergies as of 10/27/2018      Reactions   Bactrim [sulfamethoxazole-trimethoprim] Other (See Comments)   ? Renal failure.   Heparin    HIT   Zofran [ondansetron] Shortness Of Breath   "felt like throat was closing"   Clarithromycin Diarrhea, Nausea And Vomiting   Latex Rash   Macrodantin [nitrofurantoin] Rash      Medication List        Accurate as of 10/27/18 12:31 PM. Always use your most recent med list.          acetaminophen 325 MG tablet Commonly known as:  TYLENOL Take 650 mg by mouth daily as needed for moderate pain or headache.   doxylamine (Sleep) 25 MG tablet Commonly known as:   UNISOM Take 25 mg by mouth at bedtime as needed for sleep.   ELIQUIS 5 MG Tabs tablet Generic drug:  apixaban TAKE 1 TABLET BY MOUTH TWICE A DAY   feeding supplement (ENSURE ENLIVE) Liqd Take 237 mLs by mouth 3 (three) times daily between meals.   folic acid 1 MG tablet Commonly known as:  FOLVITE Take 2 tablets (2 mg total) by mouth daily.   lidocaine-prilocaine cream Commonly known as:  EMLA Apply 1 application topically as needed (local anesthetic).       Allergies:  Allergies  Allergen Reactions  . Bactrim [Sulfamethoxazole-Trimethoprim] Other (See Comments)    ? Renal failure.  . Heparin     HIT  . Zofran [Ondansetron] Shortness Of Breath    "felt like throat was closing"  . Clarithromycin Diarrhea and Nausea And Vomiting  . Latex Rash  . Macrodantin [Nitrofurantoin] Rash    Past Medical History, Surgical history, Social history, and Family History were reviewed and updated.  Review of Systems: Review of Systems  Constitutional: Negative.   HENT: Negative.   Eyes: Negative.   Respiratory: Negative.   Cardiovascular: Negative.   Gastrointestinal: Negative.   Genitourinary: Negative.  Negative for dysuria.  Musculoskeletal: Negative.   Skin: Negative.   Neurological: Negative.   Endo/Heme/Allergies: Negative.   Psychiatric/Behavioral: Negative.       Physical Exam:  weight is 201 lb 1.6 oz (91.2 kg). Her oral temperature is 97.6 F (36.4 C). Her blood pressure is 145/75 (abnormal) and her pulse is 70. Her oxygen saturation is 100%.   Wt Readings from Last 3 Encounters:  10/27/18 201 lb 1.6 oz (91.2 kg)  10/26/18 201 lb (91.2 kg)  09/29/18 199 lb (90.3 kg)    Physical Exam  Constitutional: She is oriented to person, place, and time.  Her breast exam shows left breast with no masses, edema or erythema.  There is no left axillary adenopathy.  Her right breast is contracted from surgery.  She has a well-healed lumpectomy at the 7 o'clock position.  She  has no masses in the right breast.  The right axillary sentinel lymph node incision is also well-healed.  HENT:  Head: Normocephalic and atraumatic.  Mouth/Throat: Oropharynx is clear and moist.  Eyes: Pupils are equal, round, and reactive to light. EOM are normal.  Neck: Normal range of motion.  Cardiovascular: Normal rate, regular rhythm and normal heart sounds.  Pulmonary/Chest: Effort normal and breath sounds normal.  Abdominal: Soft. Bowel sounds are normal.  Musculoskeletal: Normal range of motion. She exhibits no edema, tenderness or deformity.  Lymphadenopathy:    She has no cervical adenopathy.  Neurological: She is alert and oriented to person, place, and time.  Skin: Skin is warm and dry. No rash noted. No erythema.  Psychiatric: She has a normal mood and affect. Her behavior is normal. Judgment and thought content normal.  Vitals reviewed.    Lab Results  Component Value Date   WBC 4.7 10/27/2018   HGB 12.8 10/27/2018   HCT 38.7 10/27/2018   MCV 100.8 (H) 10/27/2018   PLT 161 10/27/2018   Lab Results  Component Value Date   FERRITIN 820 (H) 05/17/2018   IRON 272 (H) 05/17/2018   TIBC 295 05/17/2018   UIBC 23 05/17/2018   IRONPCTSAT 92 (H) 05/17/2018   Lab Results  Component Value Date   RETICCTPCT 1.3 03/27/2011   RBC 3.84 (L) 10/27/2018   RETICCTABS 56.4 03/27/2011   Lab Results  Component Value Date   KPAFRELGTCHN 23.1 (H) 02/16/2018   LAMBDASER 15.8 02/16/2018   KAPLAMBRATIO 1.46 02/16/2018   Lab Results  Component Value Date   IGGSERUM 924 02/16/2018   IGA 222 02/16/2018   IGMSERUM 126 02/16/2018   Lab Results  Component Value Date   TOTALPROTELP 5.2 (L) 03/30/2018   ALBUMINELP 3.0 03/30/2018   A1GS 0.2 03/30/2018   A2GS 0.7 03/30/2018   BETS 0.8 03/30/2018   BETA2SER 0.4 07/02/2015   GAMS 0.6 03/30/2018   MSPIKE Not Observed 03/30/2018   SPEI Comment 03/30/2018     Chemistry      Component Value Date/Time   NA 140 10/27/2018 1125    NA 141 07/06/2017 0816   NA 141 01/05/2017 0956   K 3.8 10/27/2018 1125   K 3.6 07/06/2017 0816   K 4.3 01/05/2017 0956   CL 106 10/27/2018 1125   CL 101 07/06/2017 0816   CO2 24 10/27/2018 1125   CO2 28 07/06/2017 0816   CO2 23 01/05/2017 0956   BUN 24 (H) 10/27/2018 1125   BUN 28 (H) 07/06/2017 0816   BUN 22.4 01/05/2017 0956   CREATININE 1.41 (H) 10/27/2018 1125   CREATININE 1.6 (H) 07/06/2017 0816   CREATININE 1.4 (H) 01/05/2017 0956      Component Value Date/Time   CALCIUM 8.8 (L) 10/27/2018 1125   CALCIUM 9.1 07/06/2017 0816  CALCIUM 9.3 01/05/2017 0956   ALKPHOS 67 10/27/2018 1125   ALKPHOS 58 07/06/2017 0816   ALKPHOS 52 01/05/2017 0956   AST 15 10/27/2018 1125   AST 14 01/05/2017 0956   ALT 15 10/27/2018 1125   ALT 58 (H) 07/06/2017 0816   ALT 13 01/05/2017 0956   BILITOT 0.5 10/27/2018 1125   BILITOT 0.56 01/05/2017 0956      Impression and Plan: Marcia [name] is a very pleasant 67 yo caucasian female with past history of kappa light chain myeloma with stem cell transplant. She has now been diagnosed with locally advanced infiltrating ductal carcinoma of the right breast, ER(-)/PR(-)/HER-2(+), Ki67 of 75%.   We will proceed with Herceptin today.  I will give her 9 months of Herceptin.  I told her that we really cannot do less than this.  I will give her a break over Christmas and New Year's.  We will get her back the first week in January.  I think she really deserves this.  She has done so well   [name], MD 12/4/201912:31 PM

## 2018-10-27 NOTE — Patient Instructions (Signed)
Trastuzumab injection for infusion What is this medicine? TRASTUZUMAB (tras TOO zoo mab) is a monoclonal antibody. It is used to treat breast cancer and stomach cancer. This medicine may be used for other purposes; ask your health care provider or pharmacist if you have questions. COMMON BRAND NAME(S): Herceptin What should I tell my health care provider before I take this medicine? They need to know if you have any of these conditions: -heart disease -heart failure -lung or breathing disease, like asthma -an unusual or allergic reaction to trastuzumab, benzyl alcohol, or other medications, foods, dyes, or preservatives -pregnant or trying to get pregnant -breast-feeding How should I use this medicine? This drug is given as an infusion into a vein. It is administered in a hospital or clinic by a specially trained health care professional. Talk to your pediatrician regarding the use of this medicine in children. This medicine is not approved for use in children. Overdosage: If you think you have taken too much of this medicine contact a poison control center or emergency room at once. NOTE: This medicine is only for you. Do not share this medicine with others. What if I miss a dose? It is important not to miss a dose. Call your doctor or health care professional if you are unable to keep an appointment. What may interact with this medicine? This medicine may interact with the following medications: -certain types of chemotherapy, such as daunorubicin, doxorubicin, epirubicin, and idarubicin This list may not describe all possible interactions. Give your health care provider a list of all the medicines, herbs, non-prescription drugs, or dietary supplements you use. Also tell them if you smoke, drink alcohol, or use illegal drugs. Some items may interact with your medicine. What should I watch for while using this medicine? Visit your doctor for checks on your progress. Report any side effects.  Continue your course of treatment even though you feel ill unless your doctor tells you to stop. Call your doctor or health care professional for advice if you get a fever, chills or sore throat, or other symptoms of a cold or flu. Do not treat yourself. Try to avoid being around people who are sick. You may experience fever, chills and shaking during your first infusion. These effects are usually mild and can be treated with other medicines. Report any side effects during the infusion to your health care professional. Fever and chills usually do not happen with later infusions. Do not become pregnant while taking this medicine or for 7 months after stopping it. Women should inform their doctor if they wish to become pregnant or think they might be pregnant. Women of child-bearing potential will need to have a negative pregnancy test before starting this medicine. There is a potential for serious side effects to an unborn child. Talk to your health care professional or pharmacist for more information. Do not breast-feed an infant while taking this medicine or for 7 months after stopping it. Women must use effective birth control with this medicine. What side effects may I notice from receiving this medicine? Side effects that you should report to your doctor or health care professional as soon as possible: -allergic reactions like skin rash, itching or hives, swelling of the face, lips, or tongue -chest pain or palpitations -cough -dizziness -feeling faint or lightheaded, falls -fever -general ill feeling or flu-like symptoms -signs of worsening heart failure like breathing problems; swelling in your legs and feet -unusually weak or tired Side effects that usually do not require medical   attention (report to your doctor or health care professional if they continue or are bothersome): -bone pain -changes in taste -diarrhea -joint pain -nausea/vomiting -weight loss This list may not describe all  possible side effects. Call your doctor for medical advice about side effects. You may report side effects to FDA at 1-800-FDA-1088. Where should I keep my medicine? This drug is given in a hospital or clinic and will not be stored at home. NOTE: This sheet is a summary. It may not cover all possible information. If you have questions about this medicine, talk to your doctor, pharmacist, or health care provider.  2018 Elsevier/Gold Standard (2016-11-04 14:37:52)  

## 2018-10-28 LAB — IGG, IGA, IGM
IgA: 159 mg/dL (ref 87–352)
IgG (Immunoglobin G), Serum: 966 mg/dL (ref 700–1600)
IgM (Immunoglobulin M), Srm: 136 mg/dL (ref 26–217)

## 2018-10-28 LAB — KAPPA/LAMBDA LIGHT CHAINS
Kappa free light chain: 40.4 mg/L — ABNORMAL HIGH (ref 3.3–19.4)
Kappa, lambda light chain ratio: 1.72 — ABNORMAL HIGH (ref 0.26–1.65)
Lambda free light chains: 23.5 mg/L (ref 5.7–26.3)

## 2018-10-29 ENCOUNTER — Telehealth: Payer: Self-pay | Admitting: *Deleted

## 2018-10-29 LAB — PROTEIN ELECTROPHORESIS, SERUM, WITH REFLEX
A/G Ratio: 1.4 (ref 0.7–1.7)
Albumin ELP: 3.7 g/dL (ref 2.9–4.4)
Alpha-1-Globulin: 0.1 g/dL (ref 0.0–0.4)
Alpha-2-Globulin: 0.7 g/dL (ref 0.4–1.0)
Beta Globulin: 1 g/dL (ref 0.7–1.3)
Gamma Globulin: 0.9 g/dL (ref 0.4–1.8)
Globulin, Total: 2.7 g/dL (ref 2.2–3.9)
Total Protein ELP: 6.4 g/dL (ref 6.0–8.5)

## 2018-10-29 NOTE — Telephone Encounter (Addendum)
Patient is aware of results.  ----- Message from [name], MD sent at 10/28/2018  5:38 PM EST ----- Call - the myeloma protein is still low!!  pete

## 2018-11-03 ENCOUNTER — Other Ambulatory Visit: Payer: Self-pay | Admitting: Family

## 2018-11-03 DIAGNOSIS — D6959 Other secondary thrombocytopenia: Secondary | ICD-10-CM

## 2018-11-03 DIAGNOSIS — Z8673 Personal history of transient ischemic attack (TIA), and cerebral infarction without residual deficits: Secondary | ICD-10-CM

## 2018-11-03 DIAGNOSIS — T50905A Adverse effect of unspecified drugs, medicaments and biological substances, initial encounter: Secondary | ICD-10-CM

## 2018-11-03 DIAGNOSIS — D649 Anemia, unspecified: Secondary | ICD-10-CM

## 2018-11-10 ENCOUNTER — Other Ambulatory Visit: Payer: 59

## 2018-11-10 ENCOUNTER — Ambulatory Visit: Payer: 59

## 2018-11-10 ENCOUNTER — Ambulatory Visit: Payer: 59 | Admitting: Hematology & Oncology

## 2018-11-25 ENCOUNTER — Other Ambulatory Visit: Payer: 59

## 2018-11-25 ENCOUNTER — Ambulatory Visit: Payer: 59 | Admitting: Hematology & Oncology

## 2018-11-25 ENCOUNTER — Ambulatory Visit: Payer: 59

## 2018-11-27 ENCOUNTER — Telehealth: Payer: Self-pay | Admitting: Hematology & Oncology

## 2018-11-27 NOTE — Telephone Encounter (Signed)
MEDICAL RECORDS REQUEST  RECORDS FAXED TO:  [facility] Esperanza Sheets RN [facility] FAX # [phone] PHONE # [phone] X [phone]  REQUEST INDEXED

## 2018-11-30 ENCOUNTER — Telehealth: Payer: Self-pay | Admitting: *Deleted

## 2018-11-30 NOTE — Telephone Encounter (Signed)
Received call from patient stating,"I have an appointment tomorrow and I have a cold. Is it OK if I come in wearing a mask?" Per Laverna Peace, NP, cancel appointments for tomorrow and have her call us to reschedule when she feels better. She verbalized understanding.

## 2018-12-01 ENCOUNTER — Other Ambulatory Visit: Payer: 59

## 2018-12-01 ENCOUNTER — Ambulatory Visit: Payer: 59

## 2018-12-01 ENCOUNTER — Ambulatory Visit: Payer: 59 | Admitting: Hematology & Oncology

## 2018-12-08 ENCOUNTER — Other Ambulatory Visit: Payer: Self-pay

## 2018-12-08 ENCOUNTER — Encounter: Payer: Self-pay | Admitting: Family

## 2018-12-08 ENCOUNTER — Inpatient Hospital Stay: Payer: 59

## 2018-12-08 ENCOUNTER — Inpatient Hospital Stay (HOSPITAL_BASED_OUTPATIENT_CLINIC_OR_DEPARTMENT_OTHER): Payer: 59 | Admitting: Family

## 2018-12-08 ENCOUNTER — Inpatient Hospital Stay: Payer: 59 | Attending: Hematology & Oncology

## 2018-12-08 VITALS — BP 129/75 | HR 76 | Temp 98.4°F | Resp 16 | Wt 204.0 lb

## 2018-12-08 DIAGNOSIS — Z9484 Stem cells transplant status: Secondary | ICD-10-CM | POA: Diagnosis not present

## 2018-12-08 DIAGNOSIS — Z5112 Encounter for antineoplastic immunotherapy: Secondary | ICD-10-CM | POA: Diagnosis not present

## 2018-12-08 DIAGNOSIS — C50911 Malignant neoplasm of unspecified site of right female breast: Secondary | ICD-10-CM | POA: Diagnosis not present

## 2018-12-08 DIAGNOSIS — Z9221 Personal history of antineoplastic chemotherapy: Secondary | ICD-10-CM

## 2018-12-08 DIAGNOSIS — C9001 Multiple myeloma in remission: Secondary | ICD-10-CM

## 2018-12-08 DIAGNOSIS — Z923 Personal history of irradiation: Secondary | ICD-10-CM | POA: Diagnosis not present

## 2018-12-08 LAB — CMP (CANCER CENTER ONLY)
ALT: 12 U/L (ref 0–44)
ANION GAP: 9 (ref 5–15)
AST: 14 U/L — ABNORMAL LOW (ref 15–41)
Albumin: 4.1 g/dL (ref 3.5–5.0)
Alkaline Phosphatase: 59 U/L (ref 38–126)
BUN: 28 mg/dL — ABNORMAL HIGH (ref 8–23)
CHLORIDE: 102 mmol/L (ref 98–111)
CO2: 26 mmol/L (ref 22–32)
Calcium: 9.1 mg/dL (ref 8.9–10.3)
Creatinine: 1.54 mg/dL — ABNORMAL HIGH (ref 0.44–1.00)
GFR, Est AFR Am: 39 mL/min — ABNORMAL LOW (ref >60)
GFR, Estimated: 34 mL/min — ABNORMAL LOW (ref >60)
Glucose, Bld: 100 mg/dL — ABNORMAL HIGH (ref 70–99)
Potassium: 3.8 mmol/L (ref 3.5–5.1)
Sodium: 137 mmol/L (ref 135–145)
Total Bilirubin: 0.5 mg/dL (ref 0.3–1.2)
Total Protein: 6.9 g/dL (ref 6.5–8.1)

## 2018-12-08 LAB — CBC WITH DIFFERENTIAL (CANCER CENTER ONLY)
Abs Immature Granulocytes: 0.03 K/uL (ref 0.00–0.07)
Basophils Absolute: 0 K/uL (ref 0.0–0.1)
Basophils Relative: 1 %
Eosinophils Absolute: 0.2 K/uL (ref 0.0–0.5)
Eosinophils Relative: 2 %
HCT: 40.6 % (ref 36.0–46.0)
Hemoglobin: 13.5 g/dL (ref 12.0–15.0)
Immature Granulocytes: 1 %
Lymphocytes Relative: 39 %
Lymphs Abs: 2.6 K/uL (ref 0.7–4.0)
MCH: 33.3 pg (ref 26.0–34.0)
MCHC: 33.3 g/dL (ref 30.0–36.0)
MCV: 100 fL (ref 80.0–100.0)
Monocytes Absolute: 0.5 K/uL (ref 0.1–1.0)
Monocytes Relative: 7 %
Neutro Abs: 3.3 K/uL (ref 1.7–7.7)
Neutrophils Relative %: 50 %
Platelet Count: 196 K/uL (ref 150–400)
RBC: 4.06 MIL/uL (ref 3.87–5.11)
RDW: 12.4 % (ref 11.5–15.5)
WBC Count: 6.6 K/uL (ref 4.0–10.5)
nRBC: 0 % (ref 0.0–0.2)

## 2018-12-08 LAB — LACTATE DEHYDROGENASE: LDH: 138 U/L (ref 98–192)

## 2018-12-08 MED ORDER — SODIUM CHLORIDE 0.9% FLUSH
10.0000 mL | INTRAVENOUS | Status: DC | PRN
  Administered 2018-12-08: 10 mL
  Filled 2018-12-08: qty 10

## 2018-12-08 MED ORDER — DIPHENHYDRAMINE HCL 25 MG PO CAPS
50.0000 mg | ORAL_CAPSULE | Freq: Once | ORAL | Status: AC
  Administered 2018-12-08: 50 mg via ORAL

## 2018-12-08 MED ORDER — ANTICOAGULANT SODIUM CITRATE 4% (200MG/5ML) IV SOLN
5.0000 mL | Freq: Once | Status: DC
  Filled 2018-12-08: qty 5

## 2018-12-08 MED ORDER — ACETAMINOPHEN 325 MG PO TABS
650.0000 mg | ORAL_TABLET | Freq: Once | ORAL | Status: AC
  Administered 2018-12-08: 650 mg via ORAL

## 2018-12-08 MED ORDER — AMOXICILLIN 500 MG PO TABS: 2000.0000 mg | ORAL_TABLET | Freq: Once | ORAL | 0 refills | Status: DC

## 2018-12-08 MED ORDER — TRASTUZUMAB CHEMO 150 MG IV SOLR
6.0000 mg/kg | Freq: Once | INTRAVENOUS | Status: AC
  Administered 2018-12-08: 525 mg via INTRAVENOUS
  Filled 2018-12-08: qty 25

## 2018-12-08 MED ORDER — SODIUM CHLORIDE 0.9 % IV SOLN
INTRAVENOUS | Status: DC
  Administered 2018-12-08: 12:00:00 via INTRAVENOUS
  Filled 2018-12-08: qty 250

## 2018-12-08 NOTE — Progress Notes (Signed)
Hematology and Oncology Follow Up Visit  Marcia [name] [id number] 18-Mar-1951 68 y.o. 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 She is doing well and has no complaints at this time.  Breast exam today was negative. She has some hyper pigmentation at the 7 o'clock position of the right breast from radiation but this continues to fade nicely. Lumpectomy scar intact. No redness, edema or skin breakdown at the site. She is using Eucerin cream daily.  She states that she will see Marcia [name] again in March and plans to call and see if she needs her mammogram prior to her visit.  She recently had some sinus congestion and cough but her symptoms have almost completely resolved.  No fever, chills, n/v, cough, rash, dizziness, SOB, chest pain, palpitations, abdominal pain or changes in bowel or bladder habits.  No swelling, tenderness, numbness or tingling in her extremities at this time.  No lymphadenopathy noted on exam.  She has maintained a good appetite and is staying well hydrated. Her weight is stable.   ECOG Performance Status: 1 - Symptomatic but completely ambulatory  Medications:  Allergies as of 12/08/2018      Reactions   Bactrim [sulfamethoxazole-trimethoprim] Other (See Comments)   ? Renal failure.   Heparin    HIT   Zofran [ondansetron] Shortness Of Breath   "felt like throat was closing"   Clarithromycin Diarrhea, Nausea And Vomiting   Latex Rash   Macrodantin [nitrofurantoin] Rash      Medication List       Accurate as of December 08, 2018 11:06 AM. Always use your most recent med list.        acetaminophen 325 MG tablet Commonly known as:  TYLENOL Take 650 mg by mouth daily as needed for moderate pain or headache.   apixaban 5 MG Tabs tablet Commonly known as:  ELIQUIS Take 1 tablet (5 mg total) by mouth 2 (two) times daily.   doxylamine (Sleep) 25 MG tablet Commonly known as:  UNISOM Take 25 mg by mouth at bedtime as needed for sleep.   feeding supplement (ENSURE ENLIVE) Liqd Take 237 mLs by mouth 3 (three) times daily between meals.   folic acid 1 MG tablet Commonly known as:  FOLVITE Take 2 tablets (2 mg total) by mouth daily.   lidocaine-prilocaine cream Commonly known as:  EMLA Apply 1 application topically as needed (local anesthetic).       Allergies:  Allergies  Allergen Reactions  . Bactrim [Sulfamethoxazole-Trimethoprim] Other (See Comments)    ? Renal failure.  . Heparin     HIT  . Zofran [Ondansetron] Shortness Of Breath    "felt like throat was closing"  . Clarithromycin Diarrhea and Nausea And Vomiting  . Latex Rash  . Macrodantin [Nitrofurantoin] Rash    Past Medical History, Surgical history, Social history, and Family History were reviewed and updated.  Review of Systems: All other 10 point review of systems is negative.   Physical Exam:  vitals were not taken for  this visit.   Wt Readings from Last 3 Encounters:  10/27/18 201 lb 1.6 oz (91.2 kg)  10/26/18 201 lb (91.2 kg)  09/29/18 199 lb (90.3 kg)    Ocular: Sclerae unicteric, pupils equal, round and reactive to light Ear-nose-throat: Oropharynx clear, dentition fair Lymphatic: No cervical, supraclavicular or axillary adenopathy Lungs no rales or rhonchi, good excursion bilaterally Heart regular rate and rhythm, no murmur appreciated Abd soft, nontender,  positive bowel sounds, no liver or spleen tip palpated on exam, no fluid wave  MSK no focal spinal tenderness, no joint edema Neuro: non-focal, well-oriented, appropriate affect Breasts: No changes noted on today's exam. She has hyper pigmentation at the 7 o'clock position of the right breast from radiation but this continues to fade nicely. Lumpectomy scar intact. No mass, lesion or rash noted.   Lab Results  Component Value Date   WBC 4.7 10/27/2018   HGB 12.8 10/27/2018   HCT 38.7 10/27/2018   MCV 100.8 (H) 10/27/2018   PLT 161 10/27/2018   Lab Results  Component Value Date   FERRITIN 820 (H) 05/17/2018   IRON 272 (H) 05/17/2018   TIBC 295 05/17/2018   UIBC 23 05/17/2018   IRONPCTSAT 92 (H) 05/17/2018   Lab Results  Component Value Date   RETICCTPCT 1.3 03/27/2011   RBC 3.84 (L) 10/27/2018   RETICCTABS 56.4 03/27/2011   Lab Results  Component Value Date   KPAFRELGTCHN 40.4 (H) 10/27/2018   LAMBDASER 23.5 10/27/2018   KAPLAMBRATIO 1.72 (H) 10/27/2018   Lab Results  Component Value Date   IGGSERUM 966 10/27/2018   IGA 159 10/27/2018   IGMSERUM 136 10/27/2018   Lab Results  Component Value Date   TOTALPROTELP 6.4 10/27/2018   ALBUMINELP 3.7 10/27/2018   A1GS 0.1 10/27/2018   A2GS 0.7 10/27/2018   BETS 1.0 10/27/2018   BETA2SER 0.4 07/02/2015   GAMS 0.9 10/27/2018   MSPIKE Not Observed 10/27/2018   SPEI Comment 03/30/2018     Chemistry      Component Value Date/Time   NA 140 10/27/2018 1125   NA 141 07/06/2017 0816   NA 141 01/05/2017 0956   K 3.8 10/27/2018 1125   K 3.6 07/06/2017 0816   K 4.3 01/05/2017 0956   CL 106 10/27/2018 1125   CL 101 07/06/2017 0816   CO2 24 10/27/2018 1125   CO2 28 07/06/2017 0816   CO2 23 01/05/2017 0956   BUN 24 (H) 10/27/2018 1125   BUN 28 (H) 07/06/2017 0816   BUN 22.4 01/05/2017 0956   CREATININE 1.41 (H) 10/27/2018 1125   CREATININE 1.6 (H) 07/06/2017 0816   CREATININE 1.4 (H) 01/05/2017 0956      Component Value  Date/Time   CALCIUM 8.8 (L) 10/27/2018 1125   CALCIUM 9.1 07/06/2017 0816   CALCIUM 9.3 01/05/2017 0956   ALKPHOS 67 10/27/2018 1125   ALKPHOS 58 07/06/2017 0816   ALKPHOS 52 01/05/2017 0956   AST 15 10/27/2018 1125   AST 14 01/05/2017 0956   ALT 15 10/27/2018 1125   ALT 58 (H) 07/06/2017 0816   ALT 13 01/05/2017 0956   BILITOT 0.5 10/27/2018 1125   BILITOT 0.56 01/05/2017 0956       Impression and Plan: Ms. Kindig is a very pleasant 67 yo caucasian female with post history of kappa light chain myeloma with stem cell transplant. She has now been diagnosed with locally advanced infiltratin ductal carcinoma of the right breast, ER (-)/PR(-)/HER-2(+), Ki67 of 75%. She continues to do  well and we will proceed with treatment today as planned.  She will be having her teeth cleaned in a few weeks so she was given a prescription for Amoxicillin as prophylaxis to take 2 hours prior to the cleaning.  We will see her in another 3 weeks.  She will contact our office with any questions or concerns. We can certainly see her sooner if need be.   Laverna Peace, NP 1/15/202011:06 AM

## 2018-12-08 NOTE — Patient Instructions (Signed)
Implanted Port Insertion, Care After  This sheet gives you information about how to care for yourself after your procedure. Your health care provider may also give you more specific instructions. If you have problems or questions, contact your health care provider.  What can I expect after the procedure?  After the procedure, it is common to have:  · Discomfort at the port insertion site.  · Bruising on the skin over the port. This should improve over 3-4 days.  Follow these instructions at home:  Port care  · After your port is placed, you will get a manufacturer's information card. The card has information about your port. Keep this card with you at all times.  · Take care of the port as told by your health care provider. Ask your health care provider if you or a family member can get training for taking care of the port at home. A home health care nurse may also take care of the port.  · Make sure to remember what type of port you have.  Incision care         · Follow instructions from your health care provider about how to take care of your port insertion site. Make sure you:  ? Wash your hands with soap and water before and after you change your bandage (dressing). If soap and water are not available, use hand sanitizer.  ? Change your dressing as told by your health care provider.  ? Leave stitches (sutures), skin glue, or adhesive strips in place. These skin closures may need to stay in place for 2 weeks or longer. If adhesive strip edges start to loosen and curl up, you may trim the loose edges. Do not remove adhesive strips completely unless your health care provider tells you to do that.  · Check your port insertion site every day for signs of infection. Check for:  ? Redness, swelling, or pain.  ? Fluid or blood.  ? Warmth.  ? Pus or a bad smell.  Activity  · Return to your normal activities as told by your health care provider. Ask your health care provider what activities are safe for you.  · Do not  lift anything that is heavier than 10 lb (4.5 kg), or the limit that you are told, until your health care provider says that it is safe.  General instructions  · Take over-the-counter and prescription medicines only as told by your health care provider.  · Do not take baths, swim, or use a hot tub until your health care provider approves. Ask your health care provider if you may take showers. You may only be allowed to take sponge baths.  · Do not drive for 24 hours if you were given a sedative during your procedure.  · Wear a medical alert bracelet in case of an emergency. This will tell any health care providers that you have a port.  · Keep all follow-up visits as told by your health care provider. This is important.  Contact a health care provider if:  · You cannot flush your port with saline as directed, or you cannot draw blood from the port.  · You have a fever or chills.  · You have redness, swelling, or pain around your port insertion site.  · You have fluid or blood coming from your port insertion site.  · Your port insertion site feels warm to the touch.  · You have pus or a bad smell coming from the port   insertion site.  Get help right away if:  · You have chest pain or shortness of breath.  · You have bleeding from your port that you cannot control.  Summary  · Take care of the port as told by your health care provider. Keep the manufacturer's information card with you at all times.  · Change your dressing as told by your health care provider.  · Contact a health care provider if you have a fever or chills or if you have redness, swelling, or pain around your port insertion site.  · Keep all follow-up visits as told by your health care provider.  This information is not intended to replace advice given to you by your health care provider. Make sure you discuss any questions you have with your health care provider.  Document Released: 08/31/2013 Document Revised: 06/08/2018 Document Reviewed:  06/08/2018  Elsevier Interactive Patient Education © 2019 Elsevier Inc[employer]

## 2018-12-09 LAB — PROTEIN ELECTROPHORESIS, SERUM, WITH REFLEX
A/G Ratio: 1.3 (ref 0.7–1.7)
Albumin ELP: 3.7 g/dL (ref 2.9–4.4)
Alpha-1-Globulin: 0.2 g/dL (ref 0.0–0.4)
Alpha-2-Globulin: 0.7 g/dL (ref 0.4–1.0)
Beta Globulin: 1.1 g/dL (ref 0.7–1.3)
GLOBULIN, TOTAL: 2.9 g/dL (ref 2.2–3.9)
Gamma Globulin: 1 g/dL (ref 0.4–1.8)
Total Protein ELP: 6.6 g/dL (ref 6.0–8.5)

## 2018-12-09 LAB — IGG, IGA, IGM
IgA: 177 mg/dL (ref 87–352)
IgG (Immunoglobin G), Serum: 1015 mg/dL (ref 700–1600)
IgM (Immunoglobulin M), Srm: 136 mg/dL (ref 26–217)

## 2018-12-09 LAB — CANCER ANTIGEN 27.29: CA 27.29: 35.2 U/mL (ref 0.0–38.6)

## 2018-12-09 LAB — KAPPA/LAMBDA LIGHT CHAINS
Kappa free light chain: 42.4 mg/L — ABNORMAL HIGH (ref 3.3–19.4)
Kappa, lambda light chain ratio: 1.79 — ABNORMAL HIGH (ref 0.26–1.65)
LAMDA FREE LIGHT CHAINS: 23.7 mg/L (ref 5.7–26.3)

## 2018-12-14 ENCOUNTER — Other Ambulatory Visit: Payer: Self-pay | Admitting: General Surgery

## 2018-12-14 DIAGNOSIS — Z9889 Other specified postprocedural states: Secondary | ICD-10-CM

## 2018-12-14 DIAGNOSIS — Z853 Personal history of malignant neoplasm of breast: Secondary | ICD-10-CM

## 2018-12-15 ENCOUNTER — Other Ambulatory Visit: Payer: Self-pay

## 2018-12-15 ENCOUNTER — Other Ambulatory Visit: Payer: Self-pay | Admitting: General Surgery

## 2018-12-15 DIAGNOSIS — Z853 Personal history of malignant neoplasm of breast: Secondary | ICD-10-CM

## 2018-12-15 DIAGNOSIS — Z9889 Other specified postprocedural states: Secondary | ICD-10-CM

## 2018-12-23 ENCOUNTER — Other Ambulatory Visit: Payer: Self-pay | Admitting: Family

## 2018-12-23 DIAGNOSIS — C9001 Multiple myeloma in remission: Secondary | ICD-10-CM

## 2018-12-23 DIAGNOSIS — C50911 Malignant neoplasm of unspecified site of right female breast: Secondary | ICD-10-CM

## 2018-12-29 ENCOUNTER — Inpatient Hospital Stay: Payer: 59

## 2018-12-29 ENCOUNTER — Inpatient Hospital Stay: Payer: 59 | Attending: Hematology & Oncology

## 2018-12-29 VITALS — BP 136/71 | HR 87 | Temp 98.0°F | Resp 16

## 2018-12-29 DIAGNOSIS — C9001 Multiple myeloma in remission: Secondary | ICD-10-CM | POA: Diagnosis not present

## 2018-12-29 DIAGNOSIS — Z86718 Personal history of other venous thrombosis and embolism: Secondary | ICD-10-CM | POA: Diagnosis not present

## 2018-12-29 DIAGNOSIS — Z79899 Other long term (current) drug therapy: Secondary | ICD-10-CM | POA: Insufficient documentation

## 2018-12-29 DIAGNOSIS — Z923 Personal history of irradiation: Secondary | ICD-10-CM | POA: Insufficient documentation

## 2018-12-29 DIAGNOSIS — Z86711 Personal history of pulmonary embolism: Secondary | ICD-10-CM | POA: Diagnosis not present

## 2018-12-29 DIAGNOSIS — Z5112 Encounter for antineoplastic immunotherapy: Secondary | ICD-10-CM | POA: Insufficient documentation

## 2018-12-29 DIAGNOSIS — C50411 Malignant neoplasm of upper-outer quadrant of right female breast: Secondary | ICD-10-CM | POA: Diagnosis not present

## 2018-12-29 DIAGNOSIS — Z171 Estrogen receptor negative status [ER-]: Secondary | ICD-10-CM | POA: Diagnosis not present

## 2018-12-29 DIAGNOSIS — C50911 Malignant neoplasm of unspecified site of right female breast: Secondary | ICD-10-CM

## 2018-12-29 DIAGNOSIS — Z9484 Stem cells transplant status: Secondary | ICD-10-CM | POA: Insufficient documentation

## 2018-12-29 DIAGNOSIS — Z7901 Long term (current) use of anticoagulants: Secondary | ICD-10-CM | POA: Insufficient documentation

## 2018-12-29 LAB — CBC WITH DIFFERENTIAL (CANCER CENTER ONLY)
Abs Immature Granulocytes: 0.04 10*3/uL (ref 0.00–0.07)
Basophils Absolute: 0 10*3/uL (ref 0.0–0.1)
Basophils Relative: 0 %
EOS PCT: 2 %
Eosinophils Absolute: 0.1 10*3/uL (ref 0.0–0.5)
HCT: 39.6 % (ref 36.0–46.0)
Hemoglobin: 13.2 g/dL (ref 12.0–15.0)
Immature Granulocytes: 1 %
Lymphocytes Relative: 39 %
Lymphs Abs: 3 10*3/uL (ref 0.7–4.0)
MCH: 33.4 pg (ref 26.0–34.0)
MCHC: 33.3 g/dL (ref 30.0–36.0)
MCV: 100.3 fL — AB (ref 80.0–100.0)
MONO ABS: 0.5 10*3/uL (ref 0.1–1.0)
Monocytes Relative: 7 %
Neutro Abs: 3.9 10*3/uL (ref 1.7–7.7)
Neutrophils Relative %: 51 %
Platelet Count: 213 10*3/uL (ref 150–400)
RBC: 3.95 MIL/uL (ref 3.87–5.11)
RDW: 12.4 % (ref 11.5–15.5)
WBC Count: 7.6 10*3/uL (ref 4.0–10.5)
nRBC: 0 % (ref 0.0–0.2)

## 2018-12-29 LAB — CMP (CANCER CENTER ONLY)
ALT: 13 U/L (ref 0–44)
AST: 15 U/L (ref 15–41)
Albumin: 4.1 g/dL (ref 3.5–5.0)
Alkaline Phosphatase: 68 U/L (ref 38–126)
Anion gap: 9 (ref 5–15)
BUN: 18 mg/dL (ref 8–23)
CO2: 25 mmol/L (ref 22–32)
Calcium: 9.3 mg/dL (ref 8.9–10.3)
Chloride: 104 mmol/L (ref 98–111)
Creatinine: 1.42 mg/dL — ABNORMAL HIGH (ref 0.44–1.00)
GFR, Est AFR Am: 44 mL/min — ABNORMAL LOW (ref >60)
GFR, Estimated: 38 mL/min — ABNORMAL LOW (ref >60)
Glucose, Bld: 92 mg/dL (ref 70–99)
Potassium: 3.6 mmol/L (ref 3.5–5.1)
Sodium: 138 mmol/L (ref 135–145)
Total Bilirubin: 0.5 mg/dL (ref 0.3–1.2)
Total Protein: 6.7 g/dL (ref 6.5–8.1)

## 2018-12-29 LAB — LACTATE DEHYDROGENASE: LDH: 149 U/L (ref 98–192)

## 2018-12-29 MED ORDER — SODIUM CHLORIDE 0.9 % IV SOLN
Freq: Once | INTRAVENOUS | Status: AC
  Administered 2018-12-29: 12:00:00 via INTRAVENOUS
  Filled 2018-12-29: qty 250

## 2018-12-29 MED ORDER — DIPHENHYDRAMINE HCL 25 MG PO CAPS
50.0000 mg | ORAL_CAPSULE | Freq: Once | ORAL | Status: AC
  Administered 2018-12-29: 50 mg via ORAL

## 2018-12-29 MED ORDER — DIPHENHYDRAMINE HCL 25 MG PO CAPS
ORAL_CAPSULE | ORAL | Status: AC
  Filled 2018-12-29: qty 2

## 2018-12-29 MED ORDER — ACETAMINOPHEN 325 MG PO TABS
ORAL_TABLET | ORAL | Status: AC
  Filled 2018-12-29: qty 2

## 2018-12-29 MED ORDER — ANTICOAGULANT SODIUM CITRATE 4% (200MG/5ML) IV SOLN
5.0000 mL | Freq: Once | Status: DC
  Administered 2018-12-29: 5 mL via INTRAVENOUS
  Filled 2018-12-29: qty 5

## 2018-12-29 MED ORDER — ACETAMINOPHEN 325 MG PO TABS
650.0000 mg | ORAL_TABLET | Freq: Once | ORAL | Status: AC
  Administered 2018-12-29: 650 mg via ORAL

## 2018-12-29 MED ORDER — SODIUM CHLORIDE 0.9% FLUSH
10.0000 mL | INTRAVENOUS | Status: DC | PRN
  Filled 2018-12-29: qty 10

## 2018-12-29 MED ORDER — TRASTUZUMAB CHEMO 150 MG IV SOLR
6.0000 mg/kg | Freq: Once | INTRAVENOUS | Status: AC
  Administered 2018-12-29: 525 mg via INTRAVENOUS
  Filled 2018-12-29: qty 25

## 2018-12-29 NOTE — Patient Instructions (Signed)
Implanted Port Insertion, Care After  This sheet gives you information about how to care for yourself after your procedure. Your health care provider may also give you more specific instructions. If you have problems or questions, contact your health care provider.  What can I expect after the procedure?  After the procedure, it is common to have:  · Discomfort at the port insertion site.  · Bruising on the skin over the port. This should improve over 3-4 days.  Follow these instructions at home:  Port care  · After your port is placed, you will get a manufacturer's information card. The card has information about your port. Keep this card with you at all times.  · Take care of the port as told by your health care provider. Ask your health care provider if you or a family member can get training for taking care of the port at home. A home health care nurse may also take care of the port.  · Make sure to remember what type of port you have.  Incision care         · Follow instructions from your health care provider about how to take care of your port insertion site. Make sure you:  ? Wash your hands with soap and water before and after you change your bandage (dressing). If soap and water are not available, use hand sanitizer.  ? Change your dressing as told by your health care provider.  ? Leave stitches (sutures), skin glue, or adhesive strips in place. These skin closures may need to stay in place for 2 weeks or longer. If adhesive strip edges start to loosen and curl up, you may trim the loose edges. Do not remove adhesive strips completely unless your health care provider tells you to do that.  · Check your port insertion site every day for signs of infection. Check for:  ? Redness, swelling, or pain.  ? Fluid or blood.  ? Warmth.  ? Pus or a bad smell.  Activity  · Return to your normal activities as told by your health care provider. Ask your health care provider what activities are safe for you.  · Do not  lift anything that is heavier than 10 lb (4.5 kg), or the limit that you are told, until your health care provider says that it is safe.  General instructions  · Take over-the-counter and prescription medicines only as told by your health care provider.  · Do not take baths, swim, or use a hot tub until your health care provider approves. Ask your health care provider if you may take showers. You may only be allowed to take sponge baths.  · Do not drive for 24 hours if you were given a sedative during your procedure.  · Wear a medical alert bracelet in case of an emergency. This will tell any health care providers that you have a port.  · Keep all follow-up visits as told by your health care provider. This is important.  Contact a health care provider if:  · You cannot flush your port with saline as directed, or you cannot draw blood from the port.  · You have a fever or chills.  · You have redness, swelling, or pain around your port insertion site.  · You have fluid or blood coming from your port insertion site.  · Your port insertion site feels warm to the touch.  · You have pus or a bad smell coming from the port   insertion site.  Get help right away if:  · You have chest pain or shortness of breath.  · You have bleeding from your port that you cannot control.  Summary  · Take care of the port as told by your health care provider. Keep the manufacturer's information card with you at all times.  · Change your dressing as told by your health care provider.  · Contact a health care provider if you have a fever or chills or if you have redness, swelling, or pain around your port insertion site.  · Keep all follow-up visits as told by your health care provider.  This information is not intended to replace advice given to you by your health care provider. Make sure you discuss any questions you have with your health care provider.  Document Released: 08/31/2013 Document Revised: 06/08/2018 Document Reviewed:  06/08/2018  Elsevier Interactive Patient Education © 2019 Elsevier Inc[employer]

## 2018-12-29 NOTE — Patient Instructions (Signed)
Trastuzumab injection for infusion What is this medicine? TRASTUZUMAB (tras TOO zoo mab) is a monoclonal antibody. It is used to treat breast cancer and stomach cancer. This medicine may be used for other purposes; ask your health care provider or pharmacist if you have questions. COMMON BRAND NAME(S): Herceptin, KANJINTI, OGIVRI What should I tell my health care provider before I take this medicine? They need to know if you have any of these conditions: -heart disease -heart failure -lung or breathing disease, like asthma -an unusual or allergic reaction to trastuzumab, benzyl alcohol, or other medications, foods, dyes, or preservatives -pregnant or trying to get pregnant -breast-feeding How should I use this medicine? This drug is given as an infusion into a vein. It is administered in a hospital or clinic by a specially trained health care professional. Talk to your pediatrician regarding the use of this medicine in children. This medicine is not approved for use in children. Overdosage: If you think you have taken too much of this medicine contact a poison control center or emergency room at once. NOTE: This medicine is only for you. Do not share this medicine with others. What if I miss a dose? It is important not to miss a dose. Call your doctor or health care professional if you are unable to keep an appointment. What may interact with this medicine? This medicine may interact with the following medications: -certain types of chemotherapy, such as daunorubicin, doxorubicin, epirubicin, and idarubicin This list may not describe all possible interactions. Give your health care provider a list of all the medicines, herbs, non-prescription drugs, or dietary supplements you use. Also tell them if you smoke, drink alcohol, or use illegal drugs. Some items may interact with your medicine. What should I watch for while using this medicine? Visit your doctor for checks on your progress. Report  any side effects. Continue your course of treatment even though you feel ill unless your doctor tells you to stop. Call your doctor or health care professional for advice if you get a fever, chills or sore throat, or other symptoms of a cold or flu. Do not treat yourself. Try to avoid being around people who are sick. You may experience fever, chills and shaking during your first infusion. These effects are usually mild and can be treated with other medicines. Report any side effects during the infusion to your health care professional. Fever and chills usually do not happen with later infusions. Do not become pregnant while taking this medicine or for 7 months after stopping it. Women should inform their doctor if they wish to become pregnant or think they might be pregnant. Women of child-bearing potential will need to have a negative pregnancy test before starting this medicine. There is a potential for serious side effects to an unborn child. Talk to your health care professional or pharmacist for more information. Do not breast-feed an infant while taking this medicine or for 7 months after stopping it. Women must use effective birth control with this medicine. What side effects may I notice from receiving this medicine? Side effects that you should report to your doctor or health care professional as soon as possible: -allergic reactions like skin rash, itching or hives, swelling of the face, lips, or tongue -chest pain or palpitations -cough -dizziness -feeling faint or lightheaded, falls -fever -general ill feeling or flu-like symptoms -signs of worsening heart failure like breathing problems; swelling in your legs and feet -unusually weak or tired Side effects that usually do not   require medical attention (report to your doctor or health care professional if they continue or are bothersome): -bone pain -changes in taste -diarrhea -joint pain -nausea/vomiting -weight loss This list may  not describe all possible side effects. Call your doctor for medical advice about side effects. You may report side effects to FDA at 1-800-FDA-1088. Where should I keep my medicine? This drug is given in a hospital or clinic and will not be stored at home. NOTE: This sheet is a summary. It may not cover all possible information. If you have questions about this medicine, talk to your doctor, pharmacist, or health care provider.  2019 Elsevier/Gold Standard (2016-11-04 14:37:52)  

## 2019-01-06 ENCOUNTER — Ambulatory Visit (HOSPITAL_BASED_OUTPATIENT_CLINIC_OR_DEPARTMENT_OTHER)
Admission: RE | Admit: 2019-01-06 | Discharge: 2019-01-06 | Disposition: A | Discharge status: Home / Self Care | Payer: 59 | Source: Ambulatory Visit | Attending: Family | Admitting: Family

## 2019-01-06 DIAGNOSIS — C50911 Malignant neoplasm of unspecified site of right female breast: Secondary | ICD-10-CM | POA: Diagnosis present

## 2019-01-06 DIAGNOSIS — C9001 Multiple myeloma in remission: Secondary | ICD-10-CM

## 2019-01-06 NOTE — Progress Notes (Signed)
  Echocardiogram 2D Echocardiogram has been performed.   Marcia [name] 01/06/2019, 2:07 PM

## 2019-01-06 NOTE — Progress Notes (Signed)
  Echocardiogram 2D Echocardiogram has been performed.  Marcia [name] 01/06/2019, 2:05 PM

## 2019-01-07 ENCOUNTER — Encounter: Payer: Self-pay | Admitting: *Deleted

## 2019-01-12 ENCOUNTER — Other Ambulatory Visit (HOSPITAL_BASED_OUTPATIENT_CLINIC_OR_DEPARTMENT_OTHER): Payer: 59

## 2019-01-12 ENCOUNTER — Other Ambulatory Visit (HOSPITAL_COMMUNITY): Payer: 59

## 2019-01-17 ENCOUNTER — Ambulatory Visit
Admission: RE | Admit: 2019-01-17 | Discharge: 2019-01-17 | Disposition: A | Discharge status: Home / Self Care | Payer: 59 | Source: Ambulatory Visit | Attending: General Surgery | Admitting: General Surgery

## 2019-01-17 DIAGNOSIS — Z9889 Other specified postprocedural states: Secondary | ICD-10-CM

## 2019-01-17 DIAGNOSIS — Z853 Personal history of malignant neoplasm of breast: Secondary | ICD-10-CM

## 2019-01-18 ENCOUNTER — Other Ambulatory Visit: Payer: Self-pay | Admitting: Family

## 2019-01-18 DIAGNOSIS — C9001 Multiple myeloma in remission: Secondary | ICD-10-CM

## 2019-01-18 DIAGNOSIS — C50911 Malignant neoplasm of unspecified site of right female breast: Secondary | ICD-10-CM

## 2019-01-19 ENCOUNTER — Inpatient Hospital Stay: Payer: 59

## 2019-01-19 ENCOUNTER — Inpatient Hospital Stay (HOSPITAL_BASED_OUTPATIENT_CLINIC_OR_DEPARTMENT_OTHER): Payer: 59 | Admitting: Family

## 2019-01-19 ENCOUNTER — Other Ambulatory Visit: Payer: Self-pay

## 2019-01-19 VITALS — BP 144/83 | HR 77 | Temp 98.2°F | Resp 18

## 2019-01-19 DIAGNOSIS — Z86718 Personal history of other venous thrombosis and embolism: Secondary | ICD-10-CM

## 2019-01-19 DIAGNOSIS — C9001 Multiple myeloma in remission: Secondary | ICD-10-CM

## 2019-01-19 DIAGNOSIS — Z171 Estrogen receptor negative status [ER-]: Secondary | ICD-10-CM | POA: Diagnosis not present

## 2019-01-19 DIAGNOSIS — Z9484 Stem cells transplant status: Secondary | ICD-10-CM | POA: Diagnosis not present

## 2019-01-19 DIAGNOSIS — C50911 Malignant neoplasm of unspecified site of right female breast: Secondary | ICD-10-CM

## 2019-01-19 DIAGNOSIS — Z923 Personal history of irradiation: Secondary | ICD-10-CM

## 2019-01-19 DIAGNOSIS — Z86711 Personal history of pulmonary embolism: Secondary | ICD-10-CM

## 2019-01-19 DIAGNOSIS — I2601 Septic pulmonary embolism with acute cor pulmonale: Secondary | ICD-10-CM

## 2019-01-19 DIAGNOSIS — C50411 Malignant neoplasm of upper-outer quadrant of right female breast: Secondary | ICD-10-CM

## 2019-01-19 DIAGNOSIS — Z79899 Other long term (current) drug therapy: Secondary | ICD-10-CM

## 2019-01-19 DIAGNOSIS — I824Z3 Acute embolism and thrombosis of unspecified deep veins of distal lower extremity, bilateral: Secondary | ICD-10-CM

## 2019-01-19 DIAGNOSIS — Z7901 Long term (current) use of anticoagulants: Secondary | ICD-10-CM

## 2019-01-19 LAB — CMP (CANCER CENTER ONLY)
ALT: 15 U/L (ref 0–44)
AST: 16 U/L (ref 15–41)
Albumin: 4.2 g/dL (ref 3.5–5.0)
Alkaline Phosphatase: 68 U/L (ref 38–126)
Anion gap: 10 (ref 5–15)
BUN: 30 mg/dL — ABNORMAL HIGH (ref 8–23)
CO2: 28 mmol/L (ref 22–32)
CREATININE: 1.48 mg/dL — AB (ref 0.44–1.00)
Calcium: 9.2 mg/dL (ref 8.9–10.3)
Chloride: 102 mmol/L (ref 98–111)
GFR, Est AFR Am: 41 mL/min — ABNORMAL LOW (ref >60)
GFR, Estimated: 36 mL/min — ABNORMAL LOW (ref >60)
Glucose, Bld: 94 mg/dL (ref 70–99)
Potassium: 4.3 mmol/L (ref 3.5–5.1)
Sodium: 140 mmol/L (ref 135–145)
Total Bilirubin: 0.5 mg/dL (ref 0.3–1.2)
Total Protein: 7.2 g/dL (ref 6.5–8.1)

## 2019-01-19 LAB — CBC WITH DIFFERENTIAL (CANCER CENTER ONLY)
ABS IMMATURE GRANULOCYTES: 0.03 10*3/uL (ref 0.00–0.07)
Basophils Absolute: 0 10*3/uL (ref 0.0–0.1)
Basophils Relative: 0 %
Eosinophils Absolute: 0.2 10*3/uL (ref 0.0–0.5)
Eosinophils Relative: 2 %
HCT: 41.1 % (ref 36.0–46.0)
Hemoglobin: 13.7 g/dL (ref 12.0–15.0)
Immature Granulocytes: 0 %
Lymphocytes Relative: 40 %
Lymphs Abs: 3.1 10*3/uL (ref 0.7–4.0)
MCH: 33.9 pg (ref 26.0–34.0)
MCHC: 33.3 g/dL (ref 30.0–36.0)
MCV: 101.7 fL — ABNORMAL HIGH (ref 80.0–100.0)
Monocytes Absolute: 0.5 10*3/uL (ref 0.1–1.0)
Monocytes Relative: 7 %
Neutro Abs: 3.8 10*3/uL (ref 1.7–7.7)
Neutrophils Relative %: 51 %
Platelet Count: 217 10*3/uL (ref 150–400)
RBC: 4.04 MIL/uL (ref 3.87–5.11)
RDW: 12.4 % (ref 11.5–15.5)
WBC Count: 7.6 10*3/uL (ref 4.0–10.5)
nRBC: 0 % (ref 0.0–0.2)

## 2019-01-19 LAB — LACTATE DEHYDROGENASE: LDH: 139 U/L (ref 98–192)

## 2019-01-19 MED ORDER — DIPHENHYDRAMINE HCL 25 MG PO CAPS
50.0000 mg | ORAL_CAPSULE | Freq: Once | ORAL | Status: AC
  Administered 2019-01-19: 25 mg via ORAL

## 2019-01-19 MED ORDER — SODIUM CHLORIDE 0.9 % IV SOLN
Freq: Once | INTRAVENOUS | Status: AC
  Administered 2019-01-19: 12:00:00 via INTRAVENOUS
  Filled 2019-01-19: qty 250

## 2019-01-19 MED ORDER — DIPHENHYDRAMINE HCL 25 MG PO CAPS
ORAL_CAPSULE | ORAL | Status: AC
  Filled 2019-01-19: qty 1

## 2019-01-19 MED ORDER — ACETAMINOPHEN 325 MG PO TABS
650.0000 mg | ORAL_TABLET | Freq: Once | ORAL | Status: AC
  Administered 2019-01-19: 650 mg via ORAL

## 2019-01-19 MED ORDER — TRASTUZUMAB CHEMO 150 MG IV SOLR
6.0000 mg/kg | Freq: Once | INTRAVENOUS | Status: AC
  Administered 2019-01-19: 525 mg via INTRAVENOUS
  Filled 2019-01-19: qty 25

## 2019-01-19 MED ORDER — ACETAMINOPHEN 325 MG PO TABS
ORAL_TABLET | ORAL | Status: AC
  Filled 2019-01-19: qty 2

## 2019-01-19 MED ORDER — APIXABAN 5 MG PO TABS: 5.0000 mg | ORAL_TABLET | Freq: Two times a day (BID) | ORAL | 6 refills | Status: DC

## 2019-01-19 MED ORDER — SODIUM CHLORIDE 0.9% FLUSH
10.0000 mL | INTRAVENOUS | Status: DC | PRN
  Administered 2019-01-19: 10 mL
  Filled 2019-01-19: qty 10

## 2019-01-19 MED ORDER — ANTICOAGULANT SODIUM CITRATE 4% (200MG/5ML) IV SOLN
5.0000 mL | Freq: Once | Status: AC
  Administered 2019-01-19: 5 mL via INTRAVENOUS
  Filled 2019-01-19: qty 5

## 2019-01-19 NOTE — Progress Notes (Signed)
Hematology and Oncology Follow Up Visit  Marcia [name] [id number] 13-Sep-1951 68 y.o. 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 She is doing well but is sad since her sweet cat Marcia [name] passed away last week. This has been hard for her and her other cat Marcia [name].  Her mammogram Monday was negative. Breast exam today showed no changes.  CA 27.29 was 35.2 last month.  No lymphadenopathy noted on exam.  No episodes of bleeding, no bruising or petechiae.  No fever, chills, n/v, cough, rash, dizziness, SOB, chest pain, palpitations, abdominal pain or changes in bowel or bladder habits.  The numbness and tingling in his toes comes and goes. No swelling or tenderness in her extremities at this time.  She is eating well and staying hydrated. Her weight is stable.  She is walking for exercise.   ECOG Performance Status: 1 - Symptomatic but completely ambulatory  Medications:  Allergies as of 01/19/2019      Reactions   Bactrim [sulfamethoxazole-trimethoprim] Other (See Comments)   ? Renal failure.   Heparin    HIT   Zofran [ondansetron] Shortness Of Breath   "felt like throat was closing"   Clarithromycin Diarrhea, Nausea And Vomiting   Latex Rash   Macrodantin [nitrofurantoin] Rash      Medication List       Accurate as of January 19, 2019 10:45 AM. Always use your most recent med list.        acetaminophen 325 MG  tablet Commonly known as:  TYLENOL Take 650 mg by mouth daily as needed for moderate pain or headache.   amoxicillin 500 MG capsule Commonly known as:  AMOXIL Prior to dentist   apixaban 5 MG Tabs tablet Commonly known as:  ELIQUIS Take 1 tablet (5 mg total) by mouth 2 (two) times daily.   doxylamine (Sleep) 25 MG tablet Commonly known as:  UNISOM Take 25 mg by mouth at bedtime as needed for sleep.   feeding supplement (ENSURE ENLIVE) Liqd Take 237 mLs by mouth 3 (three) times daily between meals.   folic acid 1 MG tablet Commonly known as:  FOLVITE Take 2 tablets (2 mg total) by mouth daily.   lidocaine-prilocaine cream Commonly known as:  EMLA Apply 1 application topically as needed (local anesthetic).   nystatin 100000 UNIT/ML suspension Commonly known as:  MYCOSTATIN       Allergies:  Allergies  Allergen Reactions  . Bactrim [Sulfamethoxazole-Trimethoprim] Other (See Comments)    ? Renal failure.  . Heparin     HIT  . Zofran [Ondansetron] Shortness Of Breath    "felt like throat was closing"  . Clarithromycin Diarrhea and Nausea And Vomiting  . Latex Rash  . Macrodantin [Nitrofurantoin] Rash    Past Medical History, Surgical history, Social history, and Family History were reviewed and updated.  Review of Systems: All other 10 point review of systems is negative.   Physical Exam:  oral temperature is 98.2 F (36.8 C). Her blood pressure is 144/83 (abnormal) and her pulse is  77. Her respiration is 18 and oxygen saturation is 96%.   Wt Readings from Last 3 Encounters:  12/08/18 204 lb (92.5 kg)  10/27/18 201 lb 1.6 oz (91.2 kg)  10/26/18 201 lb (91.2 kg)    Ocular: Sclerae unicteric, pupils equal, round and reactive to light Ear-nose-throat: Oropharynx clear, dentition fair Lymphatic: No cervical, supraclavicular or axillary adenopathy Lungs no rales or rhonchi, good excursion bilaterally Heart regular rate and rhythm, no murmur appreciated Abd  soft, nontender, positive bowel sounds, no liver or spleen tip palpated on exam, no fluid wave  MSK no focal spinal tenderness, no joint edema Neuro: non-focal, well-oriented, appropriate affect Breasts: Deferred   Lab Results  Component Value Date   WBC 7.6 01/19/2019   HGB 13.7 01/19/2019   HCT 41.1 01/19/2019   MCV 101.7 (H) 01/19/2019   PLT 217 01/19/2019   Lab Results  Component Value Date   FERRITIN 820 (H) 05/17/2018   IRON 272 (H) 05/17/2018   TIBC 295 05/17/2018   UIBC 23 05/17/2018   IRONPCTSAT 92 (H) 05/17/2018   Lab Results  Component Value Date   RETICCTPCT 1.3 03/27/2011   RBC 4.04 01/19/2019   RETICCTABS 56.4 03/27/2011   Lab Results  Component Value Date   KPAFRELGTCHN 42.4 (H) 12/08/2018   LAMBDASER 23.7 12/08/2018   KAPLAMBRATIO 1.79 (H) 12/08/2018   Lab Results  Component Value Date   IGGSERUM 1,015 12/08/2018   IGA 177 12/08/2018   IGMSERUM 136 12/08/2018   Lab Results  Component Value Date   TOTALPROTELP 6.6 12/08/2018   ALBUMINELP 3.7 12/08/2018   A1GS 0.2 12/08/2018   A2GS 0.7 12/08/2018   BETS 1.1 12/08/2018   BETA2SER 0.4 07/02/2015   GAMS 1.0 12/08/2018   MSPIKE Not Observed 12/08/2018   SPEI Comment 03/30/2018     Chemistry      Component Value Date/Time   NA 138 12/29/2018 1040   NA 141 07/06/2017 0816   NA 141 01/05/2017 0956   K 3.6 12/29/2018 1040   K 3.6 07/06/2017 0816   K 4.3 01/05/2017 0956   CL 104 12/29/2018 1040   CL 101 07/06/2017 0816   CO2 25 12/29/2018 1040   CO2 28 07/06/2017 0816   CO2 23 01/05/2017 0956   BUN 18 12/29/2018 1040   BUN 28 (H) 07/06/2017 0816   BUN 22.4 01/05/2017 0956   CREATININE 1.42 (H) 12/29/2018 1040   CREATININE 1.6 (H) 07/06/2017 0816   CREATININE 1.4 (H) 01/05/2017 0956      Component Value Date/Time   CALCIUM 9.3 12/29/2018 1040   CALCIUM 9.1 07/06/2017 0816   CALCIUM 9.3 01/05/2017 0956   ALKPHOS 68 12/29/2018 1040   ALKPHOS 58 07/06/2017 0816   ALKPHOS 52 01/05/2017  0956   AST 15 12/29/2018 1040   AST 14 01/05/2017 0956   ALT 13 12/29/2018 1040   ALT 58 (H) 07/06/2017 0816   ALT 13 01/05/2017 0956   BILITOT 0.5 12/29/2018 1040   BILITOT 0.56 01/05/2017 0956       Impression and Plan: Marcia [name] is a very pleasant 68 yo caucasian female with post history of kappa light chain myeloma with stem cell transplant. She has now been diagnosed with locally advanced infiltratin ductal carcinoma of the right breast, ER (-)/PR(-)/HER-2(+), Ki67 of 75%. She continues to do well and has no complaints at this time.  We will proceed with treatment today as planned.  We will plan to see her back in another 3 weeks.  She will contact our office with any questions or concerns. We can certainly see her sooner if need be.   Laverna Peace, NP 2/26/202010:45 AM

## 2019-01-19 NOTE — Addendum Note (Signed)
Addended by: Burney Gauze R on: 01/19/2019 11:34 AM   Modules accepted: Orders

## 2019-01-19 NOTE — Patient Instructions (Signed)
[facility] Discharge Instructions for Patients Receiving Chemotherapy  Today you received the following chemotherapy agents: Herceptin   To help prevent nausea and vomiting after your treatment, we encourage you to take your nausea medication as directed.    If you develop nausea and vomiting that is not controlled by your nausea medication, call the clinic.   BELOW ARE SYMPTOMS THAT SHOULD BE REPORTED IMMEDIATELY:  *FEVER GREATER THAN 100.5 F  *CHILLS WITH OR WITHOUT FEVER  NAUSEA AND VOMITING THAT IS NOT CONTROLLED WITH YOUR NAUSEA MEDICATION  *UNUSUAL SHORTNESS OF BREATH  *UNUSUAL BRUISING OR BLEEDING  TENDERNESS IN MOUTH AND THROAT WITH OR WITHOUT PRESENCE OF ULCERS  *URINARY PROBLEMS  *BOWEL PROBLEMS  UNUSUAL RASH Items with * indicate a potential emergency and should be followed up as soon as possible.  Feel free to call the clinic you have any questions or concerns. The clinic phone number is [phone].  Please show the CHEMO ALERT CARD at check-in to the Emergency Department and triage nurse.   

## 2019-01-20 LAB — CANCER ANTIGEN 27.29: CA 27.29: 41.3 U/mL — ABNORMAL HIGH (ref 0.0–38.6)

## 2019-02-09 ENCOUNTER — Other Ambulatory Visit: Payer: Self-pay | Admitting: Hematology & Oncology

## 2019-02-09 ENCOUNTER — Inpatient Hospital Stay: Payer: 59

## 2019-02-09 ENCOUNTER — Inpatient Hospital Stay: Payer: 59 | Admitting: Family

## 2019-02-18 ENCOUNTER — Encounter: Payer: Self-pay | Admitting: Family

## 2019-02-18 ENCOUNTER — Inpatient Hospital Stay (HOSPITAL_BASED_OUTPATIENT_CLINIC_OR_DEPARTMENT_OTHER): Payer: 59 | Admitting: Family

## 2019-02-18 ENCOUNTER — Inpatient Hospital Stay: Payer: 59

## 2019-02-18 ENCOUNTER — Inpatient Hospital Stay: Payer: 59 | Attending: Hematology & Oncology

## 2019-02-18 ENCOUNTER — Other Ambulatory Visit: Payer: Self-pay

## 2019-02-18 VITALS — BP 133/70 | HR 75 | Temp 97.9°F | Resp 18 | Ht 59.0 in | Wt 205.0 lb

## 2019-02-18 DIAGNOSIS — I2699 Other pulmonary embolism without acute cor pulmonale: Secondary | ICD-10-CM

## 2019-02-18 DIAGNOSIS — Z5112 Encounter for antineoplastic immunotherapy: Secondary | ICD-10-CM | POA: Insufficient documentation

## 2019-02-18 DIAGNOSIS — Z79899 Other long term (current) drug therapy: Secondary | ICD-10-CM | POA: Insufficient documentation

## 2019-02-18 DIAGNOSIS — C9001 Multiple myeloma in remission: Secondary | ICD-10-CM

## 2019-02-18 DIAGNOSIS — Z7901 Long term (current) use of anticoagulants: Secondary | ICD-10-CM | POA: Insufficient documentation

## 2019-02-18 DIAGNOSIS — Z9484 Stem cells transplant status: Secondary | ICD-10-CM

## 2019-02-18 DIAGNOSIS — C50411 Malignant neoplasm of upper-outer quadrant of right female breast: Secondary | ICD-10-CM | POA: Diagnosis present

## 2019-02-18 DIAGNOSIS — C50911 Malignant neoplasm of unspecified site of right female breast: Secondary | ICD-10-CM

## 2019-02-18 DIAGNOSIS — I824Z3 Acute embolism and thrombosis of unspecified deep veins of distal lower extremity, bilateral: Secondary | ICD-10-CM

## 2019-02-18 DIAGNOSIS — I2601 Septic pulmonary embolism with acute cor pulmonale: Secondary | ICD-10-CM

## 2019-02-18 DIAGNOSIS — I82403 Acute embolism and thrombosis of unspecified deep veins of lower extremity, bilateral: Secondary | ICD-10-CM | POA: Diagnosis not present

## 2019-02-18 LAB — CBC WITH DIFFERENTIAL (CANCER CENTER ONLY)
Abs Immature Granulocytes: 0.03 10*3/uL (ref 0.00–0.07)
Basophils Absolute: 0 10*3/uL (ref 0.0–0.1)
Basophils Relative: 0 %
EOS PCT: 2 %
Eosinophils Absolute: 0.1 10*3/uL (ref 0.0–0.5)
HCT: 41 % (ref 36.0–46.0)
Hemoglobin: 13.3 g/dL (ref 12.0–15.0)
Immature Granulocytes: 0 %
Lymphocytes Relative: 43 %
Lymphs Abs: 3.1 10*3/uL (ref 0.7–4.0)
MCH: 32.9 pg (ref 26.0–34.0)
MCHC: 32.4 g/dL (ref 30.0–36.0)
MCV: 101.5 fL — ABNORMAL HIGH (ref 80.0–100.0)
Monocytes Absolute: 0.5 10*3/uL (ref 0.1–1.0)
Monocytes Relative: 7 %
NRBC: 0 % (ref 0.0–0.2)
Neutro Abs: 3.4 10*3/uL (ref 1.7–7.7)
Neutrophils Relative %: 48 %
Platelet Count: 210 10*3/uL (ref 150–400)
RBC: 4.04 MIL/uL (ref 3.87–5.11)
RDW: 12.9 % (ref 11.5–15.5)
WBC Count: 7.1 10*3/uL (ref 4.0–10.5)

## 2019-02-18 LAB — CMP (CANCER CENTER ONLY)
ALT: 28 U/L (ref 0–44)
ANION GAP: 10 (ref 5–15)
AST: 24 U/L (ref 15–41)
Albumin: 4.1 g/dL (ref 3.5–5.0)
Alkaline Phosphatase: 75 U/L (ref 38–126)
BILIRUBIN TOTAL: 0.5 mg/dL (ref 0.3–1.2)
BUN: 27 mg/dL — ABNORMAL HIGH (ref 8–23)
CO2: 26 mmol/L (ref 22–32)
Calcium: 8.8 mg/dL — ABNORMAL LOW (ref 8.9–10.3)
Chloride: 100 mmol/L (ref 98–111)
Creatinine: 1.5 mg/dL — ABNORMAL HIGH (ref 0.44–1.00)
GFR, Est AFR Am: 41 mL/min — ABNORMAL LOW (ref >60)
GFR, Estimated: 35 mL/min — ABNORMAL LOW (ref >60)
GLUCOSE: 94 mg/dL (ref 70–99)
Potassium: 4.3 mmol/L (ref 3.5–5.1)
Sodium: 136 mmol/L (ref 135–145)
TOTAL PROTEIN: 7 g/dL (ref 6.5–8.1)

## 2019-02-18 LAB — LACTATE DEHYDROGENASE: LDH: 154 U/L (ref 98–192)

## 2019-02-18 MED ORDER — DIPHENHYDRAMINE HCL 25 MG PO CAPS
ORAL_CAPSULE | ORAL | Status: AC
  Filled 2019-02-18: qty 1

## 2019-02-18 MED ORDER — ACETAMINOPHEN 325 MG PO TABS
650.0000 mg | ORAL_TABLET | Freq: Once | ORAL | Status: AC
  Administered 2019-02-18: 650 mg via ORAL

## 2019-02-18 MED ORDER — DIPHENHYDRAMINE HCL 25 MG PO CAPS
50.0000 mg | ORAL_CAPSULE | Freq: Once | ORAL | Status: AC
  Administered 2019-02-18: 25 mg via ORAL

## 2019-02-18 MED ORDER — SODIUM CHLORIDE 0.9% FLUSH
10.0000 mL | INTRAVENOUS | Status: DC | PRN
  Administered 2019-02-18: 10 mL
  Filled 2019-02-18: qty 10

## 2019-02-18 MED ORDER — TRASTUZUMAB CHEMO 150 MG IV SOLR
6.0000 mg/kg | Freq: Once | INTRAVENOUS | Status: AC
  Administered 2019-02-18: 525 mg via INTRAVENOUS
  Filled 2019-02-18: qty 25

## 2019-02-18 MED ORDER — ANTICOAGULANT SODIUM CITRATE 4% (200MG/5ML) IV SOLN
5.0000 mL | Freq: Once | Status: AC
  Administered 2019-02-18: 5 mL via INTRAVENOUS
  Filled 2019-02-18: qty 5

## 2019-02-18 MED ORDER — ACETAMINOPHEN 325 MG PO TABS
ORAL_TABLET | ORAL | Status: AC
  Filled 2019-02-18: qty 2

## 2019-02-18 MED ORDER — SODIUM CHLORIDE 0.9 % IV SOLN
Freq: Once | INTRAVENOUS | Status: AC
  Administered 2019-02-18: 09:00:00 via INTRAVENOUS
  Filled 2019-02-18: qty 250

## 2019-02-18 MED ORDER — HEPARIN SOD (PORK) LOCK FLUSH 100 UNIT/ML IV SOLN
500.0000 [IU] | Freq: Once | INTRAVENOUS | Status: DC | PRN
  Filled 2019-02-18: qty 5

## 2019-02-18 NOTE — Progress Notes (Signed)
Hematology and Oncology Follow Up Visit  Marcia [name] [id number] 09/26/1951 68 y.o. 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 She continues to do well and has no complaints at this time.  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 Mammogram last month was negative.  She has followed up with Dr. Dalbert Batman and radiation oncologist Dr. Francesca Jewett with in the last few weeks. She states that she got good reports all the way around.  ECHO last month showed an EF of 55-60%. CA 27.29 at that time was 41.3.  In [place] she had no m-spike detected and kappa light chains were 4.24 mg/dL.  She is doing well on Eliquis and has had no episodes of bleeding, no bruising or petechiae.  No fever, chills, n/v, cough, rash, dizziness, SOB, chest pain, palpitations, abdominal pain or changes in bowel or bladder habits.  No swelling, tenderness, numbness or tingling in her extremities at this time.  No lymphadenopathy noted on exam.  She has maintained a good appetite and is staying well hydrated. Her weight is stable.   ECOG Performance Status: 1 - Symptomatic but completely ambulatory  Medications:  Allergies as of 02/18/2019      Reactions   Bactrim [sulfamethoxazole-trimethoprim] Other (See Comments)   ? Renal failure.   Heparin Other (See Comments)   HIT   Zofran [ondansetron] Shortness Of Breath   "felt like throat was closing"   Clarithromycin Diarrhea, Nausea And Vomiting   Latex Rash   Macrodantin [nitrofurantoin] Rash      Medication List       Accurate as of February 18, 2019  8:53 AM. Always use your most recent med list.        acetaminophen 325 MG tablet Commonly known as:  TYLENOL Take 650 mg by mouth daily as needed for moderate pain or headache.   amoxicillin 500 MG capsule Commonly known as:  AMOXIL Prior to dentist   apixaban 5 MG Tabs tablet Commonly known as:  ELIQUIS Take 1 tablet (5 mg total) by mouth 2 (two) times daily.   doxylamine (Sleep) 25 MG tablet Commonly known as:  UNISOM Take 25 mg by mouth at bedtime as needed for sleep.   feeding supplement (ENSURE ENLIVE) Liqd Take 237 mLs by mouth 3 (three) times daily between meals.   folic acid 1 MG tablet Commonly known as:  FOLVITE Take 2 tablets (2 mg total) by mouth daily.   lidocaine-prilocaine cream Commonly known as:  EMLA Apply 1 application topically as needed (local anesthetic).   nystatin 100000 UNIT/ML suspension Commonly known as:  MYCOSTATIN   vitamin C 250 MG tablet Commonly known as:  ASCORBIC ACID Take 250 mg by mouth 2 (two) times daily.       Allergies:  Allergies  Allergen Reactions  . Bactrim [Sulfamethoxazole-Trimethoprim] Other (See Comments)    ? Renal failure.  . Heparin Other (See Comments)  HIT  . Zofran [Ondansetron] Shortness Of Breath    "felt like throat was closing"  . Clarithromycin Diarrhea and Nausea And Vomiting  . Latex Rash  . Macrodantin [Nitrofurantoin] Rash    Past Medical History, Surgical history, Social history, and Family History were reviewed and updated.  Review of Systems: All other 10 point review of systems is negative.   Physical Exam:  height is '4\' 11"'$  (1.499 m) and weight is 205 lb (93 kg). Her oral temperature is 97.9 F (36.6 C). Her  blood pressure is 133/70 and her pulse is 75. Her respiration is 18 and oxygen saturation is 100%.   Wt Readings from Last 3 Encounters:  02/18/19 205 lb (93 kg)  12/08/18 204 lb (92.5 kg)  10/27/18 201 lb 1.6 oz (91.2 kg)    Ocular: Sclerae unicteric, pupils equal, round and reactive to light Ear-nose-throat: Oropharynx clear, dentition fair Lymphatic: No cervical, supraclavicular or axillary adenopathy Lungs no rales or rhonchi, good excursion bilaterally Heart regular rate and rhythm, no murmur appreciated Abd soft, nontender, positive bowel sounds, no liver or spleen tip palpated on exam, no fluid wave  MSK no focal spinal tenderness, no joint edema Neuro: non-focal, well-oriented, appropriate affect Breasts: Deferred this visit  Lab Results  Component Value Date   WBC 7.1 02/18/2019   HGB 13.3 02/18/2019   HCT 41.0 02/18/2019   MCV 101.5 (H) 02/18/2019   PLT 210 02/18/2019   Lab Results  Component Value Date   FERRITIN 820 (H) 05/17/2018   IRON 272 (H) 05/17/2018   TIBC 295 05/17/2018   UIBC 23 05/17/2018   IRONPCTSAT 92 (H) 05/17/2018   Lab Results  Component Value Date   RETICCTPCT 1.3 03/27/2011   RBC 4.04 02/18/2019   RETICCTABS 56.4 03/27/2011   Lab Results  Component Value Date   KPAFRELGTCHN 42.4 (H) 12/08/2018   LAMBDASER 23.7 12/08/2018   KAPLAMBRATIO 1.79 (H) 12/08/2018   Lab Results  Component Value Date   IGGSERUM 1,015 12/08/2018   IGA 177 12/08/2018   IGMSERUM 136 12/08/2018   Lab Results  Component Value Date   TOTALPROTELP 6.6 12/08/2018   ALBUMINELP 3.7 12/08/2018   A1GS 0.2 12/08/2018   A2GS 0.7 12/08/2018   BETS 1.1 12/08/2018   BETA2SER 0.4 07/02/2015   GAMS 1.0 12/08/2018   MSPIKE Not Observed 12/08/2018   SPEI Comment 03/30/2018     Chemistry      Component Value Date/Time   NA 140 01/19/2019 1020   NA 141 07/06/2017 0816   NA 141 01/05/2017 0956   K 4.3 01/19/2019 1020   K 3.6 07/06/2017 0816   K 4.3 01/05/2017 0956    CL 102 01/19/2019 1020   CL 101 07/06/2017 0816   CO2 28 01/19/2019 1020   CO2 28 07/06/2017 0816   CO2 23 01/05/2017 0956   BUN 30 (H) 01/19/2019 1020   BUN 28 (H) 07/06/2017 0816   BUN 22.4 01/05/2017 0956   CREATININE 1.48 (H) 01/19/2019 1020   CREATININE 1.6 (H) 07/06/2017 0816   CREATININE 1.4 (H) 01/05/2017 0956      Component Value Date/Time   CALCIUM 9.2 01/19/2019 1020   CALCIUM 9.1 07/06/2017 0816   CALCIUM 9.3 01/05/2017 0956   ALKPHOS 68 01/19/2019 1020   ALKPHOS 58 07/06/2017 0816   ALKPHOS 52 01/05/2017 0956   AST 16 01/19/2019 1020   AST 14 01/05/2017 0956   ALT 15 01/19/2019 1020   ALT 58 (H) 07/06/2017 0816   ALT  13 01/05/2017 0956   BILITOT 0.5 01/19/2019 1020   BILITOT 0.56 01/05/2017 0956       Impression and Plan: Ms. Quadros is a very pleasant 68 yo caucasian female with past history of kappa light chain myeloma with stem cell transplant. 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 CA 27.29 is pending at this time. She continues to do well and has no complaints. We will proceed with Herceptin today as planned and see her back in another 3 weeks.  She will contact our office with any questions or concerns. We can certainly see her sooner if need be.   Laverna Peace, NP 3/27/20208:53 AM

## 2019-02-18 NOTE — Patient Instructions (Signed)

## 2019-02-18 NOTE — Patient Instructions (Signed)
[name] Discharge Instructions for Patients Receiving Chemotherapy  Today you received the following chemotherapy agents:  Herceptin  To help prevent nausea and vomiting after your treatment, we encourage you to take your nausea medication as ordered per MD.    If you develop nausea and vomiting that is not controlled by your nausea medication, call the clinic.   BELOW ARE SYMPTOMS THAT SHOULD BE REPORTED IMMEDIATELY:  *FEVER GREATER THAN 100.5 F  *CHILLS WITH OR WITHOUT FEVER  NAUSEA AND VOMITING THAT IS NOT CONTROLLED WITH YOUR NAUSEA MEDICATION  *UNUSUAL SHORTNESS OF BREATH  *UNUSUAL BRUISING OR BLEEDING  TENDERNESS IN MOUTH AND THROAT WITH OR WITHOUT PRESENCE OF ULCERS  *URINARY PROBLEMS  *BOWEL PROBLEMS  UNUSUAL RASH Items with * indicate a potential emergency and should be followed up as soon as possible.  Feel free to call the clinic should you have any questions or concerns. The clinic phone number is [phone].  Please show the [place] at check-in to the Emergency Department and triage nurse.

## 2019-02-19 LAB — CANCER ANTIGEN 27.29: CA 27.29: 43.5 U/mL — ABNORMAL HIGH (ref 0.0–38.6)

## 2019-03-11 ENCOUNTER — Encounter: Payer: Self-pay | Admitting: Hematology & Oncology

## 2019-03-11 ENCOUNTER — Inpatient Hospital Stay: Payer: 59

## 2019-03-11 ENCOUNTER — Other Ambulatory Visit: Payer: Self-pay

## 2019-03-11 ENCOUNTER — Inpatient Hospital Stay: Payer: 59 | Attending: Hematology & Oncology | Admitting: Hematology & Oncology

## 2019-03-11 VITALS — BP 143/67 | HR 73 | Temp 98.4°F | Resp 19 | Wt 213.5 lb

## 2019-03-11 DIAGNOSIS — I2699 Other pulmonary embolism without acute cor pulmonale: Secondary | ICD-10-CM | POA: Insufficient documentation

## 2019-03-11 DIAGNOSIS — Z7901 Long term (current) use of anticoagulants: Secondary | ICD-10-CM | POA: Insufficient documentation

## 2019-03-11 DIAGNOSIS — C50911 Malignant neoplasm of unspecified site of right female breast: Secondary | ICD-10-CM

## 2019-03-11 DIAGNOSIS — Z9484 Stem cells transplant status: Secondary | ICD-10-CM

## 2019-03-11 DIAGNOSIS — C9000 Multiple myeloma not having achieved remission: Secondary | ICD-10-CM | POA: Insufficient documentation

## 2019-03-11 DIAGNOSIS — C9001 Multiple myeloma in remission: Secondary | ICD-10-CM

## 2019-03-11 DIAGNOSIS — I824Z3 Acute embolism and thrombosis of unspecified deep veins of distal lower extremity, bilateral: Secondary | ICD-10-CM

## 2019-03-11 DIAGNOSIS — Z79899 Other long term (current) drug therapy: Secondary | ICD-10-CM | POA: Diagnosis not present

## 2019-03-11 DIAGNOSIS — Z171 Estrogen receptor negative status [ER-]: Secondary | ICD-10-CM | POA: Diagnosis not present

## 2019-03-11 DIAGNOSIS — I2601 Septic pulmonary embolism with acute cor pulmonale: Secondary | ICD-10-CM

## 2019-03-11 DIAGNOSIS — C50411 Malignant neoplasm of upper-outer quadrant of right female breast: Secondary | ICD-10-CM | POA: Diagnosis not present

## 2019-03-11 DIAGNOSIS — I82403 Acute embolism and thrombosis of unspecified deep veins of lower extremity, bilateral: Secondary | ICD-10-CM | POA: Diagnosis not present

## 2019-03-11 DIAGNOSIS — Z5112 Encounter for antineoplastic immunotherapy: Secondary | ICD-10-CM | POA: Diagnosis not present

## 2019-03-11 LAB — COMPREHENSIVE METABOLIC PANEL
ALT: 20 U/L (ref 0–44)
AST: 21 U/L (ref 15–41)
Albumin: 3.8 g/dL (ref 3.5–5.0)
Alkaline Phosphatase: 62 U/L (ref 38–126)
Anion gap: 8 (ref 5–15)
BUN: 27 mg/dL — ABNORMAL HIGH (ref 8–23)
CO2: 26 mmol/L (ref 22–32)
Calcium: 8.8 mg/dL — ABNORMAL LOW (ref 8.9–10.3)
Chloride: 102 mmol/L (ref 98–111)
Creatinine, Ser: 1.47 mg/dL — ABNORMAL HIGH (ref 0.44–1.00)
GFR calc Af Amer: 42 mL/min — ABNORMAL LOW (ref >60)
GFR calc non Af Amer: 36 mL/min — ABNORMAL LOW (ref >60)
Glucose, Bld: 93 mg/dL (ref 70–99)
Potassium: 4.1 mmol/L (ref 3.5–5.1)
Sodium: 136 mmol/L (ref 135–145)
Total Bilirubin: 0.6 mg/dL (ref 0.3–1.2)
Total Protein: 7.3 g/dL (ref 6.5–8.1)

## 2019-03-11 LAB — CBC WITH DIFFERENTIAL (CANCER CENTER ONLY)
Abs Immature Granulocytes: 0.04 10*3/uL (ref 0.00–0.07)
Basophils Absolute: 0 10*3/uL (ref 0.0–0.1)
Basophils Relative: 1 %
Eosinophils Absolute: 0.2 10*3/uL (ref 0.0–0.5)
Eosinophils Relative: 2 %
HCT: 39.9 % (ref 36.0–46.0)
Hemoglobin: 13.2 g/dL (ref 12.0–15.0)
Immature Granulocytes: 1 %
Lymphocytes Relative: 45 %
Lymphs Abs: 3.5 10*3/uL (ref 0.7–4.0)
MCH: 33.1 pg (ref 26.0–34.0)
MCHC: 33.1 g/dL (ref 30.0–36.0)
MCV: 100 fL (ref 80.0–100.0)
Monocytes Absolute: 0.5 10*3/uL (ref 0.1–1.0)
Monocytes Relative: 7 %
Neutro Abs: 3.6 10*3/uL (ref 1.7–7.7)
Neutrophils Relative %: 44 %
Platelet Count: 213 10*3/uL (ref 150–400)
RBC: 3.99 MIL/uL (ref 3.87–5.11)
RDW: 13.1 % (ref 11.5–15.5)
WBC Count: 7.9 10*3/uL (ref 4.0–10.5)
nRBC: 0 % (ref 0.0–0.2)

## 2019-03-11 LAB — LACTATE DEHYDROGENASE: LDH: 138 U/L (ref 98–192)

## 2019-03-11 MED ORDER — TRASTUZUMAB CHEMO 150 MG IV SOLR
600.0000 mg | Freq: Once | INTRAVENOUS | Status: AC
  Administered 2019-03-11: 600 mg via INTRAVENOUS
  Filled 2019-03-11: qty 28.57

## 2019-03-11 MED ORDER — ANTICOAGULANT SODIUM CITRATE 4% (200MG/5ML) IV SOLN
5.0000 mL | Freq: Once | Status: AC
  Administered 2019-03-11: 5 mL via INTRAVENOUS
  Filled 2019-03-11: qty 5

## 2019-03-11 MED ORDER — SODIUM CHLORIDE 0.9% FLUSH
10.0000 mL | INTRAVENOUS | Status: DC | PRN
  Filled 2019-03-11: qty 10

## 2019-03-11 MED ORDER — DIPHENHYDRAMINE HCL 25 MG PO CAPS
ORAL_CAPSULE | ORAL | Status: AC
  Filled 2019-03-11: qty 2

## 2019-03-11 MED ORDER — ACETAMINOPHEN 325 MG PO TABS
ORAL_TABLET | ORAL | Status: AC
  Filled 2019-03-11: qty 2

## 2019-03-11 MED ORDER — DIPHENHYDRAMINE HCL 25 MG PO CAPS
50.0000 mg | ORAL_CAPSULE | Freq: Once | ORAL | Status: AC
  Administered 2019-03-11: 25 mg via ORAL

## 2019-03-11 MED ORDER — SODIUM CHLORIDE 0.9 % IV SOLN
Freq: Once | INTRAVENOUS | Status: AC
  Administered 2019-03-11: 10:00:00 via INTRAVENOUS
  Filled 2019-03-11: qty 250

## 2019-03-11 MED ORDER — ACETAMINOPHEN 325 MG PO TABS
650.0000 mg | ORAL_TABLET | Freq: Once | ORAL | Status: AC
  Administered 2019-03-11: 650 mg via ORAL

## 2019-03-11 NOTE — Progress Notes (Signed)
Hematology and Oncology Follow Up Visit  Marcia [name] [id number] 07-08-1951 68 y.o. 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  She is managing to make it through the coronavirus.  She basically is staying home.  It is not too bad up in [place] where she lives.    She feels good.  She is had no problems with nausea or vomiting.  She is on the Eliquis.  She still has another year or more of Eliquis because of her extensive pulmonary emboli.  There is no bleeding.  Her myeloma is doing quite well.  There is no monoclonal spike in her blood.  All her protein levels are holding steady.  She has had no change in bowel or bladder habits.  She has had no cough.  She has had no fever.  She has had no headache.  Overall, her performance status is ECOG 0.     Medications:  Allergies as of 03/11/2019      Reactions   Bactrim [sulfamethoxazole-trimethoprim] Other (See Comments)   ? Renal failure.   Heparin Other (See Comments)   HIT   Zofran [ondansetron] Shortness Of Breath   "felt like throat was closing"   Clarithromycin Diarrhea, Nausea And Vomiting   Latex Rash   Macrodantin [nitrofurantoin] Rash      Medication List       Accurate as of March 11, 2019  8:50 AM. Always use your most recent med list.        acetaminophen 325 MG tablet Commonly known as:  TYLENOL Take 650 mg by mouth daily as  needed for moderate pain or headache.   amoxicillin 500 MG capsule Commonly known as:  AMOXIL Prior to dentist   apixaban 5 MG Tabs tablet Commonly known as:  ELIQUIS Take 1 tablet (5 mg total) by mouth 2 (two) times daily.   doxylamine (Sleep) 25 MG tablet Commonly known as:  UNISOM Take 25 mg by mouth at bedtime as needed for sleep.   feeding supplement (ENSURE ENLIVE) Liqd Take 237 mLs by mouth 3 (three) times daily between meals.   folic acid 1 MG tablet Commonly known as:  FOLVITE Take 2 tablets (2 mg total) by mouth daily.   lidocaine-prilocaine cream Commonly known as:  EMLA Apply 1 application topically as needed (local anesthetic).   nystatin 100000 UNIT/ML suspension Commonly known as:  MYCOSTATIN   vitamin C 250 MG tablet Commonly known as:  ASCORBIC ACID Take 250 mg by mouth 2 (two) times daily.       Allergies:  Allergies  Allergen Reactions  . Bactrim [Sulfamethoxazole-Trimethoprim] Other (See Comments)    ? Renal failure.  . Heparin Other (See Comments)    HIT  . Zofran [Ondansetron] Shortness Of Breath    "felt like throat was closing"  . Clarithromycin Diarrhea and Nausea And Vomiting  . Latex Rash  . Macrodantin [Nitrofurantoin] Rash    Past Medical History, Surgical history, Social history, and Family History were reviewed and updated.  Review of Systems:  Review of Systems  Constitutional: Negative.   HENT: Negative.   Eyes: Negative.   Respiratory: Negative.   Cardiovascular: Negative.   Gastrointestinal: Negative.   Genitourinary: Negative.   Musculoskeletal: Negative.   Skin: Negative.   Neurological: Negative.   Endo/Heme/Allergies: Negative.   Psychiatric/Behavioral: Negative.       Physical Exam:  weight is 213 lb 8 oz (96.8 kg). Her oral temperature is 98.4 F (36.9 C). Her blood pressure is 143/67 (abnormal) and her pulse is 73. Her respiration is 19 and oxygen saturation is 100%.   Wt Readings from Last 3 Encounters:   03/11/19 213 lb 8 oz (96.8 kg)  02/18/19 205 lb (93 kg)  12/08/18 204 lb (92.5 kg)    Physical Exam Vitals signs reviewed.  Constitutional:      Comments: Her breast exam shows left breast no masses, edema or erythema.  There is no left axillary adenopathy.  Right breast shows a well-healed lumpectomy at about the 7 o'clock position.  She has some hyperpigmentation of the right breast.  There is some slight firmness of the right breast from radiation.  No distinct masses noted in the right breast.  There is no right axillary adenopathy.  HENT:     Head: Normocephalic and atraumatic.  Eyes:     Pupils: Pupils are equal, round, and reactive to light.  Neck:     Musculoskeletal: Normal range of motion.  Cardiovascular:     Rate and Rhythm: Normal rate and regular rhythm.     Heart sounds: Normal heart sounds.  Pulmonary:     Effort: Pulmonary effort is normal.     Breath sounds: Normal breath sounds.  Abdominal:     General: Bowel sounds are normal.     Palpations: Abdomen is soft.  Musculoskeletal: Normal range of motion.        General: No tenderness or deformity.  Lymphadenopathy:     Cervical: No cervical adenopathy.  Skin:    General: Skin is warm and dry.     Findings: No erythema or rash.  Neurological:     Mental Status: She is alert and oriented to person, place, and time.  Psychiatric:        Behavior: Behavior normal.        Thought Content: Thought content normal.        Judgment: Judgment normal.      Lab Results  Component Value Date   WBC 7.9 03/11/2019   HGB 13.2 03/11/2019   HCT 39.9 03/11/2019   MCV 100.0 03/11/2019   PLT 213 03/11/2019   Lab Results  Component Value Date   FERRITIN 820 (H) 05/17/2018   IRON 272 (H) 05/17/2018   TIBC 295 05/17/2018   UIBC 23 05/17/2018   IRONPCTSAT 92 (H) 05/17/2018   Lab Results  Component Value Date   RETICCTPCT 1.3 03/27/2011   RBC 3.99 03/11/2019   RETICCTABS 56.4 03/27/2011   Lab Results  Component  Value Date   KPAFRELGTCHN 42.4 (H) 12/08/2018   LAMBDASER 23.7 12/08/2018   KAPLAMBRATIO 1.79 (H) 12/08/2018   Lab Results  Component Value Date   IGGSERUM 1,015 12/08/2018   IGA 177 12/08/2018   IGMSERUM 136 12/08/2018   Lab Results  Component Value Date   TOTALPROTELP 6.6 12/08/2018   ALBUMINELP 3.7 12/08/2018   A1GS 0.2 12/08/2018   A2GS 0.7 12/08/2018   BETS 1.1 12/08/2018   BETA2SER 0.4 07/02/2015   GAMS 1.0 12/08/2018   MSPIKE Not Observed 12/08/2018  SPEI Comment 03/30/2018     Chemistry      Component Value Date/Time   NA 136 02/18/2019 0830   NA 141 07/06/2017 0816   NA 141 01/05/2017 0956   K 4.3 02/18/2019 0830   K 3.6 07/06/2017 0816   K 4.3 01/05/2017 0956   CL 100 02/18/2019 0830   CL 101 07/06/2017 0816   CO2 26 02/18/2019 0830   CO2 28 07/06/2017 0816   CO2 23 01/05/2017 0956   BUN 27 (H) 02/18/2019 0830   BUN 28 (H) 07/06/2017 0816   BUN 22.4 01/05/2017 0956   CREATININE 1.50 (H) 02/18/2019 0830   CREATININE 1.6 (H) 07/06/2017 0816   CREATININE 1.4 (H) 01/05/2017 0956      Component Value Date/Time   CALCIUM 8.8 (L) 02/18/2019 0830   CALCIUM 9.1 07/06/2017 0816   CALCIUM 9.3 01/05/2017 0956   ALKPHOS 75 02/18/2019 0830   ALKPHOS 58 07/06/2017 0816   ALKPHOS 52 01/05/2017 0956   AST 24 02/18/2019 0830   AST 14 01/05/2017 0956   ALT 28 02/18/2019 0830   ALT 58 (H) 07/06/2017 0816   ALT 13 01/05/2017 0956   BILITOT 0.5 02/18/2019 0830   BILITOT 0.56 01/05/2017 0956       Impression and Plan: [name] is a very pleasant 68 yo caucasian female with past history of kappa light chain myeloma with stem cell transplant. 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  So far, she is done quite well.  I do not see any evidence of recurrent disease with her breast cancer.  She will continue her Herceptin.  We will plan to get her back in another 3 weeks.   I think she finishes a year of Herceptin in September.  After that, we will see about getting her on Nerlynx.  [name], MD 4/17/20208:50 AM

## 2019-03-11 NOTE — Patient Instructions (Signed)
Trastuzumab injection for infusion What is this medicine? TRASTUZUMAB (tras TOO zoo mab) is a monoclonal antibody. It is used to treat breast cancer and stomach cancer. This medicine may be used for other purposes; ask your health care provider or pharmacist if you have questions. COMMON BRAND NAME(S): Herceptin, KANJINTI, OGIVRI What should I tell my health care provider before I take this medicine? They need to know if you have any of these conditions: -heart disease -heart failure -lung or breathing disease, like asthma -an unusual or allergic reaction to trastuzumab, benzyl alcohol, or other medications, foods, dyes, or preservatives -pregnant or trying to get pregnant -breast-feeding How should I use this medicine? This drug is given as an infusion into a vein. It is administered in a hospital or clinic by a specially trained health care professional. Talk to your pediatrician regarding the use of this medicine in children. This medicine is not approved for use in children. Overdosage: If you think you have taken too much of this medicine contact a poison control center or emergency room at once. NOTE: This medicine is only for you. Do not share this medicine with others. What if I miss a dose? It is important not to miss a dose. Call your doctor or health care professional if you are unable to keep an appointment. What may interact with this medicine? This medicine may interact with the following medications: -certain types of chemotherapy, such as daunorubicin, doxorubicin, epirubicin, and idarubicin This list may not describe all possible interactions. Give your health care provider a list of all the medicines, herbs, non-prescription drugs, or dietary supplements you use. Also tell them if you smoke, drink alcohol, or use illegal drugs. Some items may interact with your medicine. What should I watch for while using this medicine? Visit your doctor for checks on your progress. Report  any side effects. Continue your course of treatment even though you feel ill unless your doctor tells you to stop. Call your doctor or health care professional for advice if you get a fever, chills or sore throat, or other symptoms of a cold or flu. Do not treat yourself. Try to avoid being around people who are sick. You may experience fever, chills and shaking during your first infusion. These effects are usually mild and can be treated with other medicines. Report any side effects during the infusion to your health care professional. Fever and chills usually do not happen with later infusions. Do not become pregnant while taking this medicine or for 7 months after stopping it. Women should inform their doctor if they wish to become pregnant or think they might be pregnant. Women of child-bearing potential will need to have a negative pregnancy test before starting this medicine. There is a potential for serious side effects to an unborn child. Talk to your health care professional or pharmacist for more information. Do not breast-feed an infant while taking this medicine or for 7 months after stopping it. Women must use effective birth control with this medicine. What side effects may I notice from receiving this medicine? Side effects that you should report to your doctor or health care professional as soon as possible: -allergic reactions like skin rash, itching or hives, swelling of the face, lips, or tongue -chest pain or palpitations -cough -dizziness -feeling faint or lightheaded, falls -fever -general ill feeling or flu-like symptoms -signs of worsening heart failure like breathing problems; swelling in your legs and feet -unusually weak or tired Side effects that usually do not   require medical attention (report to your doctor or health care professional if they continue or are bothersome): -bone pain -changes in taste -diarrhea -joint pain -nausea/vomiting -weight loss This list may  not describe all possible side effects. Call your doctor for medical advice about side effects. You may report side effects to FDA at 1-800-FDA-1088. Where should I keep my medicine? This drug is given in a hospital or clinic and will not be stored at home. NOTE: This sheet is a summary. It may not cover all possible information. If you have questions about this medicine, talk to your doctor, pharmacist, or health care provider.  2019 Elsevier/Gold Standard (2016-11-04 14:37:52)  

## 2019-03-11 NOTE — Patient Instructions (Signed)

## 2019-03-12 LAB — CANCER ANTIGEN 27.29: CA 27.29: 46.2 U/mL — ABNORMAL HIGH (ref 0.0–38.6)

## 2019-03-12 LAB — IGG, IGA, IGM
IgA: 165 mg/dL (ref 87–352)
IgG (Immunoglobin G), Serum: 982 mg/dL (ref 586–1602)
IgM (Immunoglobulin M), Srm: 123 mg/dL (ref 26–217)

## 2019-03-14 LAB — PROTEIN ELECTROPHORESIS, SERUM
A/G Ratio: 1.2 (ref 0.7–1.7)
Albumin ELP: 3.5 g/dL (ref 2.9–4.4)
Alpha-1-Globulin: 0.2 g/dL (ref 0.0–0.4)
Alpha-2-Globulin: 0.8 g/dL (ref 0.4–1.0)
Beta Globulin: 1.1 g/dL (ref 0.7–1.3)
Gamma Globulin: 1 g/dL (ref 0.4–1.8)
Globulin, Total: 3 g/dL (ref 2.2–3.9)
Total Protein ELP: 6.5 g/dL (ref 6.0–8.5)

## 2019-03-14 LAB — KAPPA/LAMBDA LIGHT CHAINS
Kappa free light chain: 42.6 mg/L — ABNORMAL HIGH (ref 3.3–19.4)
Kappa, lambda light chain ratio: 1.83 — ABNORMAL HIGH (ref 0.26–1.65)
Lambda free light chains: 23.3 mg/L (ref 5.7–26.3)

## 2019-03-25 IMAGING — DX DG CHEST 1V PORT
1 series · 1 of 1 positions shown · non-contrast
Comparison: 03/19/2009

CLINICAL DATA: Post Left Port-A-Catheter Insertion.

EXAM:
PORTABLE CHEST - 1 VIEW

[chest]
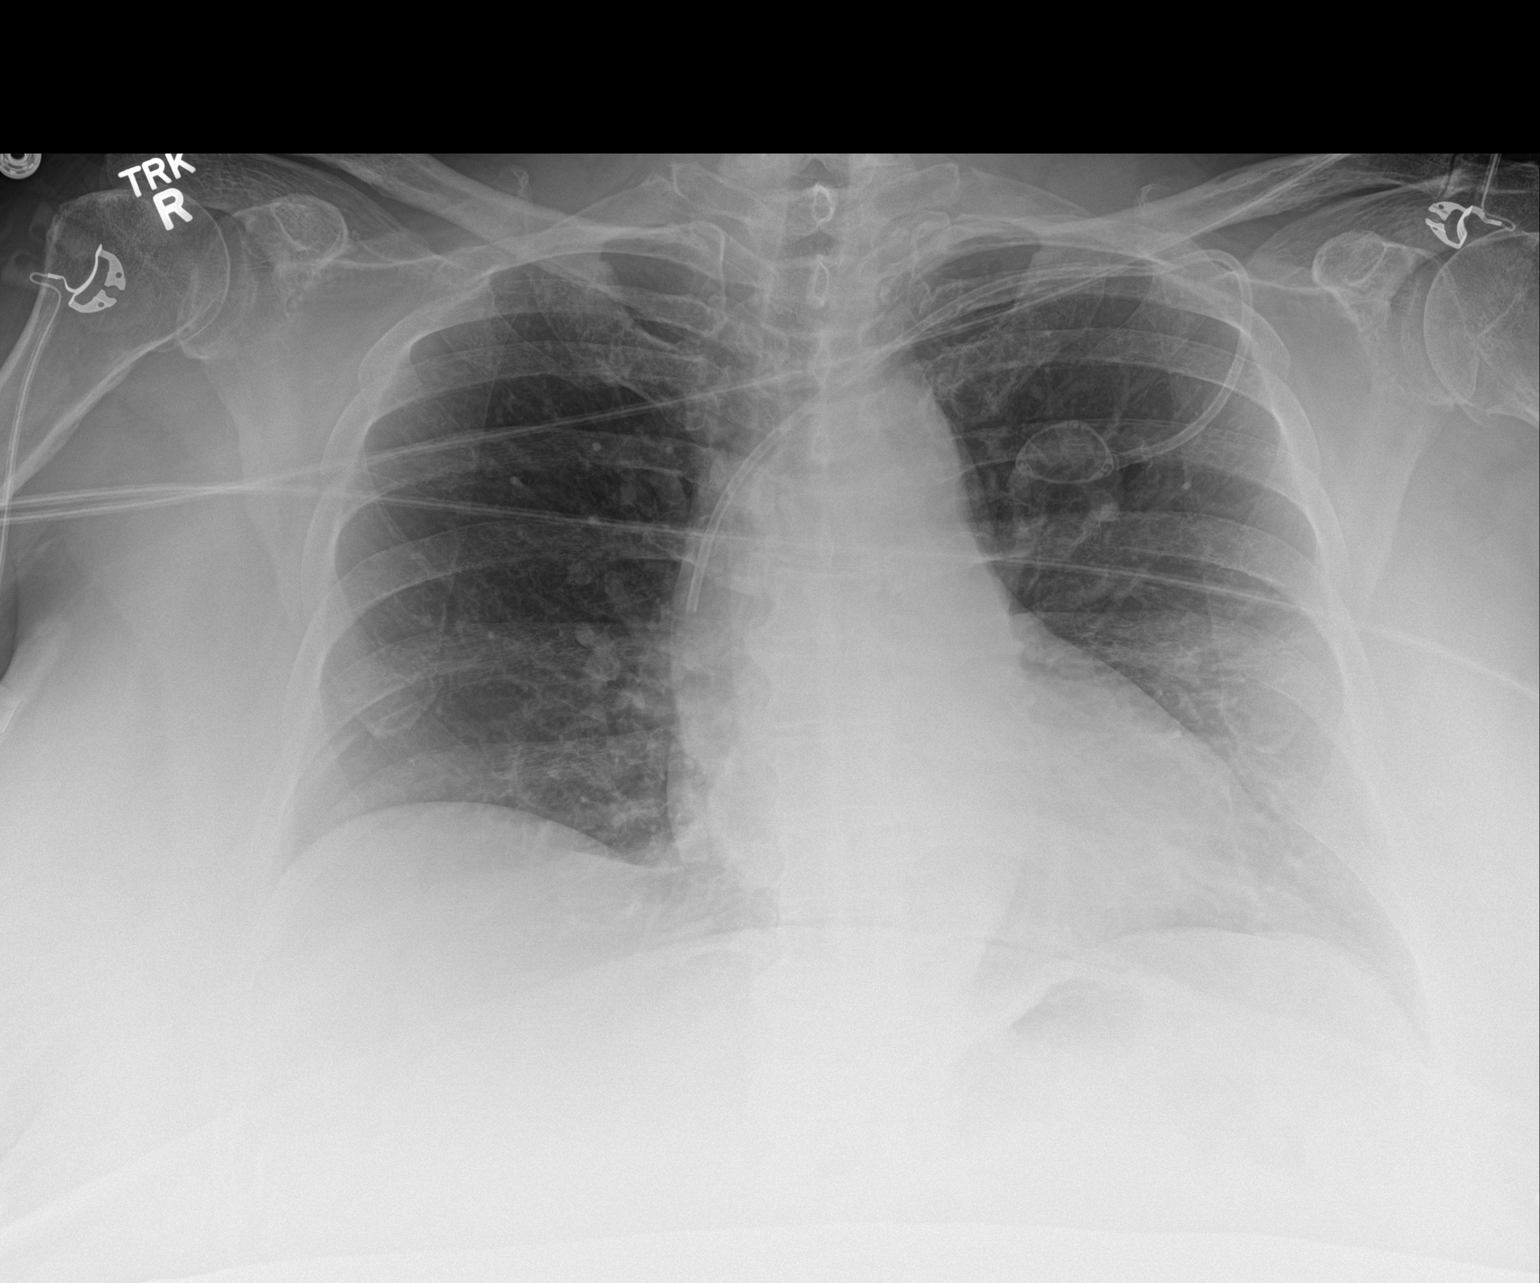

[1 of 1 positions shown; findings below may reference images not displayed]

FINDINGS: Left subclavian power injectable port catheter extends to the mid
SVC. No pneumothorax. Relatively low lung volumes with some streaky
subsegmental atelectasis or scarring in the left mid lung slightly
more conspicuous than on prior study. No confluent airspace disease.

Heart size upper limits normal for technique. Tortuous thoracic
aorta.

No effusion.

Visualized bones unremarkable.
IMPRESSION: 1. Port catheter to mid SVC.  No pneumothorax.

## 2019-04-01 ENCOUNTER — Inpatient Hospital Stay: Payer: 59

## 2019-04-01 ENCOUNTER — Encounter: Payer: Self-pay | Admitting: Family

## 2019-04-01 ENCOUNTER — Other Ambulatory Visit: Payer: Self-pay

## 2019-04-01 ENCOUNTER — Inpatient Hospital Stay: Payer: 59 | Attending: Hematology & Oncology | Admitting: Family

## 2019-04-01 VITALS — BP 153/80 | HR 73 | Temp 97.8°F | Resp 18 | Ht 59.0 in | Wt 216.0 lb

## 2019-04-01 DIAGNOSIS — C9001 Multiple myeloma in remission: Secondary | ICD-10-CM

## 2019-04-01 DIAGNOSIS — Z86711 Personal history of pulmonary embolism: Secondary | ICD-10-CM | POA: Diagnosis not present

## 2019-04-01 DIAGNOSIS — Z9484 Stem cells transplant status: Secondary | ICD-10-CM | POA: Insufficient documentation

## 2019-04-01 DIAGNOSIS — C50411 Malignant neoplasm of upper-outer quadrant of right female breast: Secondary | ICD-10-CM | POA: Diagnosis not present

## 2019-04-01 DIAGNOSIS — R202 Paresthesia of skin: Secondary | ICD-10-CM | POA: Diagnosis not present

## 2019-04-01 DIAGNOSIS — Z5112 Encounter for antineoplastic immunotherapy: Secondary | ICD-10-CM | POA: Diagnosis not present

## 2019-04-01 DIAGNOSIS — Z79899 Other long term (current) drug therapy: Secondary | ICD-10-CM | POA: Diagnosis not present

## 2019-04-01 DIAGNOSIS — R5383 Other fatigue: Secondary | ICD-10-CM | POA: Diagnosis not present

## 2019-04-01 DIAGNOSIS — Z171 Estrogen receptor negative status [ER-]: Secondary | ICD-10-CM | POA: Diagnosis not present

## 2019-04-01 DIAGNOSIS — C50911 Malignant neoplasm of unspecified site of right female breast: Secondary | ICD-10-CM

## 2019-04-01 DIAGNOSIS — Z86718 Personal history of other venous thrombosis and embolism: Secondary | ICD-10-CM

## 2019-04-01 DIAGNOSIS — Z7901 Long term (current) use of anticoagulants: Secondary | ICD-10-CM | POA: Insufficient documentation

## 2019-04-01 LAB — CBC WITH DIFFERENTIAL (CANCER CENTER ONLY)
Abs Immature Granulocytes: 0.03 10*3/uL (ref 0.00–0.07)
Basophils Absolute: 0 10*3/uL (ref 0.0–0.1)
Basophils Relative: 1 %
Eosinophils Absolute: 0.2 10*3/uL (ref 0.0–0.5)
Eosinophils Relative: 3 %
HCT: 39.5 % (ref 36.0–46.0)
Hemoglobin: 13.1 g/dL (ref 12.0–15.0)
Immature Granulocytes: 0 %
Lymphocytes Relative: 44 %
Lymphs Abs: 3.3 10*3/uL (ref 0.7–4.0)
MCH: 33.4 pg (ref 26.0–34.0)
MCHC: 33.2 g/dL (ref 30.0–36.0)
MCV: 100.8 fL — ABNORMAL HIGH (ref 80.0–100.0)
Monocytes Absolute: 0.5 10*3/uL (ref 0.1–1.0)
Monocytes Relative: 7 %
Neutro Abs: 3.3 10*3/uL (ref 1.7–7.7)
Neutrophils Relative %: 45 %
Platelet Count: 205 10*3/uL (ref 150–400)
RBC: 3.92 MIL/uL (ref 3.87–5.11)
RDW: 13.2 % (ref 11.5–15.5)
WBC Count: 7.3 10*3/uL (ref 4.0–10.5)
nRBC: 0 % (ref 0.0–0.2)

## 2019-04-01 LAB — CMP (CANCER CENTER ONLY)
ALT: 16 U/L (ref 0–44)
AST: 16 U/L (ref 15–41)
Albumin: 4.1 g/dL (ref 3.5–5.0)
Alkaline Phosphatase: 60 U/L (ref 38–126)
Anion gap: 9 (ref 5–15)
BUN: 35 mg/dL — ABNORMAL HIGH (ref 8–23)
CO2: 26 mmol/L (ref 22–32)
Calcium: 9.2 mg/dL (ref 8.9–10.3)
Chloride: 102 mmol/L (ref 98–111)
Creatinine: 1.53 mg/dL — ABNORMAL HIGH (ref 0.44–1.00)
GFR, Est AFR Am: 40 mL/min — ABNORMAL LOW (ref >60)
GFR, Estimated: 34 mL/min — ABNORMAL LOW (ref >60)
Glucose, Bld: 96 mg/dL (ref 70–99)
Potassium: 4.2 mmol/L (ref 3.5–5.1)
Sodium: 137 mmol/L (ref 135–145)
Total Bilirubin: 0.5 mg/dL (ref 0.3–1.2)
Total Protein: 6.7 g/dL (ref 6.5–8.1)

## 2019-04-01 LAB — LACTATE DEHYDROGENASE: LDH: 156 U/L (ref 98–192)

## 2019-04-01 MED ORDER — ACETAMINOPHEN 325 MG PO TABS
650.0000 mg | ORAL_TABLET | Freq: Once | ORAL | Status: AC
  Administered 2019-04-01: 09:00:00 650 mg via ORAL

## 2019-04-01 MED ORDER — SODIUM CHLORIDE 0.9 % IV SOLN
Freq: Once | INTRAVENOUS | Status: AC
  Administered 2019-04-01: 09:00:00 via INTRAVENOUS
  Filled 2019-04-01: qty 250

## 2019-04-01 MED ORDER — DIPHENHYDRAMINE HCL 25 MG PO CAPS: 50.0000 mg | ORAL_CAPSULE | Freq: Once | ORAL | Status: DC

## 2019-04-01 MED ORDER — ANTICOAGULANT SODIUM CITRATE 4% (200MG/5ML) IV SOLN
5.0000 mL | Freq: Once | Status: AC
  Administered 2019-04-01: 5 mL via INTRAVENOUS
  Filled 2019-04-01: qty 5

## 2019-04-01 MED ORDER — SODIUM CHLORIDE 0.9% FLUSH
10.0000 mL | INTRAVENOUS | Status: DC | PRN
  Administered 2019-04-01: 11:00:00 10 mL
  Filled 2019-04-01: qty 10

## 2019-04-01 MED ORDER — DIPHENHYDRAMINE HCL 25 MG PO CAPS
ORAL_CAPSULE | ORAL | Status: AC
  Filled 2019-04-01: qty 2

## 2019-04-01 MED ORDER — ACETAMINOPHEN 325 MG PO TABS
ORAL_TABLET | ORAL | Status: AC
  Filled 2019-04-01: qty 2

## 2019-04-01 MED ORDER — TRASTUZUMAB CHEMO 150 MG IV SOLR
600.0000 mg | Freq: Once | INTRAVENOUS | Status: AC
  Administered 2019-04-01: 10:00:00 600 mg via INTRAVENOUS
  Filled 2019-04-01: qty 28.57

## 2019-04-01 NOTE — Patient Instructions (Signed)
Trastuzumab injection for infusion What is this medicine? TRASTUZUMAB (tras TOO zoo mab) is a monoclonal antibody. It is used to treat breast cancer and stomach cancer. This medicine may be used for other purposes; ask your health care provider or pharmacist if you have questions. COMMON BRAND NAME(S): Herceptin, KANJINTI, OGIVRI What should I tell my health care provider before I take this medicine? They need to know if you have any of these conditions: -heart disease -heart failure -lung or breathing disease, like asthma -an unusual or allergic reaction to trastuzumab, benzyl alcohol, or other medications, foods, dyes, or preservatives -pregnant or trying to get pregnant -breast-feeding How should I use this medicine? This drug is given as an infusion into a vein. It is administered in a hospital or clinic by a specially trained health care professional. Talk to your pediatrician regarding the use of this medicine in children. This medicine is not approved for use in children. Overdosage: If you think you have taken too much of this medicine contact a poison control center or emergency room at once. NOTE: This medicine is only for you. Do not share this medicine with others. What if I miss a dose? It is important not to miss a dose. Call your doctor or health care professional if you are unable to keep an appointment. What may interact with this medicine? This medicine may interact with the following medications: -certain types of chemotherapy, such as daunorubicin, doxorubicin, epirubicin, and idarubicin This list may not describe all possible interactions. Give your health care provider a list of all the medicines, herbs, non-prescription drugs, or dietary supplements you use. Also tell them if you smoke, drink alcohol, or use illegal drugs. Some items may interact with your medicine. What should I watch for while using this medicine? Visit your doctor for checks on your progress. Report  any side effects. Continue your course of treatment even though you feel ill unless your doctor tells you to stop. Call your doctor or health care professional for advice if you get a fever, chills or sore throat, or other symptoms of a cold or flu. Do not treat yourself. Try to avoid being around people who are sick. You may experience fever, chills and shaking during your first infusion. These effects are usually mild and can be treated with other medicines. Report any side effects during the infusion to your health care professional. Fever and chills usually do not happen with later infusions. Do not become pregnant while taking this medicine or for 7 months after stopping it. Women should inform their doctor if they wish to become pregnant or think they might be pregnant. Women of child-bearing potential will need to have a negative pregnancy test before starting this medicine. There is a potential for serious side effects to an unborn child. Talk to your health care professional or pharmacist for more information. Do not breast-feed an infant while taking this medicine or for 7 months after stopping it. Women must use effective birth control with this medicine. What side effects may I notice from receiving this medicine? Side effects that you should report to your doctor or health care professional as soon as possible: -allergic reactions like skin rash, itching or hives, swelling of the face, lips, or tongue -chest pain or palpitations -cough -dizziness -feeling faint or lightheaded, falls -fever -general ill feeling or flu-like symptoms -signs of worsening heart failure like breathing problems; swelling in your legs and feet -unusually weak or tired Side effects that usually do not   require medical attention (report to your doctor or health care professional if they continue or are bothersome): -bone pain -changes in taste -diarrhea -joint pain -nausea/vomiting -weight loss This list may  not describe all possible side effects. Call your doctor for medical advice about side effects. You may report side effects to FDA at 1-800-FDA-1088. Where should I keep my medicine? This drug is given in a hospital or clinic and will not be stored at home. NOTE: This sheet is a summary. It may not cover all possible information. If you have questions about this medicine, talk to your doctor, pharmacist, or health care provider.  2019 Elsevier/Gold Standard (2016-11-04 14:37:52)  

## 2019-04-01 NOTE — Progress Notes (Signed)
Hematology and Oncology Follow Up Visit  Marcia [name] [id number] 1951/03/18 68 y.o. 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 in09/2011  Past Therapy: Neoadjuvant carboplatinum/Taxotere Herceptin/Perjeta- s/p cycle 4 Radiation therapy to the right breast ([facility])  Current Therapy:   Herceptin - adjuvant therapy started09/25/2019 Eliquis 5 mg po BID - started on 05/18/2018   Interim History:  [name] is here today for follow-up and treatment. She is doing quite well and has no complaints at this time.  She has started walking with her husband for exercise and has been enjoying working in her flower beds.  CA 27.29 in April was 46.2.  No M-spike observed in April.  She has occasional episodes of fatigue.  She is sleeping well at night. No night sweats or hot flashes to report.  No fever, chills, n/v, cough, rash, dizziness, SOB, chest pain, palpitations, abdominal pain or changes in bowel or bladder habits.  No swelling, tenderness or numbness in her extremities.  She has intermittent tingling in her feet at times.  No falls or syncopal episodes.  No lymphadenopathy noted on exam. She has had no episodes of bleeding, no bruising or petechiae on Eliquis.  She has a good appetite and is staying well hydrated. Her weight is stable.   ECOG Performance Status: 0 - Asymptomatic  Medications:  Allergies as of 04/01/2019      Reactions   Bactrim [sulfamethoxazole-trimethoprim] Other (See Comments)   ? Renal failure.   Heparin Other (See Comments)   HIT   Zofran [ondansetron] Shortness Of Breath   "felt like throat was closing"   Clarithromycin Diarrhea, Nausea And Vomiting   Latex Rash   Macrodantin [nitrofurantoin] Rash       Medication List       Accurate as of Apr 01, 2019  8:55 AM. If you have any questions, ask your nurse or doctor.        acetaminophen 325 MG tablet Commonly known as:  TYLENOL Take 650 mg by mouth daily as needed for moderate pain or headache.   amoxicillin 500 MG capsule Commonly known as:  AMOXIL Prior to dentist   apixaban 5 MG Tabs tablet Commonly known as:  ELIQUIS Take 1 tablet (5 mg total) by mouth 2 (two) times daily.   doxylamine (Sleep) 25 MG tablet Commonly known as:  UNISOM Take 25 mg by mouth at bedtime as needed for sleep.   feeding supplement (ENSURE ENLIVE) Liqd Take 237 mLs by mouth 3 (three) times daily between meals. What changed:  when to take this   folic acid 1 MG tablet Commonly known as:  FOLVITE Take 2 tablets (2 mg total) by mouth daily.   lidocaine-prilocaine cream Commonly known as:  EMLA Apply 1 application topically as needed (local anesthetic).   nystatin 100000 UNIT/ML suspension Commonly known as:  MYCOSTATIN   vitamin C 250 MG tablet Commonly known as:  ASCORBIC ACID Take 250 mg by mouth 2 (two) times daily.       Allergies:  Allergies  Allergen Reactions  . Bactrim [Sulfamethoxazole-Trimethoprim] Other (See Comments)    ? Renal failure.  . Heparin Other (See Comments)    HIT  . Zofran [Ondansetron] Shortness Of Breath    "felt like throat was closing"  . Clarithromycin Diarrhea and Nausea And Vomiting  .  Latex Rash  . Macrodantin [Nitrofurantoin] Rash    Past Medical History, Surgical history, Social history, and Family History were reviewed and updated.  Review of Systems: All other 10 point review of systems is negative.   Physical Exam:  height is '4\' 11"'$  (1.499 m) and weight is 216 lb 0.6 oz (98 kg). Her oral temperature is 97.8 F (36.6 C). Her blood pressure is 153/80 (abnormal) and her pulse is 73. Her respiration is 18 and oxygen saturation is 98%.   Wt Readings from Last 3 Encounters:  04/01/19 216 lb  0.6 oz (98 kg)  03/11/19 213 lb 8 oz (96.8 kg)  02/18/19 205 lb (93 kg)    Ocular: Sclerae unicteric, pupils equal, round and reactive to light Ear-nose-throat: Oropharynx clear, dentition fair Lymphatic: No cervical or supraclavicular adenopathy Lungs no rales or rhonchi, good excursion bilaterally Heart regular rate and rhythm, no murmur appreciated Abd soft, nontender, positive bowel sounds, no liver or spleen tip palpated on exam, no fluid wave  MSK no focal spinal tenderness, no joint edema Neuro: non-focal, well-oriented, appropriate affect Breasts: No changes, no mass, lesion or rash  Lab Results  Component Value Date   WBC 7.3 04/01/2019   HGB 13.1 04/01/2019   HCT 39.5 04/01/2019   MCV 100.8 (H) 04/01/2019   PLT 205 04/01/2019   Lab Results  Component Value Date   FERRITIN 820 (H) 05/17/2018   IRON 272 (H) 05/17/2018   TIBC 295 05/17/2018   UIBC 23 05/17/2018   IRONPCTSAT 92 (H) 05/17/2018   Lab Results  Component Value Date   RETICCTPCT 1.3 03/27/2011   RBC 3.92 04/01/2019   RETICCTABS 56.4 03/27/2011   Lab Results  Component Value Date   KPAFRELGTCHN 42.6 (H) 03/11/2019   LAMBDASER 23.3 03/11/2019   KAPLAMBRATIO 1.83 (H) 03/11/2019   Lab Results  Component Value Date   IGGSERUM 982 03/11/2019   IGA 165 03/11/2019   IGMSERUM 123 03/11/2019   Lab Results  Component Value Date   TOTALPROTELP 6.5 03/11/2019   ALBUMINELP 3.5 03/11/2019   A1GS 0.2 03/11/2019   A2GS 0.8 03/11/2019   BETS 1.1 03/11/2019   BETA2SER 0.4 07/02/2015   GAMS 1.0 03/11/2019   MSPIKE Not Observed 03/11/2019   SPEI Comment 03/11/2019     Chemistry      Component Value Date/Time   NA 136 03/11/2019 0838   NA 141 07/06/2017 0816   NA 141 01/05/2017 0956   K 4.1 03/11/2019 0838   K 3.6 07/06/2017 0816   K 4.3 01/05/2017 0956   CL 102 03/11/2019 0838   CL 101 07/06/2017 0816   CO2 26 03/11/2019 0838   CO2 28 07/06/2017 0816   CO2 23 01/05/2017 0956   BUN 27 (H)  03/11/2019 0838   BUN 28 (H) 07/06/2017 0816   BUN 22.4 01/05/2017 0956   CREATININE 1.47 (H) 03/11/2019 0838   CREATININE 1.50 (H) 02/18/2019 0830   CREATININE 1.6 (H) 07/06/2017 0816   CREATININE 1.4 (H) 01/05/2017 0956      Component Value Date/Time   CALCIUM 8.8 (L) 03/11/2019 0838   CALCIUM 9.1 07/06/2017 0816   CALCIUM 9.3 01/05/2017 0956   ALKPHOS 62 03/11/2019 0838   ALKPHOS 58 07/06/2017 0816   ALKPHOS 52 01/05/2017 0956   AST 21 03/11/2019 0838   AST 24 02/18/2019 0830   AST 14 01/05/2017 0956   ALT 20 03/11/2019 0838   ALT 28 02/18/2019 0830   ALT 58 (H) 07/06/2017 5003  ALT 13 01/05/2017 0956   BILITOT 0.6 03/11/2019 0838   BILITOT 0.5 02/18/2019 0830   BILITOT 0.56 01/05/2017 0956       Impression and Plan: Ms. Weisel is a very pleasant 68 yo caucasian female with past history of kappa light chain myeloma with stem cell transplant. 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 She continues to do well and there has been no evidence of recurrence of breast cancer.  We will proceed with treatment today as planned.  We will see her again in another 3 weeks.  She will contact our office with any questions or concerns. We can certainly see her sooner if need be.   Laverna Peace, NP 5/8/20208:55 AM

## 2019-04-01 NOTE — Patient Instructions (Signed)

## 2019-04-02 LAB — IGG, IGA, IGM
IgA: 170 mg/dL (ref 87–352)
IgG (Immunoglobin G), Serum: 1008 mg/dL (ref 586–1602)
IgM (Immunoglobulin M), Srm: 134 mg/dL (ref 26–217)

## 2019-04-04 LAB — PROTEIN ELECTROPHORESIS, SERUM, WITH REFLEX
A/G Ratio: 1.1 (ref 0.7–1.7)
Albumin ELP: 3.3 g/dL (ref 2.9–4.4)
Alpha-1-Globulin: 0.2 g/dL (ref 0.0–0.4)
Alpha-2-Globulin: 0.8 g/dL (ref 0.4–1.0)
Beta Globulin: 1.2 g/dL (ref 0.7–1.3)
Gamma Globulin: 1 g/dL (ref 0.4–1.8)
Globulin, Total: 3.1 g/dL (ref 2.2–3.9)
Total Protein ELP: 6.4 g/dL (ref 6.0–8.5)

## 2019-04-04 LAB — KAPPA/LAMBDA LIGHT CHAINS
Kappa free light chain: 44.3 mg/L — ABNORMAL HIGH (ref 3.3–19.4)
Kappa, lambda light chain ratio: 1.83 — ABNORMAL HIGH (ref 0.26–1.65)
Lambda free light chains: 24.2 mg/L (ref 5.7–26.3)

## 2019-04-22 ENCOUNTER — Inpatient Hospital Stay: Payer: 59

## 2019-04-22 ENCOUNTER — Inpatient Hospital Stay (HOSPITAL_BASED_OUTPATIENT_CLINIC_OR_DEPARTMENT_OTHER): Payer: 59 | Admitting: Hematology & Oncology

## 2019-04-22 ENCOUNTER — Other Ambulatory Visit: Payer: Self-pay

## 2019-04-22 DIAGNOSIS — Z79899 Other long term (current) drug therapy: Secondary | ICD-10-CM

## 2019-04-22 DIAGNOSIS — C9001 Multiple myeloma in remission: Secondary | ICD-10-CM

## 2019-04-22 DIAGNOSIS — Z171 Estrogen receptor negative status [ER-]: Secondary | ICD-10-CM

## 2019-04-22 DIAGNOSIS — C50911 Malignant neoplasm of unspecified site of right female breast: Secondary | ICD-10-CM

## 2019-04-22 DIAGNOSIS — C50411 Malignant neoplasm of upper-outer quadrant of right female breast: Secondary | ICD-10-CM

## 2019-04-22 DIAGNOSIS — Z86711 Personal history of pulmonary embolism: Secondary | ICD-10-CM

## 2019-04-22 DIAGNOSIS — Z86718 Personal history of other venous thrombosis and embolism: Secondary | ICD-10-CM

## 2019-04-22 DIAGNOSIS — Z7901 Long term (current) use of anticoagulants: Secondary | ICD-10-CM

## 2019-04-22 DIAGNOSIS — R5383 Other fatigue: Secondary | ICD-10-CM

## 2019-04-22 DIAGNOSIS — Z9484 Stem cells transplant status: Secondary | ICD-10-CM

## 2019-04-22 DIAGNOSIS — R202 Paresthesia of skin: Secondary | ICD-10-CM

## 2019-04-22 DIAGNOSIS — I2601 Septic pulmonary embolism with acute cor pulmonale: Secondary | ICD-10-CM

## 2019-04-22 DIAGNOSIS — I824Z3 Acute embolism and thrombosis of unspecified deep veins of distal lower extremity, bilateral: Secondary | ICD-10-CM

## 2019-04-22 LAB — CMP (CANCER CENTER ONLY)
ALT: 16 U/L (ref 0–44)
AST: 16 U/L (ref 15–41)
Albumin: 4.1 g/dL (ref 3.5–5.0)
Alkaline Phosphatase: 56 U/L (ref 38–126)
Anion gap: 9 (ref 5–15)
BUN: 35 mg/dL — ABNORMAL HIGH (ref 8–23)
CO2: 27 mmol/L (ref 22–32)
Calcium: 9.1 mg/dL (ref 8.9–10.3)
Chloride: 102 mmol/L (ref 98–111)
Creatinine: 1.57 mg/dL — ABNORMAL HIGH (ref 0.44–1.00)
GFR, Est AFR Am: 39 mL/min — ABNORMAL LOW (ref >60)
GFR, Estimated: 33 mL/min — ABNORMAL LOW (ref >60)
Glucose, Bld: 95 mg/dL (ref 70–99)
Potassium: 4.1 mmol/L (ref 3.5–5.1)
Sodium: 138 mmol/L (ref 135–145)
Total Bilirubin: 0.5 mg/dL (ref 0.3–1.2)
Total Protein: 6.9 g/dL (ref 6.5–8.1)

## 2019-04-22 LAB — CBC WITH DIFFERENTIAL (CANCER CENTER ONLY)
Abs Immature Granulocytes: 0.03 10*3/uL (ref 0.00–0.07)
Basophils Absolute: 0 10*3/uL (ref 0.0–0.1)
Basophils Relative: 0 %
Eosinophils Absolute: 0.3 10*3/uL (ref 0.0–0.5)
Eosinophils Relative: 3 %
HCT: 39.7 % (ref 36.0–46.0)
Hemoglobin: 13.2 g/dL (ref 12.0–15.0)
Immature Granulocytes: 0 %
Lymphocytes Relative: 51 %
Lymphs Abs: 3.8 10*3/uL (ref 0.7–4.0)
MCH: 33.3 pg (ref 26.0–34.0)
MCHC: 33.2 g/dL (ref 30.0–36.0)
MCV: 100.3 fL — ABNORMAL HIGH (ref 80.0–100.0)
Monocytes Absolute: 0.5 10*3/uL (ref 0.1–1.0)
Monocytes Relative: 6 %
Neutro Abs: 3.1 10*3/uL (ref 1.7–7.7)
Neutrophils Relative %: 40 %
Platelet Count: 192 10*3/uL (ref 150–400)
RBC: 3.96 MIL/uL (ref 3.87–5.11)
RDW: 13.2 % (ref 11.5–15.5)
WBC Count: 7.7 10*3/uL (ref 4.0–10.5)
nRBC: 0 % (ref 0.0–0.2)

## 2019-04-22 LAB — LACTATE DEHYDROGENASE: LDH: 145 U/L (ref 98–192)

## 2019-04-22 MED ORDER — ACETAMINOPHEN 325 MG PO TABS
ORAL_TABLET | ORAL | Status: AC
  Filled 2019-04-22: qty 2

## 2019-04-22 MED ORDER — ANTICOAGULANT SODIUM CITRATE 4% (200MG/5ML) IV SOLN
5.0000 mL | Freq: Once | Status: AC
  Administered 2019-04-22: 5 mL via INTRAVENOUS
  Filled 2019-04-22: qty 5

## 2019-04-22 MED ORDER — ACETAMINOPHEN 325 MG PO TABS
650.0000 mg | ORAL_TABLET | Freq: Once | ORAL | Status: AC
  Administered 2019-04-22: 650 mg via ORAL

## 2019-04-22 MED ORDER — APIXABAN 2.5 MG PO TABS: 2.5000 mg | ORAL_TABLET | Freq: Two times a day (BID) | ORAL | 10 refills | Status: DC

## 2019-04-22 MED ORDER — DIPHENHYDRAMINE HCL 25 MG PO CAPS
ORAL_CAPSULE | ORAL | Status: AC
  Filled 2019-04-22: qty 1

## 2019-04-22 MED ORDER — TRASTUZUMAB CHEMO 150 MG IV SOLR
600.0000 mg | Freq: Once | INTRAVENOUS | Status: AC
  Administered 2019-04-22: 600 mg via INTRAVENOUS
  Filled 2019-04-22: qty 28.57

## 2019-04-22 MED ORDER — SODIUM CHLORIDE 0.9 % IV SOLN
Freq: Once | INTRAVENOUS | Status: AC
  Administered 2019-04-22: 10:00:00 via INTRAVENOUS
  Filled 2019-04-22: qty 250

## 2019-04-22 MED ORDER — DIPHENHYDRAMINE HCL 25 MG PO CAPS
50.0000 mg | ORAL_CAPSULE | Freq: Once | ORAL | Status: AC
  Administered 2019-04-22: 25 mg via ORAL

## 2019-04-22 MED ORDER — SODIUM CHLORIDE 0.9% FLUSH
10.0000 mL | INTRAVENOUS | Status: DC | PRN
  Administered 2019-04-22: 10 mL
  Filled 2019-04-22: qty 10

## 2019-04-22 NOTE — Patient Instructions (Signed)
[facility] Discharge Instructions for Patients Receiving Chemotherapy Today you received the following chemotherapy agents:  Herceptin To help prevent nausea and vomiting after your treatment, we encourage you to take your nausea medication as prescribed.   If you develop nausea and vomiting that is not controlled by your nausea medication, call the clinic.   BELOW ARE SYMPTOMS THAT SHOULD BE REPORTED IMMEDIATELY:  *FEVER GREATER THAN 100.5 F  *CHILLS WITH OR WITHOUT FEVER  NAUSEA AND VOMITING THAT IS NOT CONTROLLED WITH YOUR NAUSEA MEDICATION  *UNUSUAL SHORTNESS OF BREATH  *UNUSUAL BRUISING OR BLEEDING  TENDERNESS IN MOUTH AND THROAT WITH OR WITHOUT PRESENCE OF ULCERS  *URINARY PROBLEMS  *BOWEL PROBLEMS  UNUSUAL RASH Items with * indicate a potential emergency and should be followed up as soon as possible.  Feel free to call the clinic should you have any questions or concerns. The clinic phone number is [phone].  Please show the CHEMO ALERT CARD at check-in to the Emergency Department and triage nurse.   

## 2019-04-22 NOTE — Progress Notes (Signed)
Hematology and Oncology Follow Up Visit  Marcia [name] [id number] 1950/12/17 68 y.o. 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  This will be her last Herceptin.  I think that she has done well and that she does not need further Herceptin nor does she be Nerlynx.  She had a complete response to chemotherapy in the neoadjuvant setting.  I do not see that 1 year of Nerlynx is going to be helpful.  She has been on therapeutic Eliquis now for 1 year.  I will now have her on maintenance Eliquis at 2.5 mg p.o. twice daily for 1 year.  She is feeling well.  She has been outside working.  She has been enjoying the spring weather although the rain has been a little bit of a problem.  She has had no nausea or vomiting.  Has had no cough.  She has had no headache.  As far as her myeloma goes, she is done very well with this.  Her last kappa light chain was 4.4 mg/dL.  Her last echocardiogram was back in February.  The left ventricular ejection fraction was 55 to 60%.  Overall, her lymphoma status is ECOG 1.    Medications:  Allergies as of 04/22/2019      Reactions   Bactrim [sulfamethoxazole-trimethoprim] Other (See Comments)   ? Renal failure.   Heparin Other (See Comments)   HIT   Zofran [ondansetron] Shortness Of Breath   "felt like throat was closing"   Clarithromycin  Diarrhea, Nausea And Vomiting   Latex Rash   Macrodantin [nitrofurantoin] Rash      Medication List       Accurate as of Apr 22, 2019 10:00 AM. If you have any questions, ask your nurse or doctor.        acetaminophen 325 MG tablet Commonly known as:  TYLENOL Take 650 mg by mouth daily as needed for moderate pain or headache.   amoxicillin 500 MG capsule Commonly known as:  AMOXIL Prior to dentist   apixaban 5 MG Tabs tablet Commonly known as:  ELIQUIS Take 1 tablet (5 mg total) by mouth 2 (two) times daily.   doxylamine (Sleep) 25 MG tablet Commonly known as:  UNISOM Take 25 mg by mouth at bedtime as needed for sleep.   feeding supplement (ENSURE ENLIVE) Liqd Take 237 mLs by mouth 3 (three) times daily between meals. What changed:  when to take this   folic acid 1 MG tablet Commonly known as:  FOLVITE Take 2 tablets (2 mg total) by mouth daily.   lidocaine-prilocaine cream Commonly known as:  EMLA Apply 1 application topically as needed (local anesthetic).   nystatin 100000 UNIT/ML suspension Commonly known as:  MYCOSTATIN   vitamin C 250 MG tablet Commonly known as:  ASCORBIC ACID Take 250 mg by mouth 2 (two) times daily.       Allergies:  Allergies  Allergen Reactions  . Bactrim [Sulfamethoxazole-Trimethoprim] Other (See Comments)    ?  Renal failure.  . Heparin Other (See Comments)    HIT  . Zofran [Ondansetron] Shortness Of Breath    "felt like throat was closing"  . Clarithromycin Diarrhea and Nausea And Vomiting  . Latex Rash  . Macrodantin [Nitrofurantoin] Rash    Past Medical History, Surgical history, Social history, and Family History were reviewed and updated.  Review of Systems: Review of Systems  Constitutional: Negative.   HENT: Negative.   Eyes: Negative.   Respiratory: Negative.   Cardiovascular: Negative.   Gastrointestinal: Negative.   Genitourinary: Negative.   Musculoskeletal: Negative.   Skin: Negative.   Neurological:  Negative.   Endo/Heme/Allergies: Negative.   Psychiatric/Behavioral: Negative.       Physical Exam:  vitals were not taken for this visit.   Wt Readings from Last 3 Encounters:  04/01/19 216 lb 0.6 oz (98 kg)  03/11/19 213 lb 8 oz (96.8 kg)  02/18/19 205 lb (93 kg)    Physical Exam Vitals signs reviewed.  Constitutional:      Comments: Her breast exam shows left breast no masses, edema or erythema.  There is no left axillary adenopathy.  Right breast shows a well-healed lumpectomy at about the 7 o'clock position.  She has some hyperpigmentation of the right breast.  There is some slight firmness of the right breast from radiation.  No distinct masses noted in the right breast.  There is no right axillary adenopathy.  HENT:     Head: Normocephalic and atraumatic.  Eyes:     Pupils: Pupils are equal, round, and reactive to light.  Neck:     Musculoskeletal: Normal range of motion.  Cardiovascular:     Rate and Rhythm: Normal rate and regular rhythm.     Heart sounds: Normal heart sounds.  Pulmonary:     Effort: Pulmonary effort is normal.     Breath sounds: Normal breath sounds.  Abdominal:     General: Bowel sounds are normal.     Palpations: Abdomen is soft.  Musculoskeletal: Normal range of motion.        General: No tenderness or deformity.  Lymphadenopathy:     Cervical: No cervical adenopathy.  Skin:    General: Skin is warm and dry.     Findings: No erythema or rash.  Neurological:     Mental Status: She is alert and oriented to person, place, and time.  Psychiatric:        Behavior: Behavior normal.        Thought Content: Thought content normal.        Judgment: Judgment normal.      Lab Results  Component Value Date   WBC 7.7 04/22/2019   HGB 13.2 04/22/2019   HCT 39.7 04/22/2019   MCV 100.3 (H) 04/22/2019   PLT 192 04/22/2019   Lab Results  Component Value Date   FERRITIN 820 (H) 05/17/2018   IRON 272 (H) 05/17/2018   TIBC 295 05/17/2018   UIBC  23 05/17/2018   IRONPCTSAT 92 (H) 05/17/2018   Lab Results  Component Value Date   RETICCTPCT 1.3 03/27/2011   RBC 3.96 04/22/2019   RETICCTABS 56.4 03/27/2011   Lab Results  Component Value Date   KPAFRELGTCHN 44.3 (H) 04/01/2019   LAMBDASER 24.2 04/01/2019   KAPLAMBRATIO 1.83 (H) 04/01/2019   Lab Results  Component Value Date   IGGSERUM 1,008 04/01/2019   IGA 170 04/01/2019   IGMSERUM 134 04/01/2019   Lab Results  Component Value Date   TOTALPROTELP  6.4 04/01/2019   ALBUMINELP 3.3 04/01/2019   A1GS 0.2 04/01/2019   A2GS 0.8 04/01/2019   BETS 1.2 04/01/2019   BETA2SER 0.4 07/02/2015   GAMS 1.0 04/01/2019   MSPIKE Not Observed 04/01/2019   SPEI Comment 03/11/2019     Chemistry      Component Value Date/Time   NA 138 04/22/2019 0830   NA 141 07/06/2017 0816   NA 141 01/05/2017 0956   K 4.1 04/22/2019 0830   K 3.6 07/06/2017 0816   K 4.3 01/05/2017 0956   CL 102 04/22/2019 0830   CL 101 07/06/2017 0816   CO2 27 04/22/2019 0830   CO2 28 07/06/2017 0816   CO2 23 01/05/2017 0956   BUN 35 (H) 04/22/2019 0830   BUN 28 (H) 07/06/2017 0816   BUN 22.4 01/05/2017 0956   CREATININE 1.57 (H) 04/22/2019 0830   CREATININE 1.6 (H) 07/06/2017 0816   CREATININE 1.4 (H) 01/05/2017 0956      Component Value Date/Time   CALCIUM 9.1 04/22/2019 0830   CALCIUM 9.1 07/06/2017 0816   CALCIUM 9.3 01/05/2017 0956   ALKPHOS 56 04/22/2019 0830   ALKPHOS 58 07/06/2017 0816   ALKPHOS 52 01/05/2017 0956   AST 16 04/22/2019 0830   AST 14 01/05/2017 0956   ALT 16 04/22/2019 0830   ALT 58 (H) 07/06/2017 0816   ALT 13 01/05/2017 0956   BILITOT 0.5 04/22/2019 0830   BILITOT 0.56 01/05/2017 0956       Impression and Plan: Ms. Schwark is a very pleasant 68 yo caucasian female with past history of kappa light chain myeloma with stem cell transplant. 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   So far, she is done quite well.  I do not see any evidence of recurrent disease with her breast cancer.  She will finish her Herceptin today.  We will then follow her along.  Again, I just do not think that she will need Nerlynx.  Since she had a complete response to treatment and had negative lymph nodes, I just do not think that Nerlynx is going to add any additional benefit to what she has had already.  Her myeloma is doing okay.  So far there is no evidence of recurrence of the myeloma.  We will plan to get her back after Labor Day.  She will make it through the summer now.  She still needs her Port-A-Cath flushed.    [name], MD 5/29/202010:00 AM

## 2019-04-22 NOTE — Patient Instructions (Signed)

## 2019-04-23 LAB — IGG, IGA, IGM
IgA: 173 mg/dL (ref 87–352)
IgG (Immunoglobin G), Serum: 969 mg/dL (ref 586–1602)
IgM (Immunoglobulin M), Srm: 127 mg/dL (ref 26–217)

## 2019-04-25 LAB — PROTEIN ELECTROPHORESIS, SERUM
A/G Ratio: 1.3 (ref 0.7–1.7)
Albumin ELP: 3.5 g/dL (ref 2.9–4.4)
Alpha-1-Globulin: 0.1 g/dL (ref 0.0–0.4)
Alpha-2-Globulin: 0.7 g/dL (ref 0.4–1.0)
Beta Globulin: 1.1 g/dL (ref 0.7–1.3)
Gamma Globulin: 0.9 g/dL (ref 0.4–1.8)
Globulin, Total: 2.8 g/dL (ref 2.2–3.9)
Total Protein ELP: 6.3 g/dL (ref 6.0–8.5)

## 2019-04-25 LAB — KAPPA/LAMBDA LIGHT CHAINS
Kappa free light chain: 43.1 mg/L — ABNORMAL HIGH (ref 3.3–19.4)
Kappa, lambda light chain ratio: 1.83 — ABNORMAL HIGH (ref 0.26–1.65)
Lambda free light chains: 23.6 mg/L (ref 5.7–26.3)

## 2019-05-19 ENCOUNTER — Other Ambulatory Visit: Payer: Self-pay | Admitting: Hematology & Oncology

## 2019-05-19 DIAGNOSIS — D649 Anemia, unspecified: Secondary | ICD-10-CM

## 2019-06-03 ENCOUNTER — Other Ambulatory Visit: Payer: Self-pay

## 2019-06-03 ENCOUNTER — Inpatient Hospital Stay: Payer: 59 | Attending: Hematology & Oncology

## 2019-06-03 VITALS — BP 142/73 | HR 71 | Temp 98.2°F | Resp 20

## 2019-06-03 DIAGNOSIS — Z452 Encounter for adjustment and management of vascular access device: Secondary | ICD-10-CM | POA: Insufficient documentation

## 2019-06-03 DIAGNOSIS — C50411 Malignant neoplasm of upper-outer quadrant of right female breast: Secondary | ICD-10-CM | POA: Diagnosis present

## 2019-06-03 DIAGNOSIS — C9001 Multiple myeloma in remission: Secondary | ICD-10-CM

## 2019-06-03 MED ORDER — SODIUM CHLORIDE 0.9% FLUSH
10.0000 mL | INTRAVENOUS | Status: DC | PRN
  Administered 2019-06-03: 10 mL
  Filled 2019-06-03: qty 10

## 2019-06-03 MED ORDER — ANTICOAGULANT SODIUM CITRATE 4% (200MG/5ML) IV SOLN
5.0000 mL | Freq: Once | Status: AC
  Administered 2019-06-03: 5 mL via INTRAVENOUS
  Filled 2019-06-03: qty 5

## 2019-06-03 NOTE — Patient Instructions (Signed)

## 2019-06-04 ENCOUNTER — Encounter: Payer: Self-pay | Admitting: Family

## 2019-06-09 ENCOUNTER — Other Ambulatory Visit: Payer: Self-pay | Admitting: Family

## 2019-06-09 DIAGNOSIS — C50911 Malignant neoplasm of unspecified site of right female breast: Secondary | ICD-10-CM

## 2019-06-09 MED ORDER — AMOXICILLIN 500 MG PO TABS: 2000.0000 mg | ORAL_TABLET | Freq: Once | ORAL | 0 refills | Status: AC

## 2019-07-15 ENCOUNTER — Inpatient Hospital Stay: Payer: 59

## 2019-07-15 ENCOUNTER — Other Ambulatory Visit: Payer: Self-pay

## 2019-07-15 ENCOUNTER — Encounter: Payer: Self-pay | Admitting: Hematology & Oncology

## 2019-07-15 ENCOUNTER — Inpatient Hospital Stay: Payer: 59 | Attending: Hematology & Oncology | Admitting: Hematology & Oncology

## 2019-07-15 VITALS — BP 134/68 | HR 84 | Temp 97.6°F | Resp 17 | Ht 59.0 in | Wt 220.1 lb

## 2019-07-15 VITALS — BP 134/68 | HR 84 | Temp 97.6°F | Resp 17

## 2019-07-15 DIAGNOSIS — C9001 Multiple myeloma in remission: Secondary | ICD-10-CM

## 2019-07-15 DIAGNOSIS — C50411 Malignant neoplasm of upper-outer quadrant of right female breast: Secondary | ICD-10-CM | POA: Insufficient documentation

## 2019-07-15 DIAGNOSIS — Z9221 Personal history of antineoplastic chemotherapy: Secondary | ICD-10-CM | POA: Diagnosis not present

## 2019-07-15 DIAGNOSIS — Z9484 Stem cells transplant status: Secondary | ICD-10-CM | POA: Insufficient documentation

## 2019-07-15 DIAGNOSIS — C50911 Malignant neoplasm of unspecified site of right female breast: Secondary | ICD-10-CM | POA: Diagnosis not present

## 2019-07-15 DIAGNOSIS — Z171 Estrogen receptor negative status [ER-]: Secondary | ICD-10-CM | POA: Diagnosis not present

## 2019-07-15 DIAGNOSIS — Z86711 Personal history of pulmonary embolism: Secondary | ICD-10-CM | POA: Diagnosis not present

## 2019-07-15 DIAGNOSIS — Z79899 Other long term (current) drug therapy: Secondary | ICD-10-CM | POA: Insufficient documentation

## 2019-07-15 DIAGNOSIS — Z452 Encounter for adjustment and management of vascular access device: Secondary | ICD-10-CM | POA: Diagnosis not present

## 2019-07-15 DIAGNOSIS — Z7901 Long term (current) use of anticoagulants: Secondary | ICD-10-CM | POA: Insufficient documentation

## 2019-07-15 DIAGNOSIS — Z86718 Personal history of other venous thrombosis and embolism: Secondary | ICD-10-CM | POA: Diagnosis not present

## 2019-07-15 DIAGNOSIS — I824Z3 Acute embolism and thrombosis of unspecified deep veins of distal lower extremity, bilateral: Secondary | ICD-10-CM

## 2019-07-15 LAB — CBC WITH DIFFERENTIAL (CANCER CENTER ONLY)
Abs Immature Granulocytes: 0.03 10*3/uL (ref 0.00–0.07)
Basophils Absolute: 0 10*3/uL (ref 0.0–0.1)
Basophils Relative: 0 %
Eosinophils Absolute: 0.2 10*3/uL (ref 0.0–0.5)
Eosinophils Relative: 2 %
HCT: 43.4 % (ref 36.0–46.0)
Hemoglobin: 14.2 g/dL (ref 12.0–15.0)
Immature Granulocytes: 0 %
Lymphocytes Relative: 52 %
Lymphs Abs: 4.2 10*3/uL — ABNORMAL HIGH (ref 0.7–4.0)
MCH: 32.9 pg (ref 26.0–34.0)
MCHC: 32.7 g/dL (ref 30.0–36.0)
MCV: 100.7 fL — ABNORMAL HIGH (ref 80.0–100.0)
Monocytes Absolute: 0.5 10*3/uL (ref 0.1–1.0)
Monocytes Relative: 6 %
Neutro Abs: 3.2 10*3/uL (ref 1.7–7.7)
Neutrophils Relative %: 40 %
Platelet Count: 193 10*3/uL (ref 150–400)
RBC: 4.31 MIL/uL (ref 3.87–5.11)
RDW: 13.2 % (ref 11.5–15.5)
WBC Count: 8.1 10*3/uL (ref 4.0–10.5)
nRBC: 0 % (ref 0.0–0.2)

## 2019-07-15 LAB — CMP (CANCER CENTER ONLY)
ALT: 18 U/L (ref 0–44)
AST: 16 U/L (ref 15–41)
Albumin: 3.8 g/dL (ref 3.5–5.0)
Alkaline Phosphatase: 52 U/L (ref 38–126)
Anion gap: 10 (ref 5–15)
BUN: 27 mg/dL — ABNORMAL HIGH (ref 8–23)
CO2: 27 mmol/L (ref 22–32)
Calcium: 8.6 mg/dL — ABNORMAL LOW (ref 8.9–10.3)
Chloride: 103 mmol/L (ref 98–111)
Creatinine: 1.48 mg/dL — ABNORMAL HIGH (ref 0.44–1.00)
GFR, Est AFR Am: 41 mL/min — ABNORMAL LOW (ref >60)
GFR, Estimated: 36 mL/min — ABNORMAL LOW (ref >60)
Glucose, Bld: 92 mg/dL (ref 70–99)
Potassium: 4.4 mmol/L (ref 3.5–5.1)
Sodium: 140 mmol/L (ref 135–145)
Total Bilirubin: 0.5 mg/dL (ref 0.3–1.2)
Total Protein: 6.9 g/dL (ref 6.5–8.1)

## 2019-07-15 MED ORDER — ANTICOAGULANT SODIUM CITRATE 4% (200MG/5ML) IV SOLN
5.0000 mL | Freq: Once | Status: AC
  Administered 2019-07-15: 5 mL via INTRAVENOUS
  Filled 2019-07-15: qty 5

## 2019-07-15 MED ORDER — SODIUM CHLORIDE 0.9% FLUSH
10.0000 mL | INTRAVENOUS | Status: DC | PRN
  Administered 2019-07-15: 10 mL
  Filled 2019-07-15: qty 10

## 2019-07-15 NOTE — Patient Instructions (Signed)

## 2019-07-15 NOTE — Progress Notes (Signed)
Hematology and Oncology Follow Up Visit  Marcia [name] [id number] Jul 05, 1951 68 y.o. 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  He is doing quite well.  He really has had no problems since we last saw her.  She has had no bleeding.  She is on Eliquis.  She is doing well on the Eliquis.  She has had no issues with cough or shortness of breath.  She has had no nausea or vomiting.  She has had no change in bowel or bladder habits.  Her myeloma has been nice and stable.  Her last myeloma studies back in May showed a kappa light chain of 4.3 mg/dL.  Her last echocardiogram was back in February.  The left ventricular ejection fraction was 55 to 60%.  Overall, her lymphoma status is ECOG 1.    Medications:  Allergies as of 07/15/2019      Reactions   Bactrim [sulfamethoxazole-trimethoprim] Other (See Comments)   ? Renal failure.   Heparin Other (See Comments)   HIT   Zofran [ondansetron] Shortness Of Breath   "felt like throat was closing"   Clarithromycin Diarrhea, Nausea And Vomiting   Latex Rash   Macrodantin [nitrofurantoin] Rash      Medication List       Accurate as of July 15, 2019  8:47 AM. If you have any questions, ask your nurse or doctor.        acetaminophen 325 MG tablet Commonly known as: TYLENOL Take 650 mg by mouth daily as needed for moderate pain or  headache.   amoxicillin 500 MG capsule Commonly known as: AMOXIL Prior to dentist   apixaban 2.5 MG Tabs tablet Commonly known as: ELIQUIS Take 1 tablet (2.5 mg total) by mouth 2 (two) times daily.   doxylamine (Sleep) 25 MG tablet Commonly known as: UNISOM Take 25 mg by mouth at bedtime as needed for sleep.   feeding supplement (ENSURE ENLIVE) Liqd Take 237 mLs by mouth 3 (three) times daily between meals. What changed: when to take this   folic acid 1 MG tablet Commonly known as: FOLVITE TAKE 2 TABLETS (2 MG TOTAL) BY MOUTH DAILY.   lidocaine-prilocaine cream Commonly known as: EMLA Apply 1 application topically as needed (local anesthetic).   nystatin 100000 UNIT/ML suspension Commonly known as: MYCOSTATIN   vitamin C 250 MG tablet Commonly known as: ASCORBIC ACID Take 250 mg by mouth 2 (two) times daily.       Allergies:  Allergies  Allergen Reactions   Bactrim [Sulfamethoxazole-Trimethoprim] Other (See Comments)    ? Renal failure.   Heparin Other (See Comments)    HIT   Zofran [Ondansetron] Shortness Of Breath    "felt like throat was closing"   Clarithromycin Diarrhea and Nausea And Vomiting   Latex Rash   Macrodantin [Nitrofurantoin] Rash    Past Medical History, Surgical history, Social history, and Family History were reviewed and updated.  Review of Systems: Review of Systems  Constitutional: Negative.   HENT:  Negative.   Eyes: Negative.   Respiratory: Negative.   Cardiovascular: Negative.   Gastrointestinal: Negative.   Genitourinary: Negative.   Musculoskeletal: Negative.   Skin: Negative.   Neurological: Negative.   Endo/Heme/Allergies: Negative.   Psychiatric/Behavioral: Negative.       Physical Exam:  vitals were not taken for this visit.   Wt Readings from Last 3 Encounters:  04/01/19 216 lb 0.6 oz (98 kg)  03/11/19 213 lb 8 oz (96.8 kg)  02/18/19 205 lb (93 kg)    Physical Exam Vitals signs reviewed.    Constitutional:      Comments: Her breast exam shows left breast no masses, edema or erythema.  There is no left axillary adenopathy.  Right breast shows a well-healed lumpectomy at about the 7 o'clock position.  She has some hyperpigmentation of the right breast.  There is some slight firmness of the right breast from radiation.  No distinct masses noted in the right breast.  There is no right axillary adenopathy.  HENT:     Head: Normocephalic and atraumatic.  Eyes:     Pupils: Pupils are equal, round, and reactive to light.  Neck:     Musculoskeletal: Normal range of motion.  Cardiovascular:     Rate and Rhythm: Normal rate and regular rhythm.     Heart sounds: Normal heart sounds.  Pulmonary:     Effort: Pulmonary effort is normal.     Breath sounds: Normal breath sounds.  Abdominal:     General: Bowel sounds are normal.     Palpations: Abdomen is soft.  Musculoskeletal: Normal range of motion.        General: No tenderness or deformity.  Lymphadenopathy:     Cervical: No cervical adenopathy.  Skin:    General: Skin is warm and dry.     Findings: No erythema or rash.  Neurological:     Mental Status: She is alert and oriented to person, place, and time.  Psychiatric:        Behavior: Behavior normal.        Thought Content: Thought content normal.        Judgment: Judgment normal.      Lab Results  Component Value Date   WBC 8.1 07/15/2019   HGB 14.2 07/15/2019   HCT 43.4 07/15/2019   MCV 100.7 (H) 07/15/2019   PLT 193 07/15/2019   Lab Results  Component Value Date   FERRITIN 820 (H) 05/17/2018   IRON 272 (H) 05/17/2018   TIBC 295 05/17/2018   UIBC 23 05/17/2018   IRONPCTSAT 92 (H) 05/17/2018   Lab Results  Component Value Date   RETICCTPCT 1.3 03/27/2011   RBC 4.31 07/15/2019   RETICCTABS 56.4 03/27/2011   Lab Results  Component Value Date   KPAFRELGTCHN 43.1 (H) 04/22/2019   LAMBDASER 23.6 04/22/2019   KAPLAMBRATIO 1.83 (H) 04/22/2019   Lab  Results  Component Value Date   IGGSERUM 969 04/22/2019   IGA 173 04/22/2019   IGMSERUM 127 04/22/2019   Lab Results  Component Value Date   TOTALPROTELP 6.3 04/22/2019   ALBUMINELP 3.5 04/22/2019   A1GS 0.1 04/22/2019   A2GS 0.7 04/22/2019   BETS 1.1 04/22/2019   BETA2SER 0.4 07/02/2015   GAMS 0.9 04/22/2019   MSPIKE Not Observed 04/22/2019   SPEI Comment 04/22/2019     Chemistry      Component Value Date/Time   NA 140 07/15/2019 0754   NA 141 07/06/2017 0816   NA 141 01/05/2017  0956   K 4.4 07/15/2019 0754   K 3.6 07/06/2017 0816   K 4.3 01/05/2017 0956   CL 103 07/15/2019 0754   CL 101 07/06/2017 0816   CO2 27 07/15/2019 0754   CO2 28 07/06/2017 0816   CO2 23 01/05/2017 0956   BUN 27 (H) 07/15/2019 0754   BUN 28 (H) 07/06/2017 0816   BUN 22.4 01/05/2017 0956   CREATININE 1.48 (H) 07/15/2019 0754   CREATININE 1.6 (H) 07/06/2017 0816   CREATININE 1.4 (H) 01/05/2017 0956      Component Value Date/Time   CALCIUM 8.6 (L) 07/15/2019 0754   CALCIUM 9.1 07/06/2017 0816   CALCIUM 9.3 01/05/2017 0956   ALKPHOS 52 07/15/2019 0754   ALKPHOS 58 07/06/2017 0816   ALKPHOS 52 01/05/2017 0956   AST 16 07/15/2019 0754   AST 14 01/05/2017 0956   ALT 18 07/15/2019 0754   ALT 58 (H) 07/06/2017 0816   ALT 13 01/05/2017 0956   BILITOT 0.5 07/15/2019 0754   BILITOT 0.56 01/05/2017 0956       Impression and Plan: Marcia [name] is a very pleasant 68 yo caucasian female with past history of kappa light chain myeloma with stem cell transplant. 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  So far, she is done quite well.  I do not see any evidence of recurrent disease with her breast cancer.  I will plan to see her back in 3 months.  In 3 months would be perfect..  She still has the Port-A-Cath in.  We will make sure that she gets this flush in 6 weeks.  [name], MD 8/21/20208:47 AM

## 2019-07-16 LAB — IGG, IGA, IGM
IgA: 165 mg/dL (ref 87–352)
IgG (Immunoglobin G), Serum: 900 mg/dL (ref 586–1602)
IgM (Immunoglobulin M), Srm: 126 mg/dL (ref 26–217)

## 2019-07-18 LAB — PROTEIN ELECTROPHORESIS, SERUM, WITH REFLEX
A/G Ratio: 1.4 (ref 0.7–1.7)
Albumin ELP: 3.6 g/dL (ref 2.9–4.4)
Alpha-1-Globulin: 0.1 g/dL (ref 0.0–0.4)
Alpha-2-Globulin: 0.7 g/dL (ref 0.4–1.0)
Beta Globulin: 1.1 g/dL (ref 0.7–1.3)
Gamma Globulin: 0.8 g/dL (ref 0.4–1.8)
Globulin, Total: 2.6 g/dL (ref 2.2–3.9)
Total Protein ELP: 6.2 g/dL (ref 6.0–8.5)

## 2019-07-18 LAB — KAPPA/LAMBDA LIGHT CHAINS
Kappa free light chain: 38.9 mg/L — ABNORMAL HIGH (ref 3.3–19.4)
Kappa, lambda light chain ratio: 1.44 (ref 0.26–1.65)
Lambda free light chains: 27 mg/L — ABNORMAL HIGH (ref 5.7–26.3)

## 2019-08-12 IMAGING — MG MM PLC BREAST LOC DEV 1ST LESION INC*R*
5 series · 5 of 5 positions shown · non-contrast
Comparison: Previous exam(s).

CLINICAL DATA: Biopsy-proven invasive ductal carcinoma, DCIS with
lymphovascular and perineural invasion on 01/19/2018. Patient has
undergone neoadjuvant chemotherapy with excellent response on recent
MRI. Radioactive seed localization is performed in anticipation of
RIGHT breast lumpectomy on [REDACTED], 07/05/2018.

EXAM:
MAMMOGRAPHIC GUIDED RADIOACTIVE SEED LOCALIZATION OF THE RIGHT
BREAST

[R LM]
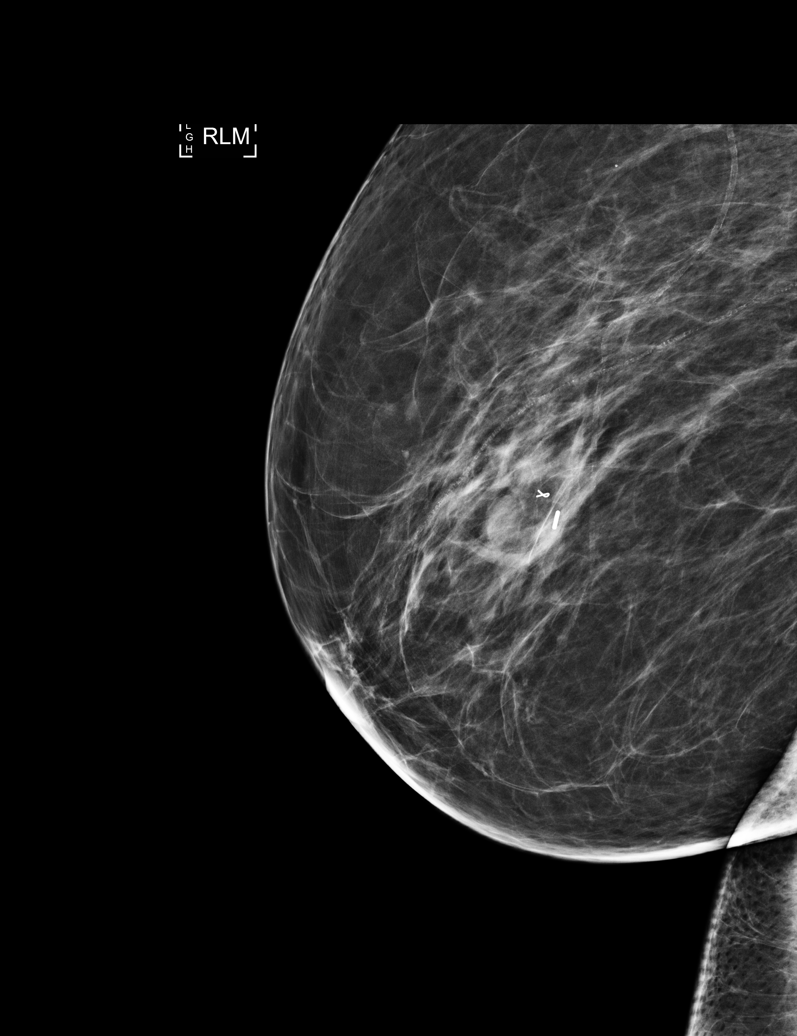

[R CC (1 of 4)]
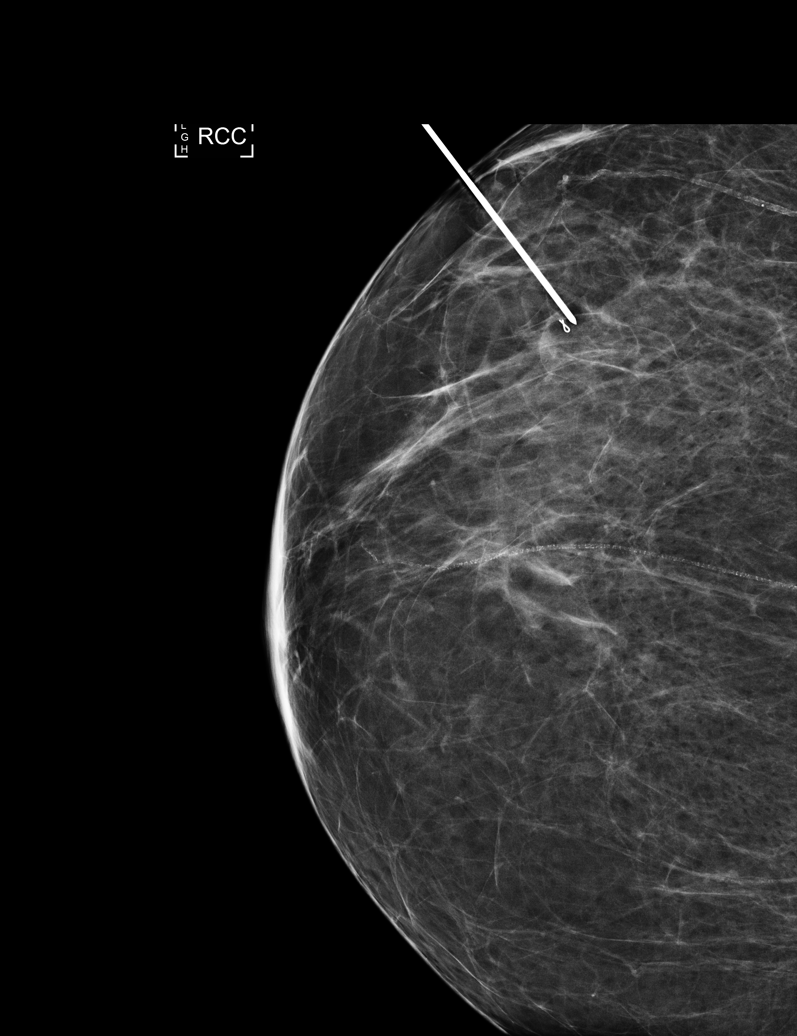

[R CC (2 of 4)]
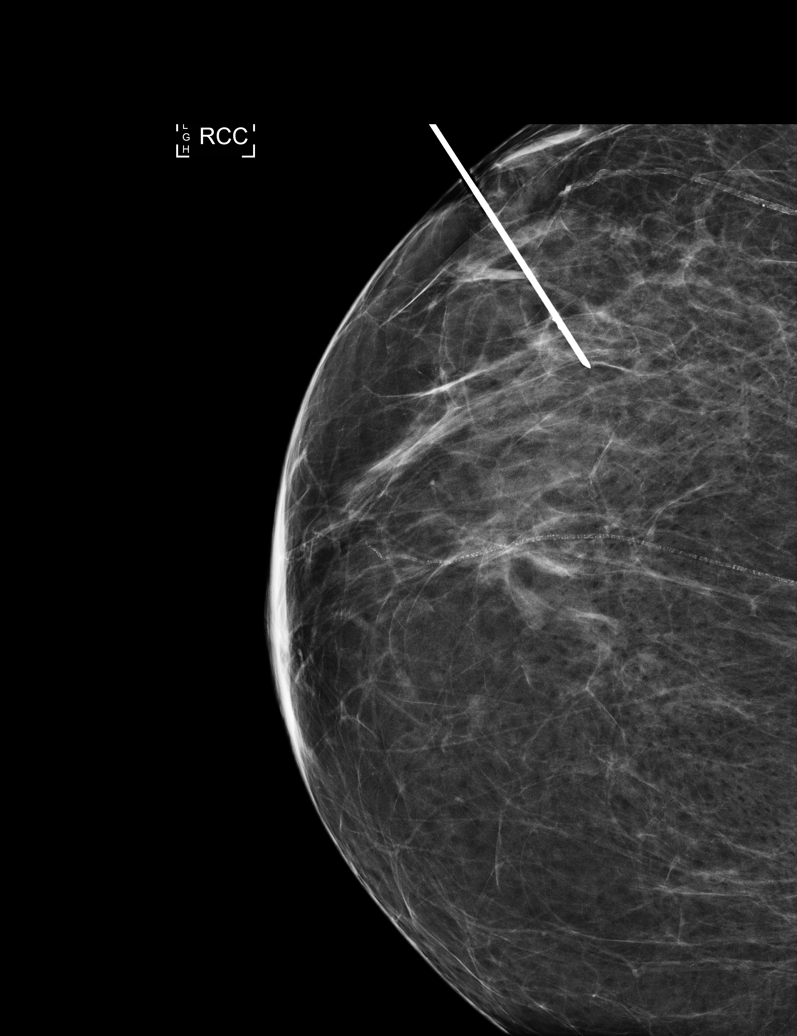

[R CC (3 of 4)]
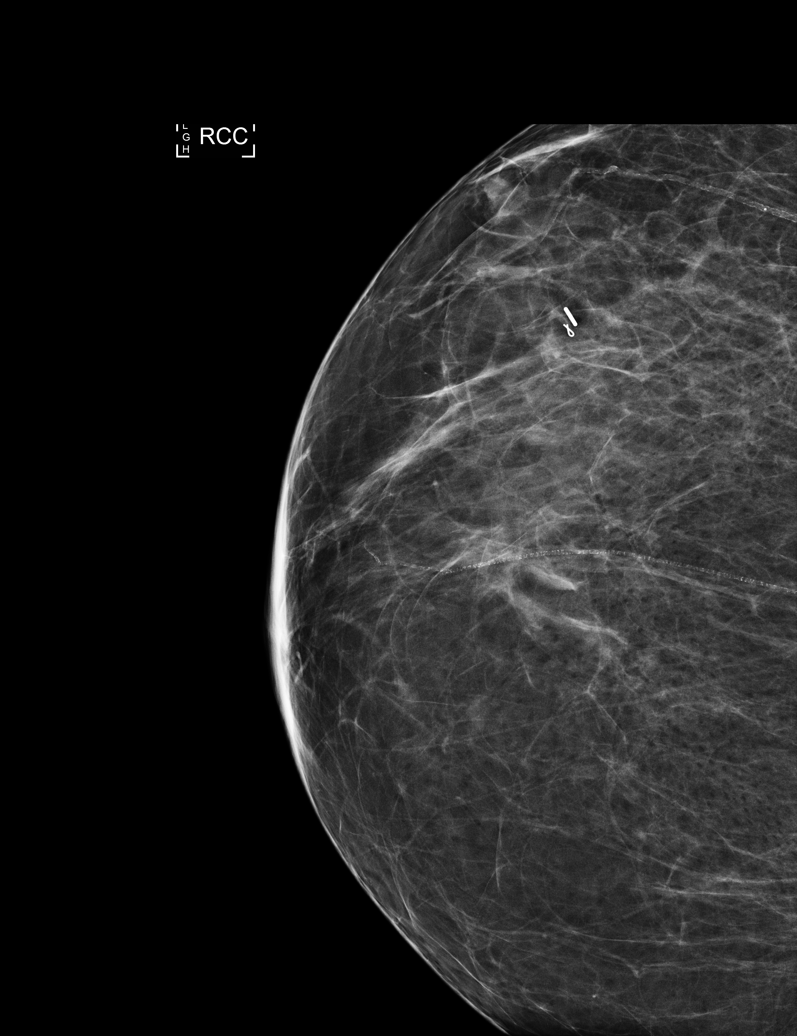

[R CC (4 of 4)]
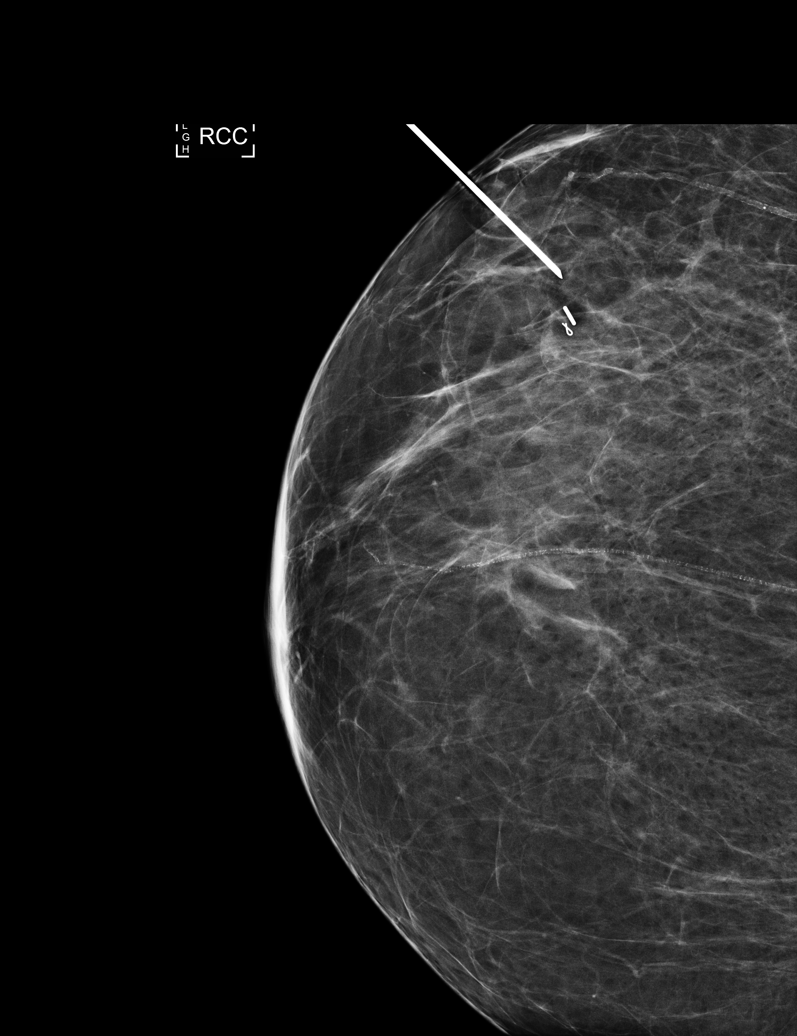

[5 of 5 positions shown; findings below may reference images not displayed]

FINDINGS: Patient presents for radioactive seed localization prior to RIGHT
breast lumpectomy. I met with the patient and we discussed the
procedure of seed localization including benefits and alternatives.
We discussed the high likelihood of a successful procedure. We
discussed the risks of the procedure including infection, bleeding,
tissue injury and further surgery. We discussed the low dose of
radioactivity involved in the procedure. Informed, written consent
was given.

The usual time-out protocol was performed immediately prior to the
procedure.

Using mammographic guidance, sterile technique with chlorhexidine as
skin antisepsis, 1% lidocaine as local anesthesia, an 3-3BH
radioactive seed was used to localize the ribbon shaped tissue
marker clip associated with the biopsy-proven malignancy using a
LATERAL approach. The follow-up mammogram images confirm the seed in
the expected location approximately 3 mm INFERIOR to the ribbon
clip. The images are marked for Dr. Paneso.

Follow-up survey of the patient confirms the presence of the
radioactive seed.

Order number of 3-3BH seed: [id number]

Total activity: 0.248 mCi

Reference Date: 06/22/2018

The patient tolerated the procedure well and was released from the
[REDACTED]. She was given instructions regarding seed removal.
IMPRESSION: Radioactive seed localization of the RIGHT breast. No apparent
complications.

## 2019-08-26 ENCOUNTER — Inpatient Hospital Stay: Payer: 59 | Attending: Hematology & Oncology

## 2019-08-26 ENCOUNTER — Other Ambulatory Visit: Payer: Self-pay

## 2019-08-26 VITALS — BP 156/79 | HR 68 | Temp 97.7°F | Resp 18

## 2019-08-26 DIAGNOSIS — Z23 Encounter for immunization: Secondary | ICD-10-CM | POA: Diagnosis not present

## 2019-08-26 DIAGNOSIS — C9001 Multiple myeloma in remission: Secondary | ICD-10-CM

## 2019-08-26 DIAGNOSIS — Z452 Encounter for adjustment and management of vascular access device: Secondary | ICD-10-CM | POA: Diagnosis not present

## 2019-08-26 DIAGNOSIS — Z9221 Personal history of antineoplastic chemotherapy: Secondary | ICD-10-CM | POA: Diagnosis not present

## 2019-08-26 DIAGNOSIS — Z171 Estrogen receptor negative status [ER-]: Secondary | ICD-10-CM | POA: Insufficient documentation

## 2019-08-26 DIAGNOSIS — C50411 Malignant neoplasm of upper-outer quadrant of right female breast: Secondary | ICD-10-CM | POA: Diagnosis not present

## 2019-08-26 DIAGNOSIS — Z9484 Stem cells transplant status: Secondary | ICD-10-CM | POA: Insufficient documentation

## 2019-08-26 MED ORDER — SODIUM CHLORIDE 0.9% FLUSH
10.0000 mL | INTRAVENOUS | Status: DC | PRN
  Administered 2019-08-26 (×2): 10 mL
  Filled 2019-08-26: qty 10

## 2019-08-26 MED ORDER — INFLUENZA VAC SPLIT QUAD 0.5 ML IM SUSY
PREFILLED_SYRINGE | INTRAMUSCULAR | Status: AC
  Filled 2019-08-26: qty 0.5

## 2019-08-26 MED ORDER — ANTICOAGULANT SODIUM CITRATE 4% (200MG/5ML) IV SOLN: 5.0000 mL | Freq: Once | Status: DC

## 2019-08-26 MED ORDER — SODIUM CHLORIDE 0.9% FLUSH
10.0000 mL | Freq: Once | INTRAVENOUS | Status: DC
  Filled 2019-08-26: qty 10

## 2019-08-26 MED ORDER — INFLUENZA VAC SPLIT QUAD 0.5 ML IM SUSY
0.5000 mL | PREFILLED_SYRINGE | Freq: Once | INTRAMUSCULAR | Status: AC
  Administered 2019-08-26: 0.5 mL via INTRAMUSCULAR

## 2019-08-26 NOTE — Patient Instructions (Signed)
Preventing Influenza, Adult Influenza, more commonly known as "the flu," is a viral infection that mainly affects the respiratory tract. The respiratory tract includes structures that help you breathe, such as the lungs, nose, and throat. The flu causes many common cold symptoms, as well as a high fever and body aches. The flu spreads easily from person to person (is contagious). The flu is most common from December through March. This is called flu season.You can catch the flu virus by:  Breathing in droplets from an infected person's cough or sneeze.  Touching something that was recently contaminated with the virus and then touching your mouth, nose, or eyes. What can I do to lower my risk?        You can decrease your risk of getting the flu by:  Getting a flu shot (influenza vaccination) every year. This is the best way to prevent the flu. A flu shot is recommended for everyone age 6 months and older. ? It is best to get a flu shot in the fall, as soon as it is available. Getting a flu shot during winter or spring instead is still a good idea. Flu season can last into early spring. ? Preventing the flu through vaccination requires getting a new flu shot every year. This is because the flu virus changes slightly (mutates) from one year to the next. Even if a flu shot does not completely protect you from all flu virus mutations, it can reduce the severity of your illness and prevent dangerous complications of the flu. ? If you are pregnant, you can and should get a flu shot. ? If you have had a reaction to the shot in the past or if you are allergic to eggs, check with your health care provider before getting a flu shot. ? Sometimes the vaccine is available as a nasal spray. In some years, the nasal spray has not been as effective against the flu virus. Check with your health care provider if you have questions about this.  Practicing good health habits. This is especially important during  flu season. ? Avoid contact with people who are sick with flu or cold symptoms. ? Wash your hands with soap and water often. If soap and water are not available, use alcohol-based hand sanitizer. ? Avoid touching your hands to your face, especially when you have not washed your hands recently. ? Use a disinfectant to clean surfaces at home and at work that may be contaminated with the flu virus. ? Keep your body's disease-fighting system (immune system) in good shape by eating a healthy diet, drinking plenty of fluids, getting enough sleep, and exercising regularly. If you do get the flu, avoid spreading it to others by:  Staying home until your symptoms have been gone for at least one day.  Covering your mouth and nose when you cough or sneeze.  Avoiding close contact with others, especially babies and elderly people. Why are these changes important? Getting a flu shot and practicing good health habits protects you as well as other people. If you get the flu, your friends, family, and co-workers are also at risk of getting it, because it spreads so easily to others. Each year, about 2 out of every 10 people get the flu. Having the flu can lead to complications, such as pneumonia, ear infection, and sinus infection. The flu also can be deadly, especially for babies, people older than age 65, and people who have serious long-term diseases. How is this treated? Most   people recover from the flu by resting at home and drinking plenty of fluids. However, a prescription antiviral medicine may reduce your flu symptoms and may make your flu go away sooner. This medicine must be started within a few days of getting flu symptoms. You can talk with your health care provider about whether you need an antiviral medicine. Antiviral medicine may be prescribed for people who are at risk for more serious flu symptoms. This includes people who:  Are older than age 69.  Are pregnant.  Have a condition that  makes the flu worse or more dangerous. Where to find more information  Centers for Disease Control and Prevention: http://www.smith-bell.org/  [employer].com.ee: azureicus.com  American Academy of Family Physicians: familydoctor.org/familydoctor/en/kids/vaccines/preventing-the-flu.html Contact a health care provider if:  You have influenza and you develop new symptoms.  You have: ? Chest pain. ? Diarrhea. ? A fever.  Your cough gets worse, or you produce more mucus. Summary  The best way to prevent the flu is to get a flu shot every year in the fall.  Even if you get the flu after you have received the yearly vaccine, your flu may be milder and go away sooner because of your flu shot.  If you get the flu, antiviral medicines that are started with a few days of symptoms may reduce your flu symptoms and may make your flu go away sooner.  You can also help prevent the flu by practicing good health habits. This information is not intended to replace advice given to you by your health care provider. Make sure you discuss any questions you have with your health care provider. Document Released: 11/25/2015 Document Revised: 10/23/2017 Document Reviewed: 07/19/2016 Elsevier Patient Education  2020 [place] Venous Catheter Flushing Guide  It is important to flush your tunneled central venous catheter each time you use it, both before and after you use it. Flushing your catheter will help prevent it from clogging. What are the risks? Risks may include:  Infection.  Air getting into the catheter and bloodstream. Supplies needed:  A clean pair of gloves.  A disinfecting wipe. Use an alcohol wipe, chlorhexidine wipe, or iodine wipe as told by your health care provider.  A 10 mL syringe that has been prefilled with saline solution.  An empty 10 mL syringe, if a substance called heparin was injected into your catheter. How to flush your catheter When  you flush your catheter, make sure you follow any specific instructions from your health care provider or the manufacturer. These are general guidelines. Flushing your catheter before use If there is heparin in your catheter: 1. Wash your hands with soap and water. 2. Put on gloves. 3. Scrub the injection cap for a minimum of 15 seconds with a disinfecting wipe. 4. Unclamp the catheter. 5. Attach the empty syringe to the injection cap. 6. Pull the syringe plunger back and withdraw 10 mL of blood. 7. Place the syringe into an appropriate waste container. 8. Scrub the injection cap for 15 seconds with a disinfecting wipe. 9. Attach the prefilled syringe to the injection cap. 10. Flush the catheter by pushing the plunger forward until all the liquid from the syringe is in the catheter. 11. Remove the syringe from the injection cap. 12. Clamp the catheter. If there is no heparin in your catheter: 1. Wash your hands with soap and water. 2. Put on gloves. 3. Scrub the injection cap for 15 seconds with a disinfecting wipe. 4. Unclamp the catheter.  5. Attach the prefilled syringe to the injection cap. 6. Flush the catheter by pushing the plunger forward until 5 mL of the liquid from the syringe is in the catheter. 7. Pull back on the syringe until you see blood in the catheter. 8. If you have been asked to collect any blood, follow your health care provider's instructions. Otherwise, flush the catheter with the rest of the solution from the syringe. 9. Remove the syringe from the injection cap. 10. Clamp the catheter.  Flushing your catheter after use 1. Wash your hands with soap and water. 2. Put on gloves. 3. Scrub the injection cap for 15 seconds with a disinfecting wipe. 4. Unclamp the catheter. 5. Attach the prefilled syringe to the injection cap. 6. Flush the catheter by pushing the plunger forward until all of the liquid from the syringe is in the catheter. 7. Remove the syringe from  the injection cap. 8. Clamp the catheter. Problems and solutions  If blood cannot be completely cleared from the injection cap, you may need to have the injection cap replaced.  If the catheter is difficult to flush, use the pulsing method. The pulsing method involves pushing only a few milliliters of solution into the catheter at a time and pausing between pushes.  If you do not see blood in the catheter when you pull back on the syringe, change your body position, such as by raising your arms above your head. Take a deep breath and cough. Then, pull back on the syringe. If you still do not see blood, flush the catheter with a small amount of solution. Then, change positions again and take a breath or cough. Pull back on the syringe again. If you still do not see blood, finish flushing the catheter and contact your health care provider. Do not use your catheter until your health care provider says it is okay. General tips  Have someone help you flush your catheter, if possible.  Do not force fluid through your catheter.  Do not use a syringe that is larger or smaller than 10 mL. Using a smaller syringe can make the catheter burst.  Do not use your catheter without flushing it first if it has heparin in it. Contact a health care provider if:  You cannot see any blood in the catheter when you flush it before using it.  Your catheter is difficult to flush. Get help right away if:  You cannot flush the catheter.  The catheter leaks when you flush it or when there is fluid in it.  There are cracks or breaks in the catheter. Summary  It is important to flush your tunneled central venous catheter each time you use it, both before and after you use it.  Scrub the injection cap for 15 seconds with a disinfecting wipe before and after you flush it.  When you flush your catheter, make sure you follow any specific instructions from your health care provider or the manufacturer.  Get help  right away if you cannot flush the catheter. This information is not intended to replace advice given to you by your health care provider. Make sure you discuss any questions you have with your health care provider. Document Released: 10/30/2011 Document Revised: 01/26/2019 Document Reviewed: 01/26/2019 Elsevier Patient Education  [place].

## 2019-10-14 ENCOUNTER — Telehealth: Payer: Self-pay | Admitting: Hematology & Oncology

## 2019-10-14 ENCOUNTER — Inpatient Hospital Stay: Payer: 59 | Attending: Hematology & Oncology | Admitting: Hematology & Oncology

## 2019-10-14 ENCOUNTER — Encounter: Payer: Self-pay | Admitting: Hematology & Oncology

## 2019-10-14 ENCOUNTER — Inpatient Hospital Stay: Payer: 59

## 2019-10-14 ENCOUNTER — Other Ambulatory Visit: Payer: Self-pay

## 2019-10-14 VITALS — BP 151/79 | HR 76 | Temp 97.1°F | Resp 18 | Wt 225.0 lb

## 2019-10-14 DIAGNOSIS — Z452 Encounter for adjustment and management of vascular access device: Secondary | ICD-10-CM | POA: Diagnosis not present

## 2019-10-14 DIAGNOSIS — Z853 Personal history of malignant neoplasm of breast: Secondary | ICD-10-CM | POA: Insufficient documentation

## 2019-10-14 DIAGNOSIS — C50911 Malignant neoplasm of unspecified site of right female breast: Secondary | ICD-10-CM | POA: Diagnosis not present

## 2019-10-14 DIAGNOSIS — C9001 Multiple myeloma in remission: Secondary | ICD-10-CM | POA: Insufficient documentation

## 2019-10-14 DIAGNOSIS — Z171 Estrogen receptor negative status [ER-]: Secondary | ICD-10-CM | POA: Insufficient documentation

## 2019-10-14 DIAGNOSIS — Z95828 Presence of other vascular implants and grafts: Secondary | ICD-10-CM

## 2019-10-14 DIAGNOSIS — Z9484 Stem cells transplant status: Secondary | ICD-10-CM | POA: Insufficient documentation

## 2019-10-14 LAB — CBC WITH DIFFERENTIAL (CANCER CENTER ONLY)
Abs Immature Granulocytes: 0.04 10*3/uL (ref 0.00–0.07)
Basophils Absolute: 0 10*3/uL (ref 0.0–0.1)
Basophils Relative: 0 %
Eosinophils Absolute: 0.1 10*3/uL (ref 0.0–0.5)
Eosinophils Relative: 2 %
HCT: 42 % (ref 36.0–46.0)
Hemoglobin: 13.9 g/dL (ref 12.0–15.0)
Immature Granulocytes: 1 %
Lymphocytes Relative: 50 %
Lymphs Abs: 3.7 10*3/uL (ref 0.7–4.0)
MCH: 32.9 pg (ref 26.0–34.0)
MCHC: 33.1 g/dL (ref 30.0–36.0)
MCV: 99.5 fL (ref 80.0–100.0)
Monocytes Absolute: 0.4 10*3/uL (ref 0.1–1.0)
Monocytes Relative: 6 %
Neutro Abs: 2.9 10*3/uL (ref 1.7–7.7)
Neutrophils Relative %: 41 %
Platelet Count: 183 10*3/uL (ref 150–400)
RBC: 4.22 MIL/uL (ref 3.87–5.11)
RDW: 13.1 % (ref 11.5–15.5)
WBC Count: 7.2 10*3/uL (ref 4.0–10.5)
nRBC: 0 % (ref 0.0–0.2)

## 2019-10-14 LAB — CMP (CANCER CENTER ONLY)
ALT: 15 U/L (ref 0–44)
AST: 16 U/L (ref 15–41)
Albumin: 4.2 g/dL (ref 3.5–5.0)
Alkaline Phosphatase: 48 U/L (ref 38–126)
Anion gap: 9 (ref 5–15)
BUN: 28 mg/dL — ABNORMAL HIGH (ref 8–23)
CO2: 29 mmol/L (ref 22–32)
Calcium: 9.1 mg/dL (ref 8.9–10.3)
Chloride: 104 mmol/L (ref 98–111)
Creatinine: 1.47 mg/dL — ABNORMAL HIGH (ref 0.44–1.00)
GFR, Est AFR Am: 41 mL/min — ABNORMAL LOW (ref >60)
GFR, Estimated: 36 mL/min — ABNORMAL LOW (ref >60)
Glucose, Bld: 98 mg/dL (ref 70–99)
Potassium: 4.4 mmol/L (ref 3.5–5.1)
Sodium: 142 mmol/L (ref 135–145)
Total Bilirubin: 0.5 mg/dL (ref 0.3–1.2)
Total Protein: 7 g/dL (ref 6.5–8.1)

## 2019-10-14 MED ORDER — ANTICOAGULANT SODIUM CITRATE 4% (200MG/5ML) IV SOLN
5.0000 mL | Freq: Once | Status: AC
  Administered 2019-10-14: 5 mL via INTRAVENOUS
  Filled 2019-10-14: qty 5

## 2019-10-14 MED ORDER — SODIUM CHLORIDE 0.9% FLUSH
10.0000 mL | INTRAVENOUS | Status: DC | PRN
  Administered 2019-10-14: 10 mL via INTRAVENOUS
  Filled 2019-10-14: qty 10

## 2019-10-14 NOTE — Progress Notes (Signed)
Hematology and Oncology Follow Up Visit  Marcia [name] [id number] 1951-06-25 68 y.o. 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  He is doing quite well.  He really has had no problems since we last saw Marcia [name].  Marcia [name] has had no bleeding.  Marcia [name] is on Eliquis.  Marcia [name] is doing well on the Eliquis.  Marcia [name] has had no issues with cough or shortness of breath.  Marcia [name] has had no nausea or vomiting.  Marcia [name] has had no change in bowel or bladder habits.  Marcia [name] myeloma has been nice and stable.  Marcia [name] last myeloma studies back in August showed a kappa light chain of 3.9 mg/dL.  Marcia [name] last echocardiogram was back in February.  The left ventricular ejection fraction was 55 to 60%.  Overall, Marcia [name] lymphoma status is ECOG 1.    Medications:  Allergies as of 10/14/2019      Reactions   Bactrim [sulfamethoxazole-trimethoprim] Other (See Comments)   ? Renal failure.   Heparin Other (See Comments)   HIT   Zofran [ondansetron] Shortness Of Breath   "felt like throat was closing"   Clarithromycin Diarrhea, Nausea And Vomiting   Latex Rash   Macrodantin [nitrofurantoin] Rash      Medication List       Accurate as of October 14, 2019  8:41 AM. If you have any questions, ask your nurse or doctor.        acetaminophen 325 MG tablet Commonly known as: TYLENOL Take 650 mg by mouth daily as needed for moderate  pain or headache.   amoxicillin 500 MG capsule Commonly known as: AMOXIL Prior to dentist   apixaban 2.5 MG Tabs tablet Commonly known as: ELIQUIS Take 1 tablet (2.5 mg total) by mouth 2 (two) times daily.   Denta 5000 Plus 1.1 % Crea dental cream Generic drug: sodium fluoride USE AS DIRECTED ON PACKAGE TO BRUSH TEETH TWICE DAILY SPIT OUT AFTER BRUSHING   DO NOT RINSE   doxylamine (Sleep) 25 MG tablet Commonly known as: UNISOM Take 25 mg by mouth at bedtime as needed for sleep.   feeding supplement (ENSURE ENLIVE) Liqd Take 237 mLs by mouth 3 (three) times daily between meals. What changed: when to take this   folic acid 1 MG tablet Commonly known as: FOLVITE TAKE 2 TABLETS (2 MG TOTAL) BY MOUTH DAILY.   lidocaine-prilocaine cream Commonly known as: EMLA Apply 1 application topically as needed (local anesthetic).   nystatin 100000 UNIT/ML suspension Commonly known as: MYCOSTATIN   vitamin C 250 MG tablet Commonly known as: ASCORBIC ACID Take 250 mg by mouth 2 (two) times daily.       Allergies:  Allergies  Allergen Reactions  . Bactrim [Sulfamethoxazole-Trimethoprim] Other (See Comments)    ? Renal failure.  . Heparin Other (See Comments)    HIT  . Zofran [Ondansetron] Shortness Of Breath    "felt like throat was closing"  . Clarithromycin Diarrhea and Nausea And Vomiting  . Latex Rash  .  Macrodantin [Nitrofurantoin] Rash    Past Medical History, Surgical history, Social history, and Family History were reviewed and updated.  Review of Systems: Review of Systems  Constitutional: Negative.   HENT: Negative.   Eyes: Negative.   Respiratory: Negative.   Cardiovascular: Negative.   Gastrointestinal: Negative.   Genitourinary: Negative.   Musculoskeletal: Negative.   Skin: Negative.   Neurological: Negative.   Endo/Heme/Allergies: Negative.   Psychiatric/Behavioral: Negative.       Physical Exam:  weight is 225 lb (102.1 kg). Marcia [name] temporal  temperature is 97.1 F (36.2 C) (abnormal). Marcia [name] blood pressure is 151/79 (abnormal) and Marcia [name] pulse is 76. Marcia [name] respiration is 18 and oxygen saturation is 99%.   Wt Readings from Last 3 Encounters:  10/14/19 225 lb (102.1 kg)  07/15/19 220 lb 1.9 oz (99.8 kg)  04/01/19 216 lb 0.6 oz (98 kg)    Physical Exam Vitals signs reviewed.  Constitutional:      Comments: Marcia [name] breast exam shows left breast no masses, edema or erythema.  There is no left axillary adenopathy.  Right breast shows a well-healed lumpectomy at about the 7 o'clock position.  Marcia [name] has some hyperpigmentation of the right breast.  There is some slight firmness of the right breast from radiation.  No distinct masses noted in the right breast.  There is no right axillary adenopathy.  HENT:     Head: Normocephalic and atraumatic.  Eyes:     Pupils: Pupils are equal, round, and reactive to light.  Neck:     Musculoskeletal: Normal range of motion.  Cardiovascular:     Rate and Rhythm: Normal rate and regular rhythm.     Heart sounds: Normal heart sounds.  Pulmonary:     Effort: Pulmonary effort is normal.     Breath sounds: Normal breath sounds.  Abdominal:     General: Bowel sounds are normal.     Palpations: Abdomen is soft.  Musculoskeletal: Normal range of motion.        General: No tenderness or deformity.  Lymphadenopathy:     Cervical: No cervical adenopathy.  Skin:    General: Skin is warm and dry.     Findings: No erythema or rash.  Neurological:     Mental Status: Marcia [name] is alert and oriented to person, place, and time.  Psychiatric:        Behavior: Behavior normal.        Thought Content: Thought content normal.        Judgment: Judgment normal.      Lab Results  Component Value Date   WBC 7.2 10/14/2019   HGB 13.9 10/14/2019   HCT 42.0 10/14/2019   MCV 99.5 10/14/2019   PLT 183 10/14/2019   Lab Results  Component Value Date   FERRITIN 820 (H) 05/17/2018   IRON 272 (H) 05/17/2018   TIBC 295  05/17/2018   UIBC 23 05/17/2018   IRONPCTSAT 92 (H) 05/17/2018   Lab Results  Component Value Date   RETICCTPCT 1.3 03/27/2011   RBC 4.22 10/14/2019   RETICCTABS 56.4 03/27/2011   Lab Results  Component Value Date   KPAFRELGTCHN 38.9 (H) 07/15/2019   LAMBDASER 27.0 (H) 07/15/2019   KAPLAMBRATIO 1.44 07/15/2019   Lab Results  Component Value Date   IGGSERUM 900 07/15/2019   IGA 165 07/15/2019   IGMSERUM 126 07/15/2019   Lab Results  Component Value Date   TOTALPROTELP 6.2 07/15/2019   ALBUMINELP 3.6 07/15/2019   A1GS 0.1 07/15/2019  A2GS 0.7 07/15/2019   BETS 1.1 07/15/2019   BETA2SER 0.4 07/02/2015   GAMS 0.8 07/15/2019   MSPIKE Not Observed 07/15/2019   SPEI Comment 04/22/2019     Chemistry      Component Value Date/Time   NA 140 07/15/2019 0754   NA 141 07/06/2017 0816   NA 141 01/05/2017 0956   K 4.4 07/15/2019 0754   K 3.6 07/06/2017 0816   K 4.3 01/05/2017 0956   CL 103 07/15/2019 0754   CL 101 07/06/2017 0816   CO2 27 07/15/2019 0754   CO2 28 07/06/2017 0816   CO2 23 01/05/2017 0956   BUN 27 (H) 07/15/2019 0754   BUN 28 (H) 07/06/2017 0816   BUN 22.4 01/05/2017 0956   CREATININE 1.48 (H) 07/15/2019 0754   CREATININE 1.6 (H) 07/06/2017 0816   CREATININE 1.4 (H) 01/05/2017 0956      Component Value Date/Time   CALCIUM 8.6 (L) 07/15/2019 0754   CALCIUM 9.1 07/06/2017 0816   CALCIUM 9.3 01/05/2017 0956   ALKPHOS 52 07/15/2019 0754   ALKPHOS 58 07/06/2017 0816   ALKPHOS 52 01/05/2017 0956   AST 16 07/15/2019 0754   AST 14 01/05/2017 0956   ALT 18 07/15/2019 0754   ALT 58 (H) 07/06/2017 0816   ALT 13 01/05/2017 0956   BILITOT 0.5 07/15/2019 0754   BILITOT 0.56 01/05/2017 0956       Impression and Plan: Ms. Pinkstaff is a very pleasant 68 yo caucasian female with past history of kappa light chain myeloma with stem cell transplant. Marcia [name] has now been diagnosed with locally advanced infiltratin ductal carcinoma of the right breast,  ER(-)/PR(-)/Marcia [name]-2(+), Ki67 of 75%.  So far, Marcia [name] is done quite well.  I do not see any evidence of recurrent disease with Marcia [name] breast cancer.  Marcia [name] myeloma is doing quite well.  I really do not see any issues with respect to the myeloma.  I will plan to see Marcia [name] back in 4 months.  I think that 4 month would be perfect..  Marcia [name] still has the Port-A-Cath in.  We will make sure that Marcia [name] gets this flush in 2 months.  [name], MD 11/20/20208:41 AM

## 2019-10-14 NOTE — Telephone Encounter (Signed)
Appointments scheduled calendar printed per 11/20 los

## 2019-10-15 LAB — IGG, IGA, IGM
IgA: 173 mg/dL (ref 87–352)
IgG (Immunoglobin G), Serum: 876 mg/dL (ref 586–1602)
IgM (Immunoglobulin M), Srm: 130 mg/dL (ref 26–217)

## 2019-10-17 LAB — PROTEIN ELECTROPHORESIS, SERUM, WITH REFLEX
A/G Ratio: 1.3 (ref 0.7–1.7)
Albumin ELP: 3.7 g/dL (ref 2.9–4.4)
Alpha-1-Globulin: 0.2 g/dL (ref 0.0–0.4)
Alpha-2-Globulin: 0.7 g/dL (ref 0.4–1.0)
Beta Globulin: 1.1 g/dL (ref 0.7–1.3)
Gamma Globulin: 0.9 g/dL (ref 0.4–1.8)
Globulin, Total: 2.9 g/dL (ref 2.2–3.9)
Total Protein ELP: 6.6 g/dL (ref 6.0–8.5)

## 2019-10-17 LAB — KAPPA/LAMBDA LIGHT CHAINS
Kappa free light chain: 35.3 mg/L — ABNORMAL HIGH (ref 3.3–19.4)
Kappa, lambda light chain ratio: 1.45 (ref 0.26–1.65)
Lambda free light chains: 24.3 mg/L (ref 5.7–26.3)

## 2019-12-15 ENCOUNTER — Encounter: Payer: Self-pay | Admitting: Family

## 2019-12-16 ENCOUNTER — Other Ambulatory Visit: Payer: Self-pay | Admitting: Family

## 2019-12-16 ENCOUNTER — Telehealth: Payer: Self-pay | Admitting: *Deleted

## 2019-12-16 NOTE — Telephone Encounter (Signed)
Called patient and left a message stating,"the letter for jury duty has been faxed to [place], [place]. Fax # is [phone]."

## 2020-01-01 ENCOUNTER — Encounter: Payer: Self-pay | Admitting: Family

## 2020-01-17 ENCOUNTER — Other Ambulatory Visit: Payer: Self-pay | Admitting: *Deleted

## 2020-01-17 DIAGNOSIS — I824Z3 Acute embolism and thrombosis of unspecified deep veins of distal lower extremity, bilateral: Secondary | ICD-10-CM

## 2020-01-17 DIAGNOSIS — I2601 Septic pulmonary embolism with acute cor pulmonale: Secondary | ICD-10-CM

## 2020-01-17 MED ORDER — APIXABAN 2.5 MG PO TABS: 2.5000 mg | ORAL_TABLET | Freq: Two times a day (BID) | ORAL | 10 refills | Status: DC

## 2020-01-20 ENCOUNTER — Other Ambulatory Visit: Payer: Self-pay | Admitting: Hematology & Oncology

## 2020-01-20 ENCOUNTER — Other Ambulatory Visit: Payer: Self-pay

## 2020-01-20 DIAGNOSIS — Z9889 Other specified postprocedural states: Secondary | ICD-10-CM

## 2020-01-20 DIAGNOSIS — Z853 Personal history of malignant neoplasm of breast: Secondary | ICD-10-CM

## 2020-02-13 ENCOUNTER — Inpatient Hospital Stay (HOSPITAL_BASED_OUTPATIENT_CLINIC_OR_DEPARTMENT_OTHER): Payer: 59 | Admitting: Hematology & Oncology

## 2020-02-13 ENCOUNTER — Inpatient Hospital Stay: Payer: 59

## 2020-02-13 ENCOUNTER — Inpatient Hospital Stay: Payer: 59 | Attending: Hematology & Oncology

## 2020-02-13 ENCOUNTER — Encounter: Payer: Self-pay | Admitting: Hematology & Oncology

## 2020-02-13 ENCOUNTER — Other Ambulatory Visit: Payer: Self-pay

## 2020-02-13 VITALS — Wt 228.0 lb

## 2020-02-13 VITALS — BP 160/76 | HR 71 | Temp 97.3°F | Resp 18 | Wt 228.0 lb

## 2020-02-13 DIAGNOSIS — C9001 Multiple myeloma in remission: Secondary | ICD-10-CM | POA: Diagnosis not present

## 2020-02-13 DIAGNOSIS — Z171 Estrogen receptor negative status [ER-]: Secondary | ICD-10-CM | POA: Diagnosis not present

## 2020-02-13 DIAGNOSIS — Z853 Personal history of malignant neoplasm of breast: Secondary | ICD-10-CM | POA: Diagnosis not present

## 2020-02-13 DIAGNOSIS — Z79899 Other long term (current) drug therapy: Secondary | ICD-10-CM | POA: Diagnosis not present

## 2020-02-13 DIAGNOSIS — Z7901 Long term (current) use of anticoagulants: Secondary | ICD-10-CM | POA: Diagnosis not present

## 2020-02-13 DIAGNOSIS — Z9221 Personal history of antineoplastic chemotherapy: Secondary | ICD-10-CM | POA: Insufficient documentation

## 2020-02-13 DIAGNOSIS — C50911 Malignant neoplasm of unspecified site of right female breast: Secondary | ICD-10-CM | POA: Diagnosis not present

## 2020-02-13 DIAGNOSIS — Z86718 Personal history of other venous thrombosis and embolism: Secondary | ICD-10-CM | POA: Insufficient documentation

## 2020-02-13 DIAGNOSIS — Z923 Personal history of irradiation: Secondary | ICD-10-CM | POA: Diagnosis not present

## 2020-02-13 DIAGNOSIS — Z86711 Personal history of pulmonary embolism: Secondary | ICD-10-CM | POA: Diagnosis not present

## 2020-02-13 DIAGNOSIS — Z9484 Stem cells transplant status: Secondary | ICD-10-CM | POA: Diagnosis not present

## 2020-02-13 LAB — CBC WITH DIFFERENTIAL (CANCER CENTER ONLY)
Abs Immature Granulocytes: 0.04 10*3/uL (ref 0.00–0.07)
Basophils Absolute: 0 10*3/uL (ref 0.0–0.1)
Basophils Relative: 0 %
Eosinophils Absolute: 0.2 10*3/uL (ref 0.0–0.5)
Eosinophils Relative: 3 %
HCT: 41.4 % (ref 36.0–46.0)
Hemoglobin: 13.7 g/dL (ref 12.0–15.0)
Immature Granulocytes: 1 %
Lymphocytes Relative: 50 %
Lymphs Abs: 3.5 10*3/uL (ref 0.7–4.0)
MCH: 32.7 pg (ref 26.0–34.0)
MCHC: 33.1 g/dL (ref 30.0–36.0)
MCV: 98.8 fL (ref 80.0–100.0)
Monocytes Absolute: 0.4 10*3/uL (ref 0.1–1.0)
Monocytes Relative: 6 %
Neutro Abs: 2.8 10*3/uL (ref 1.7–7.7)
Neutrophils Relative %: 40 %
Platelet Count: 187 10*3/uL (ref 150–400)
RBC: 4.19 MIL/uL (ref 3.87–5.11)
RDW: 13.2 % (ref 11.5–15.5)
WBC Count: 6.9 10*3/uL (ref 4.0–10.5)
nRBC: 0 % (ref 0.0–0.2)

## 2020-02-13 LAB — CMP (CANCER CENTER ONLY)
ALT: 16 U/L (ref 0–44)
AST: 15 U/L (ref 15–41)
Albumin: 3.9 g/dL (ref 3.5–5.0)
Alkaline Phosphatase: 51 U/L (ref 38–126)
Anion gap: 9 (ref 5–15)
BUN: 27 mg/dL — ABNORMAL HIGH (ref 8–23)
CO2: 27 mmol/L (ref 22–32)
Calcium: 8.8 mg/dL — ABNORMAL LOW (ref 8.9–10.3)
Chloride: 105 mmol/L (ref 98–111)
Creatinine: 1.29 mg/dL — ABNORMAL HIGH (ref 0.44–1.00)
GFR, Est AFR Am: 49 mL/min — ABNORMAL LOW (ref >60)
GFR, Estimated: 42 mL/min — ABNORMAL LOW (ref >60)
Glucose, Bld: 97 mg/dL (ref 70–99)
Potassium: 3.7 mmol/L (ref 3.5–5.1)
Sodium: 141 mmol/L (ref 135–145)
Total Bilirubin: 0.5 mg/dL (ref 0.3–1.2)
Total Protein: 6.5 g/dL (ref 6.5–8.1)

## 2020-02-13 MED ORDER — ANTICOAGULANT SODIUM CITRATE 4% (200MG/5ML) IV SOLN
5.0000 mL | Freq: Once | Status: AC
  Administered 2020-02-13: 5 mL
  Filled 2020-02-13: qty 5

## 2020-02-13 MED ORDER — SODIUM CHLORIDE 0.9% FLUSH
10.0000 mL | INTRAVENOUS | Status: DC | PRN
  Administered 2020-02-13: 10 mL
  Filled 2020-02-13: qty 10

## 2020-02-13 NOTE — Patient Instructions (Signed)

## 2020-02-13 NOTE — Progress Notes (Signed)
Hematology and Oncology Follow Up Visit  Marcia [name] [id number] [birth year] 69 y.o. 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  So far, she is doing quite well.  We saw her 4 months ago.  The big change with her is that she now has a new [place].  This is very cute.  I saw a lot of pictures of her.  Marcia [name] has had her first coronavirus vaccine.  She had no problems with this.  She has had no problems with fever.  She has had no issues with respect to myeloma.  She is on the Eliquis.  She is on 2.5 mg twice daily.  She is doing well with this.  She has had no bony pain.  She has had no change in bowel or bladder habits.  She has had no headache.  Her weight is still up quite a bit.  Hopefully she will lose a little bit of weight.  Overall, her performance  status is ECOG 1.    Medications:  Allergies as of 02/13/2020      Reactions   Bactrim [sulfamethoxazole-trimethoprim] Other (See Comments)   ? Renal failure.   Heparin Other (See Comments)   HIT   Zofran [ondansetron] Shortness Of Breath   "felt like throat was closing"   Clarithromycin Diarrhea, Nausea And Vomiting   Latex Rash   Macrodantin [nitrofurantoin] Rash      Medication List       Accurate as of February 13, 2020  8:53 AM. If you have any questions, ask your nurse or doctor.         acetaminophen 325 MG tablet Commonly known as: TYLENOL Take 650 mg by mouth daily as needed for moderate pain or headache.   amoxicillin 500 MG capsule Commonly known as: AMOXIL Prior to dentist   apixaban 2.5 MG Tabs tablet Commonly known as: ELIQUIS Take 1 tablet (2.5 mg total) by mouth 2 (two) times daily.   Denta 5000 Plus 1.1 % Crea dental cream Generic drug: sodium fluoride USE AS DIRECTED ON PACKAGE TO BRUSH TEETH TWICE DAILY SPIT OUT AFTER BRUSHING   DO NOT RINSE   doxylamine (Sleep) 25 MG tablet Commonly known as: UNISOM Take 25 mg by mouth at bedtime as needed for sleep.   feeding supplement (ENSURE ENLIVE) Liqd Take 237 mLs by mouth 3 (three) times daily between meals. What changed: when to take this   folic acid 1 MG tablet Commonly known as: FOLVITE TAKE 2 TABLETS (2 MG TOTAL) BY MOUTH DAILY.   lidocaine-prilocaine cream Commonly known as: EMLA Apply 1 application topically as needed (local anesthetic).   nystatin 100000 UNIT/ML suspension Commonly known as: MYCOSTATIN   vitamin C 250 MG tablet Commonly known as: ASCORBIC ACID Take 250 mg by mouth 2 (two) times daily.       Allergies:  Allergies  Allergen Reactions  . Bactrim [Sulfamethoxazole-Trimethoprim] Other (See Comments)    ? Renal failure.  . Heparin Other (See  Comments)    HIT  . Zofran [Ondansetron] Shortness Of Breath    "felt like throat was closing"  . Clarithromycin Diarrhea and Nausea And Vomiting  . Latex Rash  . Macrodantin [Nitrofurantoin] Rash    Past Medical History, Surgical history, Social history, and Family History were reviewed and updated.  Review of Systems: Review of Systems  Constitutional: Negative.   HENT: Negative.   Eyes: Negative.   Respiratory: Negative.   Cardiovascular: Negative.   Gastrointestinal: Negative.   Genitourinary: Negative.   Musculoskeletal: Negative.   Skin: Negative.   Neurological: Negative.   Endo/Heme/Allergies: Negative.    Psychiatric/Behavioral: Negative.       Physical Exam:  weight is 228 lb (103.4 kg).   Wt Readings from Last 3 Encounters:  02/13/20 228 lb (103.4 kg)  02/13/20 228 lb (103.4 kg)  10/14/19 225 lb (102.1 kg)    Physical Exam Vitals reviewed.  Constitutional:      Comments: Her breast exam shows left breast no masses, edema or erythema.  There is no left axillary adenopathy.  Right breast shows a well-healed lumpectomy at about the 7 o'clock position.  She has some hyperpigmentation of the right breast.  There is some slight firmness of the right breast from radiation.  No distinct masses noted in the right breast.  There is no right axillary adenopathy.  HENT:     Head: Normocephalic and atraumatic.  Eyes:     Pupils: Pupils are equal, round, and reactive to light.  Cardiovascular:     Rate and Rhythm: Normal rate and regular rhythm.     Heart sounds: Normal heart sounds.  Pulmonary:     Effort: Pulmonary effort is normal.     Breath sounds: Normal breath sounds.  Abdominal:     General: Bowel sounds are normal.     Palpations: Abdomen is soft.  Musculoskeletal:        General: No tenderness or deformity. Normal range of motion.     Cervical back: Normal range of motion.  Lymphadenopathy:     Cervical: No cervical adenopathy.  Skin:    General: Skin is warm and dry.     Findings: No erythema or rash.  Neurological:     Mental Status: She is alert and oriented to person, place, and time.  Psychiatric:        Behavior: Behavior normal.        Thought Content: Thought content normal.        Judgment: Judgment normal.      Lab Results  Component Value Date   WBC 6.9 02/13/2020   HGB 13.7 02/13/2020   HCT 41.4 02/13/2020   MCV 98.8 02/13/2020   PLT 187 02/13/2020   Lab Results  Component Value Date   FERRITIN 820 (H) 05/17/2018   IRON 272 (H) 05/17/2018   TIBC 295 05/17/2018   UIBC 23 05/17/2018   IRONPCTSAT 92 (H) 05/17/2018   Lab Results  Component Value  Date   RETICCTPCT 1.3 03/27/2011   RBC 4.19 02/13/2020   RETICCTABS 56.4 03/27/2011   Lab Results  Component Value Date   KPAFRELGTCHN 35.3 (H) 10/14/2019   LAMBDASER 24.3 10/14/2019   KAPLAMBRATIO 1.45 10/14/2019   Lab Results  Component Value Date   IGGSERUM 876 10/14/2019   IGA 173 10/14/2019   IGMSERUM 130 10/14/2019   Lab Results  Component Value Date   TOTALPROTELP 6.6 10/14/2019   ALBUMINELP 3.7 10/14/2019   A1GS 0.2 10/14/2019   A2GS 0.7  10/14/2019   BETS 1.1 10/14/2019   BETA2SER 0.4 07/02/2015   GAMS 0.9 10/14/2019   MSPIKE Not Observed 10/14/2019   SPEI Comment 04/22/2019     Chemistry      Component Value Date/Time   NA 142 10/14/2019 0820   NA 141 07/06/2017 0816   NA 141 01/05/2017 0956   K 4.4 10/14/2019 0820   K 3.6 07/06/2017 0816   K 4.3 01/05/2017 0956   CL 104 10/14/2019 0820   CL 101 07/06/2017 0816   CO2 29 10/14/2019 0820   CO2 28 07/06/2017 0816   CO2 23 01/05/2017 0956   BUN 28 (H) 10/14/2019 0820   BUN 28 (H) 07/06/2017 0816   BUN 22.4 01/05/2017 0956   CREATININE 1.47 (H) 10/14/2019 0820   CREATININE 1.6 (H) 07/06/2017 0816   CREATININE 1.4 (H) 01/05/2017 0956      Component Value Date/Time   CALCIUM 9.1 10/14/2019 0820   CALCIUM 9.1 07/06/2017 0816   CALCIUM 9.3 01/05/2017 0956   ALKPHOS 48 10/14/2019 0820   ALKPHOS 58 07/06/2017 0816   ALKPHOS 52 01/05/2017 0956   AST 16 10/14/2019 0820   AST 14 01/05/2017 0956   ALT 15 10/14/2019 0820   ALT 58 (H) 07/06/2017 0816   ALT 13 01/05/2017 0956   BILITOT 0.5 10/14/2019 0820   BILITOT 0.56 01/05/2017 0956       Impression and Plan: Ms. Cansler is a very pleasant 69 yo caucasian female with past history of kappa light chain myeloma with stem cell transplant. 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  So far, she is done quite well.  I do not see any evidence of recurrent disease with  her breast cancer.  Her myeloma is doing quite well.  I really do not see any issues with respect to the myeloma.  I will plan to see her back in 4 months.  I think that 4 month would be perfect..  She still has the Port-A-Cath in.  We will make sure that she gets this flush in 2 months.  [name], MD 3/22/20218:53 AM

## 2020-02-14 LAB — KAPPA/LAMBDA LIGHT CHAINS
Kappa free light chain: 34.3 mg/L — ABNORMAL HIGH (ref 3.3–19.4)
Kappa, lambda light chain ratio: 1.36 (ref 0.26–1.65)
Lambda free light chains: 25.2 mg/L (ref 5.7–26.3)

## 2020-02-14 LAB — IGG, IGA, IGM
IgA: 163 mg/dL (ref 87–352)
IgG (Immunoglobin G), Serum: 855 mg/dL (ref 586–1602)
IgM (Immunoglobulin M), Srm: 116 mg/dL (ref 26–217)

## 2020-02-15 LAB — PROTEIN ELECTROPHORESIS, SERUM, WITH REFLEX
A/G Ratio: 1.3 (ref 0.7–1.7)
Albumin ELP: 3.5 g/dL (ref 2.9–4.4)
Alpha-1-Globulin: 0.1 g/dL (ref 0.0–0.4)
Alpha-2-Globulin: 0.7 g/dL (ref 0.4–1.0)
Beta Globulin: 1.1 g/dL (ref 0.7–1.3)
Gamma Globulin: 0.8 g/dL (ref 0.4–1.8)
Globulin, Total: 2.7 g/dL (ref 2.2–3.9)
Total Protein ELP: 6.2 g/dL (ref 6.0–8.5)

## 2020-02-24 ENCOUNTER — Ambulatory Visit
Admission: RE | Admit: 2020-02-24 | Discharge: 2020-02-24 | Disposition: A | Discharge status: Home / Self Care | Payer: 59 | Source: Ambulatory Visit | Attending: Hematology & Oncology | Admitting: Hematology & Oncology

## 2020-02-24 ENCOUNTER — Other Ambulatory Visit: Payer: Self-pay

## 2020-02-24 DIAGNOSIS — Z9889 Other specified postprocedural states: Secondary | ICD-10-CM

## 2020-02-24 DIAGNOSIS — Z853 Personal history of malignant neoplasm of breast: Secondary | ICD-10-CM

## 2020-02-24 HISTORY — DX: Personal history of antineoplastic chemotherapy: Z92.21

## 2020-02-24 HISTORY — DX: Personal history of irradiation: Z92.3

## 2020-04-13 ENCOUNTER — Inpatient Hospital Stay: Payer: 59

## 2020-06-11 ENCOUNTER — Other Ambulatory Visit: Payer: Self-pay | Admitting: Hematology & Oncology

## 2020-06-11 DIAGNOSIS — D649 Anemia, unspecified: Secondary | ICD-10-CM

## 2020-06-13 ENCOUNTER — Inpatient Hospital Stay: Payer: 59

## 2020-06-13 ENCOUNTER — Inpatient Hospital Stay: Payer: 59 | Admitting: Hematology & Oncology

## 2020-06-18 ENCOUNTER — Telehealth: Payer: Self-pay | Admitting: Family

## 2020-06-18 ENCOUNTER — Encounter: Payer: Self-pay | Admitting: Family

## 2020-06-18 ENCOUNTER — Inpatient Hospital Stay: Payer: 59

## 2020-06-18 ENCOUNTER — Inpatient Hospital Stay (HOSPITAL_BASED_OUTPATIENT_CLINIC_OR_DEPARTMENT_OTHER): Payer: 59 | Admitting: Family

## 2020-06-18 ENCOUNTER — Inpatient Hospital Stay: Payer: 59 | Attending: Hematology & Oncology

## 2020-06-18 ENCOUNTER — Other Ambulatory Visit: Payer: Self-pay

## 2020-06-18 VITALS — BP 176/74 | HR 72 | Temp 98.4°F | Resp 18 | Ht 59.0 in | Wt 229.0 lb

## 2020-06-18 VITALS — BP 176/74 | HR 72 | Temp 98.4°F | Resp 18 | Wt 229.0 lb

## 2020-06-18 DIAGNOSIS — Z853 Personal history of malignant neoplasm of breast: Secondary | ICD-10-CM | POA: Insufficient documentation

## 2020-06-18 DIAGNOSIS — C9001 Multiple myeloma in remission: Secondary | ICD-10-CM | POA: Diagnosis not present

## 2020-06-18 DIAGNOSIS — Z9221 Personal history of antineoplastic chemotherapy: Secondary | ICD-10-CM | POA: Diagnosis not present

## 2020-06-18 DIAGNOSIS — Z7901 Long term (current) use of anticoagulants: Secondary | ICD-10-CM | POA: Diagnosis not present

## 2020-06-18 DIAGNOSIS — C50911 Malignant neoplasm of unspecified site of right female breast: Secondary | ICD-10-CM | POA: Diagnosis not present

## 2020-06-18 DIAGNOSIS — I2601 Septic pulmonary embolism with acute cor pulmonale: Secondary | ICD-10-CM | POA: Diagnosis not present

## 2020-06-18 DIAGNOSIS — Z8579 Personal history of other malignant neoplasms of lymphoid, hematopoietic and related tissues: Secondary | ICD-10-CM | POA: Diagnosis present

## 2020-06-18 DIAGNOSIS — Z923 Personal history of irradiation: Secondary | ICD-10-CM | POA: Diagnosis not present

## 2020-06-18 DIAGNOSIS — Z86711 Personal history of pulmonary embolism: Secondary | ICD-10-CM | POA: Diagnosis not present

## 2020-06-18 DIAGNOSIS — Z9484 Stem cells transplant status: Secondary | ICD-10-CM | POA: Diagnosis not present

## 2020-06-18 DIAGNOSIS — I824Z3 Acute embolism and thrombosis of unspecified deep veins of distal lower extremity, bilateral: Secondary | ICD-10-CM | POA: Diagnosis not present

## 2020-06-18 LAB — CBC WITH DIFFERENTIAL (CANCER CENTER ONLY)
Abs Immature Granulocytes: 0.01 10*3/uL (ref 0.00–0.07)
Basophils Absolute: 0 10*3/uL (ref 0.0–0.1)
Basophils Relative: 0 %
Eosinophils Absolute: 0.1 10*3/uL (ref 0.0–0.5)
Eosinophils Relative: 2 %
HCT: 42.8 % (ref 36.0–46.0)
Hemoglobin: 14 g/dL (ref 12.0–15.0)
Immature Granulocytes: 0 %
Lymphocytes Relative: 47 %
Lymphs Abs: 3.7 10*3/uL (ref 0.7–4.0)
MCH: 32 pg (ref 26.0–34.0)
MCHC: 32.7 g/dL (ref 30.0–36.0)
MCV: 97.9 fL (ref 80.0–100.0)
Monocytes Absolute: 0.4 10*3/uL (ref 0.1–1.0)
Monocytes Relative: 5 %
Neutro Abs: 3.6 10*3/uL (ref 1.7–7.7)
Neutrophils Relative %: 46 %
Platelet Count: 194 10*3/uL (ref 150–400)
RBC: 4.37 MIL/uL (ref 3.87–5.11)
RDW: 13.2 % (ref 11.5–15.5)
WBC Count: 7.9 10*3/uL (ref 4.0–10.5)
nRBC: 0 % (ref 0.0–0.2)

## 2020-06-18 LAB — CMP (CANCER CENTER ONLY)
ALT: 15 U/L (ref 0–44)
AST: 14 U/L — ABNORMAL LOW (ref 15–41)
Albumin: 4.2 g/dL (ref 3.5–5.0)
Alkaline Phosphatase: 50 U/L (ref 38–126)
Anion gap: 9 (ref 5–15)
BUN: 25 mg/dL — ABNORMAL HIGH (ref 8–23)
CO2: 27 mmol/L (ref 22–32)
Calcium: 9.1 mg/dL (ref 8.9–10.3)
Chloride: 104 mmol/L (ref 98–111)
Creatinine: 1.33 mg/dL — ABNORMAL HIGH (ref 0.44–1.00)
GFR, Est AFR Am: 46 mL/min — ABNORMAL LOW (ref >60)
GFR, Estimated: 40 mL/min — ABNORMAL LOW (ref >60)
Glucose, Bld: 97 mg/dL (ref 70–99)
Potassium: 3.7 mmol/L (ref 3.5–5.1)
Sodium: 140 mmol/L (ref 135–145)
Total Bilirubin: 0.6 mg/dL (ref 0.3–1.2)
Total Protein: 6.7 g/dL (ref 6.5–8.1)

## 2020-06-18 MED ORDER — ANTICOAGULANT SODIUM CITRATE 4% (200MG/5ML) IV SOLN
5.0000 mL | Freq: Once | Status: AC
  Administered 2020-06-18: 5 mL
  Filled 2020-06-18: qty 5

## 2020-06-18 MED ORDER — SODIUM CHLORIDE 0.9% FLUSH
10.0000 mL | INTRAVENOUS | Status: DC | PRN
  Administered 2020-06-18: 10 mL
  Filled 2020-06-18: qty 10

## 2020-06-18 NOTE — Patient Instructions (Signed)

## 2020-06-18 NOTE — Progress Notes (Signed)
Hematology and Oncology Follow Up Visit  Marcia [name] [id number] [birth year] 69 y.o. 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 She is doing well and has no complaints at this time.  She has maintained a good appetite and is staying well hydrated. She plans to start [employer].  She is doing regular breast exams at home and reports no new findings.  No adenopathy.  She has not had any issue with infection. No fever, chills, n/v, cough, rash, dizziness, SOB, chest pain, palpitations, abdominal pain or changes in bowel or bladder habits.  No swelling, tenderness, numbness or tingling in her extremities.  No falls or syncope.  No episodes of bleeding. No bruising or petechiae.   ECOG Performance Status: 0 - Asymptomatic  Medications:  Allergies as of 06/18/2020      Reactions   Bactrim [sulfamethoxazole-trimethoprim] Other (See Comments)   ? Renal failure.   Heparin Other (See Comments)   HIT   Zofran [ondansetron] Shortness Of Breath   "felt like throat was closing"   Clarithromycin Diarrhea, Nausea And Vomiting   Latex Rash   Macrodantin [nitrofurantoin] Rash      Medication List       Accurate as of June 18, 2020 12:55 PM. If you have any questions, ask your nurse or doctor.        STOP taking these medications   doxylamine (Sleep) 25 MG tablet Commonly  known as: UNISOM Stopped by: Laverna Peace, NP   nystatin 100000 UNIT/ML suspension Commonly known as: MYCOSTATIN Stopped by: Laverna Peace, NP     TAKE these medications   acetaminophen 325 MG tablet Commonly known as: TYLENOL Take 650 mg by mouth daily as needed for moderate pain or headache.   amoxicillin 500 MG capsule Commonly known as: AMOXIL Prior to dentist   apixaban 2.5 MG Tabs tablet Commonly known as: ELIQUIS Take 1 tablet (2.5 mg total) by mouth 2 (two) times daily.   Denta 5000 Plus 1.1 % Crea dental cream Generic drug: sodium fluoride USE AS DIRECTED ON PACKAGE TO BRUSH TEETH TWICE DAILY SPIT OUT AFTER BRUSHING   DO NOT RINSE   feeding supplement (ENSURE ENLIVE) Liqd Take 237 mLs by mouth 3 (three) times daily between meals. What changed: when to take this   folic acid 1 MG tablet Commonly known as: FOLVITE TAKE 2 TABLETS (2 MG TOTAL) BY MOUTH DAILY.   lidocaine-prilocaine cream Commonly known as: EMLA Apply 1 application topically as needed (local anesthetic).   vitamin C 250 MG tablet Commonly known as: ASCORBIC ACID Take 250 mg by mouth 2 (two) times daily.       Allergies:  Allergies  Allergen Reactions   Bactrim [Sulfamethoxazole-Trimethoprim] Other (See Comments)    ? Renal failure.   Heparin Other (See Comments)    HIT   Zofran [Ondansetron] Shortness Of Breath    "felt like throat was closing"   Clarithromycin Diarrhea and Nausea And Vomiting   Latex  Rash   Macrodantin [Nitrofurantoin] Rash    Past Medical History, Surgical history, Social history, and Family History were reviewed and updated.  Review of Systems: All other 10 point review of systems is negative.   Physical Exam:  height is _0  (1.499 m) and weight is 229 lb (103.9 kg) (abnormal). Her oral temperature is 98.4 F (36.9 C). Her blood pressure is 176/74 (abnormal) and her pulse is 72. Her respiration is 18 and oxygen saturation is 100%.   Wt Readings  from Last 3 Encounters:  06/18/20 (!) 229 lb (103.9 kg)  06/18/20 (!) 229 lb (103.9 kg)  02/13/20 228 lb (103.4 kg)    Ocular: Sclerae unicteric, pupils equal, round and reactive to light Ear-nose-throat: Oropharynx clear, dentition fair Lymphatic: No cervical or supraclavicular adenopathy Lungs no rales or rhonchi, good excursion bilaterally Heart regular rate and rhythm, no murmur appreciated Abd soft, nontender, positive bowel sounds, no liver or spleen tip palpated on exam, no fluid wave  MSK no focal spinal tenderness, no joint edema Neuro: non-focal, well-oriented, appropriate affect Breasts: Deferred this visit. Patient seen in infusion.  Lab Results  Component Value Date   WBC 7.9 06/18/2020   HGB 14.0 06/18/2020   HCT 42.8 06/18/2020   MCV 97.9 06/18/2020   PLT 194 06/18/2020   Lab Results  Component Value Date   FERRITIN 820 (H) 05/17/2018   IRON 272 (H) 05/17/2018   TIBC 295 05/17/2018   UIBC 23 05/17/2018   IRONPCTSAT 92 (H) 05/17/2018   Lab Results  Component Value Date   RETICCTPCT 1.3 03/27/2011   RBC 4.37 06/18/2020   RETICCTABS 56.4 03/27/2011   Lab Results  Component Value Date   KPAFRELGTCHN 34.3 (H) 02/13/2020   LAMBDASER 25.2 02/13/2020   KAPLAMBRATIO 1.36 02/13/2020   Lab Results  Component Value Date   IGGSERUM 855 02/13/2020   IGA 163 02/13/2020   IGMSERUM 116 02/13/2020   Lab Results  Component Value Date   TOTALPROTELP 6.2 02/13/2020   ALBUMINELP 3.5 02/13/2020   A1GS 0.1 02/13/2020   A2GS 0.7 02/13/2020   BETS 1.1 02/13/2020   BETA2SER 0.4 07/02/2015   GAMS 0.8 02/13/2020   MSPIKE Not Observed 02/13/2020   SPEI Comment 04/22/2019     Chemistry      Component Value Date/Time   NA 140 06/18/2020 1150   NA 141 07/06/2017 0816   NA 141 01/05/2017 0956   K 3.7 06/18/2020 1150   K 3.6 07/06/2017 0816   K 4.3 01/05/2017 0956   CL 104 06/18/2020 1150   CL 101 07/06/2017 0816   CO2 27 06/18/2020 1150   CO2 28 07/06/2017 0816     CO2 23 01/05/2017 0956   BUN 25 (H) 06/18/2020 1150   BUN 28 (H) 07/06/2017 0816   BUN 22.4 01/05/2017 0956   CREATININE 1.33 (H) 06/18/2020 1150   CREATININE 1.6 (H) 07/06/2017 0816   CREATININE 1.4 (H) 01/05/2017 0956      Component Value Date/Time   CALCIUM 9.1 06/18/2020 1150   CALCIUM 9.1 07/06/2017 0816   CALCIUM 9.3 01/05/2017 0956   ALKPHOS 50 06/18/2020 1150   ALKPHOS 58 07/06/2017 0816   ALKPHOS 52 01/05/2017 0956   AST 14 (L) 06/18/2020 1150   AST 14 01/05/2017 0956   ALT 15 06/18/2020 1150   ALT 58 (H) 07/06/2017 0816   ALT 13 01/05/2017 0956   BILITOT 0.6 06/18/2020 1150   BILITOT 0.56 01/05/2017 0956       Impression  and Plan: Marcia [name] is a very pleasant 69 yo caucasian female with past history of kappa light chain myeloma with stem cell transplant. Protein studies are pending. No m-spike noted in March, kappa light chains were 3.43 mg/dL.  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 She completed neoadjuvant chemo with 4 cycles of carbo/Taxol as well as radiation therapy.  So far, she continues to do well and there has been no evidence of recurrence.  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 She is on lifelong maintenance anticoagulation with Eliquis.  We will plan to see her again in another 4 months. Port flush every 6-8 weeks.  She can contact our office with any questions or concerns.   Laverna Peace, NP 7/26/202112:55 PM

## 2020-06-18 NOTE — Telephone Encounter (Signed)
Appointments scheduled patient declined calendar due to My Chart / 7/26 los

## 2020-06-19 LAB — KAPPA/LAMBDA LIGHT CHAINS
Kappa free light chain: 42.6 mg/L — ABNORMAL HIGH (ref 3.3–19.4)
Kappa, lambda light chain ratio: 1.87 — ABNORMAL HIGH (ref 0.26–1.65)
Lambda free light chains: 22.8 mg/L (ref 5.7–26.3)

## 2020-06-19 LAB — IGG, IGA, IGM
IgA: 175 mg/dL (ref 64–422)
IgG (Immunoglobin G), Serum: 859 mg/dL (ref 586–1602)
IgM (Immunoglobulin M), Srm: 126 mg/dL (ref 26–217)

## 2020-06-20 LAB — PROTEIN ELECTROPHORESIS, SERUM, WITH REFLEX
A/G Ratio: 1.1 (ref 0.7–1.7)
Albumin ELP: 3.4 g/dL (ref 2.9–4.4)
Alpha-1-Globulin: 0.2 g/dL (ref 0.0–0.4)
Alpha-2-Globulin: 0.7 g/dL (ref 0.4–1.0)
Beta Globulin: 1.1 g/dL (ref 0.7–1.3)
Gamma Globulin: 0.9 g/dL (ref 0.4–1.8)
Globulin, Total: 3 g/dL (ref 2.2–3.9)
Total Protein ELP: 6.4 g/dL (ref 6.0–8.5)

## 2020-08-13 ENCOUNTER — Other Ambulatory Visit: Payer: Self-pay

## 2020-08-13 ENCOUNTER — Inpatient Hospital Stay: Payer: 59 | Attending: Hematology & Oncology

## 2020-08-13 VITALS — BP 158/83 | HR 78 | Resp 17

## 2020-08-13 DIAGNOSIS — Z452 Encounter for adjustment and management of vascular access device: Secondary | ICD-10-CM | POA: Diagnosis not present

## 2020-08-13 DIAGNOSIS — Z8579 Personal history of other malignant neoplasms of lymphoid, hematopoietic and related tissues: Secondary | ICD-10-CM | POA: Diagnosis present

## 2020-08-13 DIAGNOSIS — C9001 Multiple myeloma in remission: Secondary | ICD-10-CM

## 2020-08-13 MED ORDER — SODIUM CHLORIDE 0.9% FLUSH
10.0000 mL | INTRAVENOUS | Status: DC | PRN
  Administered 2020-08-13: 10 mL
  Filled 2020-08-13: qty 10

## 2020-08-13 MED ORDER — ANTICOAGULANT SODIUM CITRATE 4% (200MG/5ML) IV SOLN
5.0000 mL | Freq: Once | Status: AC
  Administered 2020-08-13: 5 mL
  Filled 2020-08-13: qty 5

## 2020-08-13 NOTE — Patient Instructions (Signed)
Implanted Port Insertion, Care After °This sheet gives you information about how to care for yourself after your procedure. Your health care provider may also give you more specific instructions. If you have problems or questions, contact your health care provider. °What can I expect after the procedure? °After the procedure, it is common to have: °· Discomfort at the port insertion site. °· Bruising on the skin over the port. This should improve over 3-4 days. °Follow these instructions at home: °Port care °· After your port is placed, you will get a manufacturer's information card. The card has information about your port. Keep this card with you at all times. °· Take care of the port as told by your health care provider. Ask your health care provider if you or a family member can get training for taking care of the port at home. A home health care nurse may also take care of the port. °· Make sure to remember what type of port you have. °Incision care ° °  ° °· Follow instructions from your health care provider about how to take care of your port insertion site. Make sure you: °? Wash your hands with soap and water before and after you change your bandage (dressing). If soap and water are not available, use hand sanitizer. °? Change your dressing as told by your health care provider. °? Leave stitches (sutures), skin glue, or adhesive strips in place. These skin closures may need to stay in place for 2 weeks or longer. If adhesive strip edges start to loosen and curl up, you may trim the loose edges. Do not remove adhesive strips completely unless your health care provider tells you to do that. °· Check your port insertion site every day for signs of infection. Check for: °? Redness, swelling, or pain. °? Fluid or blood. °? Warmth. °? Pus or a bad smell. °Activity °· Return to your normal activities as told by your health care provider. Ask your health care provider what activities are safe for you. °· Do not  lift anything that is heavier than 10 lb (4.5 kg), or the limit that you are told, until your health care provider says that it is safe. °General instructions °· Take over-the-counter and prescription medicines only as told by your health care provider. °· Do not take baths, swim, or use a hot tub until your health care provider approves. Ask your health care provider if you may take showers. You may only be allowed to take sponge baths. °· Do not drive for 24 hours if you were given a sedative during your procedure. °· Wear a medical alert bracelet in case of an emergency. This will tell any health care providers that you have a port. °· Keep all follow-up visits as told by your health care provider. This is important. °Contact a health care provider if: °· You cannot flush your port with saline as directed, or you cannot draw blood from the port. °· You have a fever or chills. °· You have redness, swelling, or pain around your port insertion site. °· You have fluid or blood coming from your port insertion site. °· Your port insertion site feels warm to the touch. °· You have pus or a bad smell coming from the port insertion site. °Get help right away if: °· You have chest pain or shortness of breath. °· You have bleeding from your port that you cannot control. °Summary °· Take care of the port as told by your health   care provider. Keep the manufacturer's information card with you at all times. °· Change your dressing as told by your health care provider. °· Contact a health care provider if you have a fever or chills or if you have redness, swelling, or pain around your port insertion site. °· Keep all follow-up visits as told by your health care provider. °This information is not intended to replace advice given to you by your health care provider. Make sure you discuss any questions you have with your health care provider. °Document Revised: 06/08/2018 Document Reviewed: 06/08/2018 °Elsevier Patient Education ©  2020 Elsevier Inc. ° °

## 2020-10-08 ENCOUNTER — Other Ambulatory Visit: Payer: Self-pay

## 2020-10-08 ENCOUNTER — Encounter: Payer: Self-pay | Admitting: Family

## 2020-10-08 ENCOUNTER — Inpatient Hospital Stay: Payer: 59 | Attending: Hematology & Oncology

## 2020-10-08 ENCOUNTER — Inpatient Hospital Stay (HOSPITAL_BASED_OUTPATIENT_CLINIC_OR_DEPARTMENT_OTHER): Payer: 59 | Admitting: Family

## 2020-10-08 ENCOUNTER — Telehealth: Payer: Self-pay

## 2020-10-08 ENCOUNTER — Inpatient Hospital Stay: Payer: 59

## 2020-10-08 VITALS — BP 186/87 | HR 88 | Resp 17 | Ht 59.0 in | Wt 234.0 lb

## 2020-10-08 DIAGNOSIS — Z9221 Personal history of antineoplastic chemotherapy: Secondary | ICD-10-CM | POA: Insufficient documentation

## 2020-10-08 DIAGNOSIS — Z79899 Other long term (current) drug therapy: Secondary | ICD-10-CM | POA: Diagnosis not present

## 2020-10-08 DIAGNOSIS — Z23 Encounter for immunization: Secondary | ICD-10-CM | POA: Insufficient documentation

## 2020-10-08 DIAGNOSIS — I2601 Septic pulmonary embolism with acute cor pulmonale: Secondary | ICD-10-CM

## 2020-10-08 DIAGNOSIS — Z86711 Personal history of pulmonary embolism: Secondary | ICD-10-CM | POA: Diagnosis not present

## 2020-10-08 DIAGNOSIS — C9001 Multiple myeloma in remission: Secondary | ICD-10-CM | POA: Diagnosis not present

## 2020-10-08 DIAGNOSIS — C50911 Malignant neoplasm of unspecified site of right female breast: Secondary | ICD-10-CM | POA: Diagnosis not present

## 2020-10-08 DIAGNOSIS — Z9484 Stem cells transplant status: Secondary | ICD-10-CM | POA: Diagnosis not present

## 2020-10-08 DIAGNOSIS — C9000 Multiple myeloma not having achieved remission: Secondary | ICD-10-CM | POA: Diagnosis present

## 2020-10-08 DIAGNOSIS — I824Z3 Acute embolism and thrombosis of unspecified deep veins of distal lower extremity, bilateral: Secondary | ICD-10-CM | POA: Diagnosis not present

## 2020-10-08 DIAGNOSIS — Z853 Personal history of malignant neoplasm of breast: Secondary | ICD-10-CM | POA: Insufficient documentation

## 2020-10-08 DIAGNOSIS — Z86718 Personal history of other venous thrombosis and embolism: Secondary | ICD-10-CM | POA: Insufficient documentation

## 2020-10-08 DIAGNOSIS — Z923 Personal history of irradiation: Secondary | ICD-10-CM | POA: Insufficient documentation

## 2020-10-08 DIAGNOSIS — Z7901 Long term (current) use of anticoagulants: Secondary | ICD-10-CM | POA: Diagnosis not present

## 2020-10-08 LAB — CMP (CANCER CENTER ONLY)
ALT: 20 U/L (ref 0–44)
AST: 16 U/L (ref 15–41)
Albumin: 4 g/dL (ref 3.5–5.0)
Alkaline Phosphatase: 55 U/L (ref 38–126)
Anion gap: 9 (ref 5–15)
BUN: 29 mg/dL — ABNORMAL HIGH (ref 8–23)
CO2: 27 mmol/L (ref 22–32)
Calcium: 9.4 mg/dL (ref 8.9–10.3)
Chloride: 103 mmol/L (ref 98–111)
Creatinine: 1.41 mg/dL — ABNORMAL HIGH (ref 0.44–1.00)
GFR, Estimated: 40 mL/min — ABNORMAL LOW (ref >60)
Glucose, Bld: 96 mg/dL (ref 70–99)
Potassium: 3.8 mmol/L (ref 3.5–5.1)
Sodium: 139 mmol/L (ref 135–145)
Total Bilirubin: 0.6 mg/dL (ref 0.3–1.2)
Total Protein: 6.7 g/dL (ref 6.5–8.1)

## 2020-10-08 LAB — CBC WITH DIFFERENTIAL (CANCER CENTER ONLY)
Abs Immature Granulocytes: 0.04 10*3/uL (ref 0.00–0.07)
Basophils Absolute: 0 10*3/uL (ref 0.0–0.1)
Basophils Relative: 1 %
Eosinophils Absolute: 0.2 10*3/uL (ref 0.0–0.5)
Eosinophils Relative: 2 %
HCT: 44.3 % (ref 36.0–46.0)
Hemoglobin: 14.5 g/dL (ref 12.0–15.0)
Immature Granulocytes: 1 %
Lymphocytes Relative: 44 %
Lymphs Abs: 3.7 10*3/uL (ref 0.7–4.0)
MCH: 32.2 pg (ref 26.0–34.0)
MCHC: 32.7 g/dL (ref 30.0–36.0)
MCV: 98.4 fL (ref 80.0–100.0)
Monocytes Absolute: 0.4 10*3/uL (ref 0.1–1.0)
Monocytes Relative: 5 %
Neutro Abs: 4.2 10*3/uL (ref 1.7–7.7)
Neutrophils Relative %: 47 %
Platelet Count: 194 10*3/uL (ref 150–400)
RBC: 4.5 MIL/uL (ref 3.87–5.11)
RDW: 13.4 % (ref 11.5–15.5)
WBC Count: 8.5 10*3/uL (ref 4.0–10.5)
nRBC: 0 % (ref 0.0–0.2)

## 2020-10-08 MED ORDER — INFLUENZA VAC SPLIT QUAD 0.5 ML IM SUSY
0.5000 mL | PREFILLED_SYRINGE | Freq: Once | INTRAMUSCULAR | Status: AC
  Administered 2020-10-08: 0.5 mL via INTRAMUSCULAR

## 2020-10-08 MED ORDER — INFLUENZA VAC SPLIT QUAD 0.5 ML IM SUSY
PREFILLED_SYRINGE | INTRAMUSCULAR | Status: AC
  Filled 2020-10-08: qty 0.5

## 2020-10-08 MED ORDER — SODIUM CHLORIDE 0.9% FLUSH
10.0000 mL | INTRAVENOUS | Status: DC | PRN
  Administered 2020-10-08: 10 mL
  Filled 2020-10-08: qty 10

## 2020-10-08 MED ORDER — ANTICOAGULANT SODIUM CITRATE 4% (200MG/5ML) IV SOLN
5.0000 mL | Freq: Once | Status: AC
  Administered 2020-10-08: 5 mL
  Filled 2020-10-08: qty 5

## 2020-10-08 NOTE — Patient Instructions (Signed)

## 2020-10-08 NOTE — Progress Notes (Signed)
Hematology and Oncology Follow Up Visit  Marcia [name] [id number] 03/30/1951 69 y.o. 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 She is doing well. She does self breast exams regularly at home.  No adenopathy noted on exam.  She had her mammogram in April which was negative.  She has occasional SOB with over exertion and breaks to rest when needed.  She has headaches that come and go. She states that her BP has been elevated with her new dog. She plans to contact her PCP for follow-up to discuss medication.  No fever, chills, n/v, cough, rash, dizziness, chest pain, palpitations, abdominal pain or changes in bowel or bladder habits.  No swelling, tenderness, numbness or tingling in her extremities.  She states that she tripped and fell last month but thankfully was not injured. No syncope.  She has a good appetite and is staying well hydrated. Her weight is stable.   ECOG Performance Status: 1 - Symptomatic but completely ambulatory  Medications:  Allergies as of 10/08/2020      Reactions   Bactrim [sulfamethoxazole-trimethoprim] Other (See Comments)   ? Renal failure.   Heparin Other (See Comments)   HIT   Zofran [ondansetron] Shortness Of Breath   "felt like throat was closing"   Clarithromycin Diarrhea, Nausea And Vomiting   Latex Rash   Macrodantin [nitrofurantoin]  Rash      Medication List       Accurate as of October 08, 2020 10:44 AM. If you have any questions, ask your nurse or doctor.        acetaminophen 325 MG tablet Commonly known as: TYLENOL Take 650 mg by mouth daily as needed for moderate pain or headache.   amoxicillin 500 MG capsule Commonly known as: AMOXIL Prior to dentist   apixaban 2.5 MG Tabs tablet Commonly known as: ELIQUIS Take 1 tablet (2.5 mg total) by mouth 2 (two) times daily.   Denta 5000 Plus 1.1 % Crea dental cream Generic drug: sodium fluoride USE AS DIRECTED ON PACKAGE TO BRUSH TEETH TWICE DAILY SPIT OUT AFTER BRUSHING   DO NOT RINSE   feeding supplement Liqd Take 237 mLs by mouth 3 (three) times daily between meals. What changed: when to take this   folic acid 1 MG tablet Commonly known as: FOLVITE TAKE 2 TABLETS (2 MG TOTAL) BY MOUTH DAILY.   lidocaine-prilocaine cream Commonly known as: EMLA Apply 1 application topically as needed (local anesthetic).   vitamin C 250 MG tablet Commonly known as: ASCORBIC ACID Take 250 mg by mouth 2 (two) times daily.       Allergies:  Allergies  Allergen Reactions   Bactrim [Sulfamethoxazole-Trimethoprim] Other (See Comments)    ? Renal failure.   Heparin Other (See Comments)    HIT   Zofran [Ondansetron] Shortness Of Breath    "felt like throat was closing"   Clarithromycin Diarrhea and Nausea And Vomiting   Latex Rash   Macrodantin [Nitrofurantoin] Rash    Past Medical History, Surgical history, Social  history, and Family History were reviewed and updated.  Review of Systems: All other 10 point review of systems is negative.   Physical Exam:  vitals were not taken for this visit.   Wt Readings from Last 3 Encounters:  06/18/20 (!) 229 lb (103.9 kg)  06/18/20 (!) 229 lb (103.9 kg)  02/13/20 228 lb (103.4 kg)    Ocular: Sclerae unicteric, pupils equal, round and reactive to light Ear-nose-throat: Oropharynx clear, dentition  fair Lymphatic: No cervical, supraclavicular or axillary adenopathy Lungs no rales or rhonchi, good excursion bilaterally Heart regular rate and rhythm, no murmur appreciated Abd soft, nontender, positive bowel sounds MSK no focal spinal tenderness, no joint edema Neuro: non-focal, well-oriented, appropriate affect Breasts: Deferred this visit. No changes per patient.   Lab Results  Component Value Date   WBC 8.5 10/08/2020   HGB 14.5 10/08/2020   HCT 44.3 10/08/2020   MCV 98.4 10/08/2020   PLT 194 10/08/2020   Lab Results  Component Value Date   FERRITIN 820 (H) 05/17/2018   IRON 272 (H) 05/17/2018   TIBC 295 05/17/2018   UIBC 23 05/17/2018   IRONPCTSAT 92 (H) 05/17/2018   Lab Results  Component Value Date   RETICCTPCT 1.3 03/27/2011   RBC 4.50 10/08/2020   RETICCTABS 56.4 03/27/2011   Lab Results  Component Value Date   KPAFRELGTCHN 42.6 (H) 06/18/2020   LAMBDASER 22.8 06/18/2020   KAPLAMBRATIO 1.87 (H) 06/18/2020   Lab Results  Component Value Date   IGGSERUM 859 06/18/2020   IGA 175 06/18/2020   IGMSERUM 126 06/18/2020   Lab Results  Component Value Date   TOTALPROTELP 6.4 06/18/2020   ALBUMINELP 3.4 06/18/2020   A1GS 0.2 06/18/2020   A2GS 0.7 06/18/2020   BETS 1.1 06/18/2020   BETA2SER 0.4 07/02/2015   GAMS 0.9 06/18/2020   MSPIKE Not Observed 06/18/2020   SPEI Comment 04/22/2019     Chemistry      Component Value Date/Time   NA 140 06/18/2020 1150   NA 141 07/06/2017 0816   NA 141 01/05/2017 0956   K 3.7 06/18/2020 1150   K 3.6 07/06/2017 0816   K 4.3 01/05/2017 0956   CL 104 06/18/2020 1150   CL 101 07/06/2017 0816   CO2 27 06/18/2020 1150   CO2 28 07/06/2017 0816   CO2 23 01/05/2017 0956   BUN 25 (H) 06/18/2020 1150   BUN 28 (H) 07/06/2017 0816   BUN 22.4 01/05/2017 0956   CREATININE 1.33 (H) 06/18/2020 1150   CREATININE 1.6 (H) 07/06/2017 0816   CREATININE 1.4 (H) 01/05/2017 0956      Component Value Date/Time   CALCIUM 9.1  06/18/2020 1150   CALCIUM 9.1 07/06/2017 0816   CALCIUM 9.3 01/05/2017 0956   ALKPHOS 50 06/18/2020 1150   ALKPHOS 58 07/06/2017 0816   ALKPHOS 52 01/05/2017 0956   AST 14 (L) 06/18/2020 1150   AST 14 01/05/2017 0956   ALT 15 06/18/2020 1150   ALT 58 (H) 07/06/2017 0816   ALT 13 01/05/2017 0956   BILITOT 0.6 06/18/2020 1150   BILITOT 0.56 01/05/2017 0956       Impression and Plan: Ms. Kessner is a very pleasant 69 yo caucasian female with past history of kappa light chain myeloma with stem cell transplant. No M-spike noted in July, kappa light chains were 4.26 mg/dL. Protein studies redrawn today and results are pending.  She also has history of locally advanced infiltratin ductal carcinoma of the right breast, ER(-)/PR(-)/HER-2(+), Ki67  of 75%. She completed neoadjuvant chemo with 4 cycles of carbo/Taxol as well as radiation therapy. So far, she continues to do well and there has been no evidence of recurrence.  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 She is on lifelong maintenance anticoagulation with Eliquis.  Follow-up in 4 months with port flush every 8 weeks.  She can contact our office with any questions or concerns.   Laverna Peace, NP 11/15/202110:44 AM

## 2020-10-08 NOTE — Telephone Encounter (Signed)
appt s made and printed for pt per 10/08/20 los... AOM

## 2020-10-09 LAB — IGG, IGA, IGM
IgA: 188 mg/dL (ref 64–422)
IgG (Immunoglobin G), Serum: 837 mg/dL (ref 586–1602)
IgM (Immunoglobulin M), Srm: 125 mg/dL (ref 26–217)

## 2020-10-09 LAB — KAPPA/LAMBDA LIGHT CHAINS
Kappa free light chain: 76.8 mg/L — ABNORMAL HIGH (ref 3.3–19.4)
Kappa, lambda light chain ratio: 3.92 — ABNORMAL HIGH (ref 0.26–1.65)
Lambda free light chains: 19.6 mg/L (ref 5.7–26.3)

## 2020-10-09 LAB — PROTEIN ELECTROPHORESIS, SERUM
A/G Ratio: 1.1 (ref 0.7–1.7)
Albumin ELP: 3.5 g/dL (ref 2.9–4.4)
Alpha-1-Globulin: 0.2 g/dL (ref 0.0–0.4)
Alpha-2-Globulin: 0.8 g/dL (ref 0.4–1.0)
Beta Globulin: 1.3 g/dL (ref 0.7–1.3)
Gamma Globulin: 1 g/dL (ref 0.4–1.8)
Globulin, Total: 3.2 g/dL (ref 2.2–3.9)
Total Protein ELP: 6.7 g/dL (ref 6.0–8.5)

## 2020-12-03 ENCOUNTER — Inpatient Hospital Stay: Payer: 59 | Attending: Hematology & Oncology

## 2020-12-03 ENCOUNTER — Other Ambulatory Visit: Payer: Self-pay

## 2020-12-03 VITALS — BP 147/81 | HR 89 | Temp 98.7°F | Resp 17

## 2020-12-03 DIAGNOSIS — Z171 Estrogen receptor negative status [ER-]: Secondary | ICD-10-CM | POA: Diagnosis not present

## 2020-12-03 DIAGNOSIS — C9001 Multiple myeloma in remission: Secondary | ICD-10-CM | POA: Diagnosis not present

## 2020-12-03 DIAGNOSIS — Z853 Personal history of malignant neoplasm of breast: Secondary | ICD-10-CM | POA: Diagnosis not present

## 2020-12-03 DIAGNOSIS — Z452 Encounter for adjustment and management of vascular access device: Secondary | ICD-10-CM | POA: Insufficient documentation

## 2020-12-03 MED ORDER — ANTICOAGULANT SODIUM CITRATE 4% (200MG/5ML) IV SOLN
5.0000 mL | Freq: Once | Status: AC
  Administered 2020-12-03: 5 mL
  Filled 2020-12-03: qty 5

## 2020-12-03 MED ORDER — SODIUM CHLORIDE 0.9% FLUSH
10.0000 mL | INTRAVENOUS | Status: DC | PRN
  Administered 2020-12-03: 10 mL
  Filled 2020-12-03: qty 10

## 2020-12-03 NOTE — Patient Instructions (Signed)

## 2020-12-08 ENCOUNTER — Other Ambulatory Visit: Payer: Self-pay | Admitting: Hematology & Oncology

## 2020-12-08 DIAGNOSIS — I824Z3 Acute embolism and thrombosis of unspecified deep veins of distal lower extremity, bilateral: Secondary | ICD-10-CM

## 2020-12-08 DIAGNOSIS — I2601 Septic pulmonary embolism with acute cor pulmonale: Secondary | ICD-10-CM

## 2021-01-16 ENCOUNTER — Other Ambulatory Visit: Payer: Self-pay | Admitting: Hematology & Oncology

## 2021-01-16 DIAGNOSIS — Z9889 Other specified postprocedural states: Secondary | ICD-10-CM

## 2021-01-28 ENCOUNTER — Inpatient Hospital Stay: Payer: 59

## 2021-01-28 ENCOUNTER — Inpatient Hospital Stay: Payer: 59 | Attending: Hematology & Oncology

## 2021-01-28 ENCOUNTER — Encounter: Payer: Self-pay | Admitting: Family

## 2021-01-28 ENCOUNTER — Other Ambulatory Visit: Payer: Self-pay

## 2021-01-28 ENCOUNTER — Inpatient Hospital Stay (HOSPITAL_BASED_OUTPATIENT_CLINIC_OR_DEPARTMENT_OTHER): Payer: 59 | Admitting: Family

## 2021-01-28 VITALS — BP 160/95 | HR 83 | Temp 99.1°F | Resp 20 | Ht 64.0 in | Wt 236.4 lb

## 2021-01-28 DIAGNOSIS — I824Z3 Acute embolism and thrombosis of unspecified deep veins of distal lower extremity, bilateral: Secondary | ICD-10-CM

## 2021-01-28 DIAGNOSIS — Z9221 Personal history of antineoplastic chemotherapy: Secondary | ICD-10-CM | POA: Insufficient documentation

## 2021-01-28 DIAGNOSIS — Z9484 Stem cells transplant status: Secondary | ICD-10-CM | POA: Diagnosis not present

## 2021-01-28 DIAGNOSIS — Z7901 Long term (current) use of anticoagulants: Secondary | ICD-10-CM | POA: Insufficient documentation

## 2021-01-28 DIAGNOSIS — I2601 Septic pulmonary embolism with acute cor pulmonale: Secondary | ICD-10-CM | POA: Diagnosis not present

## 2021-01-28 DIAGNOSIS — C50911 Malignant neoplasm of unspecified site of right female breast: Secondary | ICD-10-CM

## 2021-01-28 DIAGNOSIS — C9001 Multiple myeloma in remission: Secondary | ICD-10-CM

## 2021-01-28 DIAGNOSIS — Z923 Personal history of irradiation: Secondary | ICD-10-CM | POA: Insufficient documentation

## 2021-01-28 DIAGNOSIS — Z79899 Other long term (current) drug therapy: Secondary | ICD-10-CM | POA: Diagnosis not present

## 2021-01-28 DIAGNOSIS — Z86711 Personal history of pulmonary embolism: Secondary | ICD-10-CM | POA: Diagnosis not present

## 2021-01-28 DIAGNOSIS — Z86718 Personal history of other venous thrombosis and embolism: Secondary | ICD-10-CM | POA: Diagnosis not present

## 2021-01-28 LAB — CMP (CANCER CENTER ONLY)
ALT: 13 U/L (ref 0–44)
AST: 15 U/L (ref 15–41)
Albumin: 4.2 g/dL (ref 3.5–5.0)
Alkaline Phosphatase: 54 U/L (ref 38–126)
Anion gap: 10 (ref 5–15)
BUN: 24 mg/dL — ABNORMAL HIGH (ref 8–23)
CO2: 28 mmol/L (ref 22–32)
Calcium: 9.3 mg/dL (ref 8.9–10.3)
Chloride: 101 mmol/L (ref 98–111)
Creatinine: 1.5 mg/dL — ABNORMAL HIGH (ref 0.44–1.00)
GFR, Estimated: 37 mL/min — ABNORMAL LOW (ref >60)
Glucose, Bld: 100 mg/dL — ABNORMAL HIGH (ref 70–99)
Potassium: 4 mmol/L (ref 3.5–5.1)
Sodium: 139 mmol/L (ref 135–145)
Total Bilirubin: 0.6 mg/dL (ref 0.3–1.2)
Total Protein: 6.6 g/dL (ref 6.5–8.1)

## 2021-01-28 LAB — CBC WITH DIFFERENTIAL (CANCER CENTER ONLY)
Abs Immature Granulocytes: 0.04 10*3/uL (ref 0.00–0.07)
Basophils Absolute: 0 10*3/uL (ref 0.0–0.1)
Basophils Relative: 1 %
Eosinophils Absolute: 0.2 10*3/uL (ref 0.0–0.5)
Eosinophils Relative: 2 %
HCT: 42.7 % (ref 36.0–46.0)
Hemoglobin: 14.2 g/dL (ref 12.0–15.0)
Immature Granulocytes: 1 %
Lymphocytes Relative: 45 %
Lymphs Abs: 4 10*3/uL (ref 0.7–4.0)
MCH: 33.1 pg (ref 26.0–34.0)
MCHC: 33.3 g/dL (ref 30.0–36.0)
MCV: 99.5 fL (ref 80.0–100.0)
Monocytes Absolute: 0.5 10*3/uL (ref 0.1–1.0)
Monocytes Relative: 5 %
Neutro Abs: 3.9 10*3/uL (ref 1.7–7.7)
Neutrophils Relative %: 46 %
Platelet Count: 182 10*3/uL (ref 150–400)
RBC: 4.29 MIL/uL (ref 3.87–5.11)
RDW: 13.5 % (ref 11.5–15.5)
WBC Count: 8.6 10*3/uL (ref 4.0–10.5)
nRBC: 0 % (ref 0.0–0.2)

## 2021-01-28 MED ORDER — SODIUM CHLORIDE 0.9% FLUSH
10.0000 mL | Freq: Once | INTRAVENOUS | Status: DC
  Filled 2021-01-28: qty 10

## 2021-01-28 MED ORDER — ANTICOAGULANT SODIUM CITRATE 4% (200MG/5ML) IV SOLN
5.0000 mL | Freq: Once | Status: DC
  Filled 2021-01-28: qty 5

## 2021-01-28 MED ORDER — SODIUM CHLORIDE 0.9% FLUSH
10.0000 mL | INTRAVENOUS | Status: DC | PRN
  Administered 2021-01-28 (×2): 10 mL
  Filled 2021-01-28: qty 10

## 2021-01-28 NOTE — Patient Instructions (Signed)
Tunneled Central Venous Catheter Flushing Guide  It is important to flush your tunneled central venous catheter each time you use it, both before and after you use it. Flushing your catheter will help prevent it from clogging. What are the risks? Risks may include:  Infection.  Air getting into the catheter and bloodstream. Supplies needed:  A clean pair of gloves.  A disinfecting wipe. Use an alcohol wipe, chlorhexidine wipe, or iodine wipe as told by your health care provider.  A 10 mL syringe that has been prefilled with saline solution.  An empty 10 mL syringe, if a substance called heparin was injected into your catheter. How to flush your catheter When you flush your catheter, make sure you follow any specific instructions from your health care provider or the manufacturer. These are general guidelines. Flushing your catheter before use If there is heparin in your catheter: 1. Wash your hands with soap and water. 2. Put on gloves. 3. Scrub the injection cap for a minimum of 15 seconds with a disinfecting wipe. 4. Unclamp the catheter. 5. Attach the empty syringe to the injection cap. 6. Pull the syringe plunger back and withdraw 10 mL of blood. 7. Place the syringe into an appropriate waste container. 8. Scrub the injection cap for 15 seconds with a disinfecting wipe. 9. Attach the prefilled syringe to the injection cap. 10. Flush the catheter by pushing the plunger forward until all the liquid from the syringe is in the catheter. 11. Remove the syringe from the injection cap. 12. Clamp the catheter. If there is no heparin in your catheter: 1. Wash your hands with soap and water. 2. Put on gloves. 3. Scrub the injection cap for 15 seconds with a disinfecting wipe. 4. Unclamp the catheter. 5. Attach the prefilled syringe to the injection cap. 6. Flush the catheter by pushing the plunger forward until 5 mL of the liquid from the syringe is in the catheter. 7. Pull back on  the syringe until you see blood in the catheter. 8. If you have been asked to collect any blood, follow your health care provider's instructions. Otherwise, flush the catheter with the rest of the solution from the syringe. 9. Remove the syringe from the injection cap. 10. Clamp the catheter.   Flushing your catheter after use 1. Wash your hands with soap and water. 2. Put on gloves. 3. Scrub the injection cap for 15 seconds with a disinfecting wipe. 4. Unclamp the catheter. 5. Attach the prefilled syringe to the injection cap. 6. Flush the catheter by pushing the plunger forward until all of the liquid from the syringe is in the catheter. 7. Remove the syringe from the injection cap. 8. Clamp the catheter. Problems and solutions  If blood cannot be completely cleared from the injection cap, you may need to have the injection cap replaced.  If the catheter is difficult to flush, use the pulsing method. The pulsing method involves pushing only a few milliliters of solution into the catheter at a time and pausing between pushes.  If you do not see blood in the catheter when you pull back on the syringe, change your body position, such as by raising your arms above your head. Take a deep breath and cough. Then, pull back on the syringe. If you still do not see blood, flush the catheter with a small amount of solution. Then, change positions again and take a breath or cough. Pull back on the syringe again. If you still do not   see blood, finish flushing the catheter and contact your health care provider. Do not use your catheter until your health care provider says it is okay. General tips  Have someone help you flush your catheter, if possible.  Do not force fluid through your catheter.  Do not use a syringe that is larger or smaller than 10 mL. Using a smaller syringe can make the catheter burst.  Do not use your catheter without flushing it first if it has heparin in it. Contact a health  care provider if:  You cannot see any blood in the catheter when you flush it before using it.  Your catheter is difficult to flush. Get help right away if:  You cannot flush the catheter.  The catheter leaks when you flush it or when there is fluid in it.  There are cracks or breaks in the catheter. Summary  It is important to flush your tunneled central venous catheter each time you use it, both before and after you use it.  Scrub the injection cap for 15 seconds with a disinfecting wipe before and after you flush it.  When you flush your catheter, make sure you follow any specific instructions from your health care provider or the manufacturer.  Get help right away if you cannot flush the catheter. This information is not intended to replace advice given to you by your health care provider. Make sure you discuss any questions you have with your health care provider. Document Revised: 01/19/2020 Document Reviewed: 01/26/2019 Elsevier Patient Education  2021 Elsevier Inc.  

## 2021-01-28 NOTE — Progress Notes (Signed)
Hematology and Oncology Follow Up Visit  Marcia [name] [id number] [birth year] 70 y.o. 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 in09/2011  Past Therapy: Neoadjuvant carboplatinum/Taxotere Herceptin/Perjeta- s/p cycle 4 Radiation therapy to the right breast ([place]) Herceptin - adjuvant therapy started09/25/2019 - completed on 04/2019  Current Therapy: Eliquis 2.5 mg po BID - started on 04/22/2019 - maintenance lifelong    Interim History:  Marcia [name] is here today for follow-up. She is doing well but has some fatigue at times.  Bilateral breast exam today was negative. No mass, lesion or rash noted.  No adenopathy noted.  No blood loss noted. No bruising or petechiae.  No M-spike observed at her last visit, IgG level stable at 837 mg/dL and kappa light chains 7.68 mg/dL.  No fever, chills, n/v, cough, rash, dizziness, chest pain, palpitations, abdominal pain or changes in bowel or bladder habits.  No swelling, tenderness, numbness or tingling in her extremities.  Pedal pulses are 2+.  No falls or syncope to report.  She has maintained a good appetite and is staying well hydrated. Her weight is stable at 236 lbs.    ECOG Performance Status: 1 - Symptomatic but completely ambulatory  Medications:  Allergies as of 01/28/2021      Reactions   Bactrim [sulfamethoxazole-trimethoprim] Other (See Comments)   ? Renal failure.   Heparin Other (See Comments)   HIT   Zofran [ondansetron] Shortness Of Breath   "felt like throat was closing"   Clarithromycin Diarrhea, Nausea And Vomiting   Latex Rash   Macrodantin [nitrofurantoin] Rash      Medication List       Accurate as of January 28, 2021 10:40 AM. If you have any  questions, ask your nurse or doctor.        acetaminophen 325 MG tablet Commonly known as: TYLENOL Take 650 mg by mouth daily as needed for moderate pain or headache.   amoxicillin 500 MG capsule Commonly known as: AMOXIL Prior to dentist   Denta 5000 Plus 1.1 % Crea dental cream Generic drug: sodium fluoride USE AS DIRECTED ON PACKAGE TO BRUSH TEETH TWICE DAILY SPIT OUT AFTER BRUSHING   DO NOT RINSE   Eliquis 2.5 MG Tabs tablet Generic drug: apixaban TAKE 1 TABLET BY MOUTH TWICE A DAY   feeding supplement Liqd Take 237 mLs by mouth 3 (three) times daily between meals. What changed: when to take this   folic acid 1 MG tablet Commonly known as: FOLVITE TAKE 2 TABLETS (2 MG TOTAL) BY MOUTH DAILY.   lidocaine-prilocaine cream Commonly known as: EMLA Apply 1 application topically as needed (local anesthetic).   losartan 25 MG tablet Commonly known as: COZAAR Take 25 mg by mouth daily.   vitamin C 250 MG tablet Commonly known as: ASCORBIC ACID Take 250 mg by mouth 2 (two) times daily.       Allergies:  Allergies  Allergen Reactions  . Bactrim [Sulfamethoxazole-Trimethoprim] Other (See Comments)    ? Renal failure.  . Heparin Other (See Comments)    HIT  . Zofran [Ondansetron] Shortness Of Breath    "felt like throat was closing"  . Clarithromycin Diarrhea and Nausea And Vomiting  . Latex Rash  . Macrodantin [Nitrofurantoin] Rash    Past Medical History, Surgical history, Social history, and Family History  were reviewed and updated.  Review of Systems: All other 10 point review of systems is negative.   Physical Exam:  vitals were not taken for this visit.   Wt Readings from Last 3 Encounters:  10/08/20 234 lb (106.1 kg)  06/18/20 (!) 229 lb (103.9 kg)  06/18/20 (!) 229 lb (103.9 kg)    Ocular: Sclerae unicteric, pupils equal, round and reactive to light Ear-nose-throat: Oropharynx clear, dentition fair Lymphatic: No cervical, supraclavicular or  axillary adenopathy Lungs no rales or rhonchi, good excursion bilaterally Heart regular rate and rhythm, no murmur appreciated Abd soft, nontender, positive bowel sounds, no liver or spleen tip palpated on exam, no fluid wave  MSK no focal spinal tenderness, no joint edema Neuro: non-focal, well-oriented, appropriate affect Breasts: Bilateral breast exam negative, no mass, lesion or rash.   Lab Results  Component Value Date   WBC 8.5 10/08/2020   HGB 14.5 10/08/2020   HCT 44.3 10/08/2020   MCV 98.4 10/08/2020   PLT 194 10/08/2020   Lab Results  Component Value Date   FERRITIN 820 (H) 05/17/2018   IRON 272 (H) 05/17/2018   TIBC 295 05/17/2018   UIBC 23 05/17/2018   IRONPCTSAT 92 (H) 05/17/2018   Lab Results  Component Value Date   RETICCTPCT 1.3 03/27/2011   RBC 4.50 10/08/2020   RETICCTABS 56.4 03/27/2011   Lab Results  Component Value Date   KPAFRELGTCHN 76.8 (H) 10/08/2020   LAMBDASER 19.6 10/08/2020   KAPLAMBRATIO 3.92 (H) 10/08/2020   Lab Results  Component Value Date   IGGSERUM 837 10/08/2020   IGA 188 10/08/2020   IGMSERUM 125 10/08/2020   Lab Results  Component Value Date   TOTALPROTELP 6.7 10/08/2020   ALBUMINELP 3.5 10/08/2020   A1GS 0.2 10/08/2020   A2GS 0.8 10/08/2020   BETS 1.3 10/08/2020   BETA2SER 0.4 07/02/2015   GAMS 1.0 10/08/2020   MSPIKE Not Observed 10/08/2020   SPEI Comment 10/08/2020     Chemistry      Component Value Date/Time   NA 139 10/08/2020 1014   NA 141 07/06/2017 0816   NA 141 01/05/2017 0956   K 3.8 10/08/2020 1014   K 3.6 07/06/2017 0816   K 4.3 01/05/2017 0956   CL 103 10/08/2020 1014   CL 101 07/06/2017 0816   CO2 27 10/08/2020 1014   CO2 28 07/06/2017 0816   CO2 23 01/05/2017 0956   BUN 29 (H) 10/08/2020 1014   BUN 28 (H) 07/06/2017 0816   BUN 22.4 01/05/2017 0956   CREATININE 1.41 (H) 10/08/2020 1014   CREATININE 1.6 (H) 07/06/2017 0816   CREATININE 1.4 (H) 01/05/2017 0956      Component Value Date/Time    CALCIUM 9.4 10/08/2020 1014   CALCIUM 9.1 07/06/2017 0816   CALCIUM 9.3 01/05/2017 0956   ALKPHOS 55 10/08/2020 1014   ALKPHOS 58 07/06/2017 0816   ALKPHOS 52 01/05/2017 0956   AST 16 10/08/2020 1014   AST 14 01/05/2017 0956   ALT 20 10/08/2020 1014   ALT 58 (H) 07/06/2017 0816   ALT 13 01/05/2017 0956   BILITOT 0.6 10/08/2020 1014   BILITOT 0.56 01/05/2017 0956       Impression and Plan: Ms. Sharpnack is a very pleasant 70yo caucasian female with past history of kappa light chain myeloma with stem cell transplant. No M-spike noted in July, kappa light chains were 4.26 mg/dL.Protein studies redrawn today and results are pending.  She also has history oflocally advanced infiltratin ductal carcinoma  of the right breast, ER(-)/PR(-)/HER-2(+), Ki67 of 75%.She completed neoadjuvant chemo with 4 cycles of carbo/Taxol as well as radiation therapy. So far, she continues to do well and there has been no evidence of recurrence. Mammogram due again in April.  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 She is on lifelong maintenance anticoagulation with Eliquis.  She continues to tolerate Eliquis well and will continue her same regimen.  Follow-up in 4 months. We will try to get her port flush in 8 weeks scheduled with Forestine Na as this is closer to home for her.  She was encouraged to contact our office with any questions or concerns. We can certainly see her sooner if needed.   Laverna Peace, NP 3/7/202210:40 AM

## 2021-01-29 ENCOUNTER — Telehealth: Payer: Self-pay

## 2021-01-29 LAB — IGG, IGA, IGM
IgA: 175 mg/dL (ref 64–422)
IgG (Immunoglobin G), Serum: 829 mg/dL (ref 586–1602)
IgM (Immunoglobulin M), Srm: 115 mg/dL (ref 26–217)

## 2021-01-29 LAB — KAPPA/LAMBDA LIGHT CHAINS
Kappa free light chain: 118 mg/L — ABNORMAL HIGH (ref 3.3–19.4)
Kappa, lambda light chain ratio: 5.67 — ABNORMAL HIGH (ref 0.26–1.65)
Lambda free light chains: 20.8 mg/L (ref 5.7–26.3)

## 2021-01-29 NOTE — Telephone Encounter (Signed)
Called and left a vm with 01/28/21 los   appt      Zenith Kercheval

## 2021-01-30 LAB — PROTEIN ELECTROPHORESIS, SERUM
A/G Ratio: 1.3 (ref 0.7–1.7)
Albumin ELP: 3.7 g/dL (ref 2.9–4.4)
Alpha-1-Globulin: 0.2 g/dL (ref 0.0–0.4)
Alpha-2-Globulin: 0.7 g/dL (ref 0.4–1.0)
Beta Globulin: 1.1 g/dL (ref 0.7–1.3)
Gamma Globulin: 0.9 g/dL (ref 0.4–1.8)
Globulin, Total: 2.9 g/dL (ref 2.2–3.9)
Total Protein ELP: 6.6 g/dL (ref 6.0–8.5)

## 2021-01-31 ENCOUNTER — Other Ambulatory Visit: Payer: Self-pay | Admitting: Family

## 2021-01-31 ENCOUNTER — Telehealth: Payer: Self-pay | Admitting: Family

## 2021-01-31 DIAGNOSIS — C9000 Multiple myeloma not having achieved remission: Secondary | ICD-10-CM

## 2021-01-31 DIAGNOSIS — C9001 Multiple myeloma in remission: Secondary | ICD-10-CM

## 2021-01-31 NOTE — Telephone Encounter (Signed)
I was able to speak with Marcia [name] and go over recent Myeloma studies. Her light chains are on the rise which is concerning for relapse. I spoke with Dr. [name] and we have order a STAT bone marrow biopsy to further assess. Patient is in agreement with the plan. She will wait for call from radiology to schedule. Once results of BMB are available we will bring her back in to discuss results and treatment plan if needed. No other questions at this time. Patient appreciative of call.

## 2021-02-21 ENCOUNTER — Other Ambulatory Visit: Payer: Self-pay | Admitting: Student

## 2021-02-21 NOTE — Patient Instructions (Signed)
Interventional radiology phone numbers [phone] After hours [phone]    Bone Marrow Aspiration and Bone Marrow Biopsy, Adult, Care After This sheet gives you information about how to care for yourself after your procedure. Your health care provider may also give you more specific instructions. If you have problems or questions, contact your health care provider. What can I expect after the procedure? After the procedure, it is common to have:  Mild pain and tenderness.  Swelling.  Bruising. Follow these instructions at home: Puncture site care  Follow instructions from your health care provider about how to take care of the puncture site. Make sure you: ? Wash your hands with soap and water before and after you change your bandage (dressing). If soap and water are not available, use hand sanitizer.  Check your puncture site every day for signs of infection. Check for: ? More redness, swelling, or pain. ? Fluid or blood. ? Warmth. ? Pus or a bad smell.   Activity  Return to your normal activities as told by your health care provider. Ask your health care provider what activities are safe for you.  Do not lift anything that is heavier than 10 lb (4.5 kg), or the limit that you are told, until your health care provider says that it is safe.  Do not drive for 24 hours if you were given a sedative during your procedure. General instructions  Take over-the-counter and prescription medicines only as told by your health care provider.  Do not take baths, swim, or use a hot tub until your health care provider approves. You may remove your dressing on 02/23/21 and shower.    Put ice on the affected area. To do this: ? Put ice in a plastic bag. ? Place a towel between your skin and the bag. ? Leave the ice on for 20 minutes, 2-3 times a day.  Keep all follow-up visits as told by your health care provider. This is important.   Contact a health care provider if:  Your pain is  not controlled with medicine.  You have a fever.  You have more redness, swelling, or pain around the puncture site.  You have fluid or blood coming from the puncture site.  Your puncture site feels warm to the touch.  You have pus or a bad smell coming from the puncture site. Summary  After the procedure, it is common to have mild pain, tenderness, swelling, and bruising.  Follow instructions from your health care provider about how to take care of the puncture site and what activities are safe for you.  Take over-the-counter and prescription medicines only as told by your health care provider.  Contact a health care provider if you have any signs of infection, such as fluid or blood coming from the puncture site. This information is not intended to replace advice given to you by your health care provider. Make sure you discuss any questions you have with your health care provider. Document Revised: 03/29/2019 Document Reviewed: 03/29/2019 Elsevier Patient Education  2021 [place].      Moderate Conscious Sedation, Adult, Care After This sheet gives you information about how to care for yourself after your procedure. Your health care provider may also give you more specific instructions. If you have problems or questions, contact your health care provider. What can I expect after the procedure? After the procedure, it is common to have:  Sleepiness for several hours.  Impaired judgment for several hours.  Difficulty with balance.  Vomiting if you eat too soon. Follow these instructions at home: For the time period you were told by your health care provider:  Rest.  Do not participate in activities where you could fall or become injured.  Do not drive or use machinery.  Do not drink alcohol.  Do not take sleeping pills or medicines that cause drowsiness.  Do not make important decisions or sign legal documents.  Do not take care of children on your own.       Eating and drinking  Follow the diet recommended by your health care provider.  Drink enough fluid to keep your urine pale yellow.  If you vomit: ? Drink water, juice, or soup when you can drink without vomiting. ? Make sure you have little or no nausea before eating solid foods.   General instructions  Take over-the-counter and prescription medicines only as told by your health care provider.  Have a responsible adult stay with you for the time you are told. It is important to have someone help care for you until you are awake and alert.  Do not smoke.  Keep all follow-up visits as told by your health care provider. This is important. Contact a health care provider if:  You are still sleepy or having trouble with balance after 24 hours.  You feel light-headed.  You keep feeling nauseous or you keep vomiting.  You develop a rash.  You have a fever.  You have redness or swelling around the IV site. Get help right away if:  You have trouble breathing.  You have new-onset confusion at home. Summary  After the procedure, it is common to feel sleepy, have impaired judgment, or feel nauseous if you eat too soon.  Rest after you get home. Know the things you should not do after the procedure.  Follow the diet recommended by your health care provider and drink enough fluid to keep your urine pale yellow.  Get help right away if you have trouble breathing or new-onset confusion at home. This information is not intended to replace advice given to you by your health care provider. Make sure you discuss any questions you have with your health care provider. Document Revised: 03/09/2020 Document Reviewed: 10/06/2019 Elsevier Patient Education  2021 [employer].

## 2021-02-22 ENCOUNTER — Ambulatory Visit (HOSPITAL_COMMUNITY)
Admission: RE | Admit: 2021-02-22 | Discharge: 2021-02-22 | Disposition: A | Discharge status: Home / Self Care | Payer: Medicare Other | Source: Ambulatory Visit | Attending: Family | Admitting: Family

## 2021-02-22 ENCOUNTER — Encounter (HOSPITAL_COMMUNITY): Payer: Self-pay

## 2021-02-22 ENCOUNTER — Other Ambulatory Visit: Payer: Self-pay

## 2021-02-22 DIAGNOSIS — Z853 Personal history of malignant neoplasm of breast: Secondary | ICD-10-CM | POA: Insufficient documentation

## 2021-02-22 DIAGNOSIS — Z9221 Personal history of antineoplastic chemotherapy: Secondary | ICD-10-CM | POA: Diagnosis not present

## 2021-02-22 DIAGNOSIS — C9000 Multiple myeloma not having achieved remission: Secondary | ICD-10-CM | POA: Diagnosis present

## 2021-02-22 LAB — CBC WITH DIFFERENTIAL/PLATELET
Abs Immature Granulocytes: 0.05 10*3/uL (ref 0.00–0.07)
Basophils Absolute: 0.1 10*3/uL (ref 0.0–0.1)
Basophils Relative: 1 %
Eosinophils Absolute: 0.2 10*3/uL (ref 0.0–0.5)
Eosinophils Relative: 2 %
HCT: 43.6 % (ref 36.0–46.0)
Hemoglobin: 14.3 g/dL (ref 12.0–15.0)
Immature Granulocytes: 1 %
Lymphocytes Relative: 44 %
Lymphs Abs: 4.3 10*3/uL — ABNORMAL HIGH (ref 0.7–4.0)
MCH: 33.3 pg (ref 26.0–34.0)
MCHC: 32.8 g/dL (ref 30.0–36.0)
MCV: 101.4 fL — ABNORMAL HIGH (ref 80.0–100.0)
Monocytes Absolute: 0.5 10*3/uL (ref 0.1–1.0)
Monocytes Relative: 5 %
Neutro Abs: 4.6 10*3/uL (ref 1.7–7.7)
Neutrophils Relative %: 47 %
Platelets: 209 10*3/uL (ref 150–400)
RBC: 4.3 MIL/uL (ref 3.87–5.11)
RDW: 13.7 % (ref 11.5–15.5)
WBC: 9.7 10*3/uL (ref 4.0–10.5)
nRBC: 0 % (ref 0.0–0.2)

## 2021-02-22 MED ORDER — ANTICOAGULANT SODIUM CITRATE 4% (200MG/5ML) IV SOLN
5.0000 mL | Freq: Once | Status: DC
  Filled 2021-02-22: qty 5

## 2021-02-22 MED ORDER — LIDOCAINE HCL (PF) 1 % IJ SOLN
INTRAMUSCULAR | Status: AC | PRN
  Administered 2021-02-22: 10 mL

## 2021-02-22 MED ORDER — SODIUM CHLORIDE 0.9 % IV SOLN: INTRAVENOUS | Status: DC

## 2021-02-22 MED ORDER — FENTANYL CITRATE (PF) 100 MCG/2ML IJ SOLN
INTRAMUSCULAR | Status: AC | PRN
  Administered 2021-02-22 (×2): 50 ug via INTRAVENOUS

## 2021-02-22 MED ORDER — FENTANYL CITRATE (PF) 100 MCG/2ML IJ SOLN
INTRAMUSCULAR | Status: AC
  Filled 2021-02-22: qty 2

## 2021-02-22 MED ORDER — MIDAZOLAM HCL 2 MG/2ML IJ SOLN
INTRAMUSCULAR | Status: AC
  Filled 2021-02-22: qty 4

## 2021-02-22 MED ORDER — MIDAZOLAM HCL 2 MG/2ML IJ SOLN
INTRAMUSCULAR | Status: AC | PRN
Start: 2021-02-22 — End: 2021-02-22
  Administered 2021-02-22 (×3): 1 mg via INTRAVENOUS

## 2021-02-22 NOTE — H&P (Signed)
Chief Complaint: Patient was seen in consultation today for image guided bone marrow aspiration and biopsy at the request of Eden M  Referring Physician(s): [place],Sarah M  Supervising Physician: Mir, Sharen Heck  Patient Status: [facility] - Out-pt  History of Present Illness: Marcia [name] is a 70 y.o. 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  The patient is known to our service for IVC filter placement and retrieval in 2019.  Patient had a follow-up visit with her oncology team on 01/28/2021, and was found to have increased kappa light chain on the protein studies.  IR was requested for bone marrow biopsy.   Patient laying in bed, not in acute distress.  Denise headache, fever, chills, shortness of breath, cough, chest pain, abdominal pain, nausea ,vomiting, and bleeding.   Past Medical History:  Diagnosis Date  . Anemia   . Cancer [facility])    Multiple Myeloma  . Chronic kidney disease    d/t multiple myeloma  . Counseling regarding goals of care 02/02/2018  . DVT (deep venous thrombosis) (HCC)    right and left leg  . Hypertension   . 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 Personal history of chemotherapy   . Personal history of radiation therapy   . Primary cancer of right breast without evidence of regional lymph node metastasis (N0) ([place]) 02/01/2018  . Pulmonary embolism ([place])    lung  . TIA (transient ischemic attack) 05/09/2018   [name]    Past Surgical History:  Procedure Laterality Date  . ABDOMINAL HYSTERECTOMY     partial  . APPENDECTOMY    . BREAST LUMPECTOMY Right   . BREAST LUMPECTOMY WITH RADIOACTIVE SEED AND SENTINEL LYMPH NODE BIOPSY Right 07/05/2018   Procedure: RIGHT BREAST LUMPECTOMY WITH RADIOACTIVE SEED AND SENTINEL  LYMPH NODE BIOPSY,INJECT BLUE DYE RIGHT BREAST;  Surgeon: Fanny Skates, MD;  Location: [place];  Service: General;  Laterality: Right;  . BREAST SURGERY     biopsy  . IR IVC FILTER PLMT / S&I /IMG GUID/MOD SED  06/29/2018  . IR IVC FILTER RETRIEVAL / S&I /IMG GUID/MOD SED  10/26/2018  . IR RADIOLOGIST EVAL & MGMT  08/31/2018  . PORTACATH PLACEMENT Left 02/12/2018   Procedure: INSERTION PORT-A-CATH;  Surgeon: Fanny Skates, MD;  Location: [place];  Service: General;  Laterality: Left;  . TUBAL LIGATION      Allergies: Bactrim [sulfamethoxazole-trimethoprim], Heparin, Zofran [ondansetron], Clarithromycin, Latex, and Macrodantin [nitrofurantoin]  Medications: Prior to Admission medications   Medication Sig Start Date End Date Taking? Authorizing Provider  acetaminophen (TYLENOL) 325 MG tablet Take 650 mg by mouth daily as needed for moderate pain or headache.   Yes [provider]  ELIQUIS 2.5 MG TABS tablet TAKE 1 TABLET BY MOUTH TWICE A DAY 12/10/20  Yes Ennever, Rudell Cobb, MD  feeding supplement, ENSURE ENLIVE, (ENSURE ENLIVE) LIQD Take 237 mLs by mouth 3 (three) times daily between meals. Patient taking differently: Take 237 mLs by mouth 2 (two) times daily. 05/22/18  Yes Oretha Milch D, MD  folic acid (FOLVITE) 1 MG tablet TAKE 2 TABLETS (2 MG TOTAL) BY MOUTH DAILY. 06/11/20  Yes [name], MD  lidocaine-prilocaine (EMLA) cream Apply 1 application topically as needed (local anesthetic).   Yes [provider]  losartan (COZAAR) 25  MG tablet Take 25 mg by mouth daily.   Yes [provider]  amoxicillin (AMOXIL) 500 MG capsule Prior to dentist Patient not taking: Reported on 01/28/2021 12/08/18   [provider]     Family History  Problem Relation Age of Onset  . COPD Mother   . COPD Father   . Breast cancer Neg Hx     Social History   Socioeconomic History  . Marital status: Married    Spouse name: Not on file  . Number of children: Not on  file  . Years of education: Not on file  . Highest education level: Not on file  Occupational History  . Not on file  Tobacco Use  . Smoking status: Never Smoker  . Smokeless tobacco: Never Used  . Tobacco comment: never used product  Vaping Use  . Vaping Use: Never used  Substance and Sexual Activity  . Alcohol use: Never    Alcohol/week: 0.0 standard drinks  . Drug use: Never  . Sexual activity: Not on file  Other Topics Concern  . Not on file  Social History Narrative  . Not on file   Social Determinants of Health   Financial Resource Strain: Not on file  Food Insecurity: Not on file  Transportation Needs: Not on file  Physical Activity: Not on file  Stress: Not on file  Social Connections: Not on file     Review of Systems: A 12 point ROS discussed and pertinent positives are indicated in the HPI above.  All other systems are negative.   Vital Signs: BP (!) 145/78   Pulse 89   Temp (!) 97.3 F (36.3 C) (Oral)   Resp 18   Ht _0  (1.626 m)   Wt 235 lb (106.6 kg)   SpO2 99%   BMI 40.34 kg/m   Physical Examination   Constitutional:      Appearance: Normal appearance.  Cardiovascular:     Rate and Rhythm: Normal rate and regular rhythm.     Pulses: Normal pulses.     Heart sounds: Normal heart sounds.  Pulmonary:     Effort: Pulmonary effort is normal.     Breath sounds: Normal breath sounds.  Abdominal:     General: Abdomen is flat. Bowel sounds are normal.     Palpations: Abdomen is soft.  Neurological:     Mental Status: Patient is alert and oriented to person, place, and time.     MD Evaluation Airway: WNL Heart: WNL Abdomen: WNL Chest/ Lungs: WNL ASA  Classification: 3 Mallampati/Airway Score: Two  Imaging: No results found.  Labs:  CBC: Recent Labs    06/18/20 1150 10/08/20 1014 01/28/21 1044 02/22/21 0750  WBC 7.9 8.5 8.6 9.7  HGB 14.0 14.5 14.2 14.3  HCT 42.8 44.3 42.7 43.6  PLT 194 194 182 209    COAGS: No results  for input(s): INR, APTT in the last 8760 hours.  BMP: Recent Labs    06/18/20 1150 10/08/20 1014 01/28/21 1044  NA 140 139 139  K 3.7 3.8 4.0  CL 104 103 101  CO2 _1 GLUCOSE 97 96 100*  BUN 25* 29* 24*  CALCIUM 9.1 9.4 9.3  CREATININE 1.33* 1.41* 1.50*  GFRNONAA 40* 40* 37*  GFRAA 46*  --   --     LIVER FUNCTION TESTS: Recent Labs    06/18/20 1150 10/08/20 1014 01/28/21 1044  BILITOT 0.6 0.6 0.6  AST 14* 16 15  ALT 15  20 13  ALKPHOS 50 55 54  PROT 6.7 6.7 6.6  ALBUMIN 4.2 4.0 4.2    TUMOR MARKERS: No results for input(s): AFPTM, CEA, CA199, CHROMGRNA in the last 8760 hours.  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 IR was requested for image guided bone marrow biopsy and aspiration due to concern for relapsing myeloma. Patient presents to IR today for the procedure. N.p.o. since midnight CBC stable Afebrile  Risks and benefits of bone marrow aspiration and biopsy was discussed with the patient and/or patient's family including, but not limited to bleeding, infection, damage to adjacent structures or low yield requiring additional tests.  All of the questions were answered and there is agreement to proceed.  Consent signed and in chart.  Thank you for this interesting consult.  I greatly enjoyed meeting Marcia [name] and look forward to participating in their care.  A copy of this report was sent to the requesting provider on this date.  Electronically Signed: Tera Mater, PA-C 02/22/2021, 8:27 AM   I spent a total of    15 Minutes in face to face in clinical consultation, greater than 50% of which was counseling/coordinating care for bone marrow biopsy.

## 2021-02-22 NOTE — Procedures (Signed)
Interventional Radiology Procedure Note  Procedure: Bone Marrow biopsy  Indication: Myeloma  Findings: Please refer to procedural dictation for full description.  Complications: None  EBL: < 10 mL  Miachel Roux, MD [phone]

## 2021-02-22 NOTE — Discharge Instructions (Signed)
Interventional radiology phone numbers [phone] After hours [phone]  Bone Marrow Aspiration and Bone Marrow Biopsy, Adult, Care After This sheet gives you information about how to care for yourself after your procedure. Your health care provider may also give you more specific instructions. If you have problems or questions, contact your health care provider. What can I expect after the procedure? After the procedure, it is common to have:  Mild pain and tenderness.  Swelling.  Bruising. Follow these instructions at home: Puncture site care  Follow instructions from your health care provider about how to take care of the puncture site. Make sure you: ? Wash your hands with soap and water before and after you change your bandage (dressing). If soap and water are not available, use hand sanitizer.   Check your puncture site every day for signs of infection. Check for: ? More redness, swelling, or pain. ? Fluid or blood. ? Warmth. ? Pus or a bad smell.   Activity  Return to your normal activities as told by your health care provider. Ask your health care provider what activities are safe for you.  Do not lift anything that is heavier than 10 lb (4.5 kg), or the limit that you are told, until your health care provider says that it is safe.  Do not drive for 24 hours if you were given a sedative during your procedure. General instructions  Take over-the-counter and prescription medicines only as told by your health care provider.  Do not take baths, swim, or use a hot tub until your health care provider approves. You may remove your dressing 02/23/21 and shower around 10 AM.  Put ice on the affected area. To do this: ? Put ice in a plastic bag. ? Place a towel between your skin and the bag. ? Leave the ice on for 20 minutes, 2-3 times a day.  Keep all follow-up visits as told by your health care provider. This is important.   Contact a health care provider if:  Your  pain is not controlled with medicine.  You have a fever.  You have more redness, swelling, or pain around the puncture site.  You have fluid or blood coming from the puncture site.  Your puncture site feels warm to the touch.  You have pus or a bad smell coming from the puncture site. Summary  After the procedure, it is common to have mild pain, tenderness, swelling, and bruising.  Follow instructions from your health care provider about how to take care of the puncture site and what activities are safe for you.  Take over-the-counter and prescription medicines only as told by your health care provider.  Contact a health care provider if you have any signs of infection, such as fluid or blood coming from the puncture site. This information is not intended to replace advice given to you by your health care provider. Make sure you discuss any questions you have with your health care provider. Document Revised: 03/29/2019 Document Reviewed: 03/29/2019 Elsevier Patient Education  2021 [place].    Moderate Conscious Sedation, Adult, Care After This sheet gives you information about how to care for yourself after your procedure. Your health care provider may also give you more specific instructions. If you have problems or questions, contact your health care provider. What can I expect after the procedure? After the procedure, it is common to have:  Sleepiness for several hours.  Impaired judgment for several hours.  Difficulty with balance.  Vomiting if  you eat too soon. Follow these instructions at home: For the time period you were told by your health care provider:  Rest.  Do not participate in activities where you could fall or become injured.  Do not drive or use machinery.  Do not drink alcohol.  Do not take sleeping pills or medicines that cause drowsiness.  Do not make important decisions or sign legal documents.  Do not take care of children on your own.       Eating and drinking  Follow the diet recommended by your health care provider.  Drink enough fluid to keep your urine pale yellow.  If you vomit: ? Drink water, juice, or soup when you can drink without vomiting. ? Make sure you have little or no nausea before eating solid foods.   General instructions  Take over-the-counter and prescription medicines only as told by your health care provider.  Have a responsible adult stay with you for the time you are told. It is important to have someone help care for you until you are awake and alert.  Do not smoke.  Keep all follow-up visits as told by your health care provider. This is important. Contact a health care provider if:  You are still sleepy or having trouble with balance after 24 hours.  You feel light-headed.  You keep feeling nauseous or you keep vomiting.  You develop a rash.  You have a fever.  You have redness or swelling around the IV site. Get help right away if:  You have trouble breathing.  You have new-onset confusion at home. Summary  After the procedure, it is common to feel sleepy, have impaired judgment, or feel nauseous if you eat too soon.  Rest after you get home. Know the things you should not do after the procedure.  Follow the diet recommended by your health care provider and drink enough fluid to keep your urine pale yellow.  Get help right away if you have trouble breathing or new-onset confusion at home. This information is not intended to replace advice given to you by your health care provider. Make sure you discuss any questions you have with your health care provider. Document Revised: 03/09/2020 Document Reviewed: 10/06/2019 Elsevier Patient Education  2021 [employer].

## 2021-03-04 ENCOUNTER — Encounter (HOSPITAL_COMMUNITY): Payer: Self-pay | Admitting: Hematology & Oncology

## 2021-03-05 ENCOUNTER — Encounter (HOSPITAL_COMMUNITY): Payer: Self-pay | Admitting: Hematology & Oncology

## 2021-03-11 LAB — SURGICAL PATHOLOGY

## 2021-03-25 ENCOUNTER — Encounter: Payer: Self-pay | Admitting: Family

## 2021-03-29 ENCOUNTER — Inpatient Hospital Stay (HOSPITAL_COMMUNITY): Payer: Medicare Other | Attending: Hematology

## 2021-03-29 ENCOUNTER — Other Ambulatory Visit: Payer: Self-pay

## 2021-03-29 VITALS — BP 176/77 | HR 86 | Temp 97.8°F | Resp 19

## 2021-03-29 DIAGNOSIS — C9001 Multiple myeloma in remission: Secondary | ICD-10-CM | POA: Insufficient documentation

## 2021-03-29 DIAGNOSIS — Z452 Encounter for adjustment and management of vascular access device: Secondary | ICD-10-CM | POA: Diagnosis not present

## 2021-03-29 MED ORDER — ANTICOAGULANT SODIUM CITRATE 4% (200MG/5ML) IV SOLN
5.0000 mL | Freq: Once | Status: AC
Start: 2021-03-29 — End: 2021-03-29
  Administered 2021-03-29: 5 mL
  Filled 2021-03-29: qty 5

## 2021-03-29 NOTE — Progress Notes (Signed)
Marcia [name] presented for Portacath access and flush. Portacath located left chest wall accessed with  H 20 needle. Good blood return present.  flushed with 5 ml of Sodium Citrateand needle removed intact. Procedure without incident. Patient tolerated procedure well.   Vitals stable and discharged home from clinic ambulatory. Follow up as scheduled.

## 2021-03-29 NOTE — Patient Instructions (Signed)
[place]  Discharge Instructions: Thank you for choosing [facility] to provide your oncology and hematology care.  If you have a lab appointment with the [facility], please come in thru the Main Entrance and check in at the main information desk.  Wear comfortable clothing and clothing appropriate for easy access to any Portacath or PICC line.   We strive to give you quality time with your provider. You may need to reschedule your appointment if you arrive late (15 or more minutes).  Arriving late affects you and other patients whose appointments are after yours.  Also, if you miss three or more appointments without notifying the office, you may be dismissed from the clinic at the provider's discretion.      For prescription refill requests, have your pharmacy contact our office and allow 72 hours for refills to be completed.       To help prevent nausea and vomiting after your treatment, we encourage you to take your nausea medication as directed.   Items with * indicate a potential emergency and should be followed up as soon as possible or go to the Emergency Department if any problems should occur.  Please show the CHEMOTHERAPY ALERT CARD or IMMUNOTHERAPY ALERT CARD at check-in to the Emergency Department and triage nurse.  Should you have questions after your visit or need to cancel or reschedule your appointment, please contact [facility] [phone]  and follow the prompts.  Office hours are 8:00 a.m. to 4:30 p.m. Monday - Friday. Please note that voicemails left after 4:00 p.m. may not be returned until the following business day.  We are closed weekends and major holidays. You have access to a nurse at all times for urgent questions. Please call the main number to the clinic [phone] and follow the prompts.  For any non-urgent questions, you may also contact your provider using MyChart. We now offer e-Visits for anyone 16 and older to  request care online for non-urgent symptoms. For details visit [email or web address].   Also download the MyChart app! Go to the app store, search "MyChart", open the app, select [facility], and log in with your MyChart username and password.  Due to Covid, a mask is required upon entering the hospital/clinic. If you do not have a mask, one will be given to you upon arrival. For doctor visits, patients may have 1 support person aged 55 or older with them. For treatment visits, patients cannot have anyone with them due to current Covid guidelines and our immunocompromised population.

## 2021-04-06 ENCOUNTER — Ambulatory Visit
Admission: RE | Admit: 2021-04-06 | Discharge: 2021-04-06 | Disposition: A | Discharge status: Home / Self Care | Payer: Medicare Other | Source: Ambulatory Visit | Attending: Hematology & Oncology | Admitting: Hematology & Oncology

## 2021-04-06 ENCOUNTER — Other Ambulatory Visit: Payer: Self-pay

## 2021-04-06 DIAGNOSIS — Z9889 Other specified postprocedural states: Secondary | ICD-10-CM

## 2021-04-19 ENCOUNTER — Telehealth: Payer: Self-pay | Admitting: *Deleted

## 2021-04-19 NOTE — Telephone Encounter (Signed)
Per staff message Dallas Schimke 04/19/21 - called and gave upcoming appointments

## 2021-04-23 ENCOUNTER — Inpatient Hospital Stay: Payer: Medicare Other | Attending: Hematology & Oncology

## 2021-04-23 ENCOUNTER — Inpatient Hospital Stay: Payer: Medicare Other

## 2021-04-23 ENCOUNTER — Encounter: Payer: Self-pay | Admitting: Hematology & Oncology

## 2021-04-23 ENCOUNTER — Inpatient Hospital Stay (HOSPITAL_BASED_OUTPATIENT_CLINIC_OR_DEPARTMENT_OTHER): Payer: Medicare Other | Admitting: Hematology & Oncology

## 2021-04-23 ENCOUNTER — Other Ambulatory Visit: Payer: Self-pay

## 2021-04-23 ENCOUNTER — Other Ambulatory Visit: Payer: Self-pay | Admitting: *Deleted

## 2021-04-23 VITALS — HR 74 | Temp 98.4°F | Resp 17 | Wt 239.0 lb

## 2021-04-23 VITALS — BP 150/75

## 2021-04-23 DIAGNOSIS — Z7901 Long term (current) use of anticoagulants: Secondary | ICD-10-CM | POA: Insufficient documentation

## 2021-04-23 DIAGNOSIS — Z86718 Personal history of other venous thrombosis and embolism: Secondary | ICD-10-CM | POA: Insufficient documentation

## 2021-04-23 DIAGNOSIS — C9001 Multiple myeloma in remission: Secondary | ICD-10-CM

## 2021-04-23 DIAGNOSIS — Z86711 Personal history of pulmonary embolism: Secondary | ICD-10-CM | POA: Diagnosis not present

## 2021-04-23 DIAGNOSIS — Z79899 Other long term (current) drug therapy: Secondary | ICD-10-CM | POA: Diagnosis not present

## 2021-04-23 DIAGNOSIS — I2601 Septic pulmonary embolism with acute cor pulmonale: Secondary | ICD-10-CM

## 2021-04-23 DIAGNOSIS — Z9484 Stem cells transplant status: Secondary | ICD-10-CM | POA: Diagnosis not present

## 2021-04-23 DIAGNOSIS — I824Z3 Acute embolism and thrombosis of unspecified deep veins of distal lower extremity, bilateral: Secondary | ICD-10-CM

## 2021-04-23 DIAGNOSIS — C50911 Malignant neoplasm of unspecified site of right female breast: Secondary | ICD-10-CM

## 2021-04-23 LAB — CBC WITH DIFFERENTIAL (CANCER CENTER ONLY)
Abs Immature Granulocytes: 0.06 10*3/uL (ref 0.00–0.07)
Basophils Absolute: 0 10*3/uL (ref 0.0–0.1)
Basophils Relative: 1 %
Eosinophils Absolute: 0.2 10*3/uL (ref 0.0–0.5)
Eosinophils Relative: 3 %
HCT: 40.2 % (ref 36.0–46.0)
Hemoglobin: 13.2 g/dL (ref 12.0–15.0)
Immature Granulocytes: 1 %
Lymphocytes Relative: 44 %
Lymphs Abs: 3.6 10*3/uL (ref 0.7–4.0)
MCH: 32.7 pg (ref 26.0–34.0)
MCHC: 32.8 g/dL (ref 30.0–36.0)
MCV: 99.5 fL (ref 80.0–100.0)
Monocytes Absolute: 0.5 10*3/uL (ref 0.1–1.0)
Monocytes Relative: 6 %
Neutro Abs: 3.5 10*3/uL (ref 1.7–7.7)
Neutrophils Relative %: 45 %
Platelet Count: 177 10*3/uL (ref 150–400)
RBC: 4.04 MIL/uL (ref 3.87–5.11)
RDW: 13.5 % (ref 11.5–15.5)
WBC Count: 7.9 10*3/uL (ref 4.0–10.5)
nRBC: 0 % (ref 0.0–0.2)

## 2021-04-23 LAB — CMP (CANCER CENTER ONLY)
ALT: 22 U/L (ref 0–44)
AST: 18 U/L (ref 15–41)
Albumin: 4.1 g/dL (ref 3.5–5.0)
Alkaline Phosphatase: 58 U/L (ref 38–126)
Anion gap: 10 (ref 5–15)
BUN: 27 mg/dL — ABNORMAL HIGH (ref 8–23)
CO2: 27 mmol/L (ref 22–32)
Calcium: 9.3 mg/dL (ref 8.9–10.3)
Chloride: 102 mmol/L (ref 98–111)
Creatinine: 1.27 mg/dL — ABNORMAL HIGH (ref 0.44–1.00)
GFR, Estimated: 45 mL/min — ABNORMAL LOW (ref >60)
Glucose, Bld: 85 mg/dL (ref 70–99)
Potassium: 4.1 mmol/L (ref 3.5–5.1)
Sodium: 139 mmol/L (ref 135–145)
Total Bilirubin: 0.5 mg/dL (ref 0.3–1.2)
Total Protein: 6.5 g/dL (ref 6.5–8.1)

## 2021-04-23 MED ORDER — ANTICOAGULANT SODIUM CITRATE 4% (200MG/5ML) IV SOLN
5.0000 mL | Freq: Once | Status: AC
  Administered 2021-04-23: 5 mL
  Filled 2021-04-23: qty 5

## 2021-04-23 MED ORDER — SODIUM CHLORIDE 0.9% FLUSH
10.0000 mL | INTRAVENOUS | Status: DC | PRN
Start: 2021-04-23 — End: 2021-04-23
  Administered 2021-04-23: 10 mL
  Filled 2021-04-23: qty 10

## 2021-04-23 MED ORDER — POMALIDOMIDE 3 MG PO CAPS: 3.0000 mg | ORAL_CAPSULE | Freq: Every day | ORAL | 0 refills | Status: DC

## 2021-04-23 NOTE — Progress Notes (Signed)
Patient education for Pomalyst reviewed with patient.  Teach back done.

## 2021-04-23 NOTE — Progress Notes (Signed)
Hematology and Oncology Follow Up Visit  Marcia [name] [id number] [birth year] 70 y.o. 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 is here today for follow-up.  Unfortunately, I think we have a problem now with respect to her myeloma.  Over the past year, the Kappa light chains have been slowly going up.  In March, light chain was 11.8 mg/dL.  In November 2021 the light chain was 7.7 mg/dL.  Based on this, we went ahead and did a bone marrow biopsy on her.  This was done on 02/22/2021.  The pathology report ([id number]) showed a normocellular marrow with focal monoclonal plasma cells.  There was kappa restricted plasma cells that was concerning for early/minimal involvement by plasma cell myeloma.  Thankfully, the cytogenetics and the Atlantis panel for myeloma were both negative.  However, I do worry about the possibility that the light chains are a sign that myeloma is recurring.  It has been 11 years since she had her stem cell transplant.  Otherwise, she is feeling okay.  She really has had no specific complaints.  She has had no problems with her breast cancer.  She had completed her treatment for breast cancer 2 years ago.  She is on Eliquis right now for pulmonary emboli.  I think that we probably do need to treat her.  I think would be very reasonable to try  pomalidomide/Velcade/Decadron.  I think this would be a very active combination for her.  She comes in with her husband.  It was nice to see both of them.  She did have a nice Memorial Day weekend.  She is eating well.  She is having no problems with pain.  I do think we plan to get her set up with a PET scan to look at her bones to make sure there is no bony lesions suggestive of myeloma.  There is no change in bowel or bladder habits.  She has had no problems with bleeding.  Overall, performance status is ECOG 1.    Medications:  Allergies as of 04/23/2021      Reactions   Bactrim [sulfamethoxazole-trimethoprim] Other (See Comments)   ? Renal failure.   Heparin Other (See Comments)   HIT   Zofran [ondansetron] Shortness Of Breath   "felt like throat was closing"   Clarithromycin Diarrhea, Nausea And Vomiting   Latex Rash   Macrodantin [nitrofurantoin] Rash      Medication List       Accurate as of Apr 23, 2021  2:41 PM. If you have any questions, ask your nurse or doctor.        acetaminophen 325 MG tablet Commonly known as: TYLENOL Take 650 mg by mouth daily as needed for moderate pain or headache.   amoxicillin 500 MG capsule Commonly known as: AMOXIL Prior to dentist   Eliquis 2.5 MG Tabs tablet Generic drug: apixaban TAKE 1 TABLET BY MOUTH TWICE A DAY   feeding supplement Liqd Take 237 mLs by  mouth 3 (three) times daily between meals. What changed: when to take this   folic acid 1 MG tablet Commonly known as: FOLVITE TAKE 2 TABLETS (2 MG TOTAL) BY MOUTH DAILY.   lidocaine-prilocaine cream Commonly known as: EMLA Apply 1 application topically as needed (local anesthetic).   losartan 25 MG tablet Commonly known as: COZAAR Take 25 mg by mouth daily.       Allergies:  Allergies  Allergen Reactions  . Bactrim [Sulfamethoxazole-Trimethoprim] Other (See Comments)    ? Renal failure.  . Heparin Other (See Comments)    HIT  . Zofran [Ondansetron]  Shortness Of Breath    "felt like throat was closing"  . Clarithromycin Diarrhea and Nausea And Vomiting  . Latex Rash  . Macrodantin [Nitrofurantoin] Rash    Past Medical History, Surgical history, Social history, and Family History were reviewed and updated.  Review of Systems: Review of Systems  Constitutional: Negative.   HENT: Negative.   Eyes: Negative.   Respiratory: Negative.   Cardiovascular: Negative.   Gastrointestinal: Negative.   Genitourinary: Negative.   Musculoskeletal: Negative.   Skin: Negative.   Neurological: Negative.   Endo/Heme/Allergies: Negative.   Psychiatric/Behavioral: Negative.       Physical Exam:  blood pressure is 150/75 (abnormal).   Wt Readings from Last 3 Encounters:  04/23/21 239 lb (108.4 kg)  02/22/21 235 lb (106.6 kg)  01/28/21 236 lb 6.4 oz (107.2 kg)    Physical Exam Vitals reviewed.  Constitutional:      Comments: Her breast exam shows left breast no masses, edema or erythema.  There is no left axillary adenopathy.  Right breast shows a well-healed lumpectomy at about the 7 o'clock position.  She has some hyperpigmentation of the right breast.  There is some slight firmness of the right breast from radiation.  No distinct masses noted in the right breast.  There is no right axillary adenopathy.  HENT:     Head: Normocephalic and atraumatic.  Eyes:     Pupils: Pupils are equal, round, and reactive to light.  Cardiovascular:     Rate and Rhythm: Normal rate and regular rhythm.     Heart sounds: Normal heart sounds.  Pulmonary:     Effort: Pulmonary effort is normal.     Breath sounds: Normal breath sounds.  Abdominal:     General: Bowel sounds are normal.     Palpations: Abdomen is soft.  Musculoskeletal:        General: No tenderness or deformity. Normal range of motion.     Cervical back: Normal range of motion.  Lymphadenopathy:     Cervical: No cervical adenopathy.  Skin:    General: Skin is warm and dry.      Findings: No erythema or rash.  Neurological:     Mental Status: She is alert and oriented to person, place, and time.  Psychiatric:        Behavior: Behavior normal.        Thought Content: Thought content normal.        Judgment: Judgment normal.      Lab Results  Component Value Date   WBC 7.9 04/23/2021   HGB 13.2 04/23/2021   HCT 40.2 04/23/2021   MCV 99.5 04/23/2021   PLT 177 04/23/2021   Lab Results  Component Value Date   FERRITIN 820 (H) 05/17/2018   IRON 272 (H) 05/17/2018   TIBC 295 05/17/2018   UIBC 23 05/17/2018   IRONPCTSAT 92 (H) 05/17/2018  Lab Results  Component Value Date   RETICCTPCT 1.3 03/27/2011   RBC 4.04 04/23/2021   RETICCTABS 56.4 03/27/2011   Lab Results  Component Value Date   KPAFRELGTCHN 118.0 (H) 01/28/2021   LAMBDASER 20.8 01/28/2021   KAPLAMBRATIO 5.67 (H) 01/28/2021   Lab Results  Component Value Date   IGGSERUM 829 01/28/2021   IGA 175 01/28/2021   IGMSERUM 115 01/28/2021   Lab Results  Component Value Date   TOTALPROTELP 6.6 01/28/2021   ALBUMINELP 3.7 01/28/2021   A1GS 0.2 01/28/2021   A2GS 0.7 01/28/2021   BETS 1.1 01/28/2021   BETA2SER 0.4 07/02/2015   GAMS 0.9 01/28/2021   MSPIKE Not Observed 01/28/2021   SPEI Comment 01/28/2021     Chemistry      Component Value Date/Time   NA 139 04/23/2021 1240   NA 141 07/06/2017 0816   NA 141 01/05/2017 0956   K 4.1 04/23/2021 1240   K 3.6 07/06/2017 0816   K 4.3 01/05/2017 0956   CL 102 04/23/2021 1240   CL 101 07/06/2017 0816   CO2 27 04/23/2021 1240   CO2 28 07/06/2017 0816   CO2 23 01/05/2017 0956   BUN 27 (H) 04/23/2021 1240   BUN 28 (H) 07/06/2017 0816   BUN 22.4 01/05/2017 0956   CREATININE 1.27 (H) 04/23/2021 1240   CREATININE 1.6 (H) 07/06/2017 0816   CREATININE 1.4 (H) 01/05/2017 0956      Component Value Date/Time   CALCIUM 9.3 04/23/2021 1240   CALCIUM 9.1 07/06/2017 0816   CALCIUM 9.3 01/05/2017 0956   ALKPHOS 58 04/23/2021 1240   ALKPHOS 58  07/06/2017 0816   ALKPHOS 52 01/05/2017 0956   AST 18 04/23/2021 1240   AST 14 01/05/2017 0956   ALT 22 04/23/2021 1240   ALT 58 (H) 07/06/2017 0816   ALT 13 01/05/2017 0956   BILITOT 0.5 04/23/2021 1240   BILITOT 0.56 01/05/2017 0956       Impression and Plan: [name] is a very pleasant 70 yo caucasian female with past history of kappa light chain myeloma with stem cell transplant.   She has  been diagnosed with locally advanced infiltratin ductal carcinoma of the right breast, ER(-)/PR(-)/HER-2(+), Ki67 of 75%.  She had very aggressive therapy for this.  Is now been 2 years since she had treatment.  Again, I have to believe that the myeloma is found to recur.  We will go ahead and get her started on treatment.  I think that pomalidomide/Velcade/Decadron would be a reasonable option for her.  I went over the side effects.  She understands all of this well.  I do think that we will see a good response.  She has a good performance status so we certainly can be aggressive.  We will try to get treatment started on her in a week or so.  I will plan to see her back when she starts her second cycle of Velcade.  [name], MD 5/31/20222:41 PM

## 2021-04-23 NOTE — Patient Instructions (Signed)
Implanted Port Insertion, Care After This sheet gives you information about how to care for yourself after your procedure. Your health care provider may also give you more specific instructions. If you have problems or questions, contact your health care provider. What can I expect after the procedure? After the procedure, it is common to have:  Discomfort at the port insertion site.  Bruising on the skin over the port. This should improve over 3-4 days. Follow these instructions at home: Port care  After your port is placed, you will get a manufacturer's information card. The card has information about your port. Keep this card with you at all times.  Take care of the port as told by your health care provider. Ask your health care provider if you or a family member can get training for taking care of the port at home. A home health care nurse may also take care of the port.  Make sure to remember what type of port you have. Incision care  Follow instructions from your health care provider about how to take care of your port insertion site. Make sure you: ? Wash your hands with soap and water before and after you change your bandage (dressing). If soap and water are not available, use hand sanitizer. ? Change your dressing as told by your health care provider. ? Leave stitches (sutures), skin glue, or adhesive strips in place. These skin closures may need to stay in place for 2 weeks or longer. If adhesive strip edges start to loosen and curl up, you may trim the loose edges. Do not remove adhesive strips completely unless your health care provider tells you to do that.  Check your port insertion site every day for signs of infection. Check for: ? Redness, swelling, or pain. ? Fluid or blood. ? Warmth. ? Pus or a bad smell.      Activity  Return to your normal activities as told by your health care provider. Ask your health care provider what activities are safe for you.  Do not  lift anything that is heavier than 10 lb (4.5 kg), or the limit that you are told, until your health care provider says that it is safe. General instructions  Take over-the-counter and prescription medicines only as told by your health care provider.  Do not take baths, swim, or use a hot tub until your health care provider approves. Ask your health care provider if you may take showers. You may only be allowed to take sponge baths.  Do not drive for 24 hours if you were given a sedative during your procedure.  Wear a medical alert bracelet in case of an emergency. This will tell any health care providers that you have a port.  Keep all follow-up visits as told by your health care provider. This is important. Contact a health care provider if:  You cannot flush your port with saline as directed, or you cannot draw blood from the port.  You have a fever or chills.  You have redness, swelling, or pain around your port insertion site.  You have fluid or blood coming from your port insertion site.  Your port insertion site feels warm to the touch.  You have pus or a bad smell coming from the port insertion site. Get help right away if:  You have chest pain or shortness of breath.  You have bleeding from your port that you cannot control. Summary  Take care of the port as told by your   health care provider. Keep the manufacturer's information card with you at all times.  Change your dressing as told by your health care provider.  Contact a health care provider if you have a fever or chills or if you have redness, swelling, or pain around your port insertion site.  Keep all follow-up visits as told by your health care provider. This information is not intended to replace advice given to you by your health care provider. Make sure you discuss any questions you have with your health care provider. Document Revised: 06/08/2018 Document Reviewed: 06/08/2018 Elsevier Patient Education   2021 Elsevier Inc.  

## 2021-04-24 ENCOUNTER — Telehealth: Payer: Self-pay

## 2021-04-24 ENCOUNTER — Telehealth: Payer: Self-pay | Admitting: Pharmacy Technician

## 2021-04-24 ENCOUNTER — Other Ambulatory Visit: Payer: Self-pay | Admitting: *Deleted

## 2021-04-24 ENCOUNTER — Encounter: Payer: Self-pay | Admitting: Family

## 2021-04-24 LAB — IGG, IGA, IGM
IgA: 163 mg/dL (ref 64–422)
IgG (Immunoglobin G), Serum: 812 mg/dL (ref 586–1602)
IgM (Immunoglobulin M), Srm: 104 mg/dL (ref 26–217)

## 2021-04-24 LAB — KAPPA/LAMBDA LIGHT CHAINS
Kappa free light chain: 192.6 mg/L — ABNORMAL HIGH (ref 3.3–19.4)
Kappa, lambda light chain ratio: 8.92 — ABNORMAL HIGH (ref 0.26–1.65)
Lambda free light chains: 21.6 mg/L (ref 5.7–26.3)

## 2021-04-24 MED ORDER — LORAZEPAM 0.5 MG PO TABS: 0.5000 mg | ORAL_TABLET | Freq: Three times a day (TID) | ORAL | 0 refills | Status: DC

## 2021-04-24 NOTE — Telephone Encounter (Signed)
Erroneous encounter

## 2021-04-24 NOTE — Telephone Encounter (Signed)
Called and left a vm with appts per 04/23/21 los   Marcia [name]

## 2021-04-25 LAB — PROTEIN ELECTROPHORESIS, SERUM
A/G Ratio: 1.3 (ref 0.7–1.7)
Albumin ELP: 3.4 g/dL (ref 2.9–4.4)
Alpha-1-Globulin: 0.1 g/dL (ref 0.0–0.4)
Alpha-2-Globulin: 0.7 g/dL (ref 0.4–1.0)
Beta Globulin: 1.1 g/dL (ref 0.7–1.3)
Gamma Globulin: 0.8 g/dL (ref 0.4–1.8)
Globulin, Total: 2.7 g/dL (ref 2.2–3.9)
Total Protein ELP: 6.1 g/dL (ref 6.0–8.5)

## 2021-05-01 ENCOUNTER — Other Ambulatory Visit: Payer: Self-pay | Admitting: *Deleted

## 2021-05-01 DIAGNOSIS — C9001 Multiple myeloma in remission: Secondary | ICD-10-CM

## 2021-05-02 ENCOUNTER — Inpatient Hospital Stay: Payer: Medicare Other

## 2021-05-02 ENCOUNTER — Other Ambulatory Visit: Payer: Self-pay

## 2021-05-02 ENCOUNTER — Other Ambulatory Visit: Payer: Self-pay | Admitting: *Deleted

## 2021-05-02 ENCOUNTER — Inpatient Hospital Stay: Payer: Medicare Other | Attending: Hematology & Oncology

## 2021-05-02 VITALS — BP 145/78 | HR 78 | Temp 98.6°F | Resp 17

## 2021-05-02 DIAGNOSIS — C9001 Multiple myeloma in remission: Secondary | ICD-10-CM

## 2021-05-02 DIAGNOSIS — Z5112 Encounter for antineoplastic immunotherapy: Secondary | ICD-10-CM | POA: Insufficient documentation

## 2021-05-02 DIAGNOSIS — C50911 Malignant neoplasm of unspecified site of right female breast: Secondary | ICD-10-CM

## 2021-05-02 DIAGNOSIS — Z95828 Presence of other vascular implants and grafts: Secondary | ICD-10-CM

## 2021-05-02 LAB — CMP (CANCER CENTER ONLY)
ALT: 19 U/L (ref 0–44)
AST: 16 U/L (ref 15–41)
Albumin: 3.9 g/dL (ref 3.5–5.0)
Alkaline Phosphatase: 48 U/L (ref 38–126)
Anion gap: 10 (ref 5–15)
BUN: 29 mg/dL — ABNORMAL HIGH (ref 8–23)
CO2: 26 mmol/L (ref 22–32)
Calcium: 9.3 mg/dL (ref 8.9–10.3)
Chloride: 104 mmol/L (ref 98–111)
Creatinine: 1.28 mg/dL — ABNORMAL HIGH (ref 0.44–1.00)
GFR, Estimated: 45 mL/min — ABNORMAL LOW (ref >60)
Glucose, Bld: 92 mg/dL (ref 70–99)
Potassium: 3.9 mmol/L (ref 3.5–5.1)
Sodium: 140 mmol/L (ref 135–145)
Total Bilirubin: 0.5 mg/dL (ref 0.3–1.2)
Total Protein: 6.2 g/dL — ABNORMAL LOW (ref 6.5–8.1)

## 2021-05-02 LAB — CBC WITH DIFFERENTIAL (CANCER CENTER ONLY)
Abs Immature Granulocytes: 0.16 10*3/uL — ABNORMAL HIGH (ref 0.00–0.07)
Basophils Absolute: 0 10*3/uL (ref 0.0–0.1)
Basophils Relative: 1 %
Eosinophils Absolute: 0.2 10*3/uL (ref 0.0–0.5)
Eosinophils Relative: 3 %
HCT: 40.5 % (ref 36.0–46.0)
Hemoglobin: 13.5 g/dL (ref 12.0–15.0)
Immature Granulocytes: 2 %
Lymphocytes Relative: 42 %
Lymphs Abs: 3.2 10*3/uL (ref 0.7–4.0)
MCH: 33.3 pg (ref 26.0–34.0)
MCHC: 33.3 g/dL (ref 30.0–36.0)
MCV: 99.8 fL (ref 80.0–100.0)
Monocytes Absolute: 0.5 10*3/uL (ref 0.1–1.0)
Monocytes Relative: 6 %
Neutro Abs: 3.5 10*3/uL (ref 1.7–7.7)
Neutrophils Relative %: 46 %
Platelet Count: 190 10*3/uL (ref 150–400)
RBC: 4.06 MIL/uL (ref 3.87–5.11)
RDW: 13.6 % (ref 11.5–15.5)
WBC Count: 7.5 10*3/uL (ref 4.0–10.5)
nRBC: 0 % (ref 0.0–0.2)

## 2021-05-02 MED ORDER — SODIUM CHLORIDE 0.9% FLUSH
10.0000 mL | Freq: Once | INTRAVENOUS | Status: AC
  Administered 2021-05-02: 10 mL via INTRAVENOUS
  Filled 2021-05-02: qty 10

## 2021-05-02 MED ORDER — SODIUM CHLORIDE 0.9% FLUSH
10.0000 mL | INTRAVENOUS | Status: DC | PRN
  Administered 2021-05-02: 10 mL
  Filled 2021-05-02: qty 10

## 2021-05-02 MED ORDER — PROCHLORPERAZINE MALEATE 10 MG PO TABS
10.0000 mg | ORAL_TABLET | Freq: Once | ORAL | Status: AC
  Administered 2021-05-02: 10 mg via ORAL

## 2021-05-02 MED ORDER — ALTEPLASE 2 MG IJ SOLR
2.0000 mg | Freq: Once | INTRAMUSCULAR | Status: DC | PRN
  Filled 2021-05-02: qty 2

## 2021-05-02 MED ORDER — DEXAMETHASONE 4 MG PO TABS
ORAL_TABLET | ORAL | Status: AC
  Filled 2021-05-02: qty 3

## 2021-05-02 MED ORDER — PROCHLORPERAZINE MALEATE 10 MG PO TABS
ORAL_TABLET | ORAL | Status: AC
  Filled 2021-05-02: qty 1

## 2021-05-02 MED ORDER — BORTEZOMIB CHEMO SQ INJECTION 3.5 MG (2.5MG/ML)
1.3000 mg/m2 | Freq: Once | INTRAMUSCULAR | Status: AC
  Administered 2021-05-02: 2.75 mg via SUBCUTANEOUS
  Filled 2021-05-02: qty 1.1

## 2021-05-02 MED ORDER — ANTICOAGULANT SODIUM CITRATE 4% (200MG/5ML) IV SOLN
5.0000 mL | Freq: Once | Status: DC
  Administered 2021-05-02 (×2): 5 mL
  Filled 2021-05-02: qty 5

## 2021-05-02 MED ORDER — FAMCICLOVIR 250 MG PO TABS: 250.0000 mg | ORAL_TABLET | Freq: Every day | ORAL | 3 refills | Status: DC

## 2021-05-02 MED ORDER — ANTICOAGULANT SODIUM CITRATE 4% (200MG/5ML) IV SOLN: 5.0000 mL | Freq: Once | Status: DC

## 2021-05-02 MED ORDER — DEXAMETHASONE 4 MG PO TABS
12.0000 mg | ORAL_TABLET | ORAL | Status: DC
  Administered 2021-05-02: 12 mg via ORAL

## 2021-05-02 NOTE — Patient Instructions (Signed)
Bortezomib injection What is this medicine? BORTEZOMIB (bor TEZ oh mib) targets proteins in cancer cells and stops the cancer cells from growing. It treats multiple myeloma and mantle cell lymphoma. This medicine may be used for other purposes; ask your health care provider or pharmacist if you have questions. COMMON BRAND NAME(S): Velcade What should I tell my health care provider before I take this medicine? They need to know if you have any of these conditions:  dehydration  diabetes (high blood sugar)  heart disease  liver disease  tingling of the fingers or toes or other nerve disorder  an unusual or allergic reaction to bortezomib, mannitol, boron, other medicines, foods, dyes, or preservatives  pregnant or trying to get pregnant  breast-feeding How should I use this medicine? This medicine is injected into a vein or under the skin. It is given by a health care provider in a hospital or clinic setting. Talk to your health care provider about the use of this medicine in children. Special care may be needed. Overdosage: If you think you have taken too much of this medicine contact a poison control center or emergency room at once. NOTE: This medicine is only for you. Do not share this medicine with others. What if I miss a dose? Keep appointments for follow-up doses. It is important not to miss your dose. Call your health care provider if you are unable to keep an appointment. What may interact with this medicine? This medicine may interact with the following medications:  ketoconazole  rifampin This list may not describe all possible interactions. Give your health care provider a list of all the medicines, herbs, non-prescription drugs, or dietary supplements you use. Also tell them if you smoke, drink alcohol, or use illegal drugs. Some items may interact with your medicine. What should I watch for while using this medicine? Your condition will be monitored carefully while  you are receiving this medicine. You may need blood work done while you are taking this medicine. You may get drowsy or dizzy. Do not drive, use machinery, or do anything that needs mental alertness until you know how this medicine affects you. Do not stand up or sit up quickly, especially if you are an older patient. This reduces the risk of dizzy or fainting spells This medicine may increase your risk of getting an infection. Call your health care provider for advice if you get a fever, chills, sore throat, or other symptoms of a cold or flu. Do not treat yourself. Try to avoid being around people who are sick. Check with your health care provider if you have severe diarrhea, nausea, and vomiting, or if you sweat a lot. The loss of too much body fluid may make it dangerous for you to take this medicine. Do not become pregnant while taking this medicine or for 7 months after stopping it. Women should inform their health care provider if they wish to become pregnant or think they might be pregnant. Men should not father a child while taking this medicine and for 4 months after stopping it. There is a potential for serious harm to an unborn child. Talk to your health care provider for more information. Do not breast-feed an infant while taking this medicine or for 2 months after stopping it. This medicine may make it more difficult to get pregnant or father a child. Talk to your health care provider if you are concerned about your fertility. What side effects may I notice from receiving this medicine?   Side effects that you should report to your doctor or health care professional as soon as possible:  allergic reactions (skin rash; itching or hives; swelling of the face, lips, or tongue)  bleeding (bloody or black, tarry stools; red or dark brown urine; spitting up blood or brown material that looks like coffee grounds; red spots on the skin; unusual bruising or bleeding from the eye, gums, or  nose)  blurred vision or changes in vision  confusion  constipation  headache  heart failure (trouble breathing; fast, irregular heartbeat; sudden weight gain; swelling of the ankles, feet, hands)  infection (fever, chills, cough, sore throat, pain or trouble passing urine)  lack or loss of appetite  liver injury (dark yellow or brown urine; general ill feeling or flu-like symptoms; loss of appetite, right upper belly pain; yellowing of the eyes or skin)  low blood pressure (dizziness; feeling faint or lightheaded, falls; unusually weak or tired)  muscle cramps  pain, redness, or irritation at site where injected  pain, tingling, numbness in the hands or feet  seizures  trouble breathing  unusual bruising or bleeding Side effects that usually do not require medical attention (report to your doctor or health care professional if they continue or are bothersome):  diarrhea  nausea  stomach pain  trouble sleeping  vomiting This list may not describe all possible side effects. Call your doctor for medical advice about side effects. You may report side effects to FDA at 1-800-FDA-1088. Where should I keep my medicine? This medicine is given in a hospital or clinic. It will not be stored at home. NOTE: This sheet is a summary. It may not cover all possible information. If you have questions about this medicine, talk to your doctor, pharmacist, or health care provider.  2021 Elsevier/Gold Standard (2020-11-01 13:22:53)  

## 2021-05-02 NOTE — Patient Instructions (Signed)
Implanted Port Insertion, Care After This sheet gives you information about how to care for yourself after your procedure. Your health care provider may also give you more specific instructions. If you have problems or questions, contact your health care provider. What can I expect after the procedure? After the procedure, it is common to have:  Discomfort at the port insertion site.  Bruising on the skin over the port. This should improve over 3-4 days. Follow these instructions at home: Port care  After your port is placed, you will get a manufacturer's information card. The card has information about your port. Keep this card with you at all times.  Take care of the port as told by your health care provider. Ask your health care provider if you or a family member can get training for taking care of the port at home. A home health care nurse may also take care of the port.  Make sure to remember what type of port you have. Incision care  Follow instructions from your health care provider about how to take care of your port insertion site. Make sure you: ? Wash your hands with soap and water before and after you change your bandage (dressing). If soap and water are not available, use hand sanitizer. ? Change your dressing as told by your health care provider. ? Leave stitches (sutures), skin glue, or adhesive strips in place. These skin closures may need to stay in place for 2 weeks or longer. If adhesive strip edges start to loosen and curl up, you may trim the loose edges. Do not remove adhesive strips completely unless your health care provider tells you to do that.  Check your port insertion site every day for signs of infection. Check for: ? Redness, swelling, or pain. ? Fluid or blood. ? Warmth. ? Pus or a bad smell.      Activity  Return to your normal activities as told by your health care provider. Ask your health care provider what activities are safe for you.  Do not  lift anything that is heavier than 10 lb (4.5 kg), or the limit that you are told, until your health care provider says that it is safe. General instructions  Take over-the-counter and prescription medicines only as told by your health care provider.  Do not take baths, swim, or use a hot tub until your health care provider approves. Ask your health care provider if you may take showers. You may only be allowed to take sponge baths.  Do not drive for 24 hours if you were given a sedative during your procedure.  Wear a medical alert bracelet in case of an emergency. This will tell any health care providers that you have a port.  Keep all follow-up visits as told by your health care provider. This is important. Contact a health care provider if:  You cannot flush your port with saline as directed, or you cannot draw blood from the port.  You have a fever or chills.  You have redness, swelling, or pain around your port insertion site.  You have fluid or blood coming from your port insertion site.  Your port insertion site feels warm to the touch.  You have pus or a bad smell coming from the port insertion site. Get help right away if:  You have chest pain or shortness of breath.  You have bleeding from your port that you cannot control. Summary  Take care of the port as told by your   health care provider. Keep the manufacturer's information card with you at all times.  Change your dressing as told by your health care provider.  Contact a health care provider if you have a fever or chills or if you have redness, swelling, or pain around your port insertion site.  Keep all follow-up visits as told by your health care provider. This information is not intended to replace advice given to you by your health care provider. Make sure you discuss any questions you have with your health care provider. Document Revised: 06/08/2018 Document Reviewed: 06/08/2018 Elsevier Patient Education   2021 Elsevier Inc.  

## 2021-05-03 ENCOUNTER — Ambulatory Visit (HOSPITAL_COMMUNITY): Payer: Medicare Other

## 2021-05-08 ENCOUNTER — Other Ambulatory Visit: Payer: Self-pay | Admitting: *Deleted

## 2021-05-08 DIAGNOSIS — C9001 Multiple myeloma in remission: Secondary | ICD-10-CM

## 2021-05-09 ENCOUNTER — Inpatient Hospital Stay: Payer: Medicare Other

## 2021-05-09 ENCOUNTER — Other Ambulatory Visit: Payer: Self-pay

## 2021-05-09 VITALS — BP 109/46 | HR 84 | Temp 98.2°F | Resp 17

## 2021-05-09 DIAGNOSIS — C50911 Malignant neoplasm of unspecified site of right female breast: Secondary | ICD-10-CM

## 2021-05-09 DIAGNOSIS — C9001 Multiple myeloma in remission: Secondary | ICD-10-CM

## 2021-05-09 DIAGNOSIS — Z5112 Encounter for antineoplastic immunotherapy: Secondary | ICD-10-CM | POA: Diagnosis not present

## 2021-05-09 LAB — CMP (CANCER CENTER ONLY)
ALT: 20 U/L (ref 0–44)
AST: 15 U/L (ref 15–41)
Albumin: 4 g/dL (ref 3.5–5.0)
Alkaline Phosphatase: 56 U/L (ref 38–126)
Anion gap: 9 (ref 5–15)
BUN: 29 mg/dL — ABNORMAL HIGH (ref 8–23)
CO2: 28 mmol/L (ref 22–32)
Calcium: 9.3 mg/dL (ref 8.9–10.3)
Chloride: 100 mmol/L (ref 98–111)
Creatinine: 1.38 mg/dL — ABNORMAL HIGH (ref 0.44–1.00)
GFR, Estimated: 41 mL/min — ABNORMAL LOW (ref >60)
Glucose, Bld: 91 mg/dL (ref 70–99)
Potassium: 4 mmol/L (ref 3.5–5.1)
Sodium: 137 mmol/L (ref 135–145)
Total Bilirubin: 0.6 mg/dL (ref 0.3–1.2)
Total Protein: 6 g/dL — ABNORMAL LOW (ref 6.5–8.1)

## 2021-05-09 LAB — CBC WITH DIFFERENTIAL (CANCER CENTER ONLY)
Abs Immature Granulocytes: 0.16 10*3/uL — ABNORMAL HIGH (ref 0.00–0.07)
Basophils Absolute: 0 10*3/uL (ref 0.0–0.1)
Basophils Relative: 1 %
Eosinophils Absolute: 0.2 10*3/uL (ref 0.0–0.5)
Eosinophils Relative: 2 %
HCT: 39.6 % (ref 36.0–46.0)
Hemoglobin: 13.3 g/dL (ref 12.0–15.0)
Immature Granulocytes: 2 %
Lymphocytes Relative: 37 %
Lymphs Abs: 3.1 10*3/uL (ref 0.7–4.0)
MCH: 33.7 pg (ref 26.0–34.0)
MCHC: 33.6 g/dL (ref 30.0–36.0)
MCV: 100.3 fL — ABNORMAL HIGH (ref 80.0–100.0)
Monocytes Absolute: 1.2 10*3/uL — ABNORMAL HIGH (ref 0.1–1.0)
Monocytes Relative: 14 %
Neutro Abs: 3.8 10*3/uL (ref 1.7–7.7)
Neutrophils Relative %: 44 %
Platelet Count: 151 10*3/uL (ref 150–400)
RBC: 3.95 MIL/uL (ref 3.87–5.11)
RDW: 13.6 % (ref 11.5–15.5)
WBC Count: 8.5 10*3/uL (ref 4.0–10.5)
nRBC: 0 % (ref 0.0–0.2)

## 2021-05-09 MED ORDER — ANTICOAGULANT SODIUM CITRATE 4% (200MG/5ML) IV SOLN
5.0000 mL | Freq: Once | Status: AC
Start: 2021-05-09 — End: 2021-05-09
  Administered 2021-05-09: 5 mL
  Filled 2021-05-09: qty 5

## 2021-05-09 MED ORDER — PROCHLORPERAZINE MALEATE 10 MG PO TABS
ORAL_TABLET | ORAL | Status: AC
  Filled 2021-05-09: qty 1

## 2021-05-09 MED ORDER — BORTEZOMIB CHEMO SQ INJECTION 3.5 MG (2.5MG/ML)
1.3000 mg/m2 | Freq: Once | INTRAMUSCULAR | Status: AC
  Administered 2021-05-09: 2.75 mg via SUBCUTANEOUS
  Filled 2021-05-09: qty 1.1

## 2021-05-09 MED ORDER — TRAMADOL HCL 50 MG PO TABS: 50.0000 mg | ORAL_TABLET | Freq: Two times a day (BID) | ORAL | 0 refills | Status: DC | PRN

## 2021-05-09 MED ORDER — PROCHLORPERAZINE MALEATE 10 MG PO TABS
10.0000 mg | ORAL_TABLET | Freq: Once | ORAL | Status: AC
  Administered 2021-05-09: 10 mg via ORAL

## 2021-05-09 NOTE — Telephone Encounter (Signed)
Patient complained of pain in back when standing. States 10/10 pain when standing. Takes tylneol at home with no relief. Discussed with MD. Patient request something that doesn't make her dizzy or sleepy. Verbal oredr for ultram 50mg  every 12 hours as needed. Order sent to patients pharmacy

## 2021-05-09 NOTE — Progress Notes (Signed)
To treat today per MD with creatinine 1.38

## 2021-05-09 NOTE — Patient Instructions (Signed)
Implanted Port Home Guide An implanted port is a device that is placed under the skin. It is usually placed in the chest. The device can be used to give IV medicine, to take blood, or for dialysis. You may have an implanted port if: You need IV medicine that would be irritating to the small veins in your hands or arms. You need IV medicines, such as antibiotics, for a long period of time. You need IV nutrition for a long period of time. You need dialysis. When you have a port, your health care provider can choose to use the port instead of veins in your arms for these procedures. You may have fewer limitations when using a port than you would if you used other types of long-term IVs, and you will likely be able to return to normal activities afteryour incision heals. An implanted port has two main parts: Reservoir. The reservoir is the part where a needle is inserted to give medicines or draw blood. The reservoir is round. After it is placed, it appears as a small, raised area under your skin. Catheter. The catheter is a thin, flexible tube that connects the reservoir to a vein. Medicine that is inserted into the reservoir goes into the catheter and then into the vein. How is my port accessed? To access your port: A numbing cream may be placed on the skin over the port site. Your health care provider will put on a mask and sterile gloves. The skin over your port will be cleaned carefully with a germ-killing soap and allowed to dry. Your health care provider will gently pinch the port and insert a needle into it. Your health care provider will check for a blood return to make sure the port is in the vein and is not clogged. If your port needs to remain accessed to get medicine continuously (constant infusion), your health care provider will place a clear bandage (dressing) over the needle site. The dressing and needle will need to be changed every week, or as told by your health care provider. What  is flushing? Flushing helps keep the port from getting clogged. Follow instructions from your health care provider about how and when to flush the port. Ports are usually flushed with saline solution or a medicine called heparin. The need for flushing will depend on how the port is used: If the port is only used from time to time to give medicines or draw blood, the port may need to be flushed: Before and after medicines have been given. Before and after blood has been drawn. As part of routine maintenance. Flushing may be recommended every 4-6 weeks. If a constant infusion is running, the port may not need to be flushed. Throw away any syringes in a disposal container that is meant for sharp items (sharps container). You can buy a sharps container from a pharmacy, or you can make one by using an empty hard plastic bottle with a cover. How long will my port stay implanted? The port can stay in for as long as your health care provider thinks it is needed. When it is time for the port to come out, a surgery will be done to remove it. The surgery will be similar to the procedure that was done to putthe port in. Follow these instructions at home:  Flush your port as told by your health care provider. If you need an infusion over several days, follow instructions from your health care provider about how to take   care of your port site. Make sure you: Wash your hands with soap and water before you change your dressing. If soap and water are not available, use alcohol-based hand sanitizer. Change your dressing as told by your health care provider. Place any used dressings or infusion bags into a plastic bag. Throw that bag in the trash. Keep the dressing that covers the needle clean and dry. Do not get it wet. Do not use scissors or sharp objects near the tube. Keep the tube clamped, unless it is being used. Check your port site every day for signs of infection. Check for: Redness, swelling, or  pain. Fluid or blood. Pus or a bad smell. Protect the skin around the port site. Avoid wearing bra straps that rub or irritate the site. Protect the skin around your port from seat belts. Place a soft pad over your chest if needed. Bathe or shower as told by your health care provider. The site may get wet as long as you are not actively receiving an infusion. Return to your normal activities as told by your health care provider. Ask your health care provider what activities are safe for you. Carry a medical alert card or wear a medical alert bracelet at all times. This will let health care providers know that you have an implanted port in case of an emergency. Get help right away if: You have redness, swelling, or pain at the port site. You have fluid or blood coming from your port site. You have pus or a bad smell coming from the port site. You have a fever. Summary Implanted ports are usually placed in the chest for long-term IV access. Follow instructions from your health care provider about flushing the port and changing bandages (dressings). Take care of the area around your port by avoiding clothing that puts pressure on the area, and by watching for signs of infection. Protect the skin around your port from seat belts. Place a soft pad over your chest if needed. Get help right away if you have a fever or you have redness, swelling, pain, drainage, or a bad smell at the port site. This information is not intended to replace advice given to you by your health care provider. Make sure you discuss any questions you have with your healthcare provider. Document Revised: 03/26/2020 Document Reviewed: 03/26/2020 Elsevier Patient Education  2022 Elsevier Inc.  

## 2021-05-14 ENCOUNTER — Other Ambulatory Visit: Payer: Self-pay | Admitting: *Deleted

## 2021-05-14 MED ORDER — POMALIDOMIDE 3 MG PO CAPS: 3.0000 mg | ORAL_CAPSULE | Freq: Every day | ORAL | 0 refills | Status: DC

## 2021-05-15 ENCOUNTER — Other Ambulatory Visit: Payer: Self-pay | Admitting: *Deleted

## 2021-05-15 DIAGNOSIS — C9001 Multiple myeloma in remission: Secondary | ICD-10-CM

## 2021-05-15 MED ORDER — POMALIDOMIDE 3 MG PO CAPS: 3.0000 mg | ORAL_CAPSULE | Freq: Every day | ORAL | 0 refills | Status: DC

## 2021-05-16 ENCOUNTER — Inpatient Hospital Stay: Payer: Medicare Other

## 2021-05-16 ENCOUNTER — Other Ambulatory Visit: Payer: Self-pay

## 2021-05-16 VITALS — BP 141/69 | HR 84 | Temp 98.9°F | Resp 16

## 2021-05-16 DIAGNOSIS — C9001 Multiple myeloma in remission: Secondary | ICD-10-CM

## 2021-05-16 DIAGNOSIS — C50911 Malignant neoplasm of unspecified site of right female breast: Secondary | ICD-10-CM

## 2021-05-16 DIAGNOSIS — Z5112 Encounter for antineoplastic immunotherapy: Secondary | ICD-10-CM | POA: Diagnosis not present

## 2021-05-16 LAB — COMPREHENSIVE METABOLIC PANEL
ALT: 17 U/L (ref 0–44)
AST: 14 U/L — ABNORMAL LOW (ref 15–41)
Albumin: 3.9 g/dL (ref 3.5–5.0)
Alkaline Phosphatase: 55 U/L (ref 38–126)
Anion gap: 7 (ref 5–15)
BUN: 21 mg/dL (ref 8–23)
CO2: 29 mmol/L (ref 22–32)
Calcium: 8.8 mg/dL — ABNORMAL LOW (ref 8.9–10.3)
Chloride: 101 mmol/L (ref 98–111)
Creatinine, Ser: 1.37 mg/dL — ABNORMAL HIGH (ref 0.44–1.00)
GFR, Estimated: 41 mL/min — ABNORMAL LOW (ref >60)
Glucose, Bld: 111 mg/dL — ABNORMAL HIGH (ref 70–99)
Potassium: 4.1 mmol/L (ref 3.5–5.1)
Sodium: 137 mmol/L (ref 135–145)
Total Bilirubin: 0.6 mg/dL (ref 0.3–1.2)
Total Protein: 6.3 g/dL — ABNORMAL LOW (ref 6.5–8.1)

## 2021-05-16 LAB — CBC WITH DIFFERENTIAL (CANCER CENTER ONLY)
Abs Immature Granulocytes: 0.09 10*3/uL — ABNORMAL HIGH (ref 0.00–0.07)
Basophils Absolute: 0.1 10*3/uL (ref 0.0–0.1)
Basophils Relative: 1 %
Eosinophils Absolute: 0.2 10*3/uL (ref 0.0–0.5)
Eosinophils Relative: 4 %
HCT: 38.5 % (ref 36.0–46.0)
Hemoglobin: 12.5 g/dL (ref 12.0–15.0)
Immature Granulocytes: 2 %
Lymphocytes Relative: 38 %
Lymphs Abs: 2.3 10*3/uL (ref 0.7–4.0)
MCH: 33 pg (ref 26.0–34.0)
MCHC: 32.5 g/dL (ref 30.0–36.0)
MCV: 101.6 fL — ABNORMAL HIGH (ref 80.0–100.0)
Monocytes Absolute: 0.8 10*3/uL (ref 0.1–1.0)
Monocytes Relative: 13 %
Neutro Abs: 2.6 10*3/uL (ref 1.7–7.7)
Neutrophils Relative %: 42 %
Platelet Count: 145 10*3/uL — ABNORMAL LOW (ref 150–400)
RBC: 3.79 MIL/uL — ABNORMAL LOW (ref 3.87–5.11)
RDW: 13.5 % (ref 11.5–15.5)
WBC Count: 6 10*3/uL (ref 4.0–10.5)
nRBC: 0 % (ref 0.0–0.2)

## 2021-05-16 MED ORDER — ANTICOAGULANT SODIUM CITRATE 4% (200MG/5ML) IV SOLN
5.0000 mL | Freq: Once | Status: AC
Start: 2021-05-16 — End: 2021-05-16
  Administered 2021-05-16: 5 mL
  Filled 2021-05-16: qty 5

## 2021-05-16 MED ORDER — BORTEZOMIB CHEMO SQ INJECTION 3.5 MG (2.5MG/ML)
1.3000 mg/m2 | Freq: Once | INTRAMUSCULAR | Status: AC
  Administered 2021-05-16: 2.75 mg via SUBCUTANEOUS
  Filled 2021-05-16: qty 1.1

## 2021-05-16 MED ORDER — DEXAMETHASONE 4 MG PO TABS
ORAL_TABLET | ORAL | Status: AC
  Filled 2021-05-16: qty 3

## 2021-05-16 MED ORDER — SODIUM CHLORIDE 0.9% FLUSH
10.0000 mL | INTRAVENOUS | Status: DC | PRN
  Administered 2021-05-16: 10 mL
  Filled 2021-05-16: qty 10

## 2021-05-16 MED ORDER — DEXAMETHASONE 4 MG PO TABS
12.0000 mg | ORAL_TABLET | ORAL | Status: DC
  Administered 2021-05-16: 12 mg via ORAL

## 2021-05-16 MED ORDER — DEXAMETHASONE 4 MG PO TABS
ORAL_TABLET | ORAL | Status: AC
  Filled 2021-05-16: qty 1

## 2021-05-16 MED ORDER — PROCHLORPERAZINE MALEATE 10 MG PO TABS: 10.0000 mg | ORAL_TABLET | Freq: Once | ORAL | Status: DC

## 2021-05-16 NOTE — Patient Instructions (Signed)

## 2021-05-16 NOTE — Patient Instructions (Signed)
[facility] [place]  Discharge Instructions: Thank you for choosing [facility] to provide your oncology and hematology care.   If you have a lab appointment with the [facility], please go directly to the [facility] and check in at the registration area.  Wear comfortable clothing and clothing appropriate for easy access to any Portacath or PICC line.   We strive to give you quality time with your provider. You may need to reschedule your appointment if you arrive late (15 or more minutes).  Arriving late affects you and other patients whose appointments are after yours.  Also, if you miss three or more appointments without notifying the office, you may be dismissed from the clinic at the provider's discretion.      For prescription refill requests, have your pharmacy contact our office and allow 72 hours for refills to be completed.    Today you received the following chemotherapy and/or immunotherapy agents Velcade      To help prevent nausea and vomiting after your treatment, we encourage you to take your nausea medication as directed.  BELOW ARE SYMPTOMS THAT SHOULD BE REPORTED IMMEDIATELY: *FEVER GREATER THAN 100.4 F (38 C) OR HIGHER *CHILLS OR SWEATING *NAUSEA AND VOMITING THAT IS NOT CONTROLLED WITH YOUR NAUSEA MEDICATION *UNUSUAL SHORTNESS OF BREATH *UNUSUAL BRUISING OR BLEEDING *URINARY PROBLEMS (pain or burning when urinating, or frequent urination) *BOWEL PROBLEMS (unusual diarrhea, constipation, pain near the anus) TENDERNESS IN MOUTH AND THROAT WITH OR WITHOUT PRESENCE OF ULCERS (sore throat, sores in mouth, or a toothache) UNUSUAL RASH, SWELLING OR PAIN  UNUSUAL VAGINAL DISCHARGE OR ITCHING   Items with * indicate a potential emergency and should be followed up as soon as possible or go to the Emergency Department if any problems should occur.  Please show the CHEMOTHERAPY ALERT CARD or IMMUNOTHERAPY ALERT CARD at check-in to the  Emergency Department and triage nurse. Should you have questions after your visit or need to cancel or reschedule your appointment, please contact [facility]  [phone] and follow the prompts.  Office hours are 8:00 a.m. to 4:30 p.m. Monday - Friday. Please note that voicemails left after 4:00 p.m. may not be returned until the following business day.  We are closed weekends and major holidays. You have access to a nurse at all times for urgent questions. Please call the main number to the clinic [phone] and follow the prompts.  For any non-urgent questions, you may also contact your provider using MyChart. We now offer e-Visits for anyone 64 and older to request care online for non-urgent symptoms. For details visit [email or web address].   Also download the MyChart app! Go to the app store, search "MyChart", open the app, select [facility], and log in with your MyChart username and password.  Due to Covid, a mask is required upon entering the hospital/clinic. If you do not have a mask, one will be given to you upon arrival. For doctor visits, patients may have 1 support person aged 47 or older with them. For treatment visits, patients cannot have anyone with them due to current Covid guidelines and our immunocompromised population.

## 2021-05-31 ENCOUNTER — Telehealth: Payer: Self-pay | Admitting: *Deleted

## 2021-05-31 ENCOUNTER — Inpatient Hospital Stay: Payer: Medicare Other

## 2021-05-31 ENCOUNTER — Inpatient Hospital Stay: Payer: Medicare Other | Admitting: Family

## 2021-05-31 ENCOUNTER — Other Ambulatory Visit: Payer: Self-pay

## 2021-05-31 ENCOUNTER — Encounter: Payer: Self-pay | Admitting: Family

## 2021-05-31 ENCOUNTER — Inpatient Hospital Stay: Payer: Medicare Other | Attending: Hematology & Oncology

## 2021-05-31 VITALS — BP 139/80 | HR 78 | Temp 98.5°F | Resp 17 | Ht 64.0 in | Wt 238.8 lb

## 2021-05-31 DIAGNOSIS — C9001 Multiple myeloma in remission: Secondary | ICD-10-CM

## 2021-05-31 DIAGNOSIS — C9000 Multiple myeloma not having achieved remission: Secondary | ICD-10-CM

## 2021-05-31 DIAGNOSIS — Z5112 Encounter for antineoplastic immunotherapy: Secondary | ICD-10-CM | POA: Diagnosis present

## 2021-05-31 DIAGNOSIS — C50911 Malignant neoplasm of unspecified site of right female breast: Secondary | ICD-10-CM

## 2021-05-31 LAB — CMP (CANCER CENTER ONLY)
ALT: 17 U/L (ref 0–44)
AST: 15 U/L (ref 15–41)
Albumin: 4.1 g/dL (ref 3.5–5.0)
Alkaline Phosphatase: 53 U/L (ref 38–126)
Anion gap: 7 (ref 5–15)
BUN: 16 mg/dL (ref 8–23)
CO2: 28 mmol/L (ref 22–32)
Calcium: 9.4 mg/dL (ref 8.9–10.3)
Chloride: 102 mmol/L (ref 98–111)
Creatinine: 1.24 mg/dL — ABNORMAL HIGH (ref 0.44–1.00)
GFR, Estimated: 46 mL/min — ABNORMAL LOW (ref >60)
Glucose, Bld: 97 mg/dL (ref 70–99)
Potassium: 4 mmol/L (ref 3.5–5.1)
Sodium: 137 mmol/L (ref 135–145)
Total Bilirubin: 0.6 mg/dL (ref 0.3–1.2)
Total Protein: 6.5 g/dL (ref 6.5–8.1)

## 2021-05-31 LAB — CBC WITH DIFFERENTIAL (CANCER CENTER ONLY)
Abs Immature Granulocytes: 0.07 10*3/uL (ref 0.00–0.07)
Basophils Absolute: 0.2 10*3/uL — ABNORMAL HIGH (ref 0.0–0.1)
Basophils Relative: 3 %
Eosinophils Absolute: 0.2 10*3/uL (ref 0.0–0.5)
Eosinophils Relative: 3 %
HCT: 40.6 % (ref 36.0–46.0)
Hemoglobin: 13.3 g/dL (ref 12.0–15.0)
Immature Granulocytes: 1 %
Lymphocytes Relative: 49 %
Lymphs Abs: 2.7 10*3/uL (ref 0.7–4.0)
MCH: 32.8 pg (ref 26.0–34.0)
MCHC: 32.8 g/dL (ref 30.0–36.0)
MCV: 100 fL (ref 80.0–100.0)
Monocytes Absolute: 0.3 10*3/uL (ref 0.1–1.0)
Monocytes Relative: 6 %
Neutro Abs: 2.2 10*3/uL (ref 1.7–7.7)
Neutrophils Relative %: 38 %
Platelet Count: 210 10*3/uL (ref 150–400)
RBC: 4.06 MIL/uL (ref 3.87–5.11)
RDW: 13.4 % (ref 11.5–15.5)
WBC Count: 5.6 10*3/uL (ref 4.0–10.5)
nRBC: 0 % (ref 0.0–0.2)

## 2021-05-31 LAB — LACTATE DEHYDROGENASE: LDH: 154 U/L (ref 98–192)

## 2021-05-31 MED ORDER — DEXAMETHASONE 4 MG PO TABS
12.0000 mg | ORAL_TABLET | ORAL | Status: DC
  Administered 2021-05-31: 12 mg via ORAL

## 2021-05-31 MED ORDER — ANTICOAGULANT SODIUM CITRATE 4% (200MG/5ML) IV SOLN
5.0000 mL | Freq: Once | Status: AC
Start: 2021-05-31 — End: 2021-05-31
  Administered 2021-05-31: 5 mL
  Filled 2021-05-31: qty 5

## 2021-05-31 MED ORDER — BORTEZOMIB CHEMO SQ INJECTION 3.5 MG (2.5MG/ML)
1.3000 mg/m2 | Freq: Once | INTRAMUSCULAR | Status: AC
  Administered 2021-05-31: 2.75 mg via SUBCUTANEOUS
  Filled 2021-05-31: qty 1.1

## 2021-05-31 MED ORDER — PROCHLORPERAZINE MALEATE 10 MG PO TABS: 10.0000 mg | ORAL_TABLET | Freq: Once | ORAL | Status: DC

## 2021-05-31 MED ORDER — SODIUM CHLORIDE 0.9% FLUSH
10.0000 mL | INTRAVENOUS | Status: DC | PRN
  Administered 2021-05-31: 10 mL
  Filled 2021-05-31: qty 10

## 2021-05-31 NOTE — Progress Notes (Signed)
Hematology and Oncology Follow Up Visit  Marcia [name] [id number] 08-Jan-1951 70 y.o. 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 is here today for follow-up. She is doing well but does note some fatigue and occasional diarrhea.  She is taking her Pomalyst as prescribed.  No fever, chills, n/v, cough, rash, dizziness, SOB, chest pain, palpitations, abdominal pain or changes in bladder habits.  She had some itching on her face and ears with the blue masks. This resolved with using Aveeno and she is wearing a mask from home.  Mammogram in May was negative.  She pulled a muscle across the left breast which she states burns with certain movements but is healing.  No swelling, tenderness, numbness or tingling in her extremities at this time.  No falls or syncope to report.  She states that her appetite comes and goes. She is staying well hydrated. Her weight is stable at 238 lbs.   ECOG Performance Status: 1 - Symptomatic but completely ambulatory  Medications:  Allergies as of 05/31/2021       Reactions   Bactrim [sulfamethoxazole-trimethoprim] Other (See Comments)   ? Renal failure.   Heparin Other (See Comments)   HIT   Zofran [ondansetron] Shortness Of Breath   "felt like throat was closing"   Clarithromycin Diarrhea, Nausea And Vomiting   Latex Rash   Macrodantin [nitrofurantoin]  Rash        Medication List        Accurate as of May 31, 2021 11:47 AM. If you have any questions, ask your nurse or doctor.          acetaminophen 325 MG tablet Commonly known as: TYLENOL Take 650 mg by mouth daily as needed for moderate pain or headache.   amoxicillin 500 MG capsule Commonly known as: AMOXIL Prior to dentist   Eliquis 2.5 MG Tabs tablet Generic drug: apixaban TAKE 1 TABLET BY MOUTH TWICE A DAY   famciclovir 250 MG tablet Commonly known as: FAMVIR Take 1 tablet (250 mg total) by mouth daily.   feeding supplement Liqd Take 237 mLs by mouth 3 (three) times daily between meals. What changed: when to take this   folic acid 1 MG tablet Commonly known as: FOLVITE TAKE 2 TABLETS (2 MG TOTAL) BY MOUTH DAILY.   lidocaine-prilocaine cream Commonly known as: EMLA Apply 1 application topically as needed (local anesthetic).   LORazepam 0.5 MG tablet Commonly known as: ATIVAN Take 1 tablet (0.5 mg total) by mouth every 8 (eight) hours.   losartan 25 MG tablet Commonly known as: COZAAR Take 25 mg by mouth daily.   pomalidomide 3 MG capsule Commonly known as: POMALYST Take 1 capsule (3 mg total) by mouth daily. Take capsule for 21 days on and 7 days off.  [employer] Auth # [id number]   traMADol 50 MG tablet Commonly known as: ULTRAM Take 1 tablet (50 mg total) by mouth every  12 (twelve) hours as needed.        Allergies:  Allergies  Allergen Reactions   Bactrim [Sulfamethoxazole-Trimethoprim] Other (See Comments)    ? Renal failure.   Heparin Other (See Comments)    HIT   Zofran [Ondansetron] Shortness Of Breath    "felt like throat was closing"   Clarithromycin Diarrhea and Nausea And Vomiting   Latex Rash   Macrodantin [Nitrofurantoin] Rash    Past Medical History, Surgical history, Social history, and Family History were reviewed and updated.  Review of Systems: All other 10 point review of systems is negative.   Physical Exam:   height is 5\' 4"  (1.626 m) and weight is 238 lb 12.8 oz (108.3 kg). Her oral temperature is 98.5 F (36.9 C). Her blood pressure is 139/80 and her pulse is 78. Her respiration is 17 and oxygen saturation is 98%.   Wt Readings from Last 3 Encounters:  05/31/21 238 lb 12.8 oz (108.3 kg)  04/23/21 239 lb (108.4 kg)  02/22/21 235 lb (106.6 kg)    Ocular: Sclerae unicteric, pupils equal, round and reactive to light Ear-nose-throat: Oropharynx clear, dentition fair Lymphatic: No cervical or supraclavicular adenopathy Lungs no rales or rhonchi, good excursion bilaterally Heart regular rate and rhythm, no murmur appreciated Abd soft, nontender, positive bowel sounds MSK no focal spinal tenderness, no joint edema Neuro: non-focal, well-oriented, appropriate affect Breasts: Deferred   Lab Results  Component Value Date   WBC 5.6 05/31/2021   HGB 13.3 05/31/2021   HCT 40.6 05/31/2021   MCV 100.0 05/31/2021   PLT 210 05/31/2021   Lab Results  Component Value Date   FERRITIN 820 (H) 05/17/2018   IRON 272 (H) 05/17/2018   TIBC 295 05/17/2018   UIBC 23 05/17/2018   IRONPCTSAT 92 (H) 05/17/2018   Lab Results  Component Value Date   RETICCTPCT 1.3 03/27/2011   RBC 4.06 05/31/2021   RETICCTABS 56.4 03/27/2011   Lab Results  Component Value Date   KPAFRELGTCHN 192.6 (H) 04/23/2021   LAMBDASER 21.6 04/23/2021   KAPLAMBRATIO 8.92 (H) 04/23/2021   Lab Results  Component Value Date   IGGSERUM 812 04/23/2021   IGA 163 04/23/2021   IGMSERUM 104 04/23/2021   Lab Results  Component Value Date   TOTALPROTELP 6.1 04/23/2021   ALBUMINELP 3.4 04/23/2021   A1GS 0.1 04/23/2021   A2GS 0.7 04/23/2021   BETS 1.1 04/23/2021   BETA2SER 0.4 07/02/2015   GAMS 0.8 04/23/2021   MSPIKE Not Observed 04/23/2021   SPEI Comment 04/23/2021     Chemistry      Component Value Date/Time   NA 137 05/31/2021 1019   NA 141 07/06/2017 0816   NA 141 01/05/2017 0956   K 4.0 05/31/2021 1019   K 3.6  07/06/2017 0816   K 4.3 01/05/2017 0956   CL 102 05/31/2021 1019   CL 101 07/06/2017 0816   CO2 28 05/31/2021 1019   CO2 28 07/06/2017 0816   CO2 23 01/05/2017 0956   BUN 16 05/31/2021 1019   BUN 28 (H) 07/06/2017 0816   BUN 22.4 01/05/2017 0956   CREATININE 1.24 (H) 05/31/2021 1019   CREATININE 1.6 (H) 07/06/2017 0816   CREATININE 1.4 (H) 01/05/2017 0956      Component Value Date/Time   CALCIUM 9.4 05/31/2021 1019   CALCIUM 9.1 07/06/2017 0816   CALCIUM 9.3 01/05/2017 0956   ALKPHOS 53 05/31/2021 1019   ALKPHOS 58 07/06/2017 0816   ALKPHOS 52 01/05/2017 0956  AST 15 05/31/2021 1019   AST 14 01/05/2017 0956   ALT 17 05/31/2021 1019   ALT 58 (H) 07/06/2017 0816   ALT 13 01/05/2017 0956   BILITOT 0.6 05/31/2021 1019   BILITOT 0.56 01/05/2017 0956       Impression and Plan: Ms. Hennings is a very pleasant 70 yo caucasian female with now recurrent kappa light chain myeloma with stem cell transplant. She is tolerating PVd nicely so far and will proceed with Velcade injection today as planned. She will continue her 3 week on, 1 week off schedule.  Protein studies drawn today and results are pending.  She also has history of locally advanced infiltratin ductal carcinoma of the right breast, ER (-)/PR(-)/HER-2(+), Ki67 of 75%. She completed neoadjuvant chemo with 4 cycles of carbo/Taxol as well as radiation therapy. So far, she continues to do well and there has been no evidence of recurrence. Follow-up in 4 weeks.  She can contact our office with any questions or concerns.   Laverna Peace, NP 7/8/202211:47 AM

## 2021-05-31 NOTE — Telephone Encounter (Signed)
Per 05/31/21 los gave patient upcoming appointment - patient confirmed

## 2021-05-31 NOTE — Patient Instructions (Signed)
[facility] [place]  Discharge Instructions: Thank you for choosing [facility] to provide your oncology and hematology care.   If you have a lab appointment with the [facility], please go directly to the [facility] and check in at the registration area.  Wear comfortable clothing and clothing appropriate for easy access to any Portacath or PICC line.   We strive to give you quality time with your provider. You may need to reschedule your appointment if you arrive late (15 or more minutes).  Arriving late affects you and other patients whose appointments are after yours.  Also, if you miss three or more appointments without notifying the office, you may be dismissed from the clinic at the provider's discretion.      For prescription refill requests, have your pharmacy contact our office and allow 72 hours for refills to be completed.    Today you received the following chemotherapy and/or immunotherapy agents Velcade      To help prevent nausea and vomiting after your treatment, we encourage you to take your nausea medication as directed.  BELOW ARE SYMPTOMS THAT SHOULD BE REPORTED IMMEDIATELY: *FEVER GREATER THAN 100.4 F (38 C) OR HIGHER *CHILLS OR SWEATING *NAUSEA AND VOMITING THAT IS NOT CONTROLLED WITH YOUR NAUSEA MEDICATION *UNUSUAL SHORTNESS OF BREATH *UNUSUAL BRUISING OR BLEEDING *URINARY PROBLEMS (pain or burning when urinating, or frequent urination) *BOWEL PROBLEMS (unusual diarrhea, constipation, pain near the anus) TENDERNESS IN MOUTH AND THROAT WITH OR WITHOUT PRESENCE OF ULCERS (sore throat, sores in mouth, or a toothache) UNUSUAL RASH, SWELLING OR PAIN  UNUSUAL VAGINAL DISCHARGE OR ITCHING   Items with * indicate a potential emergency and should be followed up as soon as possible or go to the Emergency Department if any problems should occur.  Please show the CHEMOTHERAPY ALERT CARD or IMMUNOTHERAPY ALERT CARD at check-in to the  Emergency Department and triage nurse. Should you have questions after your visit or need to cancel or reschedule your appointment, please contact [facility]  [phone] and follow the prompts.  Office hours are 8:00 a.m. to 4:30 p.m. Monday - Friday. Please note that voicemails left after 4:00 p.m. may not be returned until the following business day.  We are closed weekends and major holidays. You have access to a nurse at all times for urgent questions. Please call the main number to the clinic [phone] and follow the prompts.  For any non-urgent questions, you may also contact your provider using MyChart. We now offer e-Visits for anyone 64 and older to request care online for non-urgent symptoms. For details visit [email or web address].   Also download the MyChart app! Go to the app store, search "MyChart", open the app, select [facility], and log in with your MyChart username and password.  Due to Covid, a mask is required upon entering the hospital/clinic. If you do not have a mask, one will be given to you upon arrival. For doctor visits, patients may have 1 support person aged 47 or older with them. For treatment visits, patients cannot have anyone with them due to current Covid guidelines and our immunocompromised population.

## 2021-05-31 NOTE — Patient Instructions (Signed)
Implanted Port Home Guide An implanted port is a device that is placed under the skin. It is usually placed in the chest. The device can be used to give IV medicine, to take blood, or for dialysis. You may have an implanted port if: You need IV medicine that would be irritating to the small veins in your hands or arms. You need IV medicines, such as antibiotics, for a long period of time. You need IV nutrition for a long period of time. You need dialysis. When you have a port, your health care provider can choose to use the port instead of veins in your arms for these procedures. You may have fewer limitations when using a port than you would if you used other types of long-term IVs, and you will likely be able to return to normal activities afteryour incision heals. An implanted port has two main parts: Reservoir. The reservoir is the part where a needle is inserted to give medicines or draw blood. The reservoir is round. After it is placed, it appears as a small, raised area under your skin. Catheter. The catheter is a thin, flexible tube that connects the reservoir to a vein. Medicine that is inserted into the reservoir goes into the catheter and then into the vein. How is my port accessed? To access your port: A numbing cream may be placed on the skin over the port site. Your health care provider will put on a mask and sterile gloves. The skin over your port will be cleaned carefully with a germ-killing soap and allowed to dry. Your health care provider will gently pinch the port and insert a needle into it. Your health care provider will check for a blood return to make sure the port is in the vein and is not clogged. If your port needs to remain accessed to get medicine continuously (constant infusion), your health care provider will place a clear bandage (dressing) over the needle site. The dressing and needle will need to be changed every week, or as told by your health care provider. What  is flushing? Flushing helps keep the port from getting clogged. Follow instructions from your health care provider about how and when to flush the port. Ports are usually flushed with saline solution or a medicine called heparin. The need for flushing will depend on how the port is used: If the port is only used from time to time to give medicines or draw blood, the port may need to be flushed: Before and after medicines have been given. Before and after blood has been drawn. As part of routine maintenance. Flushing may be recommended every 4-6 weeks. If a constant infusion is running, the port may not need to be flushed. Throw away any syringes in a disposal container that is meant for sharp items (sharps container). You can buy a sharps container from a pharmacy, or you can make one by using an empty hard plastic bottle with a cover. How long will my port stay implanted? The port can stay in for as long as your health care provider thinks it is needed. When it is time for the port to come out, a surgery will be done to remove it. The surgery will be similar to the procedure that was done to putthe port in. Follow these instructions at home:  Flush your port as told by your health care provider. If you need an infusion over several days, follow instructions from your health care provider about how to take   care of your port site. Make sure you: Wash your hands with soap and water before you change your dressing. If soap and water are not available, use alcohol-based hand sanitizer. Change your dressing as told by your health care provider. Place any used dressings or infusion bags into a plastic bag. Throw that bag in the trash. Keep the dressing that covers the needle clean and dry. Do not get it wet. Do not use scissors or sharp objects near the tube. Keep the tube clamped, unless it is being used. Check your port site every day for signs of infection. Check for: Redness, swelling, or  pain. Fluid or blood. Pus or a bad smell. Protect the skin around the port site. Avoid wearing bra straps that rub or irritate the site. Protect the skin around your port from seat belts. Place a soft pad over your chest if needed. Bathe or shower as told by your health care provider. The site may get wet as long as you are not actively receiving an infusion. Return to your normal activities as told by your health care provider. Ask your health care provider what activities are safe for you. Carry a medical alert card or wear a medical alert bracelet at all times. This will let health care providers know that you have an implanted port in case of an emergency. Get help right away if: You have redness, swelling, or pain at the port site. You have fluid or blood coming from your port site. You have pus or a bad smell coming from the port site. You have a fever. Summary Implanted ports are usually placed in the chest for long-term IV access. Follow instructions from your health care provider about flushing the port and changing bandages (dressings). Take care of the area around your port by avoiding clothing that puts pressure on the area, and by watching for signs of infection. Protect the skin around your port from seat belts. Place a soft pad over your chest if needed. Get help right away if you have a fever or you have redness, swelling, pain, drainage, or a bad smell at the port site. This information is not intended to replace advice given to you by your health care provider. Make sure you discuss any questions you have with your healthcare provider. Document Revised: 03/26/2020 Document Reviewed: 03/26/2020 Elsevier Patient Education  2022 Elsevier Inc.  

## 2021-06-01 LAB — IGG, IGA, IGM
IgA: 145 mg/dL (ref 64–422)
IgG (Immunoglobin G), Serum: 714 mg/dL (ref 586–1602)
IgM (Immunoglobulin M), Srm: 105 mg/dL (ref 26–217)

## 2021-06-03 LAB — PROTEIN ELECTROPHORESIS, SERUM, WITH REFLEX
A/G Ratio: 1.4 (ref 0.7–1.7)
Albumin ELP: 3.7 g/dL (ref 2.9–4.4)
Alpha-1-Globulin: 0.2 g/dL (ref 0.0–0.4)
Alpha-2-Globulin: 0.7 g/dL (ref 0.4–1.0)
Beta Globulin: 1.1 g/dL (ref 0.7–1.3)
Gamma Globulin: 0.7 g/dL (ref 0.4–1.8)
Globulin, Total: 2.7 g/dL (ref 2.2–3.9)
Total Protein ELP: 6.4 g/dL (ref 6.0–8.5)

## 2021-06-03 LAB — KAPPA/LAMBDA LIGHT CHAINS
Kappa free light chain: 107.7 mg/L — ABNORMAL HIGH (ref 3.3–19.4)
Kappa, lambda light chain ratio: 3.97 — ABNORMAL HIGH (ref 0.26–1.65)
Lambda free light chains: 27.1 mg/L — ABNORMAL HIGH (ref 5.7–26.3)

## 2021-06-10 ENCOUNTER — Other Ambulatory Visit: Payer: Self-pay | Admitting: *Deleted

## 2021-06-10 MED ORDER — POMALIDOMIDE 3 MG PO CAPS: 3.0000 mg | ORAL_CAPSULE | Freq: Every day | ORAL | 0 refills | Status: DC

## 2021-06-28 ENCOUNTER — Inpatient Hospital Stay: Payer: Medicare Other | Admitting: Family

## 2021-06-28 ENCOUNTER — Inpatient Hospital Stay: Payer: Medicare Other

## 2021-06-28 ENCOUNTER — Other Ambulatory Visit: Payer: Self-pay

## 2021-06-28 ENCOUNTER — Inpatient Hospital Stay: Payer: Medicare Other | Attending: Hematology & Oncology

## 2021-06-28 ENCOUNTER — Encounter: Payer: Self-pay | Admitting: Family

## 2021-06-28 VITALS — BP 149/81 | HR 82 | Temp 98.4°F | Resp 18 | Wt 237.4 lb

## 2021-06-28 DIAGNOSIS — C9000 Multiple myeloma not having achieved remission: Secondary | ICD-10-CM

## 2021-06-28 DIAGNOSIS — C50911 Malignant neoplasm of unspecified site of right female breast: Secondary | ICD-10-CM

## 2021-06-28 DIAGNOSIS — C9001 Multiple myeloma in remission: Secondary | ICD-10-CM

## 2021-06-28 DIAGNOSIS — C9002 Multiple myeloma in relapse: Secondary | ICD-10-CM | POA: Insufficient documentation

## 2021-06-28 DIAGNOSIS — I2601 Septic pulmonary embolism with acute cor pulmonale: Secondary | ICD-10-CM | POA: Diagnosis not present

## 2021-06-28 DIAGNOSIS — Z9484 Stem cells transplant status: Secondary | ICD-10-CM | POA: Diagnosis not present

## 2021-06-28 DIAGNOSIS — I824Z3 Acute embolism and thrombosis of unspecified deep veins of distal lower extremity, bilateral: Secondary | ICD-10-CM

## 2021-06-28 DIAGNOSIS — Z5111 Encounter for antineoplastic chemotherapy: Secondary | ICD-10-CM | POA: Diagnosis present

## 2021-06-28 LAB — CBC WITH DIFFERENTIAL (CANCER CENTER ONLY)
Abs Immature Granulocytes: 0.11 10*3/uL — ABNORMAL HIGH (ref 0.00–0.07)
Basophils Absolute: 0.1 10*3/uL (ref 0.0–0.1)
Basophils Relative: 2 %
Eosinophils Absolute: 0.3 10*3/uL (ref 0.0–0.5)
Eosinophils Relative: 6 %
HCT: 41.8 % (ref 36.0–46.0)
Hemoglobin: 13.9 g/dL (ref 12.0–15.0)
Immature Granulocytes: 2 %
Lymphocytes Relative: 48 %
Lymphs Abs: 2.5 10*3/uL (ref 0.7–4.0)
MCH: 32.6 pg (ref 26.0–34.0)
MCHC: 33.3 g/dL (ref 30.0–36.0)
MCV: 97.9 fL (ref 80.0–100.0)
Monocytes Absolute: 0.4 10*3/uL (ref 0.1–1.0)
Monocytes Relative: 8 %
Neutro Abs: 1.8 10*3/uL (ref 1.7–7.7)
Neutrophils Relative %: 34 %
Platelet Count: 201 10*3/uL (ref 150–400)
RBC: 4.27 MIL/uL (ref 3.87–5.11)
RDW: 13.7 % (ref 11.5–15.5)
WBC Count: 5.3 10*3/uL (ref 4.0–10.5)
nRBC: 0 % (ref 0.0–0.2)

## 2021-06-28 LAB — CMP (CANCER CENTER ONLY)
ALT: 12 U/L (ref 0–44)
AST: 13 U/L — ABNORMAL LOW (ref 15–41)
Albumin: 3.9 g/dL (ref 3.5–5.0)
Alkaline Phosphatase: 59 U/L (ref 38–126)
Anion gap: 9 (ref 5–15)
BUN: 18 mg/dL (ref 8–23)
CO2: 29 mmol/L (ref 22–32)
Calcium: 9.4 mg/dL (ref 8.9–10.3)
Chloride: 100 mmol/L (ref 98–111)
Creatinine: 1.28 mg/dL — ABNORMAL HIGH (ref 0.44–1.00)
GFR, Estimated: 45 mL/min — ABNORMAL LOW (ref >60)
Glucose, Bld: 95 mg/dL (ref 70–99)
Potassium: 4.2 mmol/L (ref 3.5–5.1)
Sodium: 138 mmol/L (ref 135–145)
Total Bilirubin: 0.6 mg/dL (ref 0.3–1.2)
Total Protein: 6.6 g/dL (ref 6.5–8.1)

## 2021-06-28 LAB — LACTATE DEHYDROGENASE: LDH: 125 U/L (ref 98–192)

## 2021-06-28 MED ORDER — SODIUM CHLORIDE 0.9% FLUSH
10.0000 mL | INTRAVENOUS | Status: DC | PRN
  Administered 2021-06-28: 10 mL
  Filled 2021-06-28: qty 10

## 2021-06-28 MED ORDER — BORTEZOMIB CHEMO SQ INJECTION 3.5 MG (2.5MG/ML)
1.3000 mg/m2 | Freq: Once | INTRAMUSCULAR | Status: AC
  Administered 2021-06-28: 2.75 mg via SUBCUTANEOUS
  Filled 2021-06-28: qty 1.1

## 2021-06-28 MED ORDER — ANTICOAGULANT SODIUM CITRATE LOCK FLUSH 4% (120 MG/3ML) IV SOLN
6.0000 mL | PREFILLED_SYRINGE | Freq: Once | INTRAVENOUS | Status: AC
  Administered 2021-06-28: 6 mL
  Filled 2021-06-28: qty 6

## 2021-06-28 MED ORDER — DEXAMETHASONE 4 MG PO TABS
12.0000 mg | ORAL_TABLET | ORAL | Status: DC
  Administered 2021-06-28: 12 mg via ORAL

## 2021-06-28 MED ORDER — DEXAMETHASONE 4 MG PO TABS
ORAL_TABLET | ORAL | Status: AC
  Filled 2021-06-28: qty 3

## 2021-06-28 MED ORDER — ANTICOAGULANT SODIUM CITRATE 4% (200MG/5ML) IV SOLN: 5.0000 mL | Freq: Once | Status: DC

## 2021-06-28 MED ORDER — PROCHLORPERAZINE MALEATE 10 MG PO TABS: 10.0000 mg | ORAL_TABLET | Freq: Once | ORAL | Status: DC

## 2021-06-28 NOTE — Patient Instructions (Signed)
Implanted Port Home Guide An implanted port is a device that is placed under the skin. It is usually placed in the chest. The device can be used to give IV medicine, to take blood, or for dialysis. You may have an implanted port if: You need IV medicine that would be irritating to the small veins in your hands or arms. You need IV medicines, such as antibiotics, for a long period of time. You need IV nutrition for a long period of time. You need dialysis. When you have a port, your health care provider can choose to use the port instead of veins in your arms for these procedures. You may have fewer limitations when using a port than you would if you used other types of long-term IVs, and you will likely be able to return to normal activities afteryour incision heals. An implanted port has two main parts: Reservoir. The reservoir is the part where a needle is inserted to give medicines or draw blood. The reservoir is round. After it is placed, it appears as a small, raised area under your skin. Catheter. The catheter is a thin, flexible tube that connects the reservoir to a vein. Medicine that is inserted into the reservoir goes into the catheter and then into the vein. How is my port accessed? To access your port: A numbing cream may be placed on the skin over the port site. Your health care provider will put on a mask and sterile gloves. The skin over your port will be cleaned carefully with a germ-killing soap and allowed to dry. Your health care provider will gently pinch the port and insert a needle into it. Your health care provider will check for a blood return to make sure the port is in the vein and is not clogged. If your port needs to remain accessed to get medicine continuously (constant infusion), your health care provider will place a clear bandage (dressing) over the needle site. The dressing and needle will need to be changed every week, or as told by your health care provider. What  is flushing? Flushing helps keep the port from getting clogged. Follow instructions from your health care provider about how and when to flush the port. Ports are usually flushed with saline solution or a medicine called heparin. The need for flushing will depend on how the port is used: If the port is only used from time to time to give medicines or draw blood, the port may need to be flushed: Before and after medicines have been given. Before and after blood has been drawn. As part of routine maintenance. Flushing may be recommended every 4-6 weeks. If a constant infusion is running, the port may not need to be flushed. Throw away any syringes in a disposal container that is meant for sharp items (sharps container). You can buy a sharps container from a pharmacy, or you can make one by using an empty hard plastic bottle with a cover. How long will my port stay implanted? The port can stay in for as long as your health care provider thinks it is needed. When it is time for the port to come out, a surgery will be done to remove it. The surgery will be similar to the procedure that was done to putthe port in. Follow these instructions at home:  Flush your port as told by your health care provider. If you need an infusion over several days, follow instructions from your health care provider about how to take   care of your port site. Make sure you: Wash your hands with soap and water before you change your dressing. If soap and water are not available, use alcohol-based hand sanitizer. Change your dressing as told by your health care provider. Place any used dressings or infusion bags into a plastic bag. Throw that bag in the trash. Keep the dressing that covers the needle clean and dry. Do not get it wet. Do not use scissors or sharp objects near the tube. Keep the tube clamped, unless it is being used. Check your port site every day for signs of infection. Check for: Redness, swelling, or  pain. Fluid or blood. Pus or a bad smell. Protect the skin around the port site. Avoid wearing bra straps that rub or irritate the site. Protect the skin around your port from seat belts. Place a soft pad over your chest if needed. Bathe or shower as told by your health care provider. The site may get wet as long as you are not actively receiving an infusion. Return to your normal activities as told by your health care provider. Ask your health care provider what activities are safe for you. Carry a medical alert card or wear a medical alert bracelet at all times. This will let health care providers know that you have an implanted port in case of an emergency. Get help right away if: You have redness, swelling, or pain at the port site. You have fluid or blood coming from your port site. You have pus or a bad smell coming from the port site. You have a fever. Summary Implanted ports are usually placed in the chest for long-term IV access. Follow instructions from your health care provider about flushing the port and changing bandages (dressings). Take care of the area around your port by avoiding clothing that puts pressure on the area, and by watching for signs of infection. Protect the skin around your port from seat belts. Place a soft pad over your chest if needed. Get help right away if you have a fever or you have redness, swelling, pain, drainage, or a bad smell at the port site. This information is not intended to replace advice given to you by your health care provider. Make sure you discuss any questions you have with your healthcare provider. Document Revised: 03/26/2020 Document Reviewed: 03/26/2020 Elsevier Patient Education  2022 Elsevier Inc.  

## 2021-06-28 NOTE — Progress Notes (Signed)
Hematology and Oncology Follow Up Visit  Marcia [name] [id number] 1951/06/04 70 y.o. 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 She is doing well and has no complaints at this time.  She notes fatigue, moodiness and constipation or diarrhea for the first 4 days of each new cycle. She states that this is tolerable.  In July, M-spike was not detected, IgG level was 714 mg/dL and kappa light chains 10.77 mg/dL.  No fever, chills, n/v, cough, rash, dizziness, chest pain, palpitations, abdominal pain or changes in bladder habits.  She has mild SOB with over exertion and when bending over. She takes a break to rest when needed.  No swelling, tenderness, numbness or tingling in her extremities.  No falls or syncope to report. She is ambulating with a cane for added support.  She has maintained a good appetite and is staying well hydrated. Her weight is stable at 237 lbs.   ECOG Performance Status: 1 - Symptomatic but completely ambulatory  Medications:  Allergies as of 06/28/2021       Reactions   Bactrim [sulfamethoxazole-trimethoprim] Other (See Comments)   ? Renal failure.   Heparin Other (See Comments)   HIT   Zofran [ondansetron] Shortness Of Breath   "felt like throat was closing"   Clarithromycin Diarrhea, Nausea And Vomiting   Latex Rash    Macrodantin [nitrofurantoin] Rash        Medication List        Accurate as of June 28, 2021 10:04 AM. If you have any questions, ask your nurse or doctor.          acetaminophen 325 MG tablet Commonly known as: TYLENOL Take 650 mg by mouth daily as needed for moderate pain or headache.   amoxicillin 500 MG capsule Commonly known as: AMOXIL Prior to dentist   Eliquis 2.5 MG Tabs tablet Generic drug: apixaban TAKE 1 TABLET BY MOUTH TWICE A DAY   famciclovir 250 MG tablet Commonly known as: FAMVIR Take 1 tablet (250 mg total) by mouth daily.   feeding supplement Liqd Take 237 mLs by mouth 3 (three) times daily between meals. What changed: when to take this   folic acid 1 MG tablet Commonly known as: FOLVITE TAKE 2 TABLETS (2 MG TOTAL) BY MOUTH DAILY.   lidocaine-prilocaine cream Commonly known as: EMLA Apply 1 application topically as needed (local anesthetic).   LORazepam 0.5 MG tablet Commonly known as: ATIVAN Take 1 tablet (0.5 mg total) by mouth every 8 (eight) hours.   losartan 25 MG tablet Commonly known as: COZAAR Take 25 mg by mouth daily.   pomalidomide 3 MG capsule Commonly known as: POMALYST Take 1 capsule (3 mg total) by mouth daily. Take capsule for 21 days on and 7 days off.  [employer] Auth # [id number]   traMADol 50 MG tablet Commonly known as: ULTRAM Take 1  tablet (50 mg total) by mouth every 12 (twelve) hours as needed.        Allergies:  Allergies  Allergen Reactions   Bactrim [Sulfamethoxazole-Trimethoprim] Other (See Comments)    ? Renal failure.   Heparin Other (See Comments)    HIT   Zofran [Ondansetron] Shortness Of Breath    "felt like throat was closing"   Clarithromycin Diarrhea and Nausea And Vomiting   Latex Rash   Macrodantin [Nitrofurantoin] Rash    Past Medical History, Surgical history, Social history, and Family History were reviewed and updated.  Review of Systems: All other 10 point review of systems is  negative.   Physical Exam:  vitals were not taken for this visit.   Wt Readings from Last 3 Encounters:  05/31/21 238 lb 12.8 oz (108.3 kg)  04/23/21 239 lb (108.4 kg)  02/22/21 235 lb (106.6 kg)    Ocular: Sclerae unicteric, pupils equal, round and reactive to light Ear-nose-throat: Oropharynx clear, dentition fair Lymphatic: No cervical or supraclavicular adenopathy Lungs no rales or rhonchi, good excursion bilaterally Heart regular rate and rhythm, no murmur appreciated Abd soft, nontender, positive bowel sounds MSK no focal spinal tenderness, no joint edema Neuro: non-focal, well-oriented, appropriate affect Breasts: Deferred   Lab Results  Component Value Date   WBC 5.3 06/28/2021   HGB 13.9 06/28/2021   HCT 41.8 06/28/2021   MCV 97.9 06/28/2021   PLT 201 06/28/2021   Lab Results  Component Value Date   FERRITIN 820 (H) 05/17/2018   IRON 272 (H) 05/17/2018   TIBC 295 05/17/2018   UIBC 23 05/17/2018   IRONPCTSAT 92 (H) 05/17/2018   Lab Results  Component Value Date   RETICCTPCT 1.3 03/27/2011   RBC 4.27 06/28/2021   RETICCTABS 56.4 03/27/2011   Lab Results  Component Value Date   KPAFRELGTCHN 107.7 (H) 05/31/2021   LAMBDASER 27.1 (H) 05/31/2021   KAPLAMBRATIO 3.97 (H) 05/31/2021   Lab Results  Component Value Date   IGGSERUM 714 05/31/2021   IGA 145 05/31/2021   IGMSERUM 105 05/31/2021   Lab Results  Component Value Date   TOTALPROTELP 6.4 05/31/2021   ALBUMINELP 3.7 05/31/2021   A1GS 0.2 05/31/2021   A2GS 0.7 05/31/2021   BETS 1.1 05/31/2021   BETA2SER 0.4 07/02/2015   GAMS 0.7 05/31/2021   MSPIKE Not Observed 05/31/2021   SPEI Comment 04/23/2021     Chemistry      Component Value Date/Time   NA 137 05/31/2021 1019   NA 141 07/06/2017 0816   NA 141 01/05/2017 0956   K 4.0 05/31/2021 1019   K 3.6 07/06/2017 0816   K 4.3 01/05/2017 0956   CL 102 05/31/2021 1019   CL 101 07/06/2017 0816   CO2 28 05/31/2021 1019   CO2 28 07/06/2017 0816    CO2 23 01/05/2017 0956   BUN 16 05/31/2021 1019   BUN 28 (H) 07/06/2017 0816   BUN 22.4 01/05/2017 0956   CREATININE 1.24 (H) 05/31/2021 1019   CREATININE 1.6 (H) 07/06/2017 0816   CREATININE 1.4 (H) 01/05/2017 0956      Component Value Date/Time   CALCIUM 9.4 05/31/2021 1019   CALCIUM 9.1 07/06/2017 0816   CALCIUM 9.3 01/05/2017 0956   ALKPHOS 53 05/31/2021 1019   ALKPHOS 58 07/06/2017 0816   ALKPHOS 52 01/05/2017 0956   AST 15 05/31/2021 1019   AST 14 01/05/2017 0956   ALT 17 05/31/2021 1019   ALT 58 (H) 07/06/2017 0816   ALT 13  01/05/2017 0956   BILITOT 0.6 05/31/2021 1019   BILITOT 0.56 01/05/2017 0956       Impression and Plan: Ms. Bryand is a very pleasant 70 yo caucasian female with now recurrent kappa light chain myeloma with stem cell transplant. She is tolerating PVd nicely so far. She is taking her Pomalyst as prescribed (21 days on, 7 days off).  We will proceed with Velcade injection today as planned.  She will continue her 3 week on, 1 week off schedule. Protein studies drawn today and results are pending.  She also has history of locally advanced infiltratin ductal carcinoma of the right breast, ER (-)/PR(-)/HER-2(+), Ki67 of 75%. She completed neoadjuvant chemo with 4 cycles of carbo/Taxol as well as radiation therapy. So far, she continues to do well and there has been no evidence of recurrence. Follow-up in 4 weeks.  She can contact our office with any questions or concerns.   Laverna Peace, NP 8/5/202210:04 AM

## 2021-06-28 NOTE — Patient Instructions (Signed)
[facility]  Discharge Instructions: Thank you for choosing [facility] to provide your oncology and hematology care.   If you have a lab appointment with the [facility], please go directly to the [facility] and check in at the registration area.  Wear comfortable clothing and clothing appropriate for easy access to any Portacath or PICC line.   We strive to give you quality time with your provider. You may need to reschedule your appointment if you arrive late (15 or more minutes).  Arriving late affects you and other patients whose appointments are after yours.  Also, if you miss three or more appointments without notifying the office, you may be dismissed from the clinic at the provider's discretion.      For prescription refill requests, have your pharmacy contact our office and allow 72 hours for refills to be completed.    Today you received the following chemotherapy and/or immunotherapy agents Velcade      To help prevent nausea and vomiting after your treatment, we encourage you to take your nausea medication as directed.  BELOW ARE SYMPTOMS THAT SHOULD BE REPORTED IMMEDIATELY: *FEVER GREATER THAN 100.4 F (38 C) OR HIGHER *CHILLS OR SWEATING *NAUSEA AND VOMITING THAT IS NOT CONTROLLED WITH YOUR NAUSEA MEDICATION *UNUSUAL SHORTNESS OF BREATH *UNUSUAL BRUISING OR BLEEDING *URINARY PROBLEMS (pain or burning when urinating, or frequent urination) *BOWEL PROBLEMS (unusual diarrhea, constipation, pain near the anus) TENDERNESS IN MOUTH AND THROAT WITH OR WITHOUT PRESENCE OF ULCERS (sore throat, sores in mouth, or a toothache) UNUSUAL RASH, SWELLING OR PAIN  UNUSUAL VAGINAL DISCHARGE OR ITCHING   Items with * indicate a potential emergency and should be followed up as soon as possible or go to the Emergency Department if any problems should occur.  Please show the CHEMOTHERAPY ALERT CARD or IMMUNOTHERAPY ALERT CARD at check-in to the  Emergency Department and triage nurse. Should you have questions after your visit or need to cancel or reschedule your appointment, please contact [facility]  [phone] and follow the prompts.  Office hours are 8:00 a.m. to 4:30 p.m. Monday - Friday. Please note that voicemails left after 4:00 p.m. may not be returned until the following business day.  We are closed weekends and major holidays. You have access to a nurse at all times for urgent questions. Please call the main number to the clinic [phone] and follow the prompts.  For any non-urgent questions, you may also contact your provider using MyChart. We now offer e-Visits for anyone 72 and older to request care online for non-urgent symptoms. For details visit [email or web address].   Also download the MyChart app! Go to the app store, search "MyChart", open the app, select [facility], and log in with your MyChart username and password.  Due to Covid, a mask is required upon entering the hospital/clinic. If you do not have a mask, one will be given to you upon arrival. For doctor visits, patients may have 1 support person aged 72 or older with them. For treatment visits, patients cannot have anyone with them due to current Covid guidelines and our immunocompromised population.

## 2021-06-29 LAB — IGG, IGA, IGM
IgA: 200 mg/dL (ref 64–422)
IgG (Immunoglobin G), Serum: 810 mg/dL (ref 586–1602)
IgM (Immunoglobulin M), Srm: 98 mg/dL (ref 26–217)

## 2021-07-01 LAB — PROTEIN ELECTROPHORESIS, SERUM
A/G Ratio: 1.3 (ref 0.7–1.7)
Albumin ELP: 3.6 g/dL (ref 2.9–4.4)
Alpha-1-Globulin: 0.1 g/dL (ref 0.0–0.4)
Alpha-2-Globulin: 0.8 g/dL (ref 0.4–1.0)
Beta Globulin: 1.1 g/dL (ref 0.7–1.3)
Gamma Globulin: 0.8 g/dL (ref 0.4–1.8)
Globulin, Total: 2.8 g/dL (ref 2.2–3.9)
Total Protein ELP: 6.4 g/dL (ref 6.0–8.5)

## 2021-07-01 LAB — KAPPA/LAMBDA LIGHT CHAINS
Kappa free light chain: 101.1 mg/L — ABNORMAL HIGH (ref 3.3–19.4)
Kappa, lambda light chain ratio: 2.6 — ABNORMAL HIGH (ref 0.26–1.65)
Lambda free light chains: 38.9 mg/L — ABNORMAL HIGH (ref 5.7–26.3)

## 2021-07-05 ENCOUNTER — Other Ambulatory Visit: Payer: Self-pay | Admitting: Hematology & Oncology

## 2021-07-05 DIAGNOSIS — D649 Anemia, unspecified: Secondary | ICD-10-CM

## 2021-07-10 ENCOUNTER — Other Ambulatory Visit: Payer: Self-pay | Admitting: *Deleted

## 2021-07-10 MED ORDER — POMALIDOMIDE 3 MG PO CAPS: 3.0000 mg | ORAL_CAPSULE | Freq: Every day | ORAL | 0 refills | Status: DC

## 2021-07-16 ENCOUNTER — Encounter: Payer: Self-pay | Admitting: Family

## 2021-07-16 ENCOUNTER — Telehealth: Payer: Self-pay | Admitting: *Deleted

## 2021-07-16 NOTE — Telephone Encounter (Signed)
Call placed to patient immediately after seeing pt.'s MyChart message about experiencing chest pain and being SOB.  No answer.

## 2021-07-16 NOTE — Telephone Encounter (Signed)
Second call placed to patient.  Pt states that her chest pain is now better, but that she remains short of breath.  Pt states that she has been SOB times one week.  Pt instructed to go to the closest ER now per order of S. Hancock NP.  Pt states that she will go to the ER now.

## 2021-07-28 ENCOUNTER — Other Ambulatory Visit: Payer: Self-pay | Admitting: Hematology & Oncology

## 2021-07-30 ENCOUNTER — Ambulatory Visit: Payer: Medicare Other | Admitting: Family

## 2021-07-30 ENCOUNTER — Other Ambulatory Visit: Payer: Medicare Other

## 2021-07-30 ENCOUNTER — Ambulatory Visit: Payer: Medicare Other

## 2021-07-30 ENCOUNTER — Encounter: Payer: Self-pay | Admitting: Hematology & Oncology

## 2021-08-07 ENCOUNTER — Encounter: Payer: Self-pay | Admitting: Family

## 2021-08-07 NOTE — Telephone Encounter (Signed)
Called patient back, she was very emotional but denies any SI or HI at this time, states she is okay at home with her husband and he just had her take her ativan to help her calm down but doesn't feel she needs to go to the ED or be seen currently. States her breathing is better now that she has stopped the pomalyst and is just having deep thoughts and depression. Patient still advised to go to ED if it worsens tonight or feels she might harm herself or others. Will call patient in the morning to check on her, and MD notified and agreed to holding pomalyst.

## 2021-08-08 ENCOUNTER — Telehealth: Payer: Self-pay | Admitting: *Deleted

## 2021-08-08 NOTE — Telephone Encounter (Signed)
Call placed to check on patient.  Pt states that she "feels so much better today, has had no further suicidal thoughts and breathing is much better since Pomalyst stopped."  Pt states that her last dose of Pomalyst was on Monday.  Pt states that her husband will be with her all day today and is not leaving her side.  Pt is appreciative of call and voices an understanding to call with any further difficulties.

## 2021-08-08 NOTE — Telephone Encounter (Signed)
Message received from [facility] requesting a refill for Pomalyst.  Call placed back to [facility] and [facility] notified that Pomalyst is on hold at this time.

## 2021-08-28 ENCOUNTER — Inpatient Hospital Stay: Payer: Medicare Other | Attending: Hematology & Oncology

## 2021-08-28 ENCOUNTER — Other Ambulatory Visit: Payer: Self-pay

## 2021-08-28 ENCOUNTER — Inpatient Hospital Stay: Payer: Medicare Other | Admitting: Hematology & Oncology

## 2021-08-28 ENCOUNTER — Encounter: Payer: Self-pay | Admitting: Family

## 2021-08-28 ENCOUNTER — Telehealth: Payer: Self-pay | Admitting: *Deleted

## 2021-08-28 ENCOUNTER — Inpatient Hospital Stay: Payer: Medicare Other

## 2021-08-28 VITALS — BP 134/83 | HR 90 | Temp 98.0°F | Resp 19 | Wt 220.0 lb

## 2021-08-28 DIAGNOSIS — C9001 Multiple myeloma in remission: Secondary | ICD-10-CM | POA: Diagnosis not present

## 2021-08-28 DIAGNOSIS — C9002 Multiple myeloma in relapse: Secondary | ICD-10-CM | POA: Insufficient documentation

## 2021-08-28 DIAGNOSIS — Z171 Estrogen receptor negative status [ER-]: Secondary | ICD-10-CM | POA: Diagnosis not present

## 2021-08-28 DIAGNOSIS — C9000 Multiple myeloma not having achieved remission: Secondary | ICD-10-CM

## 2021-08-28 DIAGNOSIS — C50911 Malignant neoplasm of unspecified site of right female breast: Secondary | ICD-10-CM | POA: Insufficient documentation

## 2021-08-28 DIAGNOSIS — I824Z3 Acute embolism and thrombosis of unspecified deep veins of distal lower extremity, bilateral: Secondary | ICD-10-CM

## 2021-08-28 DIAGNOSIS — Z5111 Encounter for antineoplastic chemotherapy: Secondary | ICD-10-CM | POA: Diagnosis not present

## 2021-08-28 DIAGNOSIS — I2601 Septic pulmonary embolism with acute cor pulmonale: Secondary | ICD-10-CM

## 2021-08-28 LAB — CMP (CANCER CENTER ONLY)
ALT: 29 U/L (ref 0–44)
AST: 22 U/L (ref 15–41)
Albumin: 3.9 g/dL (ref 3.5–5.0)
Alkaline Phosphatase: 67 U/L (ref 38–126)
Anion gap: 10 (ref 5–15)
BUN: 19 mg/dL (ref 8–23)
CO2: 26 mmol/L (ref 22–32)
Calcium: 9.1 mg/dL (ref 8.9–10.3)
Chloride: 102 mmol/L (ref 98–111)
Creatinine: 1.26 mg/dL — ABNORMAL HIGH (ref 0.44–1.00)
GFR, Estimated: 45 mL/min — ABNORMAL LOW
Glucose, Bld: 97 mg/dL (ref 70–99)
Potassium: 4 mmol/L (ref 3.5–5.1)
Sodium: 138 mmol/L (ref 135–145)
Total Bilirubin: 0.8 mg/dL (ref 0.3–1.2)
Total Protein: 6.8 g/dL (ref 6.5–8.1)

## 2021-08-28 LAB — CBC WITH DIFFERENTIAL (CANCER CENTER ONLY)
Abs Immature Granulocytes: 0.02 10*3/uL (ref 0.00–0.07)
Basophils Absolute: 0.1 10*3/uL (ref 0.0–0.1)
Basophils Relative: 1 %
Eosinophils Absolute: 0.2 10*3/uL (ref 0.0–0.5)
Eosinophils Relative: 3 %
HCT: 41.8 % (ref 36.0–46.0)
Hemoglobin: 13.8 g/dL (ref 12.0–15.0)
Immature Granulocytes: 0 %
Lymphocytes Relative: 42 %
Lymphs Abs: 2.8 10*3/uL (ref 0.7–4.0)
MCH: 30.8 pg (ref 26.0–34.0)
MCHC: 33 g/dL (ref 30.0–36.0)
MCV: 93.3 fL (ref 80.0–100.0)
Monocytes Absolute: 0.4 10*3/uL (ref 0.1–1.0)
Monocytes Relative: 6 %
Neutro Abs: 3.2 10*3/uL (ref 1.7–7.7)
Neutrophils Relative %: 48 %
Platelet Count: 213 10*3/uL (ref 150–400)
RBC: 4.48 MIL/uL (ref 3.87–5.11)
RDW: 15.8 % — ABNORMAL HIGH (ref 11.5–15.5)
WBC Count: 6.6 10*3/uL (ref 4.0–10.5)
nRBC: 0 % (ref 0.0–0.2)

## 2021-08-28 LAB — LACTATE DEHYDROGENASE: LDH: 134 U/L (ref 98–192)

## 2021-08-28 MED ORDER — SODIUM CHLORIDE 0.9% FLUSH
10.0000 mL | Freq: Once | INTRAVENOUS | Status: AC
  Administered 2021-08-28: 10 mL via INTRAVENOUS

## 2021-08-28 MED ORDER — PROCHLORPERAZINE MALEATE 10 MG PO TABS: 10.0000 mg | ORAL_TABLET | Freq: Once | ORAL | Status: DC

## 2021-08-28 MED ORDER — ANTICOAGULANT SODIUM CITRATE LOCK FLUSH 4% (120 MG/3ML) IV SOLN
6.0000 mL | PREFILLED_SYRINGE | Freq: Once | INTRAVENOUS | Status: AC
  Administered 2021-08-28: 6 mL
  Filled 2021-08-28: qty 6

## 2021-08-28 MED ORDER — BORTEZOMIB CHEMO SQ INJECTION 3.5 MG (2.5MG/ML)
1.3000 mg/m2 | Freq: Once | INTRAMUSCULAR | Status: AC
  Administered 2021-08-28: 2.75 mg via SUBCUTANEOUS
  Filled 2021-08-28: qty 1.1

## 2021-08-28 NOTE — Progress Notes (Signed)
Hematology and Oncology Follow Up Visit  Marcia [name] [id number] August 02, 1951 70 y.o. 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  Unfortunately, she seemed to have problems with the pomalidomide.  She was having shortness of breath.  She is having fatigue.  We told her to go to the emergency room.  She did not.  She just stopped the pomalidomide.  She says she is feeling better..  She currently is only on Velcade with Decadron.  I am not sure if this will be adequate enough to treat the relapsed myeloma.  Her last kappa light chain back in August was 10.1 mg/dL.  She does not have a monoclonal spike in her blood.    There is been no problems with the Eliquis.  She is on low-dose Eliquis.  She is on this for maintenance.  She has had no fever.  She has had no rashes.  She did have a episode of gout.  She says she took some cherry juice and drank a lot of water for this.  Currently, I would have to say that her performance status is probably ECOG 1.    Medications:  Allergies as of 08/28/2021       Reactions   Bactrim [sulfamethoxazole-trimethoprim] Other (See Comments)   ? Renal failure.   Heparin Other (See Comments)   HIT   Zofran [ondansetron] Shortness Of Breath   "felt like throat was closing"    Clarithromycin Diarrhea, Nausea And Vomiting   Latex Rash   Macrodantin [nitrofurantoin] Rash        Medication List        Accurate as of August 28, 2021  2:37 PM. If you have any questions, ask your nurse or doctor.          STOP taking these medications    pomalidomide 3 MG capsule Commonly known as: POMALYST Stopped by: [name], MD       TAKE these medications    acetaminophen 325 MG tablet Commonly known as: TYLENOL Take 650 mg by mouth daily as needed for moderate pain or headache.   amoxicillin 500 MG capsule Commonly known as: AMOXIL Prior to dentist   Eliquis 2.5 MG Tabs tablet Generic drug: apixaban TAKE 1 TABLET BY MOUTH TWICE A DAY   famciclovir 250 MG tablet Commonly known as: FAMVIR TAKE 1 TABLET BY MOUTH EVERY DAY   feeding supplement Liqd Take 237 mLs by mouth 3 (three) times daily between meals. What changed: when to take this   folic acid 1 MG tablet Commonly known as: FOLVITE TAKE 2 TABLETS (2 MG TOTAL) BY MOUTH DAILY.   lidocaine-prilocaine cream Commonly known as: EMLA Apply 1 application topically as needed (local anesthetic).   LORazepam 0.5 MG tablet Commonly known as: ATIVAN Take 1 tablet (0.5 mg total) by mouth every 8 (  eight) hours.   losartan 25 MG tablet Commonly known as: COZAAR Take 25 mg by mouth daily.   traMADol 50 MG tablet Commonly known as: ULTRAM Take 1 tablet (50 mg total) by mouth every 12 (twelve) hours as needed.        Allergies:  Allergies  Allergen Reactions   Bactrim [Sulfamethoxazole-Trimethoprim] Other (See Comments)    ? Renal failure.   Heparin Other (See Comments)    HIT   Zofran [Ondansetron] Shortness Of Breath    "felt like throat was closing"   Clarithromycin Diarrhea and Nausea And Vomiting   Latex Rash   Macrodantin [Nitrofurantoin] Rash    Past Medical History, Surgical history, Social history, and Family History were reviewed and updated.  Review of  Systems: Review of Systems  Constitutional: Negative.   HENT: Negative.    Eyes: Negative.   Respiratory: Negative.    Cardiovascular: Negative.   Gastrointestinal: Negative.   Genitourinary: Negative.   Musculoskeletal: Negative.   Skin: Negative.   Neurological: Negative.   Endo/Heme/Allergies: Negative.   Psychiatric/Behavioral: Negative.      Physical Exam:  vitals were not taken for this visit.   Wt Readings from Last 3 Encounters:  08/28/21 220 lb (99.8 kg)  06/28/21 237 lb 6.4 oz (107.7 kg)  05/31/21 238 lb 12.8 oz (108.3 kg)   Her vital signs are temperature of 98.  Pulse 90.  Blood pressure 134/83.  Weight is 220 pounds.  Physical Exam Vitals reviewed.  HENT:     Head: Normocephalic and atraumatic.  Eyes:     Pupils: Pupils are equal, round, and reactive to light.  Cardiovascular:     Rate and Rhythm: Normal rate and regular rhythm.     Heart sounds: Normal heart sounds.  Pulmonary:     Effort: Pulmonary effort is normal.     Breath sounds: Normal breath sounds.  Abdominal:     General: Bowel sounds are normal.     Palpations: Abdomen is soft.  Musculoskeletal:        General: No tenderness or deformity. Normal range of motion.     Cervical back: Normal range of motion.  Lymphadenopathy:     Cervical: No cervical adenopathy.  Skin:    General: Skin is warm and dry.     Findings: No erythema or rash.  Neurological:     Mental Status: She is alert and oriented to person, place, and time.  Psychiatric:        Behavior: Behavior normal.        Thought Content: Thought content normal.        Judgment: Judgment normal.     Lab Results  Component Value Date   WBC 6.6 08/28/2021   HGB 13.8 08/28/2021   HCT 41.8 08/28/2021   MCV 93.3 08/28/2021   PLT 213 08/28/2021   Lab Results  Component Value Date   FERRITIN 820 (H) 05/17/2018   IRON 272 (H) 05/17/2018   TIBC 295 05/17/2018   UIBC 23 05/17/2018   IRONPCTSAT 92 (H) 05/17/2018   Lab Results   Component Value Date   RETICCTPCT 1.3 03/27/2011   RBC 4.48 08/28/2021   RETICCTABS 56.4 03/27/2011   Lab Results  Component Value Date   KPAFRELGTCHN 101.1 (H) 06/28/2021   LAMBDASER 38.9 (H) 06/28/2021   KAPLAMBRATIO 2.60 (H) 06/28/2021   Lab Results  Component Value Date   IGGSERUM 810 06/28/2021   IGA 200 06/28/2021   IGMSERUM 98 06/28/2021   Lab Results  Component Value Date   TOTALPROTELP 6.4 06/28/2021   ALBUMINELP 3.6 06/28/2021   A1GS 0.1 06/28/2021   A2GS 0.8 06/28/2021   BETS 1.1 06/28/2021   BETA2SER 0.4 07/02/2015   GAMS 0.8 06/28/2021   MSPIKE Not Observed 06/28/2021   SPEI Comment 06/28/2021     Chemistry      Component Value Date/Time   NA 138 08/28/2021 1314   NA 141 07/06/2017 0816   NA 141 01/05/2017 0956   K 4.0 08/28/2021 1314   K 3.6 07/06/2017 0816   K 4.3 01/05/2017 0956   CL 102 08/28/2021 1314   CL 101 07/06/2017 0816   CO2 26 08/28/2021 1314   CO2 28 07/06/2017 0816   CO2 23 01/05/2017 0956   BUN 19 08/28/2021 1314   BUN 28 (H) 07/06/2017 0816   BUN 22.4 01/05/2017 0956   CREATININE 1.26 (H) 08/28/2021 1314   CREATININE 1.6 (H) 07/06/2017 0816   CREATININE 1.4 (H) 01/05/2017 0956      Component Value Date/Time   CALCIUM 9.1 08/28/2021 1314   CALCIUM 9.1 07/06/2017 0816   CALCIUM 9.3 01/05/2017 0956   ALKPHOS 67 08/28/2021 1314   ALKPHOS 58 07/06/2017 0816   ALKPHOS 52 01/05/2017 0956   AST 22 08/28/2021 1314   AST 14 01/05/2017 0956   ALT 29 08/28/2021 1314   ALT 58 (H) 07/06/2017 0816   ALT 13 01/05/2017 0956   BILITOT 0.8 08/28/2021 1314   BILITOT 0.56 01/05/2017 0956       Impression and Plan: Ms. Matuszak is a very pleasant 70 yo caucasian female with now recurrent kappa light chain myeloma .  Will be interesting to see what her light chain level is.  Again she stopped the pomalidomide.  Usually find that her kappa light chain is going up, we will have to add something to the Velcade.  I probably would add  daratumumab.  I think this would be reasonable.  The only issue with this is that she [place].  Might be a real challenge for her to get her weekly.  We will continue her on the Velcade for right now.  I will plan to see her back in about a month.  Again if we see that her light chain is elevating, then we will have to make a change.  [name], MD 10/5/20222:37 PM

## 2021-08-28 NOTE — Patient Instructions (Signed)
Implanted Port Home Guide An implanted port is a device that is placed under the skin. It is usually placed in the chest. The device can be used to give IV medicine, to take blood, or for dialysis. You may have an implanted port if: You need IV medicine that would be irritating to the small veins in your hands or arms. You need IV medicines, such as antibiotics, for a long period of time. You need IV nutrition for a long period of time. You need dialysis. When you have a port, your health care provider can choose to use the port instead of veins in your arms for these procedures. You may have fewer limitations when using a port than you would if you used other types of long-term IVs, and you will likely be able to return to normal activities after your incision heals. An implanted port has two main parts: Reservoir. The reservoir is the part where a needle is inserted to give medicines or draw blood. The reservoir is round. After it is placed, it appears as a small, raised area under your skin. Catheter. The catheter is a thin, flexible tube that connects the reservoir to a vein. Medicine that is inserted into the reservoir goes into the catheter and then into the vein. How is my port accessed? To access your port: A numbing cream may be placed on the skin over the port site. Your health care provider will put on a mask and sterile gloves. The skin over your port will be cleaned carefully with a germ-killing soap and allowed to dry. Your health care provider will gently pinch the port and insert a needle into it. Your health care provider will check for a blood return to make sure the port is in the vein and is not clogged. If your port needs to remain accessed to get medicine continuously (constant infusion), your health care provider will place a clear bandage (dressing) over the needle site. The dressing and needle will need to be changed every week, or as told by your health care provider. What  is flushing? Flushing helps keep the port from getting clogged. Follow instructions from your health care provider about how and when to flush the port. Ports are usually flushed with saline solution or a medicine called heparin. The need for flushing will depend on how the port is used: If the port is only used from time to time to give medicines or draw blood, the port may need to be flushed: Before and after medicines have been given. Before and after blood has been drawn. As part of routine maintenance. Flushing may be recommended every 4-6 weeks. If a constant infusion is running, the port may not need to be flushed. Throw away any syringes in a disposal container that is meant for sharp items (sharps container). You can buy a sharps container from a pharmacy, or you can make one by using an empty hard plastic bottle with a cover. How long will my port stay implanted? The port can stay in for as long as your health care provider thinks it is needed. When it is time for the port to come out, a surgery will be done to remove it. The surgery will be similar to the procedure that was done to put the port in. Follow these instructions at home:  Flush your port as told by your health care provider. If you need an infusion over several days, follow instructions from your health care provider about how   to take care of your port site. Make sure you: Wash your hands with soap and water before you change your dressing. If soap and water are not available, use alcohol-based hand sanitizer. Change your dressing as told by your health care provider. Place any used dressings or infusion bags into a plastic bag. Throw that bag in the trash. Keep the dressing that covers the needle clean and dry. Do not get it wet. Do not use scissors or sharp objects near the tube. Keep the tube clamped, unless it is being used. Check your port site every day for signs of infection. Check for: Redness, swelling, or  pain. Fluid or blood. Pus or a bad smell. Protect the skin around the port site. Avoid wearing bra straps that rub or irritate the site. Protect the skin around your port from seat belts. Place a soft pad over your chest if needed. Bathe or shower as told by your health care provider. The site may get wet as long as you are not actively receiving an infusion. Return to your normal activities as told by your health care provider. Ask your health care provider what activities are safe for you. Carry a medical alert card or wear a medical alert bracelet at all times. This will let health care providers know that you have an implanted port in case of an emergency. Get help right away if: You have redness, swelling, or pain at the port site. You have fluid or blood coming from your port site. You have pus or a bad smell coming from the port site. You have a fever. Summary Implanted ports are usually placed in the chest for long-term IV access. Follow instructions from your health care provider about flushing the port and changing bandages (dressings). Take care of the area around your port by avoiding clothing that puts pressure on the area, and by watching for signs of infection. Protect the skin around your port from seat belts. Place a soft pad over your chest if needed. Get help right away if you have a fever or you have redness, swelling, pain, drainage, or a bad smell at the port site. This information is not intended to replace advice given to you by your health care provider. Make sure you discuss any questions you have with your health care provider. Document Revised: 01/30/2021 Document Reviewed: 03/26/2020 Elsevier Patient Education  2022 [employer] Inc.  

## 2021-08-28 NOTE — Telephone Encounter (Signed)
Per 08/28/21 los - gave upcoming appointments - confirmed - view mychart

## 2021-08-28 NOTE — Patient Instructions (Signed)
[facility] [place]  Discharge Instructions: Thank you for choosing [facility] to provide your oncology and hematology care.   If you have a lab appointment with the [facility], please go directly to the [facility] and check in at the registration area.  Wear comfortable clothing and clothing appropriate for easy access to any Portacath or PICC line.   We strive to give you quality time with your provider. You may need to reschedule your appointment if you arrive late (15 or more minutes).  Arriving late affects you and other patients whose appointments are after yours.  Also, if you miss three or more appointments without notifying the office, you may be dismissed from the clinic at the provider's discretion.      For prescription refill requests, have your pharmacy contact our office and allow 72 hours for refills to be completed.    Today you received the following chemotherapy and/or immunotherapy agents Velcade      To help prevent nausea and vomiting after your treatment, we encourage you to take your nausea medication as directed.  BELOW ARE SYMPTOMS THAT SHOULD BE REPORTED IMMEDIATELY: *FEVER GREATER THAN 100.4 F (38 C) OR HIGHER *CHILLS OR SWEATING *NAUSEA AND VOMITING THAT IS NOT CONTROLLED WITH YOUR NAUSEA MEDICATION *UNUSUAL SHORTNESS OF BREATH *UNUSUAL BRUISING OR BLEEDING *URINARY PROBLEMS (pain or burning when urinating, or frequent urination) *BOWEL PROBLEMS (unusual diarrhea, constipation, pain near the anus) TENDERNESS IN MOUTH AND THROAT WITH OR WITHOUT PRESENCE OF ULCERS (sore throat, sores in mouth, or a toothache) UNUSUAL RASH, SWELLING OR PAIN  UNUSUAL VAGINAL DISCHARGE OR ITCHING   Items with * indicate a potential emergency and should be followed up as soon as possible or go to the Emergency Department if any problems should occur.  Please show the CHEMOTHERAPY ALERT CARD or IMMUNOTHERAPY ALERT CARD at check-in to the  Emergency Department and triage nurse. Should you have questions after your visit or need to cancel or reschedule your appointment, please contact [facility]  [phone] and follow the prompts.  Office hours are 8:00 a.m. to 4:30 p.m. Monday - Friday. Please note that voicemails left after 4:00 p.m. may not be returned until the following business day.  We are closed weekends and major holidays. You have access to a nurse at all times for urgent questions. Please call the main number to the clinic [phone] and follow the prompts.  For any non-urgent questions, you may also contact your provider using MyChart. We now offer e-Visits for anyone 64 and older to request care online for non-urgent symptoms. For details visit [email or web address].   Also download the MyChart app! Go to the app store, search "MyChart", open the app, select [facility], and log in with your MyChart username and password.  Due to Covid, a mask is required upon entering the hospital/clinic. If you do not have a mask, one will be given to you upon arrival. For doctor visits, patients may have 1 support person aged 47 or older with them. For treatment visits, patients cannot have anyone with them due to current Covid guidelines and our immunocompromised population.

## 2021-08-29 LAB — KAPPA/LAMBDA LIGHT CHAINS
Kappa free light chain: 88.6 mg/L — ABNORMAL HIGH (ref 3.3–19.4)
Kappa, lambda light chain ratio: 3.03 — ABNORMAL HIGH (ref 0.26–1.65)
Lambda free light chains: 29.2 mg/L — ABNORMAL HIGH (ref 5.7–26.3)

## 2021-08-29 LAB — IGG, IGA, IGM
IgA: 313 mg/dL (ref 64–422)
IgG (Immunoglobin G), Serum: 1035 mg/dL (ref 586–1602)
IgM (Immunoglobulin M), Srm: 77 mg/dL (ref 26–217)

## 2021-08-30 LAB — PROTEIN ELECTROPHORESIS, SERUM
A/G Ratio: 1.2 (ref 0.7–1.7)
Albumin ELP: 3.6 g/dL (ref 2.9–4.4)
Alpha-1-Globulin: 0.1 g/dL (ref 0.0–0.4)
Alpha-2-Globulin: 0.8 g/dL (ref 0.4–1.0)
Beta Globulin: 1.1 g/dL (ref 0.7–1.3)
Gamma Globulin: 1 g/dL (ref 0.4–1.8)
Globulin, Total: 3.1 g/dL (ref 2.2–3.9)
Total Protein ELP: 6.7 g/dL (ref 6.0–8.5)

## 2021-09-26 ENCOUNTER — Other Ambulatory Visit: Payer: Self-pay

## 2021-09-26 ENCOUNTER — Encounter: Payer: Self-pay | Admitting: Family

## 2021-09-26 ENCOUNTER — Inpatient Hospital Stay: Payer: Medicare Other | Admitting: Family

## 2021-09-26 ENCOUNTER — Inpatient Hospital Stay: Payer: Medicare Other

## 2021-09-26 ENCOUNTER — Inpatient Hospital Stay: Payer: Medicare Other | Attending: Hematology & Oncology

## 2021-09-26 VITALS — BP 146/71 | HR 76 | Temp 98.3°F | Resp 22 | Ht 64.0 in | Wt 221.0 lb

## 2021-09-26 DIAGNOSIS — C9000 Multiple myeloma not having achieved remission: Secondary | ICD-10-CM | POA: Diagnosis not present

## 2021-09-26 DIAGNOSIS — C50911 Malignant neoplasm of unspecified site of right female breast: Secondary | ICD-10-CM | POA: Diagnosis not present

## 2021-09-26 DIAGNOSIS — C9001 Multiple myeloma in remission: Secondary | ICD-10-CM

## 2021-09-26 DIAGNOSIS — Z5111 Encounter for antineoplastic chemotherapy: Secondary | ICD-10-CM | POA: Insufficient documentation

## 2021-09-26 DIAGNOSIS — C9002 Multiple myeloma in relapse: Secondary | ICD-10-CM | POA: Diagnosis present

## 2021-09-26 DIAGNOSIS — Z171 Estrogen receptor negative status [ER-]: Secondary | ICD-10-CM | POA: Insufficient documentation

## 2021-09-26 LAB — CMP (CANCER CENTER ONLY)
ALT: 13 U/L (ref 0–44)
AST: 14 U/L — ABNORMAL LOW (ref 15–41)
Albumin: 3.9 g/dL (ref 3.5–5.0)
Alkaline Phosphatase: 56 U/L (ref 38–126)
Anion gap: 8 (ref 5–15)
BUN: 22 mg/dL (ref 8–23)
CO2: 27 mmol/L (ref 22–32)
Calcium: 9.6 mg/dL (ref 8.9–10.3)
Chloride: 104 mmol/L (ref 98–111)
Creatinine: 1.23 mg/dL — ABNORMAL HIGH (ref 0.44–1.00)
GFR, Estimated: 47 mL/min — ABNORMAL LOW (ref >60)
Glucose, Bld: 102 mg/dL — ABNORMAL HIGH (ref 70–99)
Potassium: 3.7 mmol/L (ref 3.5–5.1)
Sodium: 139 mmol/L (ref 135–145)
Total Bilirubin: 0.6 mg/dL (ref 0.3–1.2)
Total Protein: 6.7 g/dL (ref 6.5–8.1)

## 2021-09-26 LAB — CBC WITH DIFFERENTIAL (CANCER CENTER ONLY)
Abs Immature Granulocytes: 0.03 10*3/uL (ref 0.00–0.07)
Basophils Absolute: 0 10*3/uL (ref 0.0–0.1)
Basophils Relative: 0 %
Eosinophils Absolute: 0.1 10*3/uL (ref 0.0–0.5)
Eosinophils Relative: 2 %
HCT: 41.6 % (ref 36.0–46.0)
Hemoglobin: 13.8 g/dL (ref 12.0–15.0)
Immature Granulocytes: 0 %
Lymphocytes Relative: 35 %
Lymphs Abs: 2.5 10*3/uL (ref 0.7–4.0)
MCH: 31.4 pg (ref 26.0–34.0)
MCHC: 33.2 g/dL (ref 30.0–36.0)
MCV: 94.5 fL (ref 80.0–100.0)
Monocytes Absolute: 0.4 10*3/uL (ref 0.1–1.0)
Monocytes Relative: 5 %
Neutro Abs: 4 10*3/uL (ref 1.7–7.7)
Neutrophils Relative %: 58 %
Platelet Count: 181 10*3/uL (ref 150–400)
RBC: 4.4 MIL/uL (ref 3.87–5.11)
RDW: 16.8 % — ABNORMAL HIGH (ref 11.5–15.5)
WBC Count: 7 10*3/uL (ref 4.0–10.5)
nRBC: 0 % (ref 0.0–0.2)

## 2021-09-26 LAB — LACTATE DEHYDROGENASE: LDH: 142 U/L (ref 98–192)

## 2021-09-26 MED ORDER — ANTICOAGULANT SODIUM CITRATE LOCK FLUSH 4% (120 MG/3ML) IV SOLN
6.0000 mL | PREFILLED_SYRINGE | Freq: Once | INTRAVENOUS | Status: AC
  Administered 2021-09-26: 6 mL
  Filled 2021-09-26: qty 6

## 2021-09-26 MED ORDER — PROCHLORPERAZINE MALEATE 10 MG PO TABS: 10.0000 mg | ORAL_TABLET | Freq: Once | ORAL | Status: DC

## 2021-09-26 MED ORDER — BORTEZOMIB CHEMO SQ INJECTION 3.5 MG (2.5MG/ML)
1.3000 mg/m2 | Freq: Once | INTRAMUSCULAR | Status: AC
  Administered 2021-09-26: 2.75 mg via SUBCUTANEOUS
  Filled 2021-09-26: qty 1.1

## 2021-09-26 MED ORDER — SODIUM CHLORIDE 0.9% FLUSH
10.0000 mL | INTRAVENOUS | Status: DC | PRN
  Administered 2021-09-26: 10 mL

## 2021-09-26 NOTE — Patient Instructions (Signed)
[facility]  Discharge Instructions: Thank you for choosing [facility] to provide your oncology and hematology care.   If you have a lab appointment with the [facility], please go directly to the [facility] and check in at the registration area.  Wear comfortable clothing and clothing appropriate for easy access to any Portacath or PICC line.   We strive to give you quality time with your provider. You may need to reschedule your appointment if you arrive late (15 or more minutes).  Arriving late affects you and other patients whose appointments are after yours.  Also, if you miss three or more appointments without notifying the office, you may be dismissed from the clinic at the provider's discretion.      For prescription refill requests, have your pharmacy contact our office and allow 72 hours for refills to be completed.    Today you received the following chemotherapy and/or immunotherapy agents Velcade      To help prevent nausea and vomiting after your treatment, we encourage you to take your nausea medication as directed.  BELOW ARE SYMPTOMS THAT SHOULD BE REPORTED IMMEDIATELY: *FEVER GREATER THAN 100.4 F (38 C) OR HIGHER *CHILLS OR SWEATING *NAUSEA AND VOMITING THAT IS NOT CONTROLLED WITH YOUR NAUSEA MEDICATION *UNUSUAL SHORTNESS OF BREATH *UNUSUAL BRUISING OR BLEEDING *URINARY PROBLEMS (pain or burning when urinating, or frequent urination) *BOWEL PROBLEMS (unusual diarrhea, constipation, pain near the anus) TENDERNESS IN MOUTH AND THROAT WITH OR WITHOUT PRESENCE OF ULCERS (sore throat, sores in mouth, or a toothache) UNUSUAL RASH, SWELLING OR PAIN  UNUSUAL VAGINAL DISCHARGE OR ITCHING   Items with * indicate a potential emergency and should be followed up as soon as possible or go to the Emergency Department if any problems should occur.  Please show the CHEMOTHERAPY ALERT CARD or IMMUNOTHERAPY ALERT CARD at check-in to the  Emergency Department and triage nurse. Should you have questions after your visit or need to cancel or reschedule your appointment, please contact [facility]  [phone] and follow the prompts.  Office hours are 8:00 a.m. to 4:30 p.m. Monday - Friday. Please note that voicemails left after 4:00 p.m. may not be returned until the following business day.  We are closed weekends and major holidays. You have access to a nurse at all times for urgent questions. Please call the main number to the clinic [phone] and follow the prompts.  For any non-urgent questions, you may also contact your provider using MyChart. We now offer e-Visits for anyone 72 and older to request care online for non-urgent symptoms. For details visit [email or web address].   Also download the MyChart app! Go to the app store, search "MyChart", open the app, select [facility], and log in with your MyChart username and password.  Due to Covid, a mask is required upon entering the hospital/clinic. If you do not have a mask, one will be given to you upon arrival. For doctor visits, patients may have 1 support person aged 47 or older with them. For treatment visits, patients cannot have anyone with them due to current Covid guidelines and our immunocompromised population.

## 2021-09-26 NOTE — Patient Instructions (Signed)

## 2021-09-26 NOTE — Progress Notes (Signed)
Hematology and Oncology Follow Up Visit  Marcia [name] [id number] [birth year] 70 y.o. 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 She is feeling much better since stopping the Pomalidomide.  She states that all of her symptoms have resolved.  No M-spike noted last month, Kappa light chains we improved at 8.86 mg/dL and IgG level 1,035 mg/dL.  She has an ok appetite and drinks 1-2 protein shakes a day. She does feel that she is staying well hydrated throughout the day. Her weight is stable at 221 lbs.  No fever, chills, n/v, cough, rash, dizziness, SOB, chest pain, palpitations, abdominal pain/bloating or changes in bowel or bladder habits.  No bleeding, bruising or petechiae.  No swelling, tenderness, numbness or tingling in her extremities at this time.  She denies falls or syncope.   ECOG Performance Status: 1 - Symptomatic but completely ambulatory  Medications:  Allergies as of 09/26/2021       Reactions   Bactrim [sulfamethoxazole-trimethoprim] Other (See Comments)   ? Renal failure.   Heparin Other (See Comments)   HIT   Zofran [ondansetron] Shortness Of Breath   "felt like throat was closing"   Clarithromycin Diarrhea, Nausea And Vomiting   Latex Rash   Macrodantin [nitrofurantoin] Rash         Medication List        Accurate as of September 26, 2021 10:23 AM. If you have any questions, ask your nurse or doctor.          acetaminophen 325 MG tablet Commonly known as: TYLENOL Take 650 mg by mouth daily as needed for moderate pain or headache.   amoxicillin 500 MG capsule Commonly known as: AMOXIL Prior to dentist   Eliquis 2.5 MG Tabs tablet Generic drug: apixaban TAKE 1 TABLET BY MOUTH TWICE A DAY   famciclovir 250 MG tablet Commonly known as: FAMVIR TAKE 1 TABLET BY MOUTH EVERY DAY   feeding supplement Liqd Take 237 mLs by mouth 3 (three) times daily between meals. What changed: when to take this   folic acid 1 MG tablet Commonly known as: FOLVITE TAKE 2 TABLETS (2 MG TOTAL) BY MOUTH DAILY.   lidocaine-prilocaine cream Commonly known as: EMLA Apply 1 application topically as needed (local anesthetic).   LORazepam 0.5 MG tablet Commonly known as: ATIVAN Take 1 tablet (0.5 mg total) by mouth every 8 (eight) hours.   losartan 25 MG tablet Commonly known as: COZAAR Take 25 mg by mouth daily.   traMADol 50 MG tablet Commonly known as: ULTRAM Take 1 tablet (50 mg total) by mouth every 12 (twelve) hours as needed.        Allergies:  Allergies  Allergen Reactions   Bactrim [Sulfamethoxazole-Trimethoprim] Other (See Comments)    ? Renal failure.   Heparin Other (See Comments)    HIT   Zofran [Ondansetron]  Shortness Of Breath    "felt like throat was closing"   Clarithromycin Diarrhea and Nausea And Vomiting   Latex Rash   Macrodantin [Nitrofurantoin] Rash    Past Medical History, Surgical history, Social history, and Family History were reviewed and updated.  Review of Systems: All other 10 point review of systems is negative.   Physical Exam:  height is 5\' 4"  (1.626 m) and weight is 221 lb (100.2 kg). Her oral temperature is 98.3 F (36.8 C). Her blood pressure is 146/71 (abnormal) and her pulse is 76. Her respiration is 22 (abnormal) and  oxygen saturation is 99%.   Wt Readings from Last 3 Encounters:  09/26/21 221 lb (100.2 kg)  08/28/21 220 lb (99.8 kg)  06/28/21 237 lb 6.4 oz (107.7 kg)    Ocular: Sclerae unicteric, pupils equal, round and reactive to light Ear-nose-throat: Oropharynx clear, dentition fair Lymphatic: No cervical or supraclavicular adenopathy Lungs no rales or rhonchi, good excursion bilaterally Heart regular rate and rhythm, no murmur appreciated Abd soft, nontender, positive bowel sounds MSK no focal spinal tenderness, no joint edema Neuro: non-focal, well-oriented, appropriate affect Breasts: Deferred   Lab Results  Component Value Date   WBC 7.0 09/26/2021   HGB 13.8 09/26/2021   HCT 41.6 09/26/2021   MCV 94.5 09/26/2021   PLT 181 09/26/2021   Lab Results  Component Value Date   FERRITIN 820 (H) 05/17/2018   IRON 272 (H) 05/17/2018   TIBC 295 05/17/2018   UIBC 23 05/17/2018   IRONPCTSAT 92 (H) 05/17/2018   Lab Results  Component Value Date   RETICCTPCT 1.3 03/27/2011   RBC 4.40 09/26/2021   RETICCTABS 56.4 03/27/2011   Lab Results  Component Value Date   KPAFRELGTCHN 88.6 (H) 08/28/2021   LAMBDASER 29.2 (H) 08/28/2021   KAPLAMBRATIO 3.03 (H) 08/28/2021   Lab Results  Component Value Date   IGGSERUM 1,035 08/28/2021   IGA 313 08/28/2021   IGMSERUM 77 08/28/2021   Lab Results  Component Value Date   TOTALPROTELP 6.7 08/28/2021   ALBUMINELP 3.6 08/28/2021   A1GS 0.1 08/28/2021   A2GS 0.8 08/28/2021   BETS 1.1 08/28/2021   BETA2SER 0.4 07/02/2015   GAMS 1.0 08/28/2021   MSPIKE Not Observed 08/28/2021   SPEI Comment 08/28/2021     Chemistry      Component Value Date/Time   NA 139 09/26/2021 0849   NA 141 07/06/2017 0816   NA 141 01/05/2017 0956   K 3.7 09/26/2021 0849   K 3.6 07/06/2017 0816   K 4.3 01/05/2017 0956   CL 104 09/26/2021 0849   CL 101 07/06/2017 0816   CO2 27 09/26/2021 0849   CO2 28 07/06/2017 0816   CO2 23 01/05/2017 0956   BUN 22  09/26/2021 0849   BUN 28 (H) 07/06/2017 0816   BUN 22.4 01/05/2017 0956   CREATININE 1.23 (H) 09/26/2021 0849   CREATININE 1.6 (H) 07/06/2017 0816   CREATININE 1.4 (H) 01/05/2017 0956      Component Value Date/Time   CALCIUM 9.6 09/26/2021 0849   CALCIUM 9.1 07/06/2017 0816   CALCIUM 9.3 01/05/2017 0956   ALKPHOS 56 09/26/2021 0849   ALKPHOS 58 07/06/2017 0816   ALKPHOS 52 01/05/2017 0956   AST 14 (L) 09/26/2021 0849   AST 14 01/05/2017 0956   ALT 13 09/26/2021 0849   ALT 58 (H) 07/06/2017 0816   ALT 13 01/05/2017 0956   BILITOT 0.6 09/26/2021 0849   BILITOT 0.56 01/05/2017 03/05/2017  Impression and Plan: Ms. Leventhal is a very pleasant 70 yo caucasian female with now recurrent kappa light chain myeloma. We will proceed with Velcade today as planned.  For now we will closely follow her light chains per MD. No Daratumumab at this time.  Follow-up in 1 months.  She can contact our office with any questions or concerns.   Lottie Dawson, NP 11/3/202210:23 AM

## 2021-09-27 LAB — IGG, IGA, IGM
IgA: 254 mg/dL (ref 64–422)
IgG (Immunoglobin G), Serum: 981 mg/dL (ref 586–1602)
IgM (Immunoglobulin M), Srm: 82 mg/dL (ref 26–217)

## 2021-09-27 LAB — KAPPA/LAMBDA LIGHT CHAINS
Kappa free light chain: 66.6 mg/L — ABNORMAL HIGH (ref 3.3–19.4)
Kappa, lambda light chain ratio: 2.6 — ABNORMAL HIGH (ref 0.26–1.65)
Lambda free light chains: 25.6 mg/L (ref 5.7–26.3)

## 2021-09-30 LAB — PROTEIN ELECTROPHORESIS, SERUM, WITH REFLEX
A/G Ratio: 1.1 (ref 0.7–1.7)
Albumin ELP: 3.4 g/dL (ref 2.9–4.4)
Alpha-1-Globulin: 0.2 g/dL (ref 0.0–0.4)
Alpha-2-Globulin: 0.8 g/dL (ref 0.4–1.0)
Beta Globulin: 1.2 g/dL (ref 0.7–1.3)
Gamma Globulin: 1 g/dL (ref 0.4–1.8)
Globulin, Total: 3.2 g/dL (ref 2.2–3.9)
Total Protein ELP: 6.6 g/dL (ref 6.0–8.5)

## 2021-10-25 ENCOUNTER — Other Ambulatory Visit: Payer: Medicare Other

## 2021-10-25 ENCOUNTER — Ambulatory Visit: Payer: Medicare Other | Admitting: Family

## 2021-10-28 ENCOUNTER — Ambulatory Visit: Payer: Medicare Other | Admitting: Family

## 2021-10-28 ENCOUNTER — Other Ambulatory Visit: Payer: Medicare Other

## 2021-11-05 ENCOUNTER — Encounter: Payer: Self-pay | Admitting: Family

## 2021-11-05 ENCOUNTER — Inpatient Hospital Stay: Payer: Medicare Other | Attending: Hematology & Oncology

## 2021-11-05 ENCOUNTER — Inpatient Hospital Stay: Payer: Medicare Other | Admitting: Family

## 2021-11-05 ENCOUNTER — Inpatient Hospital Stay: Payer: Medicare Other

## 2021-11-05 ENCOUNTER — Other Ambulatory Visit: Payer: Self-pay

## 2021-11-05 VITALS — BP 148/89 | HR 75 | Temp 98.5°F | Resp 18

## 2021-11-05 VITALS — BP 151/92 | HR 78 | Temp 98.5°F | Resp 18 | Wt 219.1 lb

## 2021-11-05 DIAGNOSIS — C9000 Multiple myeloma not having achieved remission: Secondary | ICD-10-CM

## 2021-11-05 DIAGNOSIS — C9002 Multiple myeloma in relapse: Secondary | ICD-10-CM | POA: Diagnosis present

## 2021-11-05 DIAGNOSIS — Z5111 Encounter for antineoplastic chemotherapy: Secondary | ICD-10-CM | POA: Diagnosis not present

## 2021-11-05 DIAGNOSIS — C9001 Multiple myeloma in remission: Secondary | ICD-10-CM

## 2021-11-05 DIAGNOSIS — C50911 Malignant neoplasm of unspecified site of right female breast: Secondary | ICD-10-CM

## 2021-11-05 LAB — CMP (CANCER CENTER ONLY)
ALT: 11 U/L (ref 0–44)
AST: 13 U/L — ABNORMAL LOW (ref 15–41)
Albumin: 4.1 g/dL (ref 3.5–5.0)
Alkaline Phosphatase: 63 U/L (ref 38–126)
Anion gap: 8 (ref 5–15)
BUN: 22 mg/dL (ref 8–23)
CO2: 29 mmol/L (ref 22–32)
Calcium: 9.7 mg/dL (ref 8.9–10.3)
Chloride: 100 mmol/L (ref 98–111)
Creatinine: 1.19 mg/dL — ABNORMAL HIGH (ref 0.44–1.00)
GFR, Estimated: 49 mL/min — ABNORMAL LOW (ref >60)
Glucose, Bld: 85 mg/dL (ref 70–99)
Potassium: 3.8 mmol/L (ref 3.5–5.1)
Sodium: 137 mmol/L (ref 135–145)
Total Bilirubin: 0.6 mg/dL (ref 0.3–1.2)
Total Protein: 6.9 g/dL (ref 6.5–8.1)

## 2021-11-05 LAB — CBC WITH DIFFERENTIAL (CANCER CENTER ONLY)
Abs Immature Granulocytes: 0.06 10*3/uL (ref 0.00–0.07)
Basophils Absolute: 0 10*3/uL (ref 0.0–0.1)
Basophils Relative: 0 %
Eosinophils Absolute: 0.1 10*3/uL (ref 0.0–0.5)
Eosinophils Relative: 1 %
HCT: 44.4 % (ref 36.0–46.0)
Hemoglobin: 14.5 g/dL (ref 12.0–15.0)
Immature Granulocytes: 1 %
Lymphocytes Relative: 35 %
Lymphs Abs: 3.5 10*3/uL (ref 0.7–4.0)
MCH: 31.3 pg (ref 26.0–34.0)
MCHC: 32.7 g/dL (ref 30.0–36.0)
MCV: 95.9 fL (ref 80.0–100.0)
Monocytes Absolute: 0.6 10*3/uL (ref 0.1–1.0)
Monocytes Relative: 6 %
Neutro Abs: 5.9 10*3/uL (ref 1.7–7.7)
Neutrophils Relative %: 57 %
Platelet Count: 219 10*3/uL (ref 150–400)
RBC: 4.63 MIL/uL (ref 3.87–5.11)
RDW: 15.5 % (ref 11.5–15.5)
WBC Count: 10.2 10*3/uL (ref 4.0–10.5)
nRBC: 0 % (ref 0.0–0.2)

## 2021-11-05 LAB — LACTATE DEHYDROGENASE: LDH: 129 U/L (ref 98–192)

## 2021-11-05 MED ORDER — PROCHLORPERAZINE MALEATE 10 MG PO TABS: 10.0000 mg | ORAL_TABLET | Freq: Once | ORAL | Status: DC

## 2021-11-05 MED ORDER — BORTEZOMIB CHEMO SQ INJECTION 3.5 MG (2.5MG/ML)
1.3000 mg/m2 | Freq: Once | INTRAMUSCULAR | Status: AC
  Administered 2021-11-05: 2.75 mg via SUBCUTANEOUS
  Filled 2021-11-05: qty 1.1

## 2021-11-05 MED ORDER — DEXAMETHASONE 4 MG PO TABS
12.0000 mg | ORAL_TABLET | ORAL | Status: DC
  Administered 2021-11-05: 12 mg via ORAL
  Filled 2021-11-05: qty 3

## 2021-11-05 MED ORDER — ANTICOAGULANT SODIUM CITRATE LOCK FLUSH 4% (120 MG/3ML) IV SOLN
6.0000 mL | PREFILLED_SYRINGE | Freq: Once | INTRAVENOUS | Status: AC
  Administered 2021-11-05: 6 mL
  Filled 2021-11-05: qty 6

## 2021-11-05 MED ORDER — SODIUM CHLORIDE 0.9% FLUSH
10.0000 mL | INTRAVENOUS | Status: DC | PRN
  Administered 2021-11-05: 10 mL

## 2021-11-05 NOTE — Patient Instructions (Signed)

## 2021-11-05 NOTE — Progress Notes (Signed)
Hematology and Oncology Follow Up Visit  Marcia [name] [id number] 1951-04-08 70 y.o. 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 is here today for follow-up and treatment. She is doing well and has no complaints at this time.  No M-spike detected last month. IgG level was 981 mg/dL and kappa light chains 6.66 mg/dL No fever, chills, n/v, cough, rash, dizziness, SOB, chest pain, palpitations, abdominal pain or changes in bowel or bladder habits.  No bleeding, bruising or petechiae.  No swelling, tenderness, numbness or tingling in her extremities.  No falls or syncope.  She is eating well and staying hydrated throughout the day. Her weight is 219 lbs.   ECOG Performance Status: 1 - Symptomatic but completely ambulatory  Medications:  Allergies as of 11/05/2021       Reactions   Bactrim [sulfamethoxazole-trimethoprim] Other (See Comments)   ? Renal failure.   Heparin Other (See Comments)   HIT   Zofran [ondansetron] Shortness Of Breath   "felt like throat was closing"   Clarithromycin Diarrhea, Nausea And Vomiting   Latex Rash   Macrodantin [nitrofurantoin] Rash        Medication List        Accurate as of November 05, 2021  2:11 PM. If you have any questions, ask your nurse or doctor.          acetaminophen 325 MG  tablet Commonly known as: TYLENOL Take 650 mg by mouth daily as needed for moderate pain or headache.   amoxicillin 500 MG capsule Commonly known as: AMOXIL Prior to dentist   Eliquis 2.5 MG Tabs tablet Generic drug: apixaban TAKE 1 TABLET BY MOUTH TWICE A DAY   famciclovir 250 MG tablet Commonly known as: FAMVIR TAKE 1 TABLET BY MOUTH EVERY DAY   feeding supplement Liqd Take 237 mLs by mouth 3 (three) times daily between meals. What changed: when to take this   folic acid 1 MG tablet Commonly known as: FOLVITE TAKE 2 TABLETS (2 MG TOTAL) BY MOUTH DAILY.   lidocaine-prilocaine cream Commonly known as: EMLA Apply 1 application topically as needed (local anesthetic).   LORazepam 0.5 MG tablet Commonly known as: ATIVAN Take 1 tablet (0.5 mg total) by mouth every 8 (eight) hours.   losartan 25 MG tablet Commonly known as: COZAAR Take 25 mg by mouth daily.   traMADol 50 MG tablet Commonly known as: ULTRAM Take 1 tablet (50 mg total) by mouth every 12 (twelve) hours as needed.        Allergies:  Allergies  Allergen Reactions   Bactrim [Sulfamethoxazole-Trimethoprim] Other (See Comments)    ? Renal failure.   Heparin Other (See Comments)    HIT   Zofran [Ondansetron] Shortness Of Breath    "felt like throat was closing"   Clarithromycin Diarrhea and Nausea And Vomiting   Latex Rash   Macrodantin [Nitrofurantoin]  Rash    Past Medical History, Surgical history, Social history, and Family History were reviewed and updated.  Review of Systems: All other 10 point review of systems is negative.   Physical Exam:  vitals were not taken for this visit.   Wt Readings from Last 3 Encounters:  09/26/21 221 lb (100.2 kg)  08/28/21 220 lb (99.8 kg)  06/28/21 237 lb 6.4 oz (107.7 kg)    Ocular: Sclerae unicteric, pupils equal, round and reactive to light Ear-nose-throat: Oropharynx clear, dentition fair Lymphatic: No cervical or supraclavicular adenopathy Lungs no  rales or rhonchi, good excursion bilaterally Heart regular rate and rhythm, no murmur appreciated Abd soft, nontender, positive bowel sounds MSK no focal spinal tenderness, no joint edema Neuro: non-focal, well-oriented, appropriate affect Breasts: Deferred   Lab Results  Component Value Date   WBC 7.0 09/26/2021   HGB 13.8 09/26/2021   HCT 41.6 09/26/2021   MCV 94.5 09/26/2021   PLT 181 09/26/2021   Lab Results  Component Value Date   FERRITIN 820 (H) 05/17/2018   IRON 272 (H) 05/17/2018   TIBC 295 05/17/2018   UIBC 23 05/17/2018   IRONPCTSAT 92 (H) 05/17/2018   Lab Results  Component Value Date   RETICCTPCT 1.3 03/27/2011   RBC 4.40 09/26/2021   RETICCTABS 56.4 03/27/2011   Lab Results  Component Value Date   KPAFRELGTCHN 66.6 (H) 09/26/2021   LAMBDASER 25.6 09/26/2021   KAPLAMBRATIO 2.60 (H) 09/26/2021   Lab Results  Component Value Date   IGGSERUM 981 09/26/2021   IGA 254 09/26/2021   IGMSERUM 82 09/26/2021   Lab Results  Component Value Date   TOTALPROTELP 6.6 09/26/2021   ALBUMINELP 3.4 09/26/2021   A1GS 0.2 09/26/2021   A2GS 0.8 09/26/2021   BETS 1.2 09/26/2021   BETA2SER 0.4 07/02/2015   GAMS 1.0 09/26/2021   MSPIKE Not Observed 09/26/2021   SPEI Comment 08/28/2021     Chemistry      Component Value Date/Time   NA 139 09/26/2021 0849   NA 141 07/06/2017 0816   NA 141 01/05/2017 0956   K 3.7 09/26/2021 0849   K 3.6 07/06/2017 0816   K 4.3 01/05/2017 0956   CL 104 09/26/2021 0849   CL 101 07/06/2017 0816   CO2 27 09/26/2021 0849   CO2 28 07/06/2017 0816   CO2 23 01/05/2017 0956   BUN 22 09/26/2021 0849   BUN 28 (H) 07/06/2017 0816   BUN 22.4 01/05/2017 0956   CREATININE 1.23 (H) 09/26/2021 0849   CREATININE 1.6 (H) 07/06/2017 0816   CREATININE 1.4 (H) 01/05/2017 0956      Component Value Date/Time   CALCIUM 9.6 09/26/2021 0849   CALCIUM 9.1 07/06/2017 0816   CALCIUM 9.3 01/05/2017 0956   ALKPHOS 56 09/26/2021 0849   ALKPHOS 58  07/06/2017 0816   ALKPHOS 52 01/05/2017 0956   AST 14 (L) 09/26/2021 0849   AST 14 01/05/2017 0956   ALT 13 09/26/2021 0849   ALT 58 (H) 07/06/2017 0816   ALT 13 01/05/2017 0956   BILITOT 0.6 09/26/2021 0849   BILITOT 0.56 01/05/2017 0956       Impression and Plan: Ms. Deangelo is a very pleasant 70 yo caucasian female with now recurrent kappa light chain myeloma. We will proceed with Velcade today as planned.  Protein studies are pending.  Follow-up in 1 month.   Lottie Dawson, NP 12/13/20222:11 PM

## 2021-11-06 ENCOUNTER — Other Ambulatory Visit: Payer: Self-pay | Admitting: Hematology & Oncology

## 2021-11-06 DIAGNOSIS — I824Z3 Acute embolism and thrombosis of unspecified deep veins of distal lower extremity, bilateral: Secondary | ICD-10-CM

## 2021-11-06 DIAGNOSIS — I2601 Septic pulmonary embolism with acute cor pulmonale: Secondary | ICD-10-CM

## 2021-11-06 LAB — KAPPA/LAMBDA LIGHT CHAINS
Kappa free light chain: 82.7 mg/L — ABNORMAL HIGH (ref 3.3–19.4)
Kappa, lambda light chain ratio: 2.68 — ABNORMAL HIGH (ref 0.26–1.65)
Lambda free light chains: 30.9 mg/L — ABNORMAL HIGH (ref 5.7–26.3)

## 2021-11-06 LAB — IGG, IGA, IGM
IgA: 270 mg/dL (ref 64–422)
IgG (Immunoglobin G), Serum: 1051 mg/dL (ref 586–1602)
IgM (Immunoglobulin M), Srm: 92 mg/dL (ref 26–217)

## 2021-11-07 ENCOUNTER — Telehealth: Payer: Self-pay | Admitting: *Deleted

## 2021-11-07 NOTE — Telephone Encounter (Signed)
Per 11/07/21 los - called and gave upcoming appointments - requested call back to confirm

## 2021-11-08 LAB — PROTEIN ELECTROPHORESIS, SERUM
A/G Ratio: 1.1 (ref 0.7–1.7)
Albumin ELP: 3.6 g/dL (ref 2.9–4.4)
Alpha-1-Globulin: 0.2 g/dL (ref 0.0–0.4)
Alpha-2-Globulin: 0.8 g/dL (ref 0.4–1.0)
Beta Globulin: 1.2 g/dL (ref 0.7–1.3)
Gamma Globulin: 1.1 g/dL (ref 0.4–1.8)
Globulin, Total: 3.2 g/dL (ref 2.2–3.9)
Total Protein ELP: 6.8 g/dL (ref 6.0–8.5)

## 2021-12-06 ENCOUNTER — Ambulatory Visit: Payer: Medicare Other | Admitting: Family

## 2021-12-06 ENCOUNTER — Ambulatory Visit: Payer: Medicare Other

## 2021-12-06 ENCOUNTER — Other Ambulatory Visit: Payer: Medicare Other

## 2021-12-10 ENCOUNTER — Other Ambulatory Visit: Payer: Self-pay

## 2021-12-10 ENCOUNTER — Encounter: Payer: Self-pay | Admitting: Family

## 2021-12-10 ENCOUNTER — Inpatient Hospital Stay: Payer: Medicare Other | Admitting: Family

## 2021-12-10 ENCOUNTER — Inpatient Hospital Stay: Payer: Medicare Other

## 2021-12-10 ENCOUNTER — Inpatient Hospital Stay: Payer: Medicare Other | Attending: Hematology & Oncology

## 2021-12-10 VITALS — BP 151/90 | HR 96 | Temp 99.2°F | Resp 17 | Ht 58.5 in | Wt 222.0 lb

## 2021-12-10 VITALS — BP 151/90 | HR 96 | Temp 99.2°F | Resp 17 | Ht 58.5 in | Wt 218.5 lb

## 2021-12-10 DIAGNOSIS — C9001 Multiple myeloma in remission: Secondary | ICD-10-CM

## 2021-12-10 DIAGNOSIS — Z5111 Encounter for antineoplastic chemotherapy: Secondary | ICD-10-CM | POA: Diagnosis present

## 2021-12-10 DIAGNOSIS — C50911 Malignant neoplasm of unspecified site of right female breast: Secondary | ICD-10-CM

## 2021-12-10 DIAGNOSIS — I2601 Septic pulmonary embolism with acute cor pulmonale: Secondary | ICD-10-CM | POA: Diagnosis not present

## 2021-12-10 DIAGNOSIS — C9002 Multiple myeloma in relapse: Secondary | ICD-10-CM | POA: Insufficient documentation

## 2021-12-10 DIAGNOSIS — I824Z3 Acute embolism and thrombosis of unspecified deep veins of distal lower extremity, bilateral: Secondary | ICD-10-CM

## 2021-12-10 DIAGNOSIS — C9000 Multiple myeloma not having achieved remission: Secondary | ICD-10-CM

## 2021-12-10 LAB — CBC WITH DIFFERENTIAL (CANCER CENTER ONLY)
Abs Immature Granulocytes: 0.09 10*3/uL — ABNORMAL HIGH (ref 0.00–0.07)
Basophils Absolute: 0 10*3/uL (ref 0.0–0.1)
Basophils Relative: 0 %
Eosinophils Absolute: 0.1 10*3/uL (ref 0.0–0.5)
Eosinophils Relative: 1 %
HCT: 43.8 % (ref 36.0–46.0)
Hemoglobin: 14.5 g/dL (ref 12.0–15.0)
Immature Granulocytes: 1 %
Lymphocytes Relative: 30 %
Lymphs Abs: 3.1 10*3/uL (ref 0.7–4.0)
MCH: 32.2 pg (ref 26.0–34.0)
MCHC: 33.1 g/dL (ref 30.0–36.0)
MCV: 97.3 fL (ref 80.0–100.0)
Monocytes Absolute: 0.6 10*3/uL (ref 0.1–1.0)
Monocytes Relative: 6 %
Neutro Abs: 6.3 10*3/uL (ref 1.7–7.7)
Neutrophils Relative %: 62 %
Platelet Count: 193 10*3/uL (ref 150–400)
RBC: 4.5 MIL/uL (ref 3.87–5.11)
RDW: 14.1 % (ref 11.5–15.5)
WBC Count: 10.2 10*3/uL (ref 4.0–10.5)
nRBC: 0 % (ref 0.0–0.2)

## 2021-12-10 LAB — CMP (CANCER CENTER ONLY)
ALT: 9 U/L (ref 0–44)
AST: 12 U/L — ABNORMAL LOW (ref 15–41)
Albumin: 4.2 g/dL (ref 3.5–5.0)
Alkaline Phosphatase: 57 U/L (ref 38–126)
Anion gap: 11 (ref 5–15)
BUN: 30 mg/dL — ABNORMAL HIGH (ref 8–23)
CO2: 27 mmol/L (ref 22–32)
Calcium: 9.8 mg/dL (ref 8.9–10.3)
Chloride: 100 mmol/L (ref 98–111)
Creatinine: 1.37 mg/dL — ABNORMAL HIGH (ref 0.44–1.00)
GFR, Estimated: 41 mL/min — ABNORMAL LOW (ref >60)
Glucose, Bld: 91 mg/dL (ref 70–99)
Potassium: 4.1 mmol/L (ref 3.5–5.1)
Sodium: 138 mmol/L (ref 135–145)
Total Bilirubin: 0.6 mg/dL (ref 0.3–1.2)
Total Protein: 7.2 g/dL (ref 6.5–8.1)

## 2021-12-10 LAB — LACTATE DEHYDROGENASE: LDH: 136 U/L (ref 98–192)

## 2021-12-10 MED ORDER — BORTEZOMIB CHEMO SQ INJECTION 3.5 MG (2.5MG/ML)
1.3000 mg/m2 | Freq: Once | INTRAMUSCULAR | Status: AC
  Administered 2021-12-10: 2.75 mg via SUBCUTANEOUS
  Filled 2021-12-10: qty 1.1

## 2021-12-10 MED ORDER — ANTICOAGULANT SODIUM CITRATE LOCK FLUSH 4% (120 MG/3ML) IV SOLN
6.0000 mL | PREFILLED_SYRINGE | Freq: Once | INTRAVENOUS | Status: DC
  Filled 2021-12-10: qty 6

## 2021-12-10 MED ORDER — PROCHLORPERAZINE MALEATE 10 MG PO TABS: 10.0000 mg | ORAL_TABLET | Freq: Once | ORAL | Status: DC

## 2021-12-10 MED ORDER — DEXAMETHASONE 4 MG PO TABS
12.0000 mg | ORAL_TABLET | ORAL | Status: DC
  Administered 2021-12-10: 12 mg via ORAL
  Filled 2021-12-10: qty 3

## 2021-12-10 NOTE — Progress Notes (Signed)
Hematology and Oncology Follow Up Visit  LATECIA [name] [id number] 10/26/1951 71 y.o. 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(+), Ki67 of 75% - pathologic CR to chemo Pulmonary Embolism/Bilateral LE DVT Recurrent Kappa light chain myeloma - status post stem cell transplant at [place] in 07/2010    Past Therapy: Neoadjuvant carboplatinum/Taxotere Herceptin/Perjeta - s/p cycle 4 Radiation therapy to the right breast ([place]) Herceptin - adjuvant therapy started 08/18/2018  -- completed on 04/2019   Current Therapy:        PVd --  Started  05/01/2021 - patient stopped pomalidomide in 06/2021 Eliquis 2.5 mg po BID - started on 04/22/2019 - maintenance   Interim History:  Marcia [name] is here today for follow-up and treatment. She is doing well and has no complaints at this time.  No M-spike detected at last visit. IgG level was 1,051 mg/dL and kappa light chains were 8.26 mg/dL.  No issue with infection. No fever, chills, n/v, cough, rash, dizziness, SOB, chest pain, palpitations, abdominal pain or changes in bowel or bladder habits.  No blood loss noted. No bruising or petechiae.  No swelling, tenderness, numbness or tingling in her extremities.  No falls or syncope to report.  She has maintained a good appetite and is staying well hydrated. Her weight is stable at 222 lbs.   ECOG Performance Status: 1 - Symptomatic but completely ambulatory  Medications:  Allergies as of 12/10/2021       Reactions   Bactrim [sulfamethoxazole-trimethoprim] Other (See Comments)   ? Renal failure.   Heparin Other (See Comments)   HIT   Zofran [ondansetron] Shortness Of Breath   "felt like throat was closing"   Clarithromycin Diarrhea, Nausea And Vomiting   Latex Rash   Macrodantin [nitrofurantoin] Rash        Medication List        Accurate as of December 10, 2021 10:07 AM. If you have any questions,  ask your nurse or doctor.          acetaminophen 325 MG tablet Commonly known as: TYLENOL Take 650 mg by mouth daily as needed for moderate pain or headache.   amoxicillin 500 MG capsule Commonly known as: AMOXIL Prior to dentist   Eliquis 2.5 MG Tabs tablet Generic drug: apixaban TAKE 1 TABLET BY MOUTH TWICE A DAY   famciclovir 250 MG tablet Commonly known as: FAMVIR TAKE 1 TABLET BY MOUTH EVERY DAY   feeding supplement Liqd Take 237 mLs by mouth 3 (three) times daily between meals. What changed: when to take this   folic acid 1 MG tablet Commonly known as: FOLVITE TAKE 2 TABLETS (2 MG TOTAL) BY MOUTH DAILY.   lidocaine-prilocaine cream Commonly known as: EMLA Apply 1 application topically as needed (local anesthetic).   LORazepam 0.5 MG tablet Commonly known as: ATIVAN Take 1 tablet (0.5 mg total) by mouth every 8 (eight) hours.   losartan 25 MG tablet Commonly known as: COZAAR Take 25 mg by mouth daily.   traMADol 50 MG tablet Commonly known as: ULTRAM Take 1 tablet (50 mg total) by mouth every 12 (twelve) hours as needed.        Allergies:  Allergies  Allergen Reactions   Bactrim [Sulfamethoxazole-Trimethoprim] Other (See Comments)    ? Renal failure.   Heparin Other (See Comments)    HIT   Zofran [Ondansetron] Shortness Of Breath    "felt like throat was closing"   Clarithromycin  Diarrhea and Nausea And Vomiting   Latex Rash   Macrodantin [Nitrofurantoin] Rash    Past Medical History, Surgical history, Social history, and Family History were reviewed and updated.  Review of Systems: All other 10 point review of systems is negative.   Physical Exam:  vitals were not taken for this visit.   Wt Readings from Last 3 Encounters:  12/10/21 218 lb 8 oz (99.1 kg)  11/05/21 219 lb 1.9 oz (99.4 kg)  09/26/21 221 lb (100.2 kg)    Ocular: Sclerae unicteric, pupils equal, round and reactive to light Ear-nose-throat: Oropharynx clear, dentition  fair Lymphatic: No cervical or supraclavicular adenopathy Lungs no rales or rhonchi, good excursion bilaterally Heart regular rate and rhythm, no murmur appreciated Abd soft, nontender, positive bowel sounds MSK no focal spinal tenderness, no joint edema Neuro: non-focal, well-oriented, appropriate affect Breasts: Deferred   Lab Results  Component Value Date   WBC 10.2 12/10/2021   HGB 14.5 12/10/2021   HCT 43.8 12/10/2021   MCV 97.3 12/10/2021   PLT 193 12/10/2021   Lab Results  Component Value Date   FERRITIN 820 (H) 05/17/2018   IRON 272 (H) 05/17/2018   TIBC 295 05/17/2018   UIBC 23 05/17/2018   IRONPCTSAT 92 (H) 05/17/2018   Lab Results  Component Value Date   RETICCTPCT 1.3 03/27/2011   RBC 4.50 12/10/2021   RETICCTABS 56.4 03/27/2011   Lab Results  Component Value Date   KPAFRELGTCHN 82.7 (H) 11/05/2021   LAMBDASER 30.9 (H) 11/05/2021   KAPLAMBRATIO 2.68 (H) 11/05/2021   Lab Results  Component Value Date   IGGSERUM 1,051 11/05/2021   IGA 270 11/05/2021   IGMSERUM 92 11/05/2021   Lab Results  Component Value Date   TOTALPROTELP 6.8 11/05/2021   ALBUMINELP 3.6 11/05/2021   A1GS 0.2 11/05/2021   A2GS 0.8 11/05/2021   BETS 1.2 11/05/2021   BETA2SER 0.4 07/02/2015   GAMS 1.1 11/05/2021   MSPIKE Not Observed 11/05/2021   SPEI Comment 11/05/2021     Chemistry      Component Value Date/Time   NA 137 11/05/2021 1407   NA 141 07/06/2017 0816   NA 141 01/05/2017 0956   K 3.8 11/05/2021 1407   K 3.6 07/06/2017 0816   K 4.3 01/05/2017 0956   CL 100 11/05/2021 1407   CL 101 07/06/2017 0816   CO2 29 11/05/2021 1407   CO2 28 07/06/2017 0816   CO2 23 01/05/2017 0956   BUN 22 11/05/2021 1407   BUN 28 (H) 07/06/2017 0816   BUN 22.4 01/05/2017 0956   CREATININE 1.19 (H) 11/05/2021 1407   CREATININE 1.6 (H) 07/06/2017 0816   CREATININE 1.4 (H) 01/05/2017 0956      Component Value Date/Time   CALCIUM 9.7 11/05/2021 1407   CALCIUM 9.1 07/06/2017 0816    CALCIUM 9.3 01/05/2017 0956   ALKPHOS 63 11/05/2021 1407   ALKPHOS 58 07/06/2017 0816   ALKPHOS 52 01/05/2017 0956   AST 13 (L) 11/05/2021 1407   AST 14 01/05/2017 0956   ALT 11 11/05/2021 1407   ALT 58 (H) 07/06/2017 0816   ALT 13 01/05/2017 0956   BILITOT 0.6 11/05/2021 1407   BILITOT 0.56 01/05/2017 0956       Impression and Plan: Ms. Mooradian is a very pleasant 71 yo caucasian female with now recurrent kappa light chain myeloma. We will proceed with treatment today as planned.  Protein studies are pending.  Follow-up in 1 month.   Lottie Dawson, NP  1/17/202310:07 AM

## 2021-12-10 NOTE — Patient Instructions (Signed)
Marcia [name] AT [facility]  Discharge Instructions: Thank you for choosing [facility] to provide your oncology and hematology care.   If you have a lab appointment with the [facility], please go directly to the [facility] and check in at the registration area.  Wear comfortable clothing and clothing appropriate for easy access to any Portacath or PICC line.   We strive to give you quality time with your provider. You may need to reschedule your appointment if you arrive late (15 or more minutes).  Arriving late affects you and other patients whose appointments are after yours.  Also, if you miss three or more appointments without notifying the office, you may be dismissed from the clinic at the providers discretion.      For prescription refill requests, have your pharmacy contact our office and allow 72 hours for refills to be completed.    Today you received the following chemotherapy and/or immunotherapy agents velcade      To help prevent nausea and vomiting after your treatment, we encourage you to take your nausea medication as directed.  BELOW ARE SYMPTOMS THAT SHOULD BE REPORTED IMMEDIATELY: *FEVER GREATER THAN 100.4 F (38 C) OR HIGHER *CHILLS OR SWEATING *NAUSEA AND VOMITING THAT IS NOT CONTROLLED WITH YOUR NAUSEA MEDICATION *UNUSUAL SHORTNESS OF BREATH *UNUSUAL BRUISING OR BLEEDING *URINARY PROBLEMS (pain or burning when urinating, or frequent urination) *BOWEL PROBLEMS (unusual diarrhea, constipation, pain near the anus) TENDERNESS IN MOUTH AND THROAT WITH OR WITHOUT PRESENCE OF ULCERS (sore throat, sores in mouth, or a toothache) UNUSUAL RASH, SWELLING OR PAIN  UNUSUAL VAGINAL DISCHARGE OR ITCHING   Items with * indicate a potential emergency and should be followed up as soon as possible or go to the Emergency Department if any problems should occur.  Please show the CHEMOTHERAPY ALERT CARD or IMMUNOTHERAPY ALERT CARD at check-in to the  Emergency Department and triage nurse. Should you have questions after your visit or need to cancel or reschedule your appointment, please contact [facility]  [phone] and follow the prompts.  Office hours are 8:00 a.m. to 4:30 p.m. Monday - Friday. Please note that voicemails left after 4:00 p.m. may not be returned until the following business day.  We are closed weekends and major holidays. You have access to a nurse at all times for urgent questions. Please call the main number to the clinic [phone] and follow the prompts.  For any non-urgent questions, you may also contact your provider using MyChart. We now offer e-Visits for anyone 61 and older to request care online for non-urgent symptoms. For details visit [email or web address].   Also download the MyChart app! Go to the app store, search "MyChart", open the app, select [facility], and log in with your MyChart username and password.  Due to Covid, a mask is required upon entering the hospital/clinic. If you do not have a mask, one will be given to you upon arrival. For doctor visits, patients may have 1 support person aged 15 or older with them. For treatment visits, patients cannot have anyone with them due to current Covid guidelines and our immunocompromised population.

## 2021-12-11 LAB — KAPPA/LAMBDA LIGHT CHAINS
Kappa free light chain: 82.6 mg/L — ABNORMAL HIGH (ref 3.3–19.4)
Kappa, lambda light chain ratio: 3.5 — ABNORMAL HIGH (ref 0.26–1.65)
Lambda free light chains: 23.6 mg/L (ref 5.7–26.3)

## 2021-12-11 LAB — PROTEIN ELECTROPHORESIS, SERUM
A/G Ratio: 1.1 (ref 0.7–1.7)
Albumin ELP: 3.6 g/dL (ref 2.9–4.4)
Alpha-1-Globulin: 0.2 g/dL (ref 0.0–0.4)
Alpha-2-Globulin: 0.8 g/dL (ref 0.4–1.0)
Beta Globulin: 1.2 g/dL (ref 0.7–1.3)
Gamma Globulin: 1 g/dL (ref 0.4–1.8)
Globulin, Total: 3.3 g/dL (ref 2.2–3.9)
Total Protein ELP: 6.9 g/dL (ref 6.0–8.5)

## 2021-12-11 LAB — IGG, IGA, IGM
IgA: 226 mg/dL (ref 64–422)
IgG (Immunoglobin G), Serum: 947 mg/dL (ref 586–1602)
IgM (Immunoglobulin M), Srm: 98 mg/dL (ref 26–217)

## 2021-12-12 ENCOUNTER — Other Ambulatory Visit: Payer: Self-pay | Admitting: Family

## 2021-12-12 ENCOUNTER — Telehealth: Payer: Self-pay

## 2021-12-12 DIAGNOSIS — C9001 Multiple myeloma in remission: Secondary | ICD-10-CM

## 2021-12-12 DIAGNOSIS — C50911 Malignant neoplasm of unspecified site of right female breast: Secondary | ICD-10-CM

## 2021-12-12 MED ORDER — DEXAMETHASONE 6 MG PO TABS: 12.0000 mg | ORAL_TABLET | Freq: Every day | ORAL | 1 refills | Status: DC

## 2021-12-12 NOTE — Telephone Encounter (Signed)
Called and LM informing patient of lab results (ok per DPR), requested a CB with refill information.

## 2021-12-12 NOTE — Telephone Encounter (Signed)
-----   Message from Celso Amy, NP sent at 12/12/2021  8:43 AM EST ----- Protein studies still look good. What refills did she need?   ----- Message ----- From: Interface, Lab In [place] Sent: 12/10/2021  10:04 AM EST To: Celso Amy, NP

## 2021-12-12 NOTE — Telephone Encounter (Signed)
Pt states it was a refill of Decadron. Sarah already sent Rx in and pt had received notification from pharmacy that Rx was ready.

## 2021-12-31 ENCOUNTER — Other Ambulatory Visit: Payer: Self-pay | Admitting: Hematology & Oncology

## 2022-01-07 ENCOUNTER — Inpatient Hospital Stay: Payer: Medicare Other

## 2022-01-07 ENCOUNTER — Inpatient Hospital Stay: Payer: Medicare Other | Admitting: Family

## 2022-01-10 ENCOUNTER — Inpatient Hospital Stay: Payer: Medicare Other

## 2022-01-10 ENCOUNTER — Other Ambulatory Visit: Payer: Medicare Other

## 2022-01-10 ENCOUNTER — Ambulatory Visit: Payer: Medicare Other

## 2022-01-10 ENCOUNTER — Ambulatory Visit: Payer: Medicare Other | Admitting: Family

## 2022-01-21 ENCOUNTER — Inpatient Hospital Stay: Payer: Medicare Other

## 2022-01-21 ENCOUNTER — Inpatient Hospital Stay: Payer: Medicare Other | Admitting: Family

## 2022-01-21 ENCOUNTER — Other Ambulatory Visit: Payer: Self-pay

## 2022-01-21 ENCOUNTER — Inpatient Hospital Stay: Payer: Medicare Other | Attending: Hematology & Oncology

## 2022-01-21 VITALS — BP 134/58 | HR 77 | Temp 98.4°F | Resp 18 | Ht 59.0 in | Wt 224.0 lb

## 2022-01-21 DIAGNOSIS — C9002 Multiple myeloma in relapse: Secondary | ICD-10-CM | POA: Insufficient documentation

## 2022-01-21 DIAGNOSIS — C9001 Multiple myeloma in remission: Secondary | ICD-10-CM

## 2022-01-21 DIAGNOSIS — C50911 Malignant neoplasm of unspecified site of right female breast: Secondary | ICD-10-CM

## 2022-01-21 DIAGNOSIS — I2601 Septic pulmonary embolism with acute cor pulmonale: Secondary | ICD-10-CM

## 2022-01-21 DIAGNOSIS — I824Z3 Acute embolism and thrombosis of unspecified deep veins of distal lower extremity, bilateral: Secondary | ICD-10-CM

## 2022-01-21 DIAGNOSIS — Z5111 Encounter for antineoplastic chemotherapy: Secondary | ICD-10-CM | POA: Insufficient documentation

## 2022-01-21 DIAGNOSIS — Z95828 Presence of other vascular implants and grafts: Secondary | ICD-10-CM

## 2022-01-21 LAB — CMP (CANCER CENTER ONLY)
ALT: 11 U/L (ref 0–44)
AST: 13 U/L — ABNORMAL LOW (ref 15–41)
Albumin: 3.7 g/dL (ref 3.5–5.0)
Alkaline Phosphatase: 50 U/L (ref 38–126)
Anion gap: 9 (ref 5–15)
BUN: 20 mg/dL (ref 8–23)
CO2: 28 mmol/L (ref 22–32)
Calcium: 8.9 mg/dL (ref 8.9–10.3)
Chloride: 103 mmol/L (ref 98–111)
Creatinine: 1.27 mg/dL — ABNORMAL HIGH (ref 0.44–1.00)
GFR, Estimated: 45 mL/min — ABNORMAL LOW (ref >60)
Glucose, Bld: 87 mg/dL (ref 70–99)
Potassium: 4.1 mmol/L (ref 3.5–5.1)
Sodium: 140 mmol/L (ref 135–145)
Total Bilirubin: 0.7 mg/dL (ref 0.3–1.2)
Total Protein: 6.6 g/dL (ref 6.5–8.1)

## 2022-01-21 LAB — CBC WITH DIFFERENTIAL (CANCER CENTER ONLY)
Abs Immature Granulocytes: 0.05 10*3/uL (ref 0.00–0.07)
Basophils Absolute: 0 10*3/uL (ref 0.0–0.1)
Basophils Relative: 1 %
Eosinophils Absolute: 0.1 10*3/uL (ref 0.0–0.5)
Eosinophils Relative: 2 %
HCT: 42.4 % (ref 36.0–46.0)
Hemoglobin: 14.1 g/dL (ref 12.0–15.0)
Immature Granulocytes: 1 %
Lymphocytes Relative: 33 %
Lymphs Abs: 2.9 10*3/uL (ref 0.7–4.0)
MCH: 32.6 pg (ref 26.0–34.0)
MCHC: 33.3 g/dL (ref 30.0–36.0)
MCV: 97.9 fL (ref 80.0–100.0)
Monocytes Absolute: 0.4 10*3/uL (ref 0.1–1.0)
Monocytes Relative: 5 %
Neutro Abs: 5.1 10*3/uL (ref 1.7–7.7)
Neutrophils Relative %: 58 %
Platelet Count: 203 10*3/uL (ref 150–400)
RBC: 4.33 MIL/uL (ref 3.87–5.11)
RDW: 13.6 % (ref 11.5–15.5)
WBC Count: 8.6 10*3/uL (ref 4.0–10.5)
nRBC: 0 % (ref 0.0–0.2)

## 2022-01-21 LAB — LACTATE DEHYDROGENASE: LDH: 123 U/L (ref 98–192)

## 2022-01-21 MED ORDER — BORTEZOMIB CHEMO SQ INJECTION 3.5 MG (2.5MG/ML)
1.3000 mg/m2 | Freq: Once | INTRAMUSCULAR | Status: AC
  Administered 2022-01-21: 2.75 mg via SUBCUTANEOUS
  Filled 2022-01-21: qty 1.1

## 2022-01-21 MED ORDER — SODIUM CHLORIDE 0.9% FLUSH
10.0000 mL | INTRAVENOUS | Status: DC | PRN
  Administered 2022-01-21: 10 mL via INTRAVENOUS

## 2022-01-21 MED ORDER — DEXAMETHASONE 4 MG PO TABS
12.0000 mg | ORAL_TABLET | ORAL | Status: DC
  Filled 2022-01-21: qty 3

## 2022-01-21 MED ORDER — PROCHLORPERAZINE MALEATE 10 MG PO TABS
10.0000 mg | ORAL_TABLET | Freq: Once | ORAL | Status: DC
  Filled 2022-01-21: qty 1

## 2022-01-21 NOTE — Addendum Note (Signed)
Addended by: Burney Gauze R on: 01/21/2022 10:55 AM   Modules accepted: Orders

## 2022-01-21 NOTE — Progress Notes (Signed)
Hematology and Oncology Follow Up Visit  Marcia [name] [id number] Jan 08, 1951 71 y.o. 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 is here today for follow-up and treatment. She is doing well and has no complaints at this time.  Last month M-spike was not detected, IgG level 947 mg/dL and kappa light chains 8.26 mg/dL.  No fever, chills, n/v, cough, rash, dizziness, SOB, chest pain, palpitations, abdominal pain or changes in bowel or bladder habits.  No blood loss noted. No abnormal bruising, no petechiae.  No swelling, tenderness, numbness or tingling in her extremities.  No falls or syncope to report.  She has maintained a good appetite and is doing her best to stay well hydrated. Her weight is stable at 224 lbs.   ECOG Performance Status: 1 - Symptomatic but completely ambulatory  Medications:  Allergies as of 01/21/2022       Reactions   Bactrim [sulfamethoxazole-trimethoprim] Other (See Comments)   ? Renal failure.   Heparin Other (See Comments)   HIT   Zofran [ondansetron] Shortness Of Breath   "felt like throat was closing"   Clarithromycin Diarrhea, Nausea And Vomiting   Latex Rash   Macrodantin [nitrofurantoin] Rash        Medication List        Accurate as of January 21, 2022 10:27 AM. If you have any questions, ask your  nurse or doctor.          acetaminophen 325 MG tablet Commonly known as: TYLENOL Take 650 mg by mouth daily as needed for moderate pain or headache.   amoxicillin 500 MG capsule Commonly known as: AMOXIL Prior to dentist   dexamethasone 6 MG tablet Commonly known as: DECADRON Take 2 tablets (12 mg total) by mouth daily. Take 30 minutes prior to treatment.   Eliquis 2.5 MG Tabs tablet Generic drug: apixaban TAKE 1 TABLET BY MOUTH TWICE A DAY   famciclovir 250 MG tablet Commonly known as: FAMVIR TAKE 1 TABLET BY MOUTH EVERY DAY   feeding supplement Liqd Take 237 mLs by mouth 3 (three) times daily between meals. What changed: when to take this   folic acid 1 MG tablet Commonly known as: FOLVITE TAKE 2 TABLETS (2 MG TOTAL) BY MOUTH DAILY.   lidocaine-prilocaine cream Commonly known as: EMLA Apply 1 application topically as needed (local anesthetic).   LORazepam 0.5 MG tablet Commonly known as: ATIVAN Take 1 tablet (0.5 mg total) by mouth every 8 (eight) hours.   losartan 25 MG tablet Commonly known as: COZAAR Take 25 mg by mouth daily.   traMADol 50 MG tablet Commonly known as: ULTRAM Take 1 tablet (50 mg total) by mouth every 12 (twelve) hours as needed.        Allergies:  Allergies  Allergen Reactions   Bactrim [Sulfamethoxazole-Trimethoprim] Other (See Comments)    ? Renal failure.   Heparin Other (  See Comments)    HIT   Zofran [Ondansetron] Shortness Of Breath    "felt like throat was closing"   Clarithromycin Diarrhea and Nausea And Vomiting   Latex Rash   Macrodantin [Nitrofurantoin] Rash    Past Medical History, Surgical history, Social history, and Family History were reviewed and updated.  Review of Systems: All other 10 point review of systems is negative.   Physical Exam:  height is $RemoveB'4\' 11"'lHqKqhTZ$  (1.499 m) and weight is 224 lb (101.6 kg). Her oral temperature is 98.4 F (36.9 C). Her blood pressure is 134/58 (abnormal) and her pulse is 77.  Her respiration is 18 and oxygen saturation is 98%.   Wt Readings from Last 3 Encounters:  01/21/22 224 lb (101.6 kg)  12/10/21 222 lb 0.6 oz (100.7 kg)  12/10/21 218 lb 8 oz (99.1 kg)    Ocular: Sclerae unicteric, pupils equal, round and reactive to light Ear-nose-throat: Oropharynx clear, dentition fair Lymphatic: No cervical or supraclavicular adenopathy Lungs no rales or rhonchi, good excursion bilaterally Heart regular rate and rhythm, no murmur appreciated Abd soft, nontender, positive bowel sounds MSK no focal spinal tenderness, no joint edema Neuro: non-focal, well-oriented, appropriate affect Breasts: Deferred   Lab Results  Component Value Date   WBC 8.6 01/21/2022   HGB 14.1 01/21/2022   HCT 42.4 01/21/2022   MCV 97.9 01/21/2022   PLT 203 01/21/2022   Lab Results  Component Value Date   FERRITIN 820 (H) 05/17/2018   IRON 272 (H) 05/17/2018   TIBC 295 05/17/2018   UIBC 23 05/17/2018   IRONPCTSAT 92 (H) 05/17/2018   Lab Results  Component Value Date   RETICCTPCT 1.3 03/27/2011   RBC 4.33 01/21/2022   RETICCTABS 56.4 03/27/2011   Lab Results  Component Value Date   KPAFRELGTCHN 82.6 (H) 12/10/2021   LAMBDASER 23.6 12/10/2021   KAPLAMBRATIO 3.50 (H) 12/10/2021   Lab Results  Component Value Date   IGGSERUM 947 12/10/2021   IGA 226 12/10/2021   IGMSERUM 98 12/10/2021   Lab Results  Component Value Date   TOTALPROTELP 6.9 12/10/2021   ALBUMINELP 3.6 12/10/2021   A1GS 0.2 12/10/2021   A2GS 0.8 12/10/2021   BETS 1.2 12/10/2021   BETA2SER 0.4 07/02/2015   GAMS 1.0 12/10/2021   MSPIKE Not Observed 12/10/2021   SPEI Comment 12/10/2021     Chemistry      Component Value Date/Time   NA 138 12/10/2021 0933   NA 141 07/06/2017 0816   NA 141 01/05/2017 0956   K 4.1 12/10/2021 0933   K 3.6 07/06/2017 0816   K 4.3 01/05/2017 0956   CL 100 12/10/2021 0933   CL 101 07/06/2017 0816   CO2 27 12/10/2021 0933   CO2 28 07/06/2017 0816   CO2 23 01/05/2017  0956   BUN 30 (H) 12/10/2021 0933   BUN 28 (H) 07/06/2017 0816   BUN 22.4 01/05/2017 0956   CREATININE 1.37 (H) 12/10/2021 0933   CREATININE 1.6 (H) 07/06/2017 0816   CREATININE 1.4 (H) 01/05/2017 0956      Component Value Date/Time   CALCIUM 9.8 12/10/2021 0933   CALCIUM 9.1 07/06/2017 0816   CALCIUM 9.3 01/05/2017 0956   ALKPHOS 57 12/10/2021 0933   ALKPHOS 58 07/06/2017 0816   ALKPHOS 52 01/05/2017 0956   AST 12 (L) 12/10/2021 0933   AST 14 01/05/2017 0956   ALT 9 12/10/2021 0933   ALT 58 (H) 07/06/2017 0816   ALT 13 01/05/2017 0956   BILITOT  0.6 12/10/2021 0933   BILITOT 0.56 01/05/2017 0956       Impression and Plan: Ms. Bellefeuille is a very pleasant 71 yo caucasian female with now recurrent kappa light chain myeloma.  We will proceed with treatment today.  Follow-up in 1 month.  Protein studies are pending.   Lottie Dawson, NP 2/28/202310:27 AM

## 2022-01-21 NOTE — Patient Instructions (Signed)
[facility]  Discharge Instructions: Thank you for choosing [facility] to provide your oncology and hematology care.   If you have a lab appointment with the [facility], please go directly to the [facility] and check in at the registration area.  Wear comfortable clothing and clothing appropriate for easy access to any Portacath or PICC line.   We strive to give you quality time with your provider. You may need to reschedule your appointment if you arrive late (15 or more minutes).  Arriving late affects you and other patients whose appointments are after yours.  Also, if you miss three or more appointments without notifying the office, you may be dismissed from the clinic at the providers discretion.      For prescription refill requests, have your pharmacy contact our office and allow 72 hours for refills to be completed.    Today you received the following chemotherapy and/or immunotherapy agents Velcade      To help prevent nausea and vomiting after your treatment, we encourage you to take your nausea medication as directed.  BELOW ARE SYMPTOMS THAT SHOULD BE REPORTED IMMEDIATELY: *FEVER GREATER THAN 100.4 F (38 C) OR HIGHER *CHILLS OR SWEATING *NAUSEA AND VOMITING THAT IS NOT CONTROLLED WITH YOUR NAUSEA MEDICATION *UNUSUAL SHORTNESS OF BREATH *UNUSUAL BRUISING OR BLEEDING *URINARY PROBLEMS (pain or burning when urinating, or frequent urination) *BOWEL PROBLEMS (unusual diarrhea, constipation, pain near the anus) TENDERNESS IN MOUTH AND THROAT WITH OR WITHOUT PRESENCE OF ULCERS (sore throat, sores in mouth, or a toothache) UNUSUAL RASH, SWELLING OR PAIN  UNUSUAL VAGINAL DISCHARGE OR ITCHING   Items with * indicate a potential emergency and should be followed up as soon as possible or go to the Emergency Department if any problems should occur.  Please show the CHEMOTHERAPY ALERT CARD or IMMUNOTHERAPY ALERT CARD at check-in to the  Emergency Department and triage nurse. Should you have questions after your visit or need to cancel or reschedule your appointment, please contact [facility]  [phone] and follow the prompts.  Office hours are 8:00 a.m. to 4:30 p.m. Monday - Friday. Please note that voicemails left after 4:00 p.m. may not be returned until the following business day.  We are closed weekends and major holidays. You have access to a nurse at all times for urgent questions. Please call the main number to the clinic [phone] and follow the prompts.  For any non-urgent questions, you may also contact your provider using MyChart. We now offer e-Visits for anyone 21 and older to request care online for non-urgent symptoms. For details visit [email or web address].   Also download the MyChart app! Go to the app store, search "MyChart", open the app, select [facility], and log in with your MyChart username and password.  Due to Covid, a mask is required upon entering the hospital/clinic. If you do not have a mask, one will be given to you upon arrival. For doctor visits, patients may have 1 support person aged 84 or older with them. For treatment visits, patients cannot have anyone with them due to current Covid guidelines and our immunocompromised population.

## 2022-01-22 LAB — IGG, IGA, IGM
IgA: 180 mg/dL (ref 64–422)
IgG (Immunoglobin G), Serum: 843 mg/dL (ref 586–1602)
IgM (Immunoglobulin M), Srm: 102 mg/dL (ref 26–217)

## 2022-01-22 LAB — KAPPA/LAMBDA LIGHT CHAINS
Kappa free light chain: 95.5 mg/L — ABNORMAL HIGH (ref 3.3–19.4)
Kappa, lambda light chain ratio: 4.55 — ABNORMAL HIGH (ref 0.26–1.65)
Lambda free light chains: 21 mg/L (ref 5.7–26.3)

## 2022-01-23 LAB — PROTEIN ELECTROPHORESIS, SERUM
A/G Ratio: 1.1 (ref 0.7–1.7)
Albumin ELP: 3.4 g/dL (ref 2.9–4.4)
Alpha-1-Globulin: 0.2 g/dL (ref 0.0–0.4)
Alpha-2-Globulin: 0.8 g/dL (ref 0.4–1.0)
Beta Globulin: 1.2 g/dL (ref 0.7–1.3)
Gamma Globulin: 0.9 g/dL (ref 0.4–1.8)
Globulin, Total: 3 g/dL (ref 2.2–3.9)
Total Protein ELP: 6.4 g/dL (ref 6.0–8.5)

## 2022-02-17 ENCOUNTER — Encounter: Payer: Self-pay | Admitting: Family

## 2022-02-17 ENCOUNTER — Other Ambulatory Visit: Payer: Self-pay

## 2022-02-17 ENCOUNTER — Inpatient Hospital Stay: Payer: Medicare Other

## 2022-02-17 ENCOUNTER — Inpatient Hospital Stay: Payer: Medicare Other | Admitting: Family

## 2022-02-17 ENCOUNTER — Inpatient Hospital Stay: Payer: Medicare Other | Attending: Hematology & Oncology

## 2022-02-17 VITALS — BP 144/72 | HR 76 | Temp 98.4°F | Resp 17 | Ht 59.0 in | Wt 227.0 lb

## 2022-02-17 DIAGNOSIS — Z5111 Encounter for antineoplastic chemotherapy: Secondary | ICD-10-CM | POA: Diagnosis present

## 2022-02-17 DIAGNOSIS — C9001 Multiple myeloma in remission: Secondary | ICD-10-CM

## 2022-02-17 DIAGNOSIS — C9002 Multiple myeloma in relapse: Secondary | ICD-10-CM | POA: Diagnosis present

## 2022-02-17 DIAGNOSIS — I824Z3 Acute embolism and thrombosis of unspecified deep veins of distal lower extremity, bilateral: Secondary | ICD-10-CM | POA: Diagnosis not present

## 2022-02-17 DIAGNOSIS — I2601 Septic pulmonary embolism with acute cor pulmonale: Secondary | ICD-10-CM | POA: Diagnosis not present

## 2022-02-17 DIAGNOSIS — C50911 Malignant neoplasm of unspecified site of right female breast: Secondary | ICD-10-CM

## 2022-02-17 LAB — CBC WITH DIFFERENTIAL (CANCER CENTER ONLY)
Abs Immature Granulocytes: 0.03 10*3/uL (ref 0.00–0.07)
Basophils Absolute: 0 10*3/uL (ref 0.0–0.1)
Basophils Relative: 1 %
Eosinophils Absolute: 0.1 10*3/uL (ref 0.0–0.5)
Eosinophils Relative: 1 %
HCT: 41.4 % (ref 36.0–46.0)
Hemoglobin: 13.7 g/dL (ref 12.0–15.0)
Immature Granulocytes: 0 %
Lymphocytes Relative: 38 %
Lymphs Abs: 2.7 10*3/uL (ref 0.7–4.0)
MCH: 32.7 pg (ref 26.0–34.0)
MCHC: 33.1 g/dL (ref 30.0–36.0)
MCV: 98.8 fL (ref 80.0–100.0)
Monocytes Absolute: 0.5 10*3/uL (ref 0.1–1.0)
Monocytes Relative: 6 %
Neutro Abs: 3.8 10*3/uL (ref 1.7–7.7)
Neutrophils Relative %: 54 %
Platelet Count: 182 10*3/uL (ref 150–400)
RBC: 4.19 MIL/uL (ref 3.87–5.11)
RDW: 13.4 % (ref 11.5–15.5)
WBC Count: 7.2 10*3/uL (ref 4.0–10.5)
nRBC: 0 % (ref 0.0–0.2)

## 2022-02-17 LAB — CMP (CANCER CENTER ONLY)
ALT: 10 U/L (ref 0–44)
AST: 12 U/L — ABNORMAL LOW (ref 15–41)
Albumin: 4.1 g/dL (ref 3.5–5.0)
Alkaline Phosphatase: 48 U/L (ref 38–126)
Anion gap: 7 (ref 5–15)
BUN: 27 mg/dL — ABNORMAL HIGH (ref 8–23)
CO2: 28 mmol/L (ref 22–32)
Calcium: 9.5 mg/dL (ref 8.9–10.3)
Chloride: 101 mmol/L (ref 98–111)
Creatinine: 1.3 mg/dL — ABNORMAL HIGH (ref 0.44–1.00)
GFR, Estimated: 44 mL/min — ABNORMAL LOW (ref >60)
Glucose, Bld: 91 mg/dL (ref 70–99)
Potassium: 3.9 mmol/L (ref 3.5–5.1)
Sodium: 136 mmol/L (ref 135–145)
Total Bilirubin: 0.6 mg/dL (ref 0.3–1.2)
Total Protein: 6.7 g/dL (ref 6.5–8.1)

## 2022-02-17 LAB — LACTATE DEHYDROGENASE: LDH: 117 U/L (ref 98–192)

## 2022-02-17 MED ORDER — PROCHLORPERAZINE MALEATE 10 MG PO TABS: 10.0000 mg | ORAL_TABLET | Freq: Once | ORAL | Status: DC

## 2022-02-17 MED ORDER — DEXAMETHASONE 4 MG PO TABS: 12.0000 mg | ORAL_TABLET | ORAL | Status: DC

## 2022-02-17 MED ORDER — BORTEZOMIB CHEMO SQ INJECTION 3.5 MG (2.5MG/ML)
1.3000 mg/m2 | Freq: Once | INTRAMUSCULAR | Status: AC
  Administered 2022-02-17: 2.75 mg via SUBCUTANEOUS
  Filled 2022-02-17: qty 1.1

## 2022-02-17 MED ORDER — SODIUM CHLORIDE 0.9% FLUSH
10.0000 mL | Freq: Once | INTRAVENOUS | Status: AC
  Administered 2022-02-17: 10 mL via INTRAVENOUS

## 2022-02-17 NOTE — Patient Instructions (Signed)
[facility]  Discharge Instructions: ?Thank you for choosing [facility] to provide your oncology and hematology care.  ? ?If you have a lab appointment with the [facility], please go directly to the [facility] and check in at the registration area. ? ?Wear comfortable clothing and clothing appropriate for easy access to any Portacath or PICC line.  ? ?We strive to give you quality time with your provider. You may need to reschedule your appointment if you arrive late (15 or more minutes).  Arriving late affects you and other patients whose appointments are after yours.  Also, if you miss three or more appointments without notifying the office, you may be dismissed from the clinic at the provider?s discretion.    ?  ?For prescription refill requests, have your pharmacy contact our office and allow 72 hours for refills to be completed.   ? ?Today you received the following chemotherapy and/or immunotherapy agents Velcade    ?  ?To help prevent nausea and vomiting after your treatment, we encourage you to take your nausea medication as directed. ? ?BELOW ARE SYMPTOMS THAT SHOULD BE REPORTED IMMEDIATELY: ?*FEVER GREATER THAN 100.4 F (38 ?C) OR HIGHER ?*CHILLS OR SWEATING ?*NAUSEA AND VOMITING THAT IS NOT CONTROLLED WITH YOUR NAUSEA MEDICATION ?*UNUSUAL SHORTNESS OF BREATH ?*UNUSUAL BRUISING OR BLEEDING ?*URINARY PROBLEMS (pain or burning when urinating, or frequent urination) ?*BOWEL PROBLEMS (unusual diarrhea, constipation, pain near the anus) ?TENDERNESS IN MOUTH AND THROAT WITH OR WITHOUT PRESENCE OF ULCERS (sore throat, sores in mouth, or a toothache) ?UNUSUAL RASH, SWELLING OR PAIN  ?UNUSUAL VAGINAL DISCHARGE OR ITCHING  ? ?Items with * indicate a potential emergency and should be followed up as soon as possible or go to the Emergency Department if any problems should occur. ? ?Please show the CHEMOTHERAPY ALERT CARD or IMMUNOTHERAPY ALERT CARD at check-in to the  Emergency Department and triage nurse. ?Should you have questions after your visit or need to cancel or reschedule your appointment, please contact [facility]  [phone] and follow the prompts.  Office hours are 8:00 a.m. to 4:30 p.m. Monday - Friday. Please note that voicemails left after 4:00 p.m. may not be returned until the following business day.  We are closed weekends and major holidays. You have access to a nurse at all times for urgent questions. Please call the main number to the clinic [phone] and follow the prompts. ? ?For any non-urgent questions, you may also contact your provider using MyChart. We now offer e-Visits for anyone 58 and older to request care online for non-urgent symptoms. For details visit [email or web address]. ?  ?Also download the MyChart app! Go to the app store, search "MyChart", open the app, select [facility], and log in with your MyChart username and password. ? ?Due to Covid, a mask is required upon entering the hospital/clinic. If you do not have a mask, one will be given to you upon arrival. For doctor visits, patients may have 1 support person aged 59 or older with them. For treatment visits, patients cannot have anyone with them due to current Covid guidelines and our immunocompromised population.  ?

## 2022-02-17 NOTE — Progress Notes (Signed)
?Hematology and Oncology Follow Up Visit ? ?Marcia [name] ?[id number] ?09-11-1951 71 y.o. ?02/17/2022 ? ? 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  ?Past Therapy: ?Neoadjuvant carboplatinum/Taxotere Herceptin/Perjeta - s/p cycle 4 ?Radiation therapy to the right breast [facility]) ?Herceptin - adjuvant therapy started 08/18/2018  -- completed on 04/2019 ?  ?Current Therapy:        ?PVd --  Started  05/01/2021 - patient stopped pomalidomide in 06/2021 ?Eliquis 2.5 mg po BID - started on 04/22/2019 - maintenance ?  ?Interim History:  [name] is here today for follow-up and treatment. She is doing well and has no complaints at this time.  ?No M-spike noted last month. IgG level was 843 mg/dL and kappa light chains 9.55 mg/dL.  ?She has mild SOB with over exertion and takes a break to rest when needed.  ?No fever, chills, n/v, cough, rash, dizziness, chest pain, palpitations, abdominal pain or changes in bowel or bladder habits.  ?No blood loss, bruising or petechiae noted.  ?No swelling or tenderness in her extremities.  ?She has intermittent tingling in the bottoms of her feet.  ?No falls or syncope to report.  ?She has maintained a good appetite and is staying well hydrated. Her weight is stable at 227 lbs.  ? ?ECOG Performance Status: 1 - Symptomatic but completely ambulatory ? ?Medications:  ?Allergies as of 02/17/2022   ? ?   Reactions  ? Bactrim [sulfamethoxazole-trimethoprim] Other (See Comments)  ? ? Renal failure.  ? Heparin Other (See Comments)  ? HIT  ? Zofran [ondansetron] Shortness Of Breath  ? "felt like throat was closing"  ? Clarithromycin Diarrhea, Nausea And Vomiting  ? Latex Rash  ? Macrodantin [nitrofurantoin] Rash  ? ?  ? ?  ?Medication List  ?  ? ?  ?  Accurate as of February 17, 2022 11:12 AM. If you have any questions, ask your nurse or doctor.  ?  ?  ? ?  ? ?acetaminophen 325 MG tablet ?Commonly known as: TYLENOL ?Take 650 mg by mouth daily as needed for moderate pain or headache. ?  ?amoxicillin 500 MG capsule ?Commonly known as: AMOXIL ?Prior to dentist ?  ?dexamethasone 6 MG tablet ?Commonly known as: DECADRON ?Take 2 tablets (12 mg total) by mouth daily. Take 30 minutes prior to treatment. ?  ?Eliquis 2.5 MG Tabs tablet ?Generic drug: apixaban ?TAKE 1 TABLET BY MOUTH TWICE A DAY ?  ?famciclovir 250 MG tablet ?Commonly known as: FAMVIR ?TAKE 1 TABLET BY MOUTH EVERY DAY ?  ?feeding supplement Liqd ?Take 237 mLs by mouth 3 (three) times daily between meals. ?What changed: when to take this ?  ?folic acid 1 MG tablet ?Commonly known as: FOLVITE ?TAKE 2 TABLETS (2 MG TOTAL) BY MOUTH DAILY. ?  ?lidocaine-prilocaine cream ?Commonly known as: EMLA ?Apply 1 application topically as needed (local anesthetic). ?  ?LORazepam 0.5 MG tablet ?Commonly known as: ATIVAN ?Take 1 tablet (0.5 mg total) by mouth every 8 (eight) hours. ?  ?losartan 25 MG tablet ?Commonly known as: COZAAR ?Take 25 mg by mouth daily. ?  ?traMADol 50 MG tablet ?Commonly known as: ULTRAM ?Take 1 tablet (50 mg total) by mouth every 12 (twelve) hours as needed. ?  ? ?  ? ? ?Allergies:  ?Allergies  ?Allergen  Reactions  ? Bactrim [Sulfamethoxazole-Trimethoprim] Other (See Comments)  ?  ? Renal failure.  ? Heparin Other (See Comments)  ?  HIT  ? Zofran [Ondansetron] Shortness Of Breath  ?  "felt like throat was closing"  ? Clarithromycin Diarrhea and Nausea And Vomiting  ? Latex Rash  ? Macrodantin [Nitrofurantoin] Rash  ? ? ?Past Medical History, Surgical history, Social history, and Family History were reviewed and updated. ? ?Review of Systems: ?All other 10 point review of systems is negative.  ? ?Physical Exam: ? height is _0  (1.499 m) and weight is 227 lb (103 kg). Her oral temperature is 98.4 ?F  (36.9 ?C). Her blood pressure is 144/72 (abnormal) and her pulse is 76. Her respiration is 17 and oxygen saturation is 99%.  ? ?Wt Readings from Last 3 Encounters:  ?02/17/22 227 lb (103 kg)  ?01/21/22 224 lb (101.6 kg)  ?12/10/21 222 lb 0.6 oz (100.7 kg)  ? ? ?Ocular: Sclerae unicteric, pupils equal, round and reactive to light ?Ear-nose-throat: Oropharynx clear, dentition fair ?Lymphatic: No cervical or supraclavicular adenopathy ?Lungs no rales or rhonchi, good excursion bilaterally ?Heart regular rate and rhythm, no murmur appreciated ?Abd soft, nontender, positive bowel sounds ?MSK no focal spinal tenderness, no joint edema ?Neuro: non-focal, well-oriented, appropriate affect ?Breasts: Deferred  ? ?Lab Results  ?Component Value Date  ? WBC 7.2 02/17/2022  ? HGB 13.7 02/17/2022  ? HCT 41.4 02/17/2022  ? MCV 98.8 02/17/2022  ? PLT 182 02/17/2022  ? ?Lab Results  ?Component Value Date  ? FERRITIN 820 (H) 05/17/2018  ? IRON 272 (H) 05/17/2018  ? TIBC 295 05/17/2018  ? UIBC 23 05/17/2018  ? IRONPCTSAT 92 (H) 05/17/2018  ? ?Lab Results  ?Component Value Date  ? RETICCTPCT 1.3 03/27/2011  ? RBC 4.19 02/17/2022  ? RETICCTABS 56.4 03/27/2011  ? ?Lab Results  ?Component Value Date  ? KPAFRELGTCHN 95.5 (H) 01/21/2022  ? LAMBDASER 21.0 01/21/2022  ? KAPLAMBRATIO 4.55 (H) 01/21/2022  ? ?Lab Results  ?Component Value Date  ? IGGSERUM 843 01/21/2022  ? IGA 180 01/21/2022  ? IGMSERUM 102 01/21/2022  ? ?Lab Results  ?Component Value Date  ? TOTALPROTELP 6.4 01/21/2022  ? ALBUMINELP 3.4 01/21/2022  ? A1GS 0.2 01/21/2022  ? A2GS 0.8 01/21/2022  ? BETS 1.2 01/21/2022  ? BETA2SER 0.4 07/02/2015  ? GAMS 0.9 01/21/2022  ? MSPIKE Not Observed 01/21/2022  ? SPEI Comment 01/21/2022  ? ?  Chemistry   ?   ?Component Value Date/Time  ? NA 136 02/17/2022 1010  ? NA 141 07/06/2017 0816  ? NA 141 01/05/2017 0956  ? K 3.9 02/17/2022 1010  ? K 3.6 07/06/2017 0816  ? K 4.3 01/05/2017 0956  ? CL 101 02/17/2022 1010  ? CL 101 07/06/2017 0816  ?  CO2 28 02/17/2022 1010  ? CO2 28 07/06/2017 0816  ? CO2 23 01/05/2017 0956  ? BUN 27 (H) 02/17/2022 1010  ? BUN 28 (H) 07/06/2017 0816  ? BUN 22.4 01/05/2017 0956  ? CREATININE 1.30 (H) 02/17/2022 1010  ? CREATININE 1.6 (H) 07/06/2017 0816  ? CREATININE 1.4 (H) 01/05/2017 0956  ?    ?Component Value Date/Time  ? CALCIUM 9.5 02/17/2022 1010  ? CALCIUM 9.1 07/06/2017 0816  ? CALCIUM 9.3 01/05/2017 0956  ? ALKPHOS 48 02/17/2022 1010  ? ALKPHOS 58 07/06/2017 0816  ? ALKPHOS 52 01/05/2017 0956  ? AST 12 (L) 02/17/2022 1010  ? AST 14 01/05/2017 0956  ? ALT 10 02/17/2022 1010  ?  ALT 58 (H) 07/06/2017 0816  ? ALT 13 01/05/2017 0956  ? BILITOT 0.6 02/17/2022 1010  ? BILITOT 0.56 01/05/2017 0956  ?  ? ? ? ?Impression and Plan: Ms. Szczerba is a very pleasant 71 yo caucasian female with now recurrent kappa light chain myeloma.  ?Protein studies are pending.  ?We will proceed with treatment today as planned.  ?Follow-up in a month.  ? ?Lottie Dawson, NP ?3/27/202311:12 AM ? ?

## 2022-02-18 LAB — IGG, IGA, IGM
IgA: 179 mg/dL (ref 64–422)
IgG (Immunoglobin G), Serum: 839 mg/dL (ref 586–1602)
IgM (Immunoglobulin M), Srm: 94 mg/dL (ref 26–217)

## 2022-02-18 LAB — KAPPA/LAMBDA LIGHT CHAINS
Kappa free light chain: 123.2 mg/L — ABNORMAL HIGH (ref 3.3–19.4)
Kappa, lambda light chain ratio: 6.1 — ABNORMAL HIGH (ref 0.26–1.65)
Lambda free light chains: 20.2 mg/L (ref 5.7–26.3)

## 2022-02-19 LAB — PROTEIN ELECTROPHORESIS, SERUM
A/G Ratio: 1.2 (ref 0.7–1.7)
Albumin ELP: 3.5 g/dL (ref 2.9–4.4)
Alpha-1-Globulin: 0.1 g/dL (ref 0.0–0.4)
Alpha-2-Globulin: 0.8 g/dL (ref 0.4–1.0)
Beta Globulin: 1.2 g/dL (ref 0.7–1.3)
Gamma Globulin: 0.8 g/dL (ref 0.4–1.8)
Globulin, Total: 2.9 g/dL (ref 2.2–3.9)
Total Protein ELP: 6.4 g/dL (ref 6.0–8.5)

## 2022-02-26 ENCOUNTER — Telehealth: Payer: Self-pay | Admitting: *Deleted

## 2022-02-26 NOTE — Telephone Encounter (Signed)
Per scheduling message Judson Roch - called and lvm of upcoming appointment - requested call back to confirm ?

## 2022-02-28 ENCOUNTER — Ambulatory Visit: Payer: Medicare Other

## 2022-02-28 ENCOUNTER — Other Ambulatory Visit: Payer: Self-pay

## 2022-02-28 ENCOUNTER — Encounter: Payer: Self-pay | Admitting: Hematology & Oncology

## 2022-02-28 ENCOUNTER — Inpatient Hospital Stay: Payer: Medicare Other | Attending: Hematology & Oncology | Admitting: Hematology & Oncology

## 2022-02-28 ENCOUNTER — Other Ambulatory Visit: Payer: Self-pay | Admitting: *Deleted

## 2022-02-28 ENCOUNTER — Inpatient Hospital Stay: Payer: Medicare Other

## 2022-02-28 VITALS — BP 149/78 | HR 87 | Temp 98.1°F | Resp 18 | Ht 64.0 in | Wt 229.0 lb

## 2022-02-28 DIAGNOSIS — Z7901 Long term (current) use of anticoagulants: Secondary | ICD-10-CM | POA: Insufficient documentation

## 2022-02-28 DIAGNOSIS — C9001 Multiple myeloma in remission: Secondary | ICD-10-CM

## 2022-02-28 DIAGNOSIS — I2699 Other pulmonary embolism without acute cor pulmonale: Secondary | ICD-10-CM | POA: Insufficient documentation

## 2022-02-28 DIAGNOSIS — C50911 Malignant neoplasm of unspecified site of right female breast: Secondary | ICD-10-CM | POA: Diagnosis not present

## 2022-02-28 DIAGNOSIS — Z171 Estrogen receptor negative status [ER-]: Secondary | ICD-10-CM | POA: Insufficient documentation

## 2022-02-28 DIAGNOSIS — C9002 Multiple myeloma in relapse: Secondary | ICD-10-CM | POA: Diagnosis not present

## 2022-02-28 DIAGNOSIS — Z95828 Presence of other vascular implants and grafts: Secondary | ICD-10-CM

## 2022-02-28 DIAGNOSIS — I82403 Acute embolism and thrombosis of unspecified deep veins of lower extremity, bilateral: Secondary | ICD-10-CM | POA: Insufficient documentation

## 2022-02-28 DIAGNOSIS — Z5111 Encounter for antineoplastic chemotherapy: Secondary | ICD-10-CM | POA: Diagnosis not present

## 2022-02-28 DIAGNOSIS — Z5112 Encounter for antineoplastic immunotherapy: Secondary | ICD-10-CM | POA: Diagnosis not present

## 2022-02-28 MED ORDER — SODIUM CHLORIDE 0.9% FLUSH
10.0000 mL | INTRAVENOUS | Status: DC | PRN
  Administered 2022-02-28: 10 mL via INTRAVENOUS

## 2022-02-28 MED ORDER — MONTELUKAST SODIUM 10 MG PO TABS: 10.0000 mg | ORAL_TABLET | Freq: Every day | ORAL | 0 refills | Status: DC

## 2022-02-28 NOTE — Patient Instructions (Signed)

## 2022-02-28 NOTE — Progress Notes (Signed)
?Hematology and Oncology Follow Up Visit ? ?Marcia [name] ?[id number] ?[birth year] 71 y.o. ?02/28/2022 ? ? 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  ?Past Therapy: ?Neoadjuvant carboplatinum/Taxotere Herceptin/Perjeta - s/p cycle 4 ?Radiation therapy to the right breast [facility]) ?Herceptin - adjuvant therapy started 08/18/2018  -- completed on 04/2019 ?  ?Current Therapy:        ?Faspro/Velcade/Decadron -- start cycle #1 on 03/19/2022 ?Eliquis 2.5 mg po BID - started on 04/22/2019 - maintenance ?  ?Interim History:  Marcia [name] is here today for follow-up.  Unfortunately, I think she is beginning to progress.  We have been following her kappa light chains.  They have been going up over the past couple months.  Most recently, her kappa light chain was up to 13.2 mg/dL. ? ?I think were going to have to change treatments on her.  I had to believe that were going to have to add Faspro to the Velcade.  I think this would be a very reasonable way to try to address the relapse. ? ?I think this would be quite a effective for her.  I hate the fact that she has to come in weekly for the Faspro for 8 weeks but I know that she has certainly done all that we have asked her to do.  She is such a strong woman.   ? ?There is no problems with pain.  She has had no issues with nausea or vomiting.  There is been no cough or shortness of breath.  She has had no leg swelling.  She has had no rashes. ? ?Overall, I would say performance status is probably ECOG 1.   ? ?Medications:  ?Allergies as of 02/28/2022   ? ?   Reactions  ? Bactrim [sulfamethoxazole-trimethoprim] Other (See Comments)  ? ? Renal failure.  ? Heparin Other (See Comments)  ? HIT  ? Zofran [ondansetron] Shortness Of Breath  ?  "felt like throat was closing"  ? Clarithromycin Diarrhea, Nausea And Vomiting  ? Latex Rash  ? Macrodantin [nitrofurantoin] Rash  ? ?  ? ?  ?Medication List  ?  ? ?  ? Accurate as of February 28, 2022  1:38 PM. If you have any questions, ask your nurse or doctor.  ?  ?  ? ?  ? ?acetaminophen 325 MG tablet ?Commonly known as: TYLENOL ?Take 650 mg by mouth daily as needed for moderate pain or headache. ?  ?amoxicillin 500 MG capsule ?Commonly known as: AMOXIL ?Prior to dentist ?  ?dexamethasone 6 MG tablet ?Commonly known as: DECADRON ?Take 2 tablets (12 mg total) by mouth daily. Take 30 minutes prior to treatment. ?  ?Eliquis 2.5 MG Tabs tablet ?Generic drug: apixaban ?TAKE 1 TABLET BY MOUTH TWICE A DAY ?  ?famciclovir 250 MG tablet ?Commonly known as: FAMVIR ?TAKE 1 TABLET BY MOUTH EVERY DAY ?  ?feeding supplement Liqd ?Take 237 mLs by mouth 3 (three) times daily between meals. ?What changed: when to take this ?  ?folic acid 1 MG tablet ?Commonly known as: FOLVITE ?TAKE 2 TABLETS (2 MG TOTAL) BY MOUTH DAILY. ?  ?lidocaine-prilocaine cream ?Commonly known as: EMLA ?Apply 1 application topically as needed (local anesthetic). ?  ?LORazepam 0.5 MG tablet ?Commonly known as: ATIVAN ?Take 1 tablet (0.5 mg total)  by mouth every 8 (eight) hours. ?  ?losartan 25 MG tablet ?Commonly known as: COZAAR ?Take 25 mg by mouth daily. ?  ?traMADol 50 MG tablet ?Commonly known as: ULTRAM ?Take 1 tablet (50 mg total) by mouth every 12 (twelve) hours as needed. ?  ? ?  ? ? ?Allergies:  ?Allergies  ?Allergen Reactions  ? Bactrim [Sulfamethoxazole-Trimethoprim] Other (See Comments)  ?  ? Renal failure.  ? Heparin Other (See Comments)  ?  HIT  ? Zofran [Ondansetron] Shortness Of Breath  ?  "felt like throat was closing"  ? Clarithromycin Diarrhea and Nausea And Vomiting  ? Latex Rash  ? Macrodantin [Nitrofurantoin] Rash  ? ? ?Past Medical History, Surgical history, Social history, and Family History were reviewed and updated. ? ?Review of  Systems: ?Review of Systems  ?Constitutional: Negative.   ?HENT: Negative.    ?Eyes: Negative.   ?Respiratory: Negative.    ?Cardiovascular: Negative.   ?Gastrointestinal: Negative.   ?Genitourinary: Negative.   ?Musculoskeletal: Negative.   ?Skin: Negative.   ?Neurological: Negative.   ?Endo/Heme/Allergies: Negative.   ?Psychiatric/Behavioral: Negative.    ? ? ?Physical Exam: ? height is $RemoveB'5\' 4"'uGWBGNlD$  (1.626 m) and weight is 229 lb (103.9 kg). Her oral temperature is 98.1 ?F (36.7 ?C). Her blood pressure is 149/78 (abnormal) and her pulse is 87. Her respiration is 18 and oxygen saturation is 100%.  ? ?Wt Readings from Last 3 Encounters:  ?02/28/22 229 lb (103.9 kg)  ?02/17/22 227 lb (103 kg)  ?01/21/22 224 lb (101.6 kg)  ? ? ?Physical Exam ?Vitals reviewed.  ?HENT:  ?   Head: Normocephalic and atraumatic.  ?Eyes:  ?   Pupils: Pupils are equal, round, and reactive to light.  ?Cardiovascular:  ?   Rate and Rhythm: Normal rate and regular rhythm.  ?   Heart sounds: Normal heart sounds.  ?Pulmonary:  ?   Effort: Pulmonary effort is normal.  ?   Breath sounds: Normal breath sounds.  ?Abdominal:  ?   General: Bowel sounds are normal.  ?   Palpations: Abdomen is soft.  ?Musculoskeletal:     ?   General: No tenderness or deformity. Normal range of motion.  ?   Cervical back: Normal range of motion.  ?Lymphadenopathy:  ?   Cervical: No cervical adenopathy.  ?Skin: ?   General: Skin is warm and dry.  ?   Findings: No erythema or rash.  ?Neurological:  ?   Mental Status: She is alert and oriented to person, place, and time.  ?Psychiatric:     ?   Behavior: Behavior normal.     ?   Thought Content: Thought content normal.     ?   Judgment: Judgment normal.  ? ? ? ?Lab Results  ?Component Value Date  ? WBC 7.2 02/17/2022  ? HGB 13.7 02/17/2022  ? HCT 41.4 02/17/2022  ? MCV 98.8 02/17/2022  ? PLT 182 02/17/2022  ? ?Lab Results  ?Component Value Date  ? FERRITIN 820 (H) 05/17/2018  ? IRON 272 (H) 05/17/2018  ? TIBC 295 05/17/2018  ? UIBC  23 05/17/2018  ? IRONPCTSAT 92 (H) 05/17/2018  ? ?Lab Results  ?Component Value Date  ? RETICCTPCT 1.3 03/27/2011  ? RBC 4.19 02/17/2022  ? RETICCTABS 56.4 03/27/2011  ? ?Lab Results  ?Component Value Date  ? KPAFRELGTCHN 123.2 (H) 02/17/2022  ? LAMBDASER 20.2 02/17/2022  ? KAPLAMBRATIO 6.10 (H) 02/17/2022  ? ?Lab Results  ?Component Value Date  ? IGGSERUM 839 02/17/2022  ?  IGA 179 02/17/2022  ? IGMSERUM 94 02/17/2022  ? ?Lab Results  ?Component Value Date  ? TOTALPROTELP 6.4 02/17/2022  ? ALBUMINELP 3.5 02/17/2022  ? A1GS 0.1 02/17/2022  ? A2GS 0.8 02/17/2022  ? BETS 1.2 02/17/2022  ? BETA2SER 0.4 07/02/2015  ? GAMS 0.8 02/17/2022  ? MSPIKE Not Observed 02/17/2022  ? SPEI Comment 02/17/2022  ? ?  Chemistry   ?   ?Component Value Date/Time  ? NA 136 02/17/2022 1010  ? NA 141 07/06/2017 0816  ? NA 141 01/05/2017 0956  ? K 3.9 02/17/2022 1010  ? K 3.6 07/06/2017 0816  ? K 4.3 01/05/2017 0956  ? CL 101 02/17/2022 1010  ? CL 101 07/06/2017 0816  ? CO2 28 02/17/2022 1010  ? CO2 28 07/06/2017 0816  ? CO2 23 01/05/2017 0956  ? BUN 27 (H) 02/17/2022 1010  ? BUN 28 (H) 07/06/2017 0816  ? BUN 22.4 01/05/2017 0956  ? CREATININE 1.30 (H) 02/17/2022 1010  ? CREATININE 1.6 (H) 07/06/2017 0816  ? CREATININE 1.4 (H) 01/05/2017 0956  ?    ?Component Value Date/Time  ? CALCIUM 9.5 02/17/2022 1010  ? CALCIUM 9.1 07/06/2017 0816  ? CALCIUM 9.3 01/05/2017 0956  ? ALKPHOS 48 02/17/2022 1010  ? ALKPHOS 58 07/06/2017 0816  ? ALKPHOS 52 01/05/2017 0956  ? AST 12 (L) 02/17/2022 1010  ? AST 14 01/05/2017 0956  ? ALT 10 02/17/2022 1010  ? ALT 58 (H) 07/06/2017 0816  ? ALT 13 01/05/2017 0956  ? BILITOT 0.6 02/17/2022 1010  ? BILITOT 0.56 01/05/2017 0956  ?  ? ? ? ?Impression and Plan: Marcia [name] is a very pleasant 71 yo caucasian female with kappa light chain myeloma that has recurred again.  Again, the light chain level is going up. ? ?We forgot to mention that she does have breast cancer.  We have to keep in mind that we have to also monitor  her breast cancer.  Her breast cancer is stage II.  She had a very good response to neoadjuvant chemotherapy. ? ?We will have to keep her myeloma while at the forefront right now.  Hopefully, we will fi

## 2022-03-01 LAB — TYPE AND SCREEN
ABO/RH(D): AB POS
Antibody Screen: NEGATIVE

## 2022-03-02 LAB — PRETREATMENT RBC PHENOTYPE

## 2022-03-03 ENCOUNTER — Other Ambulatory Visit: Payer: Self-pay | Admitting: Hematology & Oncology

## 2022-03-03 DIAGNOSIS — Z1231 Encounter for screening mammogram for malignant neoplasm of breast: Secondary | ICD-10-CM

## 2022-03-12 NOTE — Progress Notes (Signed)
Pharmacist Chemotherapy Monitoring - Initial Assessment   ? ?Anticipated start date: 03/19/22   ? ?The following has been reviewed per standard work regarding the patient's treatment regimen: ?The patient's diagnosis, treatment plan and drug doses, and organ/hematologic function ?Lab orders and baseline tests specific to treatment regimen  ?The treatment plan start date, drug sequencing, and pre-medications ?Prior authorization status  ?Patient's documented medication list, including drug-drug interaction screen and prescriptions for anti-emetics and supportive care specific to the treatment regimen ?The drug concentrations, fluid compatibility, administration routes, and timing of the medications to be used ?The patient's access for treatment and lifetime cumulative dose history, if applicable  ?The patient's medication allergies and previous infusion related reactions, if applicable  ? ?Changes made to treatment plan:  ?N/A ? ?Follow up needed:  ?MD addiing Marcia [name] to existing treatment (Velcade)  he plans faspro with D1,8 q 21 velcade ? ? ?Marcia [name] [place], , ?03/12/2022  10:15 AM  ?

## 2022-03-19 ENCOUNTER — Inpatient Hospital Stay: Payer: Medicare Other

## 2022-03-19 ENCOUNTER — Ambulatory Visit: Payer: Medicare Other

## 2022-03-19 ENCOUNTER — Inpatient Hospital Stay: Payer: Medicare Other | Admitting: Family

## 2022-03-19 ENCOUNTER — Other Ambulatory Visit: Payer: Self-pay | Admitting: Family

## 2022-03-19 ENCOUNTER — Encounter: Payer: Self-pay | Admitting: Family

## 2022-03-19 VITALS — BP 151/65 | HR 73 | Temp 98.1°F | Resp 18 | Wt 227.0 lb

## 2022-03-19 VITALS — BP 151/65 | HR 78 | Resp 17

## 2022-03-19 DIAGNOSIS — C9000 Multiple myeloma not having achieved remission: Secondary | ICD-10-CM

## 2022-03-19 DIAGNOSIS — C50911 Malignant neoplasm of unspecified site of right female breast: Secondary | ICD-10-CM

## 2022-03-19 DIAGNOSIS — C9001 Multiple myeloma in remission: Secondary | ICD-10-CM

## 2022-03-19 DIAGNOSIS — Z5111 Encounter for antineoplastic chemotherapy: Secondary | ICD-10-CM | POA: Diagnosis not present

## 2022-03-19 LAB — CBC WITH DIFFERENTIAL (CANCER CENTER ONLY)
Abs Immature Granulocytes: 0.04 10*3/uL (ref 0.00–0.07)
Basophils Absolute: 0 10*3/uL (ref 0.0–0.1)
Basophils Relative: 1 %
Eosinophils Absolute: 0.1 10*3/uL (ref 0.0–0.5)
Eosinophils Relative: 2 %
HCT: 42.2 % (ref 36.0–46.0)
Hemoglobin: 14 g/dL (ref 12.0–15.0)
Immature Granulocytes: 1 %
Lymphocytes Relative: 38 %
Lymphs Abs: 3.4 10*3/uL (ref 0.7–4.0)
MCH: 33.1 pg (ref 26.0–34.0)
MCHC: 33.2 g/dL (ref 30.0–36.0)
MCV: 99.8 fL (ref 80.0–100.0)
Monocytes Absolute: 0.5 10*3/uL (ref 0.1–1.0)
Monocytes Relative: 6 %
Neutro Abs: 4.8 10*3/uL (ref 1.7–7.7)
Neutrophils Relative %: 52 %
Platelet Count: 195 10*3/uL (ref 150–400)
RBC: 4.23 MIL/uL (ref 3.87–5.11)
RDW: 13.3 % (ref 11.5–15.5)
WBC Count: 8.9 10*3/uL (ref 4.0–10.5)
nRBC: 0 % (ref 0.0–0.2)

## 2022-03-19 LAB — CMP (CANCER CENTER ONLY)
ALT: 10 U/L (ref 0–44)
AST: 12 U/L — ABNORMAL LOW (ref 15–41)
Albumin: 4 g/dL (ref 3.5–5.0)
Alkaline Phosphatase: 49 U/L (ref 38–126)
Anion gap: 9 (ref 5–15)
BUN: 24 mg/dL — ABNORMAL HIGH (ref 8–23)
CO2: 27 mmol/L (ref 22–32)
Calcium: 9.1 mg/dL (ref 8.9–10.3)
Chloride: 103 mmol/L (ref 98–111)
Creatinine: 1.36 mg/dL — ABNORMAL HIGH (ref 0.44–1.00)
GFR, Estimated: 41 mL/min — ABNORMAL LOW (ref >60)
Glucose, Bld: 89 mg/dL (ref 70–99)
Potassium: 4.1 mmol/L (ref 3.5–5.1)
Sodium: 139 mmol/L (ref 135–145)
Total Bilirubin: 0.6 mg/dL (ref 0.3–1.2)
Total Protein: 6.8 g/dL (ref 6.5–8.1)

## 2022-03-19 LAB — LACTATE DEHYDROGENASE: LDH: 129 U/L (ref 98–192)

## 2022-03-19 MED ORDER — DIPHENHYDRAMINE HCL 25 MG PO CAPS
50.0000 mg | ORAL_CAPSULE | Freq: Once | ORAL | Status: AC
  Administered 2022-03-19: 50 mg via ORAL
  Filled 2022-03-19: qty 2

## 2022-03-19 MED ORDER — DEXAMETHASONE 4 MG PO TABS: 20.0000 mg | ORAL_TABLET | Freq: Once | ORAL | Status: DC

## 2022-03-19 MED ORDER — ACETAMINOPHEN 325 MG PO TABS
650.0000 mg | ORAL_TABLET | Freq: Once | ORAL | Status: DC
  Filled 2022-03-19: qty 2

## 2022-03-19 MED ORDER — DARATUMUMAB-HYALURONIDASE-FIHJ 1800-30000 MG-UT/15ML ~~LOC~~ SOLN
1800.0000 mg | Freq: Once | SUBCUTANEOUS | Status: AC
  Administered 2022-03-19: 1800 mg via SUBCUTANEOUS
  Filled 2022-03-19: qty 15

## 2022-03-19 MED ORDER — BORTEZOMIB CHEMO SQ INJECTION 3.5 MG (2.5MG/ML)
1.3000 mg/m2 | Freq: Once | INTRAMUSCULAR | Status: AC
  Administered 2022-03-19: 2.75 mg via SUBCUTANEOUS
  Filled 2022-03-19: qty 1.1

## 2022-03-19 MED ORDER — MONTELUKAST SODIUM 10 MG PO TABS: 10.0000 mg | ORAL_TABLET | Freq: Every day | ORAL | 3 refills | Status: DC

## 2022-03-19 MED ORDER — SODIUM CHLORIDE 0.9% FLUSH
10.0000 mL | INTRAVENOUS | Status: DC | PRN
  Administered 2022-03-19: 10 mL

## 2022-03-19 MED ORDER — MONTELUKAST SODIUM 10 MG PO TABS
10.0000 mg | ORAL_TABLET | Freq: Once | ORAL | Status: DC
  Filled 2022-03-19: qty 1

## 2022-03-19 NOTE — Patient Instructions (Signed)
[facility] [place]  Discharge Instructions: ?Thank you for choosing [facility] to provide your oncology and hematology care.  ? ?If you have a lab appointment with the [facility], please go directly to the [facility] and check in at the registration area. ? ?Wear comfortable clothing and clothing appropriate for easy access to any Portacath or PICC line.  ? ?We strive to give you quality time with your provider. You may need to reschedule your appointment if you arrive late (15 or more minutes).  Arriving late affects you and other patients whose appointments are after yours.  Also, if you miss three or more appointments without notifying the office, you may be dismissed from the clinic at the provider?s discretion.    ?  ?For prescription refill requests, have your pharmacy contact our office and allow 72 hours for refills to be completed.   ? ?Today you received the following chemotherapy and/or immunotherapy agents Darzalex, Velcade ?    ?  ?To help prevent nausea and vomiting after your treatment, we encourage you to take your nausea medication as directed. ? ?BELOW ARE SYMPTOMS THAT SHOULD BE REPORTED IMMEDIATELY: ?*FEVER GREATER THAN 100.4 F (38 ?C) OR HIGHER ?*CHILLS OR SWEATING ?*NAUSEA AND VOMITING THAT IS NOT CONTROLLED WITH YOUR NAUSEA MEDICATION ?*UNUSUAL SHORTNESS OF BREATH ?*UNUSUAL BRUISING OR BLEEDING ?*URINARY PROBLEMS (pain or burning when urinating, or frequent urination) ?*BOWEL PROBLEMS (unusual diarrhea, constipation, pain near the anus) ?TENDERNESS IN MOUTH AND THROAT WITH OR WITHOUT PRESENCE OF ULCERS (sore throat, sores in mouth, or a toothache) ?UNUSUAL RASH, SWELLING OR PAIN  ?UNUSUAL VAGINAL DISCHARGE OR ITCHING  ? ?Items with * indicate a potential emergency and should be followed up as soon as possible or go to the Emergency Department if any problems should occur. ? ?Please show the CHEMOTHERAPY ALERT CARD or IMMUNOTHERAPY ALERT CARD at  check-in to the Emergency Department and triage nurse. ?Should you have questions after your visit or need to cancel or reschedule your appointment, please contact [facility]  [phone] and follow the prompts.  Office hours are 8:00 a.m. to 4:30 p.m. Monday - Friday. Please note that voicemails left after 4:00 p.m. may not be returned until the following business day.  We are closed weekends and major holidays. You have access to a nurse at all times for urgent questions. Please call the main number to the clinic [phone] and follow the prompts. ? ?For any non-urgent questions, you may also contact your provider using MyChart. We now offer e-Visits for anyone 69 and older to request care online for non-urgent symptoms. For details visit [email or web address]. ?  ?Also download the MyChart app! Go to the app store, search "MyChart", open the app, select [facility], and log in with your MyChart username and password. ? ?Due to Covid, a mask is required upon entering the hospital/clinic. If you do not have a mask, one will be given to you upon arrival. For doctor visits, patients may have 1 support person aged 36 or older with them. For treatment visits, patients cannot have anyone with them due to current Covid guidelines and our immunocompromised population.  ?

## 2022-03-19 NOTE — Progress Notes (Signed)
?Hematology and Oncology Follow Up Visit ? ?Marcia [name] ?[id number] ?08-26-1951 71 y.o. ?03/19/2022 ? ? 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  ?Past Therapy: ?Neoadjuvant carboplatinum/Taxotere Herceptin/Perjeta - s/p cycle 4 ?Radiation therapy to the right breast [facility]) ?Herceptin - adjuvant therapy started 08/18/2018  -- completed on 04/2019 ?  ?Current Therapy:        ?Faspro/Velcade/Decadron -- started 03/19/2022 ?Eliquis 2.5 mg po BID - started on 04/22/2019 - maintenance ?  ?Interim History:  Marcia [name] is here today for follow-up and treatment. We are now adding Faspro to her regimen due to the steady increase in her light chains.  ?She has some mild fatigue at times.  ?No issue with infection. No fever, chills, n/v, cough, rash, dizziness, SOB, chest pain, palpitations, abdominal pain or changes in bowel or bladder habits.  ?No swelling, tenderness, numbness or tingling in her extremities at this time.  ?No falls or syncope.  ?No blood loss or petechiae.  ?She has a good appetite and is doing her best to stay well hydrated. Her weight is stable at 151 lbs.  ? ?ECOG Performance Status: 1 - Symptomatic but completely ambulatory ? ?Medications:  ?Allergies as of 03/19/2022   ? ?   Reactions  ? Bactrim [sulfamethoxazole-trimethoprim] Other (See Comments)  ? ? Renal failure.  ? Heparin Other (See Comments)  ? HIT  ? Zofran [ondansetron] Shortness Of Breath  ? "felt like throat was closing"  ? Clarithromycin Diarrhea, Nausea And Vomiting  ? Latex Rash  ? Macrodantin [nitrofurantoin] Rash  ? ?  ? ?  ?Medication List  ?  ? ?  ? Accurate as of March 19, 2022 11:45 AM. If you have any questions, ask your nurse or doctor.  ?  ?  ? ?  ? ?acetaminophen 325 MG  tablet ?Commonly known as: TYLENOL ?Take 650 mg by mouth daily as needed for moderate pain or headache. ?  ?amoxicillin 500 MG capsule ?Commonly known as: AMOXIL ?Prior to dentist ?  ?dexamethasone 6 MG tablet ?Commonly known as: DECADRON ?Take 2 tablets (12 mg total) by mouth daily. Take 30 minutes prior to treatment. ?  ?Eliquis 2.5 MG Tabs tablet ?Generic drug: apixaban ?TAKE 1 TABLET BY MOUTH TWICE A DAY ?  ?famciclovir 250 MG tablet ?Commonly known as: FAMVIR ?TAKE 1 TABLET BY MOUTH EVERY DAY ?  ?feeding supplement Liqd ?Take 237 mLs by mouth 3 (three) times daily between meals. ?What changed: when to take this ?  ?folic acid 1 MG tablet ?Commonly known as: FOLVITE ?TAKE 2 TABLETS (2 MG TOTAL) BY MOUTH DAILY. ?  ?lidocaine-prilocaine cream ?Commonly known as: EMLA ?Apply 1 application topically as needed (local anesthetic). ?  ?LORazepam 0.5 MG tablet ?Commonly known as: ATIVAN ?Take 1 tablet (0.5 mg total) by mouth every 8 (eight) hours. ?  ?losartan 25 MG tablet ?Commonly known as: COZAAR ?Take 25 mg by mouth daily. ?  ?montelukast 10 MG tablet ?Commonly known as: Singulair ?Take 1 tablet (10 mg total) by mouth at bedtime. Start the Singulair on 03/14/2022 ?  ?neomycin-polymyxin b-dexamethasone 3.5-10000-0.1 Oint ?Commonly known as: MAXITROL ?Place 1 application. into both eyes 2 (two) times daily. ?  ?traMADol 50 MG tablet ?Commonly known as: ULTRAM ?Take 1 tablet (50 mg total) by mouth every 12 (  twelve) hours as needed. ?  ? ?  ? ? ?Allergies:  ?Allergies  ?Allergen Reactions  ? Bactrim [Sulfamethoxazole-Trimethoprim] Other (See Comments)  ?  ? Renal failure.  ? Heparin Other (See Comments)  ?  HIT  ? Zofran [Ondansetron] Shortness Of Breath  ?  "felt like throat was closing"  ? Clarithromycin Diarrhea and Nausea And Vomiting  ? Latex Rash  ? Macrodantin [Nitrofurantoin] Rash  ? ? ?Past Medical History, Surgical history, Social history, and Family History were reviewed and updated. ? ?Review of Systems: ?All  other 10 point review of systems is negative.  ? ?Physical Exam: ? weight is 227 lb (103 kg). Her oral temperature is 98.1 ?F (36.7 ?C). Her blood pressure is 151/65 (abnormal) and her pulse is 73. Her respiration is 18 and oxygen saturation is 100%.  ? ?Wt Readings from Last 3 Encounters:  ?03/19/22 227 lb (103 kg)  ?02/28/22 229 lb (103.9 kg)  ?02/17/22 227 lb (103 kg)  ? ? ?Ocular: Sclerae unicteric, pupils equal, round and reactive to light ?Ear-nose-throat: Oropharynx clear, dentition fair ?Lymphatic: No cervical or supraclavicular adenopathy ?Lungs no rales or rhonchi, good excursion bilaterally ?Heart regular rate and rhythm, no murmur appreciated ?Abd soft, nontender, positive bowel sounds ?MSK no focal spinal tenderness, no joint edema ?Neuro: non-focal, well-oriented, appropriate affect ?Breasts: Deferred  ? ?Lab Results  ?Component Value Date  ? WBC 8.9 03/19/2022  ? HGB 14.0 03/19/2022  ? HCT 42.2 03/19/2022  ? MCV 99.8 03/19/2022  ? PLT 195 03/19/2022  ? ?Lab Results  ?Component Value Date  ? FERRITIN 820 (H) 05/17/2018  ? IRON 272 (H) 05/17/2018  ? TIBC 295 05/17/2018  ? UIBC 23 05/17/2018  ? IRONPCTSAT 92 (H) 05/17/2018  ? ?Lab Results  ?Component Value Date  ? RETICCTPCT 1.3 03/27/2011  ? RBC 4.23 03/19/2022  ? RETICCTABS 56.4 03/27/2011  ? ?Lab Results  ?Component Value Date  ? KPAFRELGTCHN 123.2 (H) 02/17/2022  ? LAMBDASER 20.2 02/17/2022  ? KAPLAMBRATIO 6.10 (H) 02/17/2022  ? ?Lab Results  ?Component Value Date  ? IGGSERUM 839 02/17/2022  ? IGA 179 02/17/2022  ? IGMSERUM 94 02/17/2022  ? ?Lab Results  ?Component Value Date  ? TOTALPROTELP 6.4 02/17/2022  ? ALBUMINELP 3.5 02/17/2022  ? A1GS 0.1 02/17/2022  ? A2GS 0.8 02/17/2022  ? BETS 1.2 02/17/2022  ? BETA2SER 0.4 07/02/2015  ? GAMS 0.8 02/17/2022  ? MSPIKE Not Observed 02/17/2022  ? SPEI Comment 02/17/2022  ? ?  Chemistry   ?   ?Component Value Date/Time  ? NA 139 03/19/2022 1045  ? NA 141 07/06/2017 0816  ? NA 141 01/05/2017 0956  ? K 4.1  03/19/2022 1045  ? K 3.6 07/06/2017 0816  ? K 4.3 01/05/2017 0956  ? CL 103 03/19/2022 1045  ? CL 101 07/06/2017 0816  ? CO2 27 03/19/2022 1045  ? CO2 28 07/06/2017 0816  ? CO2 23 01/05/2017 0956  ? BUN 24 (H) 03/19/2022 1045  ? BUN 28 (H) 07/06/2017 0816  ? BUN 22.4 01/05/2017 0956  ? CREATININE 1.36 (H) 03/19/2022 1045  ? CREATININE 1.6 (H) 07/06/2017 0816  ? CREATININE 1.4 (H) 01/05/2017 0956  ?    ?Component Value Date/Time  ? CALCIUM 9.1 03/19/2022 1045  ? CALCIUM 9.1 07/06/2017 0816  ? CALCIUM 9.3 01/05/2017 0956  ? ALKPHOS 49 03/19/2022 1045  ? ALKPHOS 58 07/06/2017 0816  ? ALKPHOS 52 01/05/2017 0956  ? AST 12 (L) 03/19/2022 1045  ? AST 14 01/05/2017  0956  ? ALT 10 03/19/2022 1045  ? ALT 58 (H) 07/06/2017 0816  ? ALT 13 01/05/2017 0956  ? BILITOT 0.6 03/19/2022 1045  ? BILITOT 0.56 01/05/2017 0956  ?  ? ? ? ?Impression and Plan: Ms. Alviar is a very pleasant 71 yo caucasian female with kappa light chain myeloma that has recurred again.   ?We have now added Faspro to her regimen with Velcade.  ?She will start her first cycle today as planned.  ?Faspro schedule: ?Once a week for 8 weeks.  ?Once every other week for 8 weeks and then monthly.  ?Velcade schedule:  ?Weekly 3 weeks on and one week off.  ?Hopefully can move out to every other week once her light chains come down. We will coordinate with her Faspro.  ?She will take her Singulair the morning of every Faspro treatment.  ?Follow-up with MD on 04/08/2022.  ? ?Lottie Dawson, NP ?4/26/202311:45 AM ? ?

## 2022-03-20 LAB — IGG, IGA, IGM
IgA: 172 mg/dL (ref 64–422)
IgG (Immunoglobin G), Serum: 792 mg/dL (ref 586–1602)
IgM (Immunoglobulin M), Srm: 91 mg/dL (ref 26–217)

## 2022-03-20 LAB — KAPPA/LAMBDA LIGHT CHAINS
Kappa free light chain: 142.1 mg/L — ABNORMAL HIGH (ref 3.3–19.4)
Kappa, lambda light chain ratio: 7.64 — ABNORMAL HIGH (ref 0.26–1.65)
Lambda free light chains: 18.6 mg/L (ref 5.7–26.3)

## 2022-03-21 ENCOUNTER — Encounter: Payer: Self-pay | Admitting: *Deleted

## 2022-03-21 LAB — PROTEIN ELECTROPHORESIS, SERUM, WITH REFLEX
A/G Ratio: 1.3 (ref 0.7–1.7)
Albumin ELP: 3.6 g/dL (ref 2.9–4.4)
Alpha-1-Globulin: 0.2 g/dL (ref 0.0–0.4)
Alpha-2-Globulin: 0.7 g/dL (ref 0.4–1.0)
Beta Globulin: 1.1 g/dL (ref 0.7–1.3)
Gamma Globulin: 0.8 g/dL (ref 0.4–1.8)
Globulin, Total: 2.8 g/dL (ref 2.2–3.9)
Total Protein ELP: 6.4 g/dL (ref 6.0–8.5)

## 2022-03-25 ENCOUNTER — Inpatient Hospital Stay: Payer: Medicare Other | Attending: Hematology & Oncology

## 2022-03-25 ENCOUNTER — Inpatient Hospital Stay: Payer: Medicare Other

## 2022-03-25 VITALS — BP 148/72 | HR 80 | Temp 98.5°F | Resp 18

## 2022-03-25 DIAGNOSIS — C50911 Malignant neoplasm of unspecified site of right female breast: Secondary | ICD-10-CM | POA: Insufficient documentation

## 2022-03-25 DIAGNOSIS — C9001 Multiple myeloma in remission: Secondary | ICD-10-CM

## 2022-03-25 DIAGNOSIS — Z5112 Encounter for antineoplastic immunotherapy: Secondary | ICD-10-CM | POA: Diagnosis not present

## 2022-03-25 DIAGNOSIS — C9002 Multiple myeloma in relapse: Secondary | ICD-10-CM | POA: Insufficient documentation

## 2022-03-25 DIAGNOSIS — Z171 Estrogen receptor negative status [ER-]: Secondary | ICD-10-CM | POA: Insufficient documentation

## 2022-03-25 DIAGNOSIS — Z5111 Encounter for antineoplastic chemotherapy: Secondary | ICD-10-CM | POA: Insufficient documentation

## 2022-03-25 MED ORDER — BORTEZOMIB CHEMO SQ INJECTION 3.5 MG (2.5MG/ML)
1.3000 mg/m2 | Freq: Once | INTRAMUSCULAR | Status: AC
  Administered 2022-03-25: 2.75 mg via SUBCUTANEOUS
  Filled 2022-03-25: qty 1.1

## 2022-03-25 MED ORDER — DARATUMUMAB-HYALURONIDASE-FIHJ 1800-30000 MG-UT/15ML ~~LOC~~ SOLN
1800.0000 mg | Freq: Once | SUBCUTANEOUS | Status: AC
  Administered 2022-03-25: 1800 mg via SUBCUTANEOUS
  Filled 2022-03-25: qty 15

## 2022-03-25 MED ORDER — DEXAMETHASONE 4 MG PO TABS: 20.0000 mg | ORAL_TABLET | Freq: Once | ORAL | Status: DC

## 2022-03-25 MED ORDER — ACETAMINOPHEN 325 MG PO TABS: 650.0000 mg | ORAL_TABLET | Freq: Once | ORAL | Status: DC

## 2022-03-25 MED ORDER — MONTELUKAST SODIUM 10 MG PO TABS
10.0000 mg | ORAL_TABLET | Freq: Once | ORAL | Status: DC
  Filled 2022-03-25: qty 1

## 2022-03-25 MED ORDER — DIPHENHYDRAMINE HCL 25 MG PO CAPS: 50.0000 mg | ORAL_CAPSULE | Freq: Once | ORAL | Status: DC

## 2022-03-25 NOTE — Patient Instructions (Signed)
[facility] [place]  Discharge Instructions: ?Thank you for choosing [facility] to provide your oncology and hematology care.  ? ?If you have a lab appointment with the [facility], please go directly to the [facility] and check in at the registration area. ? ?Wear comfortable clothing and clothing appropriate for easy access to any Portacath or PICC line.  ? ?We strive to give you quality time with your provider. You may need to reschedule your appointment if you arrive late (15 or more minutes).  Arriving late affects you and other patients whose appointments are after yours.  Also, if you miss three or more appointments without notifying the office, you may be dismissed from the clinic at the provider?s discretion.    ?  ?For prescription refill requests, have your pharmacy contact our office and allow 72 hours for refills to be completed.   ? ?Today you received the following chemotherapy and/or immunotherapy agents Velcade and Darzalex.    ?  ?To help prevent nausea and vomiting after your treatment, we encourage you to take your nausea medication as directed. ? ?BELOW ARE SYMPTOMS THAT SHOULD BE REPORTED IMMEDIATELY: ?*FEVER GREATER THAN 100.4 F (38 ?C) OR HIGHER ?*CHILLS OR SWEATING ?*NAUSEA AND VOMITING THAT IS NOT CONTROLLED WITH YOUR NAUSEA MEDICATION ?*UNUSUAL SHORTNESS OF BREATH ?*UNUSUAL BRUISING OR BLEEDING ?*URINARY PROBLEMS (pain or burning when urinating, or frequent urination) ?*BOWEL PROBLEMS (unusual diarrhea, constipation, pain near the anus) ?TENDERNESS IN MOUTH AND THROAT WITH OR WITHOUT PRESENCE OF ULCERS (sore throat, sores in mouth, or a toothache) ?UNUSUAL RASH, SWELLING OR PAIN  ?UNUSUAL VAGINAL DISCHARGE OR ITCHING  ? ?Items with * indicate a potential emergency and should be followed up as soon as possible or go to the Emergency Department if any problems should occur. ? ?Please show the CHEMOTHERAPY ALERT CARD or IMMUNOTHERAPY ALERT CARD at  check-in to the Emergency Department and triage nurse. ?Should you have questions after your visit or need to cancel or reschedule your appointment, please contact [facility]  [phone] and follow the prompts.  Office hours are 8:00 a.m. to 4:30 p.m. Monday - Friday. Please note that voicemails left after 4:00 p.m. may not be returned until the following business day.  We are closed weekends and major holidays. You have access to a nurse at all times for urgent questions. Please call the main number to the clinic [phone] and follow the prompts. ? ?For any non-urgent questions, you may also contact your provider using MyChart. We now offer e-Visits for anyone 60 and older to request care online for non-urgent symptoms. For details visit [email or web address]. ?  ?Also download the MyChart app! Go to the app store, search "MyChart", open the app, select [facility], and log in with your MyChart username and password. ? ?Due to Covid, a mask is required upon entering the hospital/clinic. If you do not have a mask, one will be given to you upon arrival. For doctor visits, patients may have 1 support person aged 29 or older with them. For treatment visits, patients cannot have anyone with them due to current Covid guidelines and our immunocompromised population.  ?

## 2022-03-25 NOTE — Progress Notes (Signed)
Ok to treat today with labs from 03/19/22 per Dr [name] ?

## 2022-03-27 LAB — UPEP/UIFE/LIGHT CHAINS/TP, 24-HR UR
% BETA, Urine: 0 %
ALPHA 1 URINE: 0 %
Albumin, U: 100 %
Alpha 2, Urine: 0 %
Free Kappa Lt Chains,Ur: 4.44 mg/L (ref 1.17–86.46)
Free Kappa/Lambda Ratio: 4.99 (ref 1.83–14.26)
Free Lambda Lt Chains,Ur: 0.89 mg/L (ref 0.27–15.21)
GAMMA GLOBULIN URINE: 0 %
Total Protein, Urine-Ur/day: 96 mg/24 hr (ref 30–150)
Total Protein, Urine: 9.1 mg/dL
Total Volume: 1050

## 2022-04-01 ENCOUNTER — Inpatient Hospital Stay: Payer: Medicare Other

## 2022-04-01 VITALS — BP 146/67 | HR 74 | Temp 98.0°F | Resp 18

## 2022-04-01 DIAGNOSIS — C50911 Malignant neoplasm of unspecified site of right female breast: Secondary | ICD-10-CM

## 2022-04-01 DIAGNOSIS — C9001 Multiple myeloma in remission: Secondary | ICD-10-CM

## 2022-04-01 DIAGNOSIS — Z5111 Encounter for antineoplastic chemotherapy: Secondary | ICD-10-CM | POA: Diagnosis not present

## 2022-04-01 DIAGNOSIS — Z95828 Presence of other vascular implants and grafts: Secondary | ICD-10-CM

## 2022-04-01 LAB — CBC WITH DIFFERENTIAL (CANCER CENTER ONLY)
Abs Immature Granulocytes: 0.02 10*3/uL (ref 0.00–0.07)
Basophils Absolute: 0 10*3/uL (ref 0.0–0.1)
Basophils Relative: 0 %
Eosinophils Absolute: 0.1 10*3/uL (ref 0.0–0.5)
Eosinophils Relative: 1 %
HCT: 37.8 % (ref 36.0–46.0)
Hemoglobin: 12.7 g/dL (ref 12.0–15.0)
Immature Granulocytes: 0 %
Lymphocytes Relative: 30 %
Lymphs Abs: 2.4 10*3/uL (ref 0.7–4.0)
MCH: 33.3 pg (ref 26.0–34.0)
MCHC: 33.6 g/dL (ref 30.0–36.0)
MCV: 99.2 fL (ref 80.0–100.0)
Monocytes Absolute: 0.5 10*3/uL (ref 0.1–1.0)
Monocytes Relative: 6 %
Neutro Abs: 4.9 10*3/uL (ref 1.7–7.7)
Neutrophils Relative %: 63 %
Platelet Count: 180 10*3/uL (ref 150–400)
RBC: 3.81 MIL/uL — ABNORMAL LOW (ref 3.87–5.11)
RDW: 13.8 % (ref 11.5–15.5)
WBC Count: 7.8 10*3/uL (ref 4.0–10.5)
nRBC: 0 % (ref 0.0–0.2)

## 2022-04-01 MED ORDER — BORTEZOMIB CHEMO SQ INJECTION 3.5 MG (2.5MG/ML)
1.3000 mg/m2 | Freq: Once | INTRAMUSCULAR | Status: AC
  Administered 2022-04-01: 2.75 mg via SUBCUTANEOUS
  Filled 2022-04-01: qty 1.1

## 2022-04-01 MED ORDER — DEXAMETHASONE 4 MG PO TABS: 20.0000 mg | ORAL_TABLET | Freq: Once | ORAL | Status: DC

## 2022-04-01 MED ORDER — DARATUMUMAB-HYALURONIDASE-FIHJ 1800-30000 MG-UT/15ML ~~LOC~~ SOLN
1800.0000 mg | Freq: Once | SUBCUTANEOUS | Status: AC
  Administered 2022-04-01: 1800 mg via SUBCUTANEOUS
  Filled 2022-04-01: qty 15

## 2022-04-01 MED ORDER — ACETAMINOPHEN 325 MG PO TABS: 650.0000 mg | ORAL_TABLET | Freq: Once | ORAL | Status: DC

## 2022-04-01 MED ORDER — SODIUM CHLORIDE 0.9% FLUSH
10.0000 mL | INTRAVENOUS | Status: DC | PRN
  Administered 2022-04-01: 10 mL via INTRAVENOUS

## 2022-04-01 MED ORDER — DIPHENHYDRAMINE HCL 25 MG PO CAPS: 50.0000 mg | ORAL_CAPSULE | Freq: Once | ORAL | Status: DC

## 2022-04-01 MED ORDER — MONTELUKAST SODIUM 10 MG PO TABS
10.0000 mg | ORAL_TABLET | Freq: Once | ORAL | Status: DC
  Filled 2022-04-01: qty 1

## 2022-04-01 NOTE — Progress Notes (Signed)
Changing careplan to Velcade 3 weeks on, 1 week off per Dr. Antonieta Pert instructions. ? ?

## 2022-04-07 ENCOUNTER — Ambulatory Visit
Admission: RE | Admit: 2022-04-07 | Discharge: 2022-04-07 | Disposition: A | Discharge status: Home / Self Care | Payer: Medicare Other | Source: Ambulatory Visit | Attending: Hematology & Oncology | Admitting: Hematology & Oncology

## 2022-04-07 DIAGNOSIS — Z1231 Encounter for screening mammogram for malignant neoplasm of breast: Secondary | ICD-10-CM

## 2022-04-08 ENCOUNTER — Inpatient Hospital Stay: Payer: Medicare Other

## 2022-04-08 ENCOUNTER — Inpatient Hospital Stay: Payer: Medicare Other | Admitting: Hematology & Oncology

## 2022-04-08 ENCOUNTER — Encounter: Payer: Self-pay | Admitting: Hematology & Oncology

## 2022-04-08 VITALS — BP 145/76 | HR 80

## 2022-04-08 VITALS — BP 132/66 | HR 82 | Temp 98.2°F | Resp 18 | Wt 229.0 lb

## 2022-04-08 DIAGNOSIS — C9001 Multiple myeloma in remission: Secondary | ICD-10-CM

## 2022-04-08 DIAGNOSIS — C50911 Malignant neoplasm of unspecified site of right female breast: Secondary | ICD-10-CM

## 2022-04-08 DIAGNOSIS — Z5111 Encounter for antineoplastic chemotherapy: Secondary | ICD-10-CM | POA: Diagnosis not present

## 2022-04-08 DIAGNOSIS — I824Z3 Acute embolism and thrombosis of unspecified deep veins of distal lower extremity, bilateral: Secondary | ICD-10-CM

## 2022-04-08 DIAGNOSIS — I2601 Septic pulmonary embolism with acute cor pulmonale: Secondary | ICD-10-CM

## 2022-04-08 LAB — CMP (CANCER CENTER ONLY)
ALT: 14 U/L (ref 0–44)
AST: 10 U/L — ABNORMAL LOW (ref 15–41)
Albumin: 4.1 g/dL (ref 3.5–5.0)
Alkaline Phosphatase: 49 U/L (ref 38–126)
Anion gap: 10 (ref 5–15)
BUN: 31 mg/dL — ABNORMAL HIGH (ref 8–23)
CO2: 26 mmol/L (ref 22–32)
Calcium: 9.4 mg/dL (ref 8.9–10.3)
Chloride: 103 mmol/L (ref 98–111)
Creatinine: 1.35 mg/dL — ABNORMAL HIGH (ref 0.44–1.00)
GFR, Estimated: 42 mL/min — ABNORMAL LOW (ref >60)
Glucose, Bld: 105 mg/dL — ABNORMAL HIGH (ref 70–99)
Potassium: 4.2 mmol/L (ref 3.5–5.1)
Sodium: 139 mmol/L (ref 135–145)
Total Bilirubin: 0.8 mg/dL (ref 0.3–1.2)
Total Protein: 6.6 g/dL (ref 6.5–8.1)

## 2022-04-08 LAB — CBC WITH DIFFERENTIAL (CANCER CENTER ONLY)
Abs Immature Granulocytes: 0.04 10*3/uL (ref 0.00–0.07)
Basophils Absolute: 0 10*3/uL (ref 0.0–0.1)
Basophils Relative: 0 %
Eosinophils Absolute: 0 10*3/uL (ref 0.0–0.5)
Eosinophils Relative: 0 %
HCT: 41.6 % (ref 36.0–46.0)
Hemoglobin: 13.7 g/dL (ref 12.0–15.0)
Immature Granulocytes: 1 %
Lymphocytes Relative: 13 %
Lymphs Abs: 1.1 10*3/uL (ref 0.7–4.0)
MCH: 32.9 pg (ref 26.0–34.0)
MCHC: 32.9 g/dL (ref 30.0–36.0)
MCV: 100 fL (ref 80.0–100.0)
Monocytes Absolute: 0.1 10*3/uL (ref 0.1–1.0)
Monocytes Relative: 1 %
Neutro Abs: 7.1 10*3/uL (ref 1.7–7.7)
Neutrophils Relative %: 85 %
Platelet Count: 132 10*3/uL — ABNORMAL LOW (ref 150–400)
RBC: 4.16 MIL/uL (ref 3.87–5.11)
RDW: 14.2 % (ref 11.5–15.5)
WBC Count: 8.4 10*3/uL (ref 4.0–10.5)
nRBC: 0 % (ref 0.0–0.2)

## 2022-04-08 LAB — LACTATE DEHYDROGENASE: LDH: 134 U/L (ref 98–192)

## 2022-04-08 MED ORDER — SODIUM CHLORIDE 0.9% FLUSH
10.0000 mL | Freq: Once | INTRAVENOUS | Status: AC
  Administered 2022-04-08: 10 mL via INTRAVENOUS

## 2022-04-08 MED ORDER — DIPHENHYDRAMINE HCL 25 MG PO CAPS
50.0000 mg | ORAL_CAPSULE | Freq: Once | ORAL | Status: DC
  Filled 2022-04-08: qty 2

## 2022-04-08 MED ORDER — DARATUMUMAB-HYALURONIDASE-FIHJ 1800-30000 MG-UT/15ML ~~LOC~~ SOLN
1800.0000 mg | Freq: Once | SUBCUTANEOUS | Status: AC
  Administered 2022-04-08: 1800 mg via SUBCUTANEOUS
  Filled 2022-04-08: qty 15

## 2022-04-08 MED ORDER — DEXAMETHASONE 4 MG PO TABS
20.0000 mg | ORAL_TABLET | Freq: Once | ORAL | Status: DC
  Filled 2022-04-08: qty 5

## 2022-04-08 MED ORDER — ACETAMINOPHEN 325 MG PO TABS
650.0000 mg | ORAL_TABLET | Freq: Once | ORAL | Status: DC
  Filled 2022-04-08: qty 2

## 2022-04-08 NOTE — Progress Notes (Signed)
?Hematology and Oncology Follow Up Visit ? ?Marcia [name] ?[id number] ?1951-02-15 71 y.o. ?04/08/2022 ? ? 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  ?Past Therapy: ?Neoadjuvant carboplatinum/Taxotere Herceptin/Perjeta - s/p cycle 4 ?Radiation therapy to the right breast [facility]) ?Herceptin - adjuvant therapy started 08/18/2018  -- completed on 04/2019 ?  ?Current Therapy:        ?Faspro/Velcade/Decadron -- start cycle #1 on 03/19/2022 ?Eliquis 2.5 mg po BID - started on 04/22/2019 - maintenance ?  ?Interim History:  Marcia [name] is here today for follow-up.  She is doing okay.  However, noted that the Kappa light chain has been slowly trending upward.  Last time that we checked back in April the Kappa light chain was 14.3 mg/dL. ? ?If I find that the Kappa light chain is further elevated, her [place] have to switch out the Velcade and give her Kyprolis. ? ?She has had no problems at home.  She is trying to stay active.  She has had no problems with bowels or bladder.  She had a wonderful Mother's Day weekend.  Her son came up from [place]. ? ?She has had no cough.  She has had no nausea or vomiting.  She has had no headache. ? ? ?She does have the history of breast cancer.  She has done well with this.  It has probably been over 4 years that she has had chemotherapy. ? ?She has had no rashes.  There has been no leg swelling. ? ?Overall, I would say performance status is probably ECOG 1.   ? ? ?Medications:  ?Allergies as of 04/08/2022   ? ?   Reactions  ? Bactrim [sulfamethoxazole-trimethoprim] Other (See Comments)  ? ? Renal failure.  ? Heparin Other (See Comments)  ? HIT  ? Zofran [ondansetron] Shortness Of Breath  ? "felt like throat was closing"  ? Clarithromycin  Diarrhea, Nausea And Vomiting  ? Latex Rash  ? Macrodantin [nitrofurantoin] Rash  ? ?  ? ?  ?Medication List  ?  ? ?  ? Accurate as of Apr 08, 2022 10:27 AM. If you have any questions, ask your nurse or doctor.  ?  ?  ? ?  ? ?STOP taking these medications   ? ?neomycin-polymyxin b-dexamethasone 3.5-10000-0.1 Oint ?Commonly known as: MAXITROL ?Stopped by: [name], MD ?  ?traMADol 50 MG tablet ?Commonly known as: ULTRAM ?Stopped by: [name], MD ?  ? ?  ? ?TAKE these medications   ? ?acetaminophen 325 MG tablet ?Commonly known as: TYLENOL ?Take 650 mg by mouth daily as needed for moderate pain or headache. ?  ?amoxicillin 500 MG capsule ?Commonly known as: AMOXIL ?Prior to dentist ?  ?dexamethasone 6 MG tablet ?Commonly known as: DECADRON ?Take 2 tablets (12 mg total) by mouth daily. Take 30 minutes prior to treatment. ?  ?Eliquis 2.5 MG Tabs tablet ?Generic drug: apixaban ?TAKE 1 TABLET BY MOUTH TWICE A DAY ?  ?famciclovir 250 MG tablet ?Commonly known as: FAMVIR ?TAKE 1 TABLET BY MOUTH EVERY DAY ?  ?feeding supplement Liqd ?Take 237 mLs by mouth 3 (three) times daily between meals. ?What changed: when to take this ?  ?folic acid 1 MG tablet ?Commonly known as: FOLVITE ?TAKE 2 TABLETS (2 MG TOTAL) BY MOUTH DAILY. ?  ?  lidocaine-prilocaine cream ?Commonly known as: EMLA ?Apply 1 application topically as needed (local anesthetic). ?  ?LORazepam 0.5 MG tablet ?Commonly known as: ATIVAN ?Take 1 tablet (0.5 mg total) by mouth every 8 (eight) hours. ?  ?losartan 25 MG tablet ?Commonly known as: COZAAR ?Take 25 mg by mouth daily. ?  ?montelukast 10 MG tablet ?Commonly known as: Singulair ?Take 1 tablet (10 mg total) by mouth at bedtime. Start the Singulair on 03/14/2022 ?  ? ?  ? ? ?Allergies:  ?Allergies  ?Allergen Reactions  ? Bactrim [Sulfamethoxazole-Trimethoprim] Other (See Comments)  ?  ? Renal failure.  ? Heparin Other (See Comments)  ?  HIT  ? Zofran [Ondansetron] Shortness Of Breath  ?  "felt like  throat was closing"  ? Clarithromycin Diarrhea and Nausea And Vomiting  ? Latex Rash  ? Macrodantin [Nitrofurantoin] Rash  ? ? ?Past Medical History, Surgical history, Social history, and Family History were reviewed and updated. ? ?Review of Systems: ?Review of Systems  ?Constitutional: Negative.   ?HENT: Negative.    ?Eyes: Negative.   ?Respiratory: Negative.    ?Cardiovascular: Negative.   ?Gastrointestinal: Negative.   ?Genitourinary: Negative.   ?Musculoskeletal: Negative.   ?Skin: Negative.   ?Neurological: Negative.   ?Endo/Heme/Allergies: Negative.   ?Psychiatric/Behavioral: Negative.    ? ? ?Physical Exam: ? weight is 229 lb (103.9 kg). Her oral temperature is 98.2 ?F (36.8 ?C). Her blood pressure is 132/66 and her pulse is 82. Her respiration is 18 and oxygen saturation is 96%.  ? ?Wt Readings from Last 3 Encounters:  ?04/08/22 229 lb (103.9 kg)  ?03/19/22 227 lb (103 kg)  ?02/28/22 229 lb (103.9 kg)  ? ? ?Physical Exam ?Vitals reviewed.  ?HENT:  ?   Head: Normocephalic and atraumatic.  ?Eyes:  ?   Pupils: Pupils are equal, round, and reactive to light.  ?Cardiovascular:  ?   Rate and Rhythm: Normal rate and regular rhythm.  ?   Heart sounds: Normal heart sounds.  ?Pulmonary:  ?   Effort: Pulmonary effort is normal.  ?   Breath sounds: Normal breath sounds.  ?Abdominal:  ?   General: Bowel sounds are normal.  ?   Palpations: Abdomen is soft.  ?Musculoskeletal:     ?   General: No tenderness or deformity. Normal range of motion.  ?   Cervical back: Normal range of motion.  ?Lymphadenopathy:  ?   Cervical: No cervical adenopathy.  ?Skin: ?   General: Skin is warm and dry.  ?   Findings: No erythema or rash.  ?Neurological:  ?   Mental Status: She is alert and oriented to person, place, and time.  ?Psychiatric:     ?   Behavior: Behavior normal.     ?   Thought Content: Thought content normal.     ?   Judgment: Judgment normal.  ? ? ? ?Lab Results  ?Component Value Date  ? WBC 8.4 04/08/2022  ? HGB 13.7  04/08/2022  ? HCT 41.6 04/08/2022  ? MCV 100.0 04/08/2022  ? PLT 132 (L) 04/08/2022  ? ?Lab Results  ?Component Value Date  ? FERRITIN 820 (H) 05/17/2018  ? IRON 272 (H) 05/17/2018  ? TIBC 295 05/17/2018  ? UIBC 23 05/17/2018  ? IRONPCTSAT 92 (H) 05/17/2018  ? ?Lab Results  ?Component Value Date  ? RETICCTPCT 1.3 03/27/2011  ? RBC 4.16 04/08/2022  ? RETICCTABS 56.4 03/27/2011  ? ?Lab Results  ?Component Value Date  ? KPAFRELGTCHN 142.1 (H) 03/19/2022  ?  LAMBDASER 18.6 03/19/2022  ? KAPLAMBRATIO 4.99 03/25/2022  ? ?Lab Results  ?Component Value Date  ? IGGSERUM 792 03/19/2022  ? IGA 172 03/19/2022  ? IGMSERUM 91 03/19/2022  ? ?Lab Results  ?Component Value Date  ? TOTALPROTELP 6.4 03/19/2022  ? ALBUMINELP 3.6 03/19/2022  ? A1GS 0.2 03/19/2022  ? A2GS 0.7 03/19/2022  ? BETS 1.1 03/19/2022  ? BETA2SER 0.4 07/02/2015  ? GAMS 0.8 03/19/2022  ? MSPIKE Not Observed 03/19/2022  ? SPEI Comment 02/17/2022  ? ?  Chemistry   ?   ?Component Value Date/Time  ? NA 139 04/08/2022 0925  ? NA 141 07/06/2017 0816  ? NA 141 01/05/2017 0956  ? K 4.2 04/08/2022 0925  ? K 3.6 07/06/2017 0816  ? K 4.3 01/05/2017 0956  ? CL 103 04/08/2022 0925  ? CL 101 07/06/2017 0816  ? CO2 26 04/08/2022 0925  ? CO2 28 07/06/2017 0816  ? CO2 23 01/05/2017 0956  ? BUN 31 (H) 04/08/2022 0925  ? BUN 28 (H) 07/06/2017 0816  ? BUN 22.4 01/05/2017 0956  ? CREATININE 1.35 (H) 04/08/2022 0925  ? CREATININE 1.6 (H) 07/06/2017 0816  ? CREATININE 1.4 (H) 01/05/2017 0956  ?    ?Component Value Date/Time  ? CALCIUM 9.4 04/08/2022 0925  ? CALCIUM 9.1 07/06/2017 0816  ? CALCIUM 9.3 01/05/2017 0956  ? ALKPHOS 49 04/08/2022 0925  ? ALKPHOS 58 07/06/2017 0816  ? ALKPHOS 52 01/05/2017 0956  ? AST 10 (L) 04/08/2022 0925  ? AST 14 01/05/2017 0956  ? ALT 14 04/08/2022 0925  ? ALT 58 (H) 07/06/2017 0816  ? ALT 13 01/05/2017 0956  ? BILITOT 0.8 04/08/2022 0925  ? BILITOT 0.56 01/05/2017 0956  ?  ? ? ? ?Impression and Plan: Marcia [name] is a very pleasant 71 yo caucasian female  with kappa light chain myeloma that has recurred again.  Again, the kappa light chain level has been going up.  Again, we will have to see what the Kappa light chain is now.  If it is still elevated further, then

## 2022-04-08 NOTE — Patient Instructions (Signed)

## 2022-04-09 LAB — IGG, IGA, IGM
IgA: 33 mg/dL — ABNORMAL LOW (ref 64–422)
IgG (Immunoglobin G), Serum: 543 mg/dL — ABNORMAL LOW (ref 586–1602)
IgM (Immunoglobulin M), Srm: 44 mg/dL (ref 26–217)

## 2022-04-09 LAB — KAPPA/LAMBDA LIGHT CHAINS
Kappa free light chain: 45 mg/L — ABNORMAL HIGH (ref 3.3–19.4)
Kappa, lambda light chain ratio: 4.59 — ABNORMAL HIGH (ref 0.26–1.65)
Lambda free light chains: 9.8 mg/L (ref 5.7–26.3)

## 2022-04-11 LAB — PROTEIN ELECTROPHORESIS, SERUM
A/G Ratio: 1.3 (ref 0.7–1.7)
Albumin ELP: 3.4 g/dL (ref 2.9–4.4)
Alpha-1-Globulin: 0.2 g/dL (ref 0.0–0.4)
Alpha-2-Globulin: 0.8 g/dL (ref 0.4–1.0)
Beta Globulin: 1.1 g/dL (ref 0.7–1.3)
Gamma Globulin: 0.6 g/dL (ref 0.4–1.8)
Globulin, Total: 2.7 g/dL (ref 2.2–3.9)
Total Protein ELP: 6.1 g/dL (ref 6.0–8.5)

## 2022-04-15 ENCOUNTER — Inpatient Hospital Stay: Payer: Medicare Other

## 2022-04-15 VITALS — BP 110/61 | HR 78 | Temp 98.3°F | Resp 17

## 2022-04-15 DIAGNOSIS — Z95828 Presence of other vascular implants and grafts: Secondary | ICD-10-CM

## 2022-04-15 DIAGNOSIS — C9000 Multiple myeloma not having achieved remission: Secondary | ICD-10-CM

## 2022-04-15 DIAGNOSIS — Z5111 Encounter for antineoplastic chemotherapy: Secondary | ICD-10-CM | POA: Diagnosis not present

## 2022-04-15 DIAGNOSIS — C50911 Malignant neoplasm of unspecified site of right female breast: Secondary | ICD-10-CM

## 2022-04-15 DIAGNOSIS — C9001 Multiple myeloma in remission: Secondary | ICD-10-CM

## 2022-04-15 LAB — CMP (CANCER CENTER ONLY)
ALT: 15 U/L (ref 0–44)
AST: 14 U/L — ABNORMAL LOW (ref 15–41)
Albumin: 4 g/dL (ref 3.5–5.0)
Alkaline Phosphatase: 51 U/L (ref 38–126)
Anion gap: 9 (ref 5–15)
BUN: 27 mg/dL — ABNORMAL HIGH (ref 8–23)
CO2: 27 mmol/L (ref 22–32)
Calcium: 9.5 mg/dL (ref 8.9–10.3)
Chloride: 102 mmol/L (ref 98–111)
Creatinine: 1.38 mg/dL — ABNORMAL HIGH (ref 0.44–1.00)
GFR, Estimated: 41 mL/min — ABNORMAL LOW (ref >60)
Glucose, Bld: 99 mg/dL (ref 70–99)
Potassium: 4.1 mmol/L (ref 3.5–5.1)
Sodium: 138 mmol/L (ref 135–145)
Total Bilirubin: 0.6 mg/dL (ref 0.3–1.2)
Total Protein: 6.3 g/dL — ABNORMAL LOW (ref 6.5–8.1)

## 2022-04-15 LAB — CBC WITH DIFFERENTIAL (CANCER CENTER ONLY)
Abs Immature Granulocytes: 0.02 10*3/uL (ref 0.00–0.07)
Basophils Absolute: 0 10*3/uL (ref 0.0–0.1)
Basophils Relative: 0 %
Eosinophils Absolute: 0 10*3/uL (ref 0.0–0.5)
Eosinophils Relative: 1 %
HCT: 41 % (ref 36.0–46.0)
Hemoglobin: 13.5 g/dL (ref 12.0–15.0)
Immature Granulocytes: 0 %
Lymphocytes Relative: 37 %
Lymphs Abs: 2.4 10*3/uL (ref 0.7–4.0)
MCH: 32.8 pg (ref 26.0–34.0)
MCHC: 32.9 g/dL (ref 30.0–36.0)
MCV: 99.8 fL (ref 80.0–100.0)
Monocytes Absolute: 0.4 10*3/uL (ref 0.1–1.0)
Monocytes Relative: 6 %
Neutro Abs: 3.6 10*3/uL (ref 1.7–7.7)
Neutrophils Relative %: 56 %
Platelet Count: 171 10*3/uL (ref 150–400)
RBC: 4.11 MIL/uL (ref 3.87–5.11)
RDW: 14.1 % (ref 11.5–15.5)
WBC Count: 6.5 10*3/uL (ref 4.0–10.5)
nRBC: 0 % (ref 0.0–0.2)

## 2022-04-15 MED ORDER — SODIUM CHLORIDE 0.9% FLUSH
10.0000 mL | Freq: Once | INTRAVENOUS | Status: AC
  Administered 2022-04-15: 10 mL via INTRAVENOUS

## 2022-04-15 MED ORDER — DIPHENHYDRAMINE HCL 25 MG PO CAPS: 50.0000 mg | ORAL_CAPSULE | Freq: Once | ORAL | Status: DC

## 2022-04-15 MED ORDER — DARATUMUMAB-HYALURONIDASE-FIHJ 1800-30000 MG-UT/15ML ~~LOC~~ SOLN
1800.0000 mg | Freq: Once | SUBCUTANEOUS | Status: AC
  Administered 2022-04-15: 1800 mg via SUBCUTANEOUS
  Filled 2022-04-15: qty 15

## 2022-04-15 MED ORDER — ACETAMINOPHEN 325 MG PO TABS: 650.0000 mg | ORAL_TABLET | Freq: Once | ORAL | Status: DC

## 2022-04-15 MED ORDER — DEXAMETHASONE 4 MG PO TABS: 20.0000 mg | ORAL_TABLET | Freq: Once | ORAL | Status: DC

## 2022-04-15 MED ORDER — BORTEZOMIB CHEMO SQ INJECTION 3.5 MG (2.5MG/ML)
1.3000 mg/m2 | Freq: Once | INTRAMUSCULAR | Status: AC
  Administered 2022-04-15: 2.75 mg via SUBCUTANEOUS
  Filled 2022-04-15: qty 1.1

## 2022-04-15 NOTE — Patient Instructions (Signed)

## 2022-04-15 NOTE — Patient Instructions (Signed)
[facility] [place]  Discharge Instructions: Thank you for choosing [facility] to provide your oncology and hematology care.   If you have a lab appointment with the [facility], please go directly to the [facility] and check in at the registration area.  Wear comfortable clothing and clothing appropriate for easy access to any Portacath or PICC line.   We strive to give you quality time with your provider. You may need to reschedule your appointment if you arrive late (15 or more minutes).  Arriving late affects you and other patients whose appointments are after yours.  Also, if you miss three or more appointments without notifying the office, you may be dismissed from the clinic at the provider's discretion.      For prescription refill requests, have your pharmacy contact our office and allow 72 hours for refills to be completed.    Today you received the following chemotherapy and/or immunotherapy agents Velcade and Darzalex.      To help prevent nausea and vomiting after your treatment, we encourage you to take your nausea medication as directed.  BELOW ARE SYMPTOMS THAT SHOULD BE REPORTED IMMEDIATELY: *FEVER GREATER THAN 100.4 F (38 C) OR HIGHER *CHILLS OR SWEATING *NAUSEA AND VOMITING THAT IS NOT CONTROLLED WITH YOUR NAUSEA MEDICATION *UNUSUAL SHORTNESS OF BREATH *UNUSUAL BRUISING OR BLEEDING *URINARY PROBLEMS (pain or burning when urinating, or frequent urination) *BOWEL PROBLEMS (unusual diarrhea, constipation, pain near the anus) TENDERNESS IN MOUTH AND THROAT WITH OR WITHOUT PRESENCE OF ULCERS (sore throat, sores in mouth, or a toothache) UNUSUAL RASH, SWELLING OR PAIN  UNUSUAL VAGINAL DISCHARGE OR ITCHING   Items with * indicate a potential emergency and should be followed up as soon as possible or go to the Emergency Department if any problems should occur.  Please show the CHEMOTHERAPY ALERT CARD or IMMUNOTHERAPY ALERT CARD at  check-in to the Emergency Department and triage nurse. Should you have questions after your visit or need to cancel or reschedule your appointment, please contact [facility]  [phone] and follow the prompts.  Office hours are 8:00 a.m. to 4:30 p.m. Monday - Friday. Please note that voicemails left after 4:00 p.m. may not be returned until the following business day.  We are closed weekends and major holidays. You have access to a nurse at all times for urgent questions. Please call the main number to the clinic [phone] and follow the prompts.  For any non-urgent questions, you may also contact your provider using MyChart. We now offer e-Visits for anyone 62 and older to request care online for non-urgent symptoms. For details visit [email or web address].   Also download the MyChart app! Go to the app store, search "MyChart", open the app, select [facility], and log in with your MyChart username and password.  Due to Covid, a mask is required upon entering the hospital/clinic. If you do not have a mask, one will be given to you upon arrival. For doctor visits, patients may have 1 support person aged 47 or older with them. For treatment visits, patients cannot have anyone with them due to current Covid guidelines and our immunocompromised population.

## 2022-04-22 ENCOUNTER — Inpatient Hospital Stay: Payer: Medicare Other

## 2022-04-29 ENCOUNTER — Inpatient Hospital Stay: Payer: Medicare Other

## 2022-04-29 ENCOUNTER — Inpatient Hospital Stay: Payer: Medicare Other | Attending: Hematology & Oncology

## 2022-04-29 VITALS — BP 121/72 | HR 80

## 2022-04-29 DIAGNOSIS — Z5111 Encounter for antineoplastic chemotherapy: Secondary | ICD-10-CM | POA: Diagnosis present

## 2022-04-29 DIAGNOSIS — Z5112 Encounter for antineoplastic immunotherapy: Secondary | ICD-10-CM | POA: Insufficient documentation

## 2022-04-29 DIAGNOSIS — C9000 Multiple myeloma not having achieved remission: Secondary | ICD-10-CM | POA: Insufficient documentation

## 2022-04-29 DIAGNOSIS — C50911 Malignant neoplasm of unspecified site of right female breast: Secondary | ICD-10-CM

## 2022-04-29 DIAGNOSIS — C9001 Multiple myeloma in remission: Secondary | ICD-10-CM

## 2022-04-29 LAB — CMP (CANCER CENTER ONLY)
ALT: 19 U/L (ref 0–44)
AST: 16 U/L (ref 15–41)
Albumin: 4 g/dL (ref 3.5–5.0)
Alkaline Phosphatase: 56 U/L (ref 38–126)
Anion gap: 8 (ref 5–15)
BUN: 26 mg/dL — ABNORMAL HIGH (ref 8–23)
CO2: 26 mmol/L (ref 22–32)
Calcium: 8.9 mg/dL (ref 8.9–10.3)
Chloride: 105 mmol/L (ref 98–111)
Creatinine: 1.34 mg/dL — ABNORMAL HIGH (ref 0.44–1.00)
GFR, Estimated: 42 mL/min — ABNORMAL LOW (ref >60)
Glucose, Bld: 93 mg/dL (ref 70–99)
Potassium: 3.9 mmol/L (ref 3.5–5.1)
Sodium: 139 mmol/L (ref 135–145)
Total Bilirubin: 0.7 mg/dL (ref 0.3–1.2)
Total Protein: 6 g/dL — ABNORMAL LOW (ref 6.5–8.1)

## 2022-04-29 LAB — CBC WITH DIFFERENTIAL (CANCER CENTER ONLY)
Abs Immature Granulocytes: 0.02 10*3/uL (ref 0.00–0.07)
Basophils Absolute: 0 10*3/uL (ref 0.0–0.1)
Basophils Relative: 0 %
Eosinophils Absolute: 0 10*3/uL (ref 0.0–0.5)
Eosinophils Relative: 1 %
HCT: 40.5 % (ref 36.0–46.0)
Hemoglobin: 13.4 g/dL (ref 12.0–15.0)
Immature Granulocytes: 0 %
Lymphocytes Relative: 29 %
Lymphs Abs: 2 10*3/uL (ref 0.7–4.0)
MCH: 32.8 pg (ref 26.0–34.0)
MCHC: 33.1 g/dL (ref 30.0–36.0)
MCV: 99.3 fL (ref 80.0–100.0)
Monocytes Absolute: 0.4 10*3/uL (ref 0.1–1.0)
Monocytes Relative: 6 %
Neutro Abs: 4.5 10*3/uL (ref 1.7–7.7)
Neutrophils Relative %: 64 %
Platelet Count: 153 10*3/uL (ref 150–400)
RBC: 4.08 MIL/uL (ref 3.87–5.11)
RDW: 14 % (ref 11.5–15.5)
WBC Count: 7 10*3/uL (ref 4.0–10.5)
nRBC: 0 % (ref 0.0–0.2)

## 2022-04-29 MED ORDER — DARATUMUMAB-HYALURONIDASE-FIHJ 1800-30000 MG-UT/15ML ~~LOC~~ SOLN
1800.0000 mg | Freq: Once | SUBCUTANEOUS | Status: AC
  Administered 2022-04-29: 1800 mg via SUBCUTANEOUS
  Filled 2022-04-29: qty 15

## 2022-04-29 MED ORDER — DIPHENHYDRAMINE HCL 25 MG PO CAPS: 50.0000 mg | ORAL_CAPSULE | Freq: Once | ORAL | Status: DC

## 2022-04-29 MED ORDER — ACETAMINOPHEN 325 MG PO TABS: 650.0000 mg | ORAL_TABLET | Freq: Once | ORAL | Status: DC

## 2022-04-29 MED ORDER — BORTEZOMIB CHEMO SQ INJECTION 3.5 MG (2.5MG/ML)
1.3000 mg/m2 | Freq: Once | INTRAMUSCULAR | Status: AC
  Administered 2022-04-29: 2.75 mg via SUBCUTANEOUS
  Filled 2022-04-29: qty 1.1

## 2022-04-29 MED ORDER — DEXAMETHASONE 4 MG PO TABS: 20.0000 mg | ORAL_TABLET | Freq: Once | ORAL | Status: DC

## 2022-04-29 NOTE — Patient Instructions (Signed)

## 2022-04-29 NOTE — Patient Instructions (Signed)
[facility]  Discharge Instructions: Thank you for choosing [facility] to provide your oncology and hematology care.   If you have a lab appointment with the [facility], please go directly to the [facility] and check in at the registration area.  Wear comfortable clothing and clothing appropriate for easy access to any Portacath or PICC line.   We strive to give you quality time with your provider. You may need to reschedule your appointment if you arrive late (15 or more minutes).  Arriving late affects you and other patients whose appointments are after yours.  Also, if you miss three or more appointments without notifying the office, you may be dismissed from the clinic at the provider's discretion.      For prescription refill requests, have your pharmacy contact our office and allow 72 hours for refills to be completed.    Today you received the following chemotherapy and/or immunotherapy agents Darzalex, Velcade      To help prevent nausea and vomiting after your treatment, we encourage you to take your nausea medication as directed.  BELOW ARE SYMPTOMS THAT SHOULD BE REPORTED IMMEDIATELY: *FEVER GREATER THAN 100.4 F (38 C) OR HIGHER *CHILLS OR SWEATING *NAUSEA AND VOMITING THAT IS NOT CONTROLLED WITH YOUR NAUSEA MEDICATION *UNUSUAL SHORTNESS OF BREATH *UNUSUAL BRUISING OR BLEEDING *URINARY PROBLEMS (pain or burning when urinating, or frequent urination) *BOWEL PROBLEMS (unusual diarrhea, constipation, pain near the anus) TENDERNESS IN MOUTH AND THROAT WITH OR WITHOUT PRESENCE OF ULCERS (sore throat, sores in mouth, or a toothache) UNUSUAL RASH, SWELLING OR PAIN  UNUSUAL VAGINAL DISCHARGE OR ITCHING   Items with * indicate a potential emergency and should be followed up as soon as possible or go to the Emergency Department if any problems should occur.  Please show the CHEMOTHERAPY ALERT CARD or IMMUNOTHERAPY ALERT CARD at check-in  to the Emergency Department and triage nurse. Should you have questions after your visit or need to cancel or reschedule your appointment, please contact [facility]  [phone] and follow the prompts.  Office hours are 8:00 a.m. to 4:30 p.m. Monday - Friday. Please note that voicemails left after 4:00 p.m. may not be returned until the following business day.  We are closed weekends and major holidays. You have access to a nurse at all times for urgent questions. Please call the main number to the clinic [phone] and follow the prompts.  For any non-urgent questions, you may also contact your provider using MyChart. We now offer e-Visits for anyone 73 and older to request care online for non-urgent symptoms. For details visit [email or web address].   Also download the MyChart app! Go to the app store, search "MyChart", open the app, select [facility], and log in with your MyChart username and password.  Due to Covid, a mask is required upon entering the hospital/clinic. If you do not have a mask, one will be given to you upon arrival. For doctor visits, patients may have 1 support person aged 10 or older with them. For treatment visits, patients cannot have anyone with them due to current Covid guidelines and our immunocompromised population.

## 2022-05-06 ENCOUNTER — Inpatient Hospital Stay: Payer: Medicare Other

## 2022-05-06 ENCOUNTER — Other Ambulatory Visit: Payer: Self-pay | Admitting: Lab

## 2022-05-06 VITALS — BP 118/62 | HR 78 | Temp 98.2°F | Resp 18

## 2022-05-06 DIAGNOSIS — C9001 Multiple myeloma in remission: Secondary | ICD-10-CM

## 2022-05-06 DIAGNOSIS — Z5111 Encounter for antineoplastic chemotherapy: Secondary | ICD-10-CM | POA: Diagnosis not present

## 2022-05-06 DIAGNOSIS — C50911 Malignant neoplasm of unspecified site of right female breast: Secondary | ICD-10-CM

## 2022-05-06 DIAGNOSIS — Z95828 Presence of other vascular implants and grafts: Secondary | ICD-10-CM

## 2022-05-06 LAB — CBC WITH DIFFERENTIAL (CANCER CENTER ONLY)
Abs Immature Granulocytes: 0.03 10*3/uL (ref 0.00–0.07)
Basophils Absolute: 0 10*3/uL (ref 0.0–0.1)
Basophils Relative: 1 %
Eosinophils Absolute: 0 10*3/uL (ref 0.0–0.5)
Eosinophils Relative: 1 %
HCT: 39.7 % (ref 36.0–46.0)
Hemoglobin: 13 g/dL (ref 12.0–15.0)
Immature Granulocytes: 1 %
Lymphocytes Relative: 35 %
Lymphs Abs: 2.2 10*3/uL (ref 0.7–4.0)
MCH: 32.7 pg (ref 26.0–34.0)
MCHC: 32.7 g/dL (ref 30.0–36.0)
MCV: 100 fL (ref 80.0–100.0)
Monocytes Absolute: 0.5 10*3/uL (ref 0.1–1.0)
Monocytes Relative: 8 %
Neutro Abs: 3.6 10*3/uL (ref 1.7–7.7)
Neutrophils Relative %: 54 %
Platelet Count: 131 10*3/uL — ABNORMAL LOW (ref 150–400)
RBC: 3.97 MIL/uL (ref 3.87–5.11)
RDW: 14.2 % (ref 11.5–15.5)
WBC Count: 6.4 10*3/uL (ref 4.0–10.5)
nRBC: 0 % (ref 0.0–0.2)

## 2022-05-06 LAB — CMP (CANCER CENTER ONLY)
ALT: 20 U/L (ref 0–44)
AST: 15 U/L (ref 15–41)
Albumin: 3.9 g/dL (ref 3.5–5.0)
Alkaline Phosphatase: 53 U/L (ref 38–126)
Anion gap: 8 (ref 5–15)
BUN: 27 mg/dL — ABNORMAL HIGH (ref 8–23)
CO2: 28 mmol/L (ref 22–32)
Calcium: 9.3 mg/dL (ref 8.9–10.3)
Chloride: 104 mmol/L (ref 98–111)
Creatinine: 1.66 mg/dL — ABNORMAL HIGH (ref 0.44–1.00)
GFR, Estimated: 32 mL/min — ABNORMAL LOW (ref >60)
Glucose, Bld: 102 mg/dL — ABNORMAL HIGH (ref 70–99)
Potassium: 4 mmol/L (ref 3.5–5.1)
Sodium: 140 mmol/L (ref 135–145)
Total Bilirubin: 0.6 mg/dL (ref 0.3–1.2)
Total Protein: 5.7 g/dL — ABNORMAL LOW (ref 6.5–8.1)

## 2022-05-06 LAB — LACTATE DEHYDROGENASE: LDH: 139 U/L (ref 98–192)

## 2022-05-06 MED ORDER — DEXAMETHASONE 4 MG PO TABS
20.0000 mg | ORAL_TABLET | Freq: Once | ORAL | Status: DC
  Filled 2022-05-06: qty 5

## 2022-05-06 MED ORDER — ACETAMINOPHEN 325 MG PO TABS
650.0000 mg | ORAL_TABLET | Freq: Once | ORAL | Status: DC
  Filled 2022-05-06: qty 2

## 2022-05-06 MED ORDER — BORTEZOMIB CHEMO SQ INJECTION 3.5 MG (2.5MG/ML)
1.3000 mg/m2 | Freq: Once | INTRAMUSCULAR | Status: AC
  Administered 2022-05-06: 2.75 mg via SUBCUTANEOUS
  Filled 2022-05-06: qty 1.1

## 2022-05-06 MED ORDER — SODIUM CHLORIDE 0.9% FLUSH
10.0000 mL | INTRAVENOUS | Status: DC | PRN
  Administered 2022-05-06: 10 mL via INTRAVENOUS

## 2022-05-06 MED ORDER — DIPHENHYDRAMINE HCL 25 MG PO CAPS: 50.0000 mg | ORAL_CAPSULE | Freq: Once | ORAL | Status: DC

## 2022-05-06 MED ORDER — SODIUM CHLORIDE 0.9% FLUSH
10.0000 mL | INTRAVENOUS | Status: DC | PRN
  Administered 2022-05-06: 10 mL

## 2022-05-06 MED ORDER — DARATUMUMAB-HYALURONIDASE-FIHJ 1800-30000 MG-UT/15ML ~~LOC~~ SOLN
1800.0000 mg | Freq: Once | SUBCUTANEOUS | Status: AC
  Administered 2022-05-06: 1800 mg via SUBCUTANEOUS
  Filled 2022-05-06: qty 15

## 2022-05-06 NOTE — Addendum Note (Signed)
Addended by: Burney Gauze R on: 05/06/2022 11:12 AM   Modules accepted: Orders

## 2022-05-06 NOTE — Progress Notes (Signed)
Ok to treat with Creatinine of 1.66 today per dr [name]

## 2022-05-07 LAB — KAPPA/LAMBDA LIGHT CHAINS
Kappa free light chain: 43.9 mg/L — ABNORMAL HIGH (ref 3.3–19.4)
Kappa, lambda light chain ratio: 5.05 — ABNORMAL HIGH (ref 0.26–1.65)
Lambda free light chains: 8.7 mg/L (ref 5.7–26.3)

## 2022-05-07 LAB — IGG, IGA, IGM
IgA: 21 mg/dL — ABNORMAL LOW (ref 64–422)
IgG (Immunoglobin G), Serum: 428 mg/dL — ABNORMAL LOW (ref 586–1602)
IgM (Immunoglobulin M), Srm: 35 mg/dL (ref 26–217)

## 2022-05-08 LAB — PROTEIN ELECTROPHORESIS, SERUM, WITH REFLEX
A/G Ratio: 1.5 (ref 0.7–1.7)
Albumin ELP: 3.2 g/dL (ref 2.9–4.4)
Alpha-1-Globulin: 0.1 g/dL (ref 0.0–0.4)
Alpha-2-Globulin: 0.7 g/dL (ref 0.4–1.0)
Beta Globulin: 1 g/dL (ref 0.7–1.3)
Gamma Globulin: 0.3 g/dL — ABNORMAL LOW (ref 0.4–1.8)
Globulin, Total: 2.2 g/dL (ref 2.2–3.9)
Total Protein ELP: 5.4 g/dL — ABNORMAL LOW (ref 6.0–8.5)

## 2022-05-12 ENCOUNTER — Other Ambulatory Visit: Payer: Self-pay | Admitting: *Deleted

## 2022-05-12 DIAGNOSIS — C9001 Multiple myeloma in remission: Secondary | ICD-10-CM

## 2022-05-13 ENCOUNTER — Inpatient Hospital Stay: Payer: Medicare Other

## 2022-05-13 ENCOUNTER — Inpatient Hospital Stay (HOSPITAL_BASED_OUTPATIENT_CLINIC_OR_DEPARTMENT_OTHER): Payer: Medicare Other | Admitting: Hematology & Oncology

## 2022-05-13 ENCOUNTER — Other Ambulatory Visit: Payer: Self-pay | Admitting: Oncology

## 2022-05-13 ENCOUNTER — Other Ambulatory Visit: Payer: Self-pay

## 2022-05-13 ENCOUNTER — Encounter: Payer: Self-pay | Admitting: Hematology & Oncology

## 2022-05-13 VITALS — BP 130/76 | HR 96 | Temp 98.5°F | Resp 18 | Wt 230.0 lb

## 2022-05-13 DIAGNOSIS — C9001 Multiple myeloma in remission: Secondary | ICD-10-CM

## 2022-05-13 DIAGNOSIS — Z5111 Encounter for antineoplastic chemotherapy: Secondary | ICD-10-CM | POA: Diagnosis not present

## 2022-05-13 DIAGNOSIS — C50911 Malignant neoplasm of unspecified site of right female breast: Secondary | ICD-10-CM

## 2022-05-13 LAB — CBC WITH DIFFERENTIAL (CANCER CENTER ONLY)
Abs Immature Granulocytes: 0.02 10*3/uL (ref 0.00–0.07)
Basophils Absolute: 0 10*3/uL (ref 0.0–0.1)
Basophils Relative: 0 %
Eosinophils Absolute: 0 10*3/uL (ref 0.0–0.5)
Eosinophils Relative: 1 %
HCT: 38.1 % (ref 36.0–46.0)
Hemoglobin: 12.3 g/dL (ref 12.0–15.0)
Immature Granulocytes: 0 %
Lymphocytes Relative: 35 %
Lymphs Abs: 2.3 10*3/uL (ref 0.7–4.0)
MCH: 32.5 pg (ref 26.0–34.0)
MCHC: 32.3 g/dL (ref 30.0–36.0)
MCV: 100.5 fL — ABNORMAL HIGH (ref 80.0–100.0)
Monocytes Absolute: 0.5 10*3/uL (ref 0.1–1.0)
Monocytes Relative: 7 %
Neutro Abs: 3.8 10*3/uL (ref 1.7–7.7)
Neutrophils Relative %: 57 %
Platelet Count: 131 10*3/uL — ABNORMAL LOW (ref 150–400)
RBC: 3.79 MIL/uL — ABNORMAL LOW (ref 3.87–5.11)
RDW: 14.3 % (ref 11.5–15.5)
WBC Count: 6.7 10*3/uL (ref 4.0–10.5)
nRBC: 0 % (ref 0.0–0.2)

## 2022-05-13 LAB — COMPREHENSIVE METABOLIC PANEL
ALT: 22 U/L (ref 0–44)
AST: 17 U/L (ref 15–41)
Albumin: 3.8 g/dL (ref 3.5–5.0)
Alkaline Phosphatase: 76 U/L (ref 38–126)
Anion gap: 6 (ref 5–15)
BUN: 22 mg/dL (ref 8–23)
CO2: 27 mmol/L (ref 22–32)
Calcium: 8.8 mg/dL — ABNORMAL LOW (ref 8.9–10.3)
Chloride: 105 mmol/L (ref 98–111)
Creatinine, Ser: 1.47 mg/dL — ABNORMAL HIGH (ref 0.44–1.00)
GFR, Estimated: 37 mL/min — ABNORMAL LOW (ref >60)
Glucose, Bld: 99 mg/dL (ref 70–99)
Potassium: 3.9 mmol/L (ref 3.5–5.1)
Sodium: 138 mmol/L (ref 135–145)
Total Bilirubin: 0.5 mg/dL (ref 0.3–1.2)
Total Protein: 5.9 g/dL — ABNORMAL LOW (ref 6.5–8.1)

## 2022-05-13 LAB — LACTATE DEHYDROGENASE: LDH: 145 U/L (ref 98–192)

## 2022-05-13 MED ORDER — DARATUMUMAB-HYALURONIDASE-FIHJ 1800-30000 MG-UT/15ML ~~LOC~~ SOLN
1800.0000 mg | Freq: Once | SUBCUTANEOUS | Status: AC
  Administered 2022-05-13: 1800 mg via SUBCUTANEOUS
  Filled 2022-05-13: qty 15

## 2022-05-13 MED ORDER — DIPHENHYDRAMINE HCL 25 MG PO CAPS: 50.0000 mg | ORAL_CAPSULE | Freq: Once | ORAL | Status: DC

## 2022-05-13 MED ORDER — ACETAMINOPHEN 325 MG PO TABS: 650.0000 mg | ORAL_TABLET | Freq: Once | ORAL | Status: DC

## 2022-05-13 MED ORDER — DEXAMETHASONE 4 MG PO TABS: 20.0000 mg | ORAL_TABLET | Freq: Once | ORAL | Status: DC

## 2022-05-13 NOTE — Patient Instructions (Signed)

## 2022-05-13 NOTE — Patient Instructions (Signed)
[facility] [place]  Discharge Instructions: Thank you for choosing [facility] to provide your oncology and hematology care.   If you have a lab appointment with the Cancer Center, please go directly to the Cancer Center and check in at the registration area.  Wear comfortable clothing and clothing appropriate for easy access to any Portacath or PICC line.   We strive to give you quality time with your provider. You may need to reschedule your appointment if you arrive late (15 or more minutes).  Arriving late affects you and other patients whose appointments are after yours.  Also, if you miss three or more appointments without notifying the office, you may be dismissed from the clinic at the provider's discretion.      For prescription refill requests, have your pharmacy contact our office and allow 72 hours for refills to be completed.    Today you received the following chemotherapy and/or immunotherapy agents Darzalex.      To help prevent nausea and vomiting after your treatment, we encourage you to take your nausea medication as directed.  BELOW ARE SYMPTOMS THAT SHOULD BE REPORTED IMMEDIATELY: *FEVER GREATER THAN 100.4 F (38 C) OR HIGHER *CHILLS OR SWEATING *NAUSEA AND VOMITING THAT IS NOT CONTROLLED WITH YOUR NAUSEA MEDICATION *UNUSUAL SHORTNESS OF BREATH *UNUSUAL BRUISING OR BLEEDING *URINARY PROBLEMS (pain or burning when urinating, or frequent urination) *BOWEL PROBLEMS (unusual diarrhea, constipation, pain near the anus) TENDERNESS IN MOUTH AND THROAT WITH OR WITHOUT PRESENCE OF ULCERS (sore throat, sores in mouth, or a toothache) UNUSUAL RASH, SWELLING OR PAIN  UNUSUAL VAGINAL DISCHARGE OR ITCHING   Items with * indicate a potential emergency and should be followed up as soon as possible or go to the Emergency Department if any problems should occur.  Please show the CHEMOTHERAPY ALERT CARD or IMMUNOTHERAPY ALERT CARD at check-in to the  Emergency Department and triage nurse. Should you have questions after your visit or need to cancel or reschedule your appointment, please contact [facility]  [phone] and follow the prompts.  Office hours are 8:00 a.m. to 4:30 p.m. Monday - Friday. Please note that voicemails left after 4:00 p.m. may not be returned until the following business day.  We are closed weekends and major holidays. You have access to a nurse at all times for urgent questions. Please call the main number to the clinic [phone] and follow the prompts.  For any non-urgent questions, you may also contact your provider using MyChart. We now offer e-Visits for anyone 18 and older to request care online for non-urgent symptoms. For details visit [email or web address].   Also download the MyChart app! Go to the app store, search "MyChart", open the app, select , and log in with your MyChart username and password.  Masks are optional in the cancer centers. If you would like for your care team to wear a mask while they are taking care of you, please let them know. For doctor visits, patients may have with them one support person who is at least 73 years old. At this time, visitors are not allowed in the infusion area. 

## 2022-05-13 NOTE — Progress Notes (Signed)
Hematology and Oncology Follow Up Visit  Marcia [name] [id number] [birth year] 71 y.o. 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  She is doing pretty well.  She really has no specific complaints.  I am just happy that she seems to be responding to the Faspro and Velcade.  When we last checked her light chain level, the Kappa light chain was down to 4.4 mg/dL.  She has had no issues with cough or shortness of breath.  She is on Eliquis.  This is low-dose Eliquis as a maintenance.  She has had no change in bowel or bladder habits.  She has had no rashes.  She has had no pain issues although she is bothered by her knee.  Sounds like she may need to have surgery for this.  If she does, I do not see an issue with her having any problems with healing or infections.  She has had no bleeding.  Overall, I would say performance status is probably ECOG 1.     Medications:  Allergies as of 05/13/2022       Reactions   Bactrim [sulfamethoxazole-trimethoprim] Other (See Comments)   ? Renal failure.   Heparin Other (See Comments)   HIT   Zofran [ondansetron] Shortness Of Breath   "felt like throat was closing"   Clarithromycin Diarrhea, Nausea And Vomiting   Latex Rash   Macrodantin [nitrofurantoin] Rash        Medication List         Accurate as of May 13, 2022 11:32 AM. If you have any questions, ask your nurse or doctor.          STOP taking these medications    LORazepam 0.5 MG tablet Commonly known as: ATIVAN Stopped by: [name], MD       TAKE these medications    acetaminophen 325 MG tablet Commonly known as: TYLENOL Take 650 mg by mouth daily as needed for moderate pain or headache.   amoxicillin 500 MG capsule Commonly known as: AMOXIL Prior to dentist   dexamethasone 6 MG tablet Commonly known as: DECADRON Take 2 tablets (12 mg total) by mouth daily. Take 30 minutes prior to treatment.   Eliquis 2.5 MG Tabs tablet Generic drug: apixaban TAKE 1 TABLET BY MOUTH TWICE A DAY   famciclovir 250 MG tablet Commonly known as: FAMVIR TAKE 1 TABLET BY MOUTH EVERY DAY   feeding supplement Liqd Take 237 mLs by mouth 3 (three) times daily between meals. What changed: when to take this   folic acid 1 MG tablet Commonly known as: FOLVITE TAKE 2 TABLETS (2 MG TOTAL) BY MOUTH DAILY.   lidocaine-prilocaine cream Commonly known as: EMLA Apply 1 application topically as needed (local anesthetic).   losartan 25 MG tablet Commonly known as: COZAAR Take 25 mg by mouth daily.   montelukast 10 MG tablet Commonly  known as: Singulair Take 1 tablet (10 mg total) by mouth at bedtime. Start the Singulair on 03/14/2022        Allergies:  Allergies  Allergen Reactions   Bactrim [Sulfamethoxazole-Trimethoprim] Other (See Comments)    ? Renal failure.   Heparin Other (See Comments)    HIT   Zofran [Ondansetron] Shortness Of Breath    "felt like throat was closing"   Clarithromycin Diarrhea and Nausea And Vomiting   Latex Rash   Macrodantin [Nitrofurantoin] Rash    Past Medical History, Surgical history, Social history, and Family History were reviewed and updated.  Review of Systems: Review of Systems  Constitutional: Negative.   HENT: Negative.    Eyes: Negative.    Respiratory: Negative.    Cardiovascular: Negative.   Gastrointestinal: Negative.   Genitourinary: Negative.   Musculoskeletal: Negative.   Skin: Negative.   Neurological: Negative.   Endo/Heme/Allergies: Negative.   Psychiatric/Behavioral: Negative.       Physical Exam:  weight is 230 lb (104.3 kg). Her oral temperature is 98.5 F (36.9 C). Her blood pressure is 130/76 and her pulse is 96. Her respiration is 18 and oxygen saturation is 96%.   Wt Readings from Last 3 Encounters:  05/13/22 230 lb (104.3 kg)  04/08/22 229 lb (103.9 kg)  03/19/22 227 lb (103 kg)    Physical Exam Vitals reviewed.  HENT:     Head: Normocephalic and atraumatic.  Eyes:     Pupils: Pupils are equal, round, and reactive to light.  Cardiovascular:     Rate and Rhythm: Normal rate and regular rhythm.     Heart sounds: Normal heart sounds.  Pulmonary:     Effort: Pulmonary effort is normal.     Breath sounds: Normal breath sounds.  Abdominal:     General: Bowel sounds are normal.     Palpations: Abdomen is soft.  Musculoskeletal:        General: No tenderness or deformity. Normal range of motion.     Cervical back: Normal range of motion.  Lymphadenopathy:     Cervical: No cervical adenopathy.  Skin:    General: Skin is warm and dry.     Findings: No erythema or rash.  Neurological:     Mental Status: She is alert and oriented to person, place, and time.  Psychiatric:        Behavior: Behavior normal.        Thought Content: Thought content normal.        Judgment: Judgment normal.     Lab Results  Component Value Date   WBC 6.7 05/13/2022   HGB 12.3 05/13/2022   HCT 38.1 05/13/2022   MCV 100.5 (H) 05/13/2022   PLT 131 (L) 05/13/2022   Lab Results  Component Value Date   FERRITIN 820 (H) 05/17/2018   IRON 272 (H) 05/17/2018   TIBC 295 05/17/2018   UIBC 23 05/17/2018   IRONPCTSAT 92 (H) 05/17/2018   Lab Results  Component Value Date   RETICCTPCT 1.3 03/27/2011   RBC 3.79  (L) 05/13/2022   RETICCTABS 56.4 03/27/2011   Lab Results  Component Value Date   KPAFRELGTCHN 43.9 (H) 05/06/2022   LAMBDASER 8.7 05/06/2022   KAPLAMBRATIO 5.05 (H) 05/06/2022   Lab Results  Component Value Date   IGGSERUM 428 (L) 05/06/2022   IGA 21 (L) 05/06/2022   IGMSERUM 35 05/06/2022   Lab Results  Component Value Date   TOTALPROTELP 5.4 (L) 05/06/2022   ALBUMINELP 3.2 05/06/2022  A1GS 0.1 05/06/2022   A2GS 0.7 05/06/2022   BETS 1.0 05/06/2022   BETA2SER 0.4 07/02/2015   GAMS 0.3 (L) 05/06/2022   MSPIKE Not Observed 05/06/2022   SPEI Comment 04/08/2022     Chemistry      Component Value Date/Time   NA 138 05/13/2022 0949   NA 141 07/06/2017 0816   NA 141 01/05/2017 0956   K 3.9 05/13/2022 0949   K 3.6 07/06/2017 0816   K 4.3 01/05/2017 0956   CL 105 05/13/2022 0949   CL 101 07/06/2017 0816   CO2 27 05/13/2022 0949   CO2 28 07/06/2017 0816   CO2 23 01/05/2017 0956   BUN 22 05/13/2022 0949   BUN 28 (H) 07/06/2017 0816   BUN 22.4 01/05/2017 0956   CREATININE 1.47 (H) 05/13/2022 0949   CREATININE 1.66 (H) 05/06/2022 1013   CREATININE 1.6 (H) 07/06/2017 0816   CREATININE 1.4 (H) 01/05/2017 0956      Component Value Date/Time   CALCIUM 8.8 (L) 05/13/2022 0949   CALCIUM 9.1 07/06/2017 0816   CALCIUM 9.3 01/05/2017 0956   ALKPHOS 76 05/13/2022 0949   ALKPHOS 58 07/06/2017 0816   ALKPHOS 52 01/05/2017 0956   AST 17 05/13/2022 0949   AST 15 05/06/2022 1013   AST 14 01/05/2017 0956   ALT 22 05/13/2022 0949   ALT 20 05/06/2022 1013   ALT 58 (H) 07/06/2017 0816   ALT 13 01/05/2017 0956   BILITOT 0.5 05/13/2022 0949   BILITOT 0.6 05/06/2022 1013   BILITOT 0.56 01/05/2017 0956       Impression and Plan: Ms. Ellsworth is a very pleasant 71 yo caucasian female with kappa light chain myeloma that has recurred again.  She seems to be doing well with the Faspro and Velcade.  I am happy about this.  We will see what her light chain studies look like.  As far  as her breast cancer, this really is not an issue right now.  Again we still have to monitor this..  She does have a history of pulmonary embolism.  I do have her on low-dose Eliquis as a long-term maintenance.  Hopefully, we will be able to get her on every other week Faspro.  If everything continues to respond, then maybe we can adjust her Velcade dosing.  I am happy that her quality of life is doing well.  Again she is bothered mostly by her knee.  We will plan to get her back to see [place] in another month or so.      [name], MD 6/20/202311:32 AM

## 2022-05-14 ENCOUNTER — Telehealth: Payer: Self-pay | Admitting: Hematology & Oncology

## 2022-05-14 LAB — IGG, IGA, IGM
IgA: 20 mg/dL — ABNORMAL LOW (ref 64–422)
IgG (Immunoglobin G), Serum: 415 mg/dL — ABNORMAL LOW (ref 586–1602)
IgM (Immunoglobulin M), Srm: 32 mg/dL (ref 26–217)

## 2022-05-14 LAB — KAPPA/LAMBDA LIGHT CHAINS
Kappa free light chain: 39.3 mg/L — ABNORMAL HIGH (ref 3.3–19.4)
Kappa, lambda light chain ratio: 4.62 — ABNORMAL HIGH (ref 0.26–1.65)
Lambda free light chains: 8.5 mg/L (ref 5.7–26.3)

## 2022-05-14 NOTE — Telephone Encounter (Signed)
Called to schedule per 6/20 los, left voicemail for patient to call us back to schedule

## 2022-05-16 LAB — IMMUNOFIXATION REFLEX, SERUM
IgA: 21 mg/dL — ABNORMAL LOW (ref 64–422)
IgG (Immunoglobin G), Serum: 426 mg/dL — ABNORMAL LOW (ref 586–1602)
IgM (Immunoglobulin M), Srm: 30 mg/dL (ref 26–217)

## 2022-05-16 LAB — PROTEIN ELECTROPHORESIS, SERUM, WITH REFLEX
A/G Ratio: 1.3 (ref 0.7–1.7)
Albumin ELP: 3.1 g/dL (ref 2.9–4.4)
Alpha-1-Globulin: 0.2 g/dL (ref 0.0–0.4)
Alpha-2-Globulin: 0.7 g/dL (ref 0.4–1.0)
Beta Globulin: 1 g/dL (ref 0.7–1.3)
Gamma Globulin: 0.4 g/dL (ref 0.4–1.8)
Globulin, Total: 2.3 g/dL (ref 2.2–3.9)
M-Spike, %: 0.1 g/dL — ABNORMAL HIGH
SPEP Interpretation: 0
Total Protein ELP: 5.4 g/dL — ABNORMAL LOW (ref 6.0–8.5)

## 2022-05-22 NOTE — Progress Notes (Signed)
Cycle 3 day 15 deleted per Dr. Antonieta Pert instructions. Patient will next receive cycle 4 day 1 on 05/30/22.

## 2022-05-23 ENCOUNTER — Inpatient Hospital Stay: Payer: Medicare Other

## 2022-05-29 ENCOUNTER — Other Ambulatory Visit: Payer: Self-pay | Admitting: *Deleted

## 2022-05-29 DIAGNOSIS — C9001 Multiple myeloma in remission: Secondary | ICD-10-CM

## 2022-05-30 ENCOUNTER — Inpatient Hospital Stay: Payer: Medicare Other

## 2022-05-30 ENCOUNTER — Inpatient Hospital Stay: Payer: Medicare Other | Attending: Hematology & Oncology

## 2022-05-30 VITALS — BP 114/60 | HR 98 | Temp 97.8°F | Resp 18

## 2022-05-30 VITALS — BP 129/73 | HR 87 | Temp 98.6°F | Resp 18

## 2022-05-30 DIAGNOSIS — C9000 Multiple myeloma not having achieved remission: Secondary | ICD-10-CM | POA: Diagnosis not present

## 2022-05-30 DIAGNOSIS — C9001 Multiple myeloma in remission: Secondary | ICD-10-CM

## 2022-05-30 DIAGNOSIS — Z853 Personal history of malignant neoplasm of breast: Secondary | ICD-10-CM | POA: Insufficient documentation

## 2022-05-30 DIAGNOSIS — C50911 Malignant neoplasm of unspecified site of right female breast: Secondary | ICD-10-CM

## 2022-05-30 DIAGNOSIS — Z5111 Encounter for antineoplastic chemotherapy: Secondary | ICD-10-CM | POA: Insufficient documentation

## 2022-05-30 DIAGNOSIS — Z5112 Encounter for antineoplastic immunotherapy: Secondary | ICD-10-CM | POA: Insufficient documentation

## 2022-05-30 DIAGNOSIS — Z95828 Presence of other vascular implants and grafts: Secondary | ICD-10-CM

## 2022-05-30 LAB — CMP (CANCER CENTER ONLY)
ALT: 14 U/L (ref 0–44)
AST: 13 U/L — ABNORMAL LOW (ref 15–41)
Albumin: 4.2 g/dL (ref 3.5–5.0)
Alkaline Phosphatase: 62 U/L (ref 38–126)
Anion gap: 8 (ref 5–15)
BUN: 28 mg/dL — ABNORMAL HIGH (ref 8–23)
CO2: 28 mmol/L (ref 22–32)
Calcium: 9.3 mg/dL (ref 8.9–10.3)
Chloride: 105 mmol/L (ref 98–111)
Creatinine: 1.48 mg/dL — ABNORMAL HIGH (ref 0.44–1.00)
GFR, Estimated: 37 mL/min — ABNORMAL LOW (ref >60)
Glucose, Bld: 106 mg/dL — ABNORMAL HIGH (ref 70–99)
Potassium: 3.9 mmol/L (ref 3.5–5.1)
Sodium: 141 mmol/L (ref 135–145)
Total Bilirubin: 0.5 mg/dL (ref 0.3–1.2)
Total Protein: 6.1 g/dL — ABNORMAL LOW (ref 6.5–8.1)

## 2022-05-30 LAB — CBC WITH DIFFERENTIAL (CANCER CENTER ONLY)
Abs Immature Granulocytes: 0.01 10*3/uL (ref 0.00–0.07)
Basophils Absolute: 0 10*3/uL (ref 0.0–0.1)
Basophils Relative: 0 %
Eosinophils Absolute: 0.1 10*3/uL (ref 0.0–0.5)
Eosinophils Relative: 1 %
HCT: 40.6 % (ref 36.0–46.0)
Hemoglobin: 13.4 g/dL (ref 12.0–15.0)
Immature Granulocytes: 0 %
Lymphocytes Relative: 52 %
Lymphs Abs: 4.2 10*3/uL — ABNORMAL HIGH (ref 0.7–4.0)
MCH: 32.8 pg (ref 26.0–34.0)
MCHC: 33 g/dL (ref 30.0–36.0)
MCV: 99.3 fL (ref 80.0–100.0)
Monocytes Absolute: 0.5 10*3/uL (ref 0.1–1.0)
Monocytes Relative: 7 %
Neutro Abs: 3.2 10*3/uL (ref 1.7–7.7)
Neutrophils Relative %: 40 %
Platelet Count: 155 10*3/uL (ref 150–400)
RBC: 4.09 MIL/uL (ref 3.87–5.11)
RDW: 13.9 % (ref 11.5–15.5)
WBC Count: 8 10*3/uL (ref 4.0–10.5)
nRBC: 0 % (ref 0.0–0.2)

## 2022-05-30 MED ORDER — DIPHENHYDRAMINE HCL 25 MG PO CAPS
50.0000 mg | ORAL_CAPSULE | Freq: Once | ORAL | Status: DC
  Filled 2022-05-30: qty 2

## 2022-05-30 MED ORDER — DARATUMUMAB-HYALURONIDASE-FIHJ 1800-30000 MG-UT/15ML ~~LOC~~ SOLN
1800.0000 mg | Freq: Once | SUBCUTANEOUS | Status: AC
  Administered 2022-05-30: 1800 mg via SUBCUTANEOUS
  Filled 2022-05-30: qty 15

## 2022-05-30 MED ORDER — SODIUM CHLORIDE 0.9% FLUSH
10.0000 mL | Freq: Once | INTRAVENOUS | Status: AC
  Administered 2022-05-30: 10 mL via INTRAVENOUS

## 2022-05-30 MED ORDER — ACETAMINOPHEN 325 MG PO TABS
650.0000 mg | ORAL_TABLET | Freq: Once | ORAL | Status: DC
  Filled 2022-05-30: qty 2

## 2022-05-30 MED ORDER — DEXAMETHASONE 4 MG PO TABS
20.0000 mg | ORAL_TABLET | Freq: Once | ORAL | Status: DC
  Filled 2022-05-30: qty 5

## 2022-05-30 MED ORDER — BORTEZOMIB CHEMO SQ INJECTION 3.5 MG (2.5MG/ML)
1.3000 mg/m2 | Freq: Once | INTRAMUSCULAR | Status: AC
  Administered 2022-05-30: 2.75 mg via SUBCUTANEOUS
  Filled 2022-05-30: qty 1.1

## 2022-05-30 NOTE — Patient Instructions (Signed)
[facility]  Discharge Instructions: Thank you for choosing [facility] to provide your oncology and hematology care.   If you have a lab appointment with the [facility], please go directly to the [facility] and check in at the registration area.  Wear comfortable clothing and clothing appropriate for easy access to any Portacath or PICC line.   We strive to give you quality time with your provider. You may need to reschedule your appointment if you arrive late (15 or more minutes).  Arriving late affects you and other patients whose appointments are after yours.  Also, if you miss three or more appointments without notifying the office, you may be dismissed from the clinic at the provider's discretion.      For prescription refill requests, have your pharmacy contact our office and allow 72 hours for refills to be completed.    Today you received the following chemotherapy and/or immunotherapy agents fastpro, velcade      To help prevent nausea and vomiting after your treatment, we encourage you to take your nausea medication as directed.  BELOW ARE SYMPTOMS THAT SHOULD BE REPORTED IMMEDIATELY: *FEVER GREATER THAN 100.4 F (38 C) OR HIGHER *CHILLS OR SWEATING *NAUSEA AND VOMITING THAT IS NOT CONTROLLED WITH YOUR NAUSEA MEDICATION *UNUSUAL SHORTNESS OF BREATH *UNUSUAL BRUISING OR BLEEDING *URINARY PROBLEMS (pain or burning when urinating, or frequent urination) *BOWEL PROBLEMS (unusual diarrhea, constipation, pain near the anus) TENDERNESS IN MOUTH AND THROAT WITH OR WITHOUT PRESENCE OF ULCERS (sore throat, sores in mouth, or a toothache) UNUSUAL RASH, SWELLING OR PAIN  UNUSUAL VAGINAL DISCHARGE OR ITCHING   Items with * indicate a potential emergency and should be followed up as soon as possible or go to the Emergency Department if any problems should occur.  Please show the CHEMOTHERAPY ALERT CARD or IMMUNOTHERAPY ALERT CARD at check-in  to the Emergency Department and triage nurse. Should you have questions after your visit or need to cancel or reschedule your appointment, please contact [facility]  [phone] and follow the prompts.  Office hours are 8:00 a.m. to 4:30 p.m. Monday - Friday. Please note that voicemails left after 4:00 p.m. may not be returned until the following business day.  We are closed weekends and major holidays. You have access to a nurse at all times for urgent questions. Please call the main number to the clinic [phone] and follow the prompts.  For any non-urgent questions, you may also contact your provider using MyChart. We now offer e-Visits for anyone 49 and older to request care online for non-urgent symptoms. For details visit [email or web address].   Also download the MyChart app! Go to the app store, search "MyChart", open the app, select [facility], and log in with your MyChart username and password.  Masks are optional in the cancer centers. If you would like for your care team to wear a mask while they are taking care of you, please let them know. For doctor visits, patients may have with them one support person who is at least 73 years old. At this time, visitors are not allowed in the infusion area.

## 2022-05-30 NOTE — Progress Notes (Signed)
Labs reviewed by MD, ok to treat despite counts. 

## 2022-06-05 ENCOUNTER — Other Ambulatory Visit: Payer: Self-pay | Admitting: Family

## 2022-06-05 DIAGNOSIS — C9000 Multiple myeloma not having achieved remission: Secondary | ICD-10-CM

## 2022-06-06 ENCOUNTER — Inpatient Hospital Stay: Payer: Medicare Other

## 2022-06-06 DIAGNOSIS — C50911 Malignant neoplasm of unspecified site of right female breast: Secondary | ICD-10-CM

## 2022-06-06 DIAGNOSIS — Z5111 Encounter for antineoplastic chemotherapy: Secondary | ICD-10-CM | POA: Diagnosis not present

## 2022-06-06 DIAGNOSIS — C9000 Multiple myeloma not having achieved remission: Secondary | ICD-10-CM

## 2022-06-06 DIAGNOSIS — C9001 Multiple myeloma in remission: Secondary | ICD-10-CM

## 2022-06-06 LAB — CMP (CANCER CENTER ONLY)
ALT: 12 U/L (ref 0–44)
AST: 12 U/L — ABNORMAL LOW (ref 15–41)
Albumin: 4.1 g/dL (ref 3.5–5.0)
Alkaline Phosphatase: 51 U/L (ref 38–126)
Anion gap: 10 (ref 5–15)
BUN: 23 mg/dL (ref 8–23)
CO2: 25 mmol/L (ref 22–32)
Calcium: 9 mg/dL (ref 8.9–10.3)
Chloride: 103 mmol/L (ref 98–111)
Creatinine: 1.32 mg/dL — ABNORMAL HIGH (ref 0.44–1.00)
GFR, Estimated: 43 mL/min — ABNORMAL LOW (ref >60)
Glucose, Bld: 104 mg/dL — ABNORMAL HIGH (ref 70–99)
Potassium: 3.9 mmol/L (ref 3.5–5.1)
Sodium: 138 mmol/L (ref 135–145)
Total Bilirubin: 0.6 mg/dL (ref 0.3–1.2)
Total Protein: 5.7 g/dL — ABNORMAL LOW (ref 6.5–8.1)

## 2022-06-06 LAB — CBC WITH DIFFERENTIAL (CANCER CENTER ONLY)
Abs Immature Granulocytes: 0.03 10*3/uL (ref 0.00–0.07)
Basophils Absolute: 0 10*3/uL (ref 0.0–0.1)
Basophils Relative: 0 %
Eosinophils Absolute: 0.1 10*3/uL (ref 0.0–0.5)
Eosinophils Relative: 1 %
HCT: 39.7 % (ref 36.0–46.0)
Hemoglobin: 13 g/dL (ref 12.0–15.0)
Immature Granulocytes: 0 %
Lymphocytes Relative: 55 %
Lymphs Abs: 4.9 10*3/uL — ABNORMAL HIGH (ref 0.7–4.0)
MCH: 32.7 pg (ref 26.0–34.0)
MCHC: 32.7 g/dL (ref 30.0–36.0)
MCV: 100 fL (ref 80.0–100.0)
Monocytes Absolute: 0.6 10*3/uL (ref 0.1–1.0)
Monocytes Relative: 7 %
Neutro Abs: 3.2 10*3/uL (ref 1.7–7.7)
Neutrophils Relative %: 37 %
Platelet Count: 161 10*3/uL (ref 150–400)
RBC: 3.97 MIL/uL (ref 3.87–5.11)
RDW: 14.1 % (ref 11.5–15.5)
WBC Count: 8.8 10*3/uL (ref 4.0–10.5)
nRBC: 0 % (ref 0.0–0.2)

## 2022-06-06 MED ORDER — BORTEZOMIB CHEMO SQ INJECTION 3.5 MG (2.5MG/ML)
1.3000 mg/m2 | Freq: Once | INTRAMUSCULAR | Status: AC
  Administered 2022-06-06: 2.75 mg via SUBCUTANEOUS
  Filled 2022-06-06: qty 1.1

## 2022-06-06 MED ORDER — DEXAMETHASONE 4 MG PO TABS: 20.0000 mg | ORAL_TABLET | Freq: Once | ORAL | Status: DC

## 2022-06-06 NOTE — Patient Instructions (Signed)
[facility] [place]  Discharge Instructions: Thank you for choosing [facility] to provide your oncology and hematology care.   If you have a lab appointment with the [facility], please go directly to the [facility] and check in at the registration area.  Wear comfortable clothing and clothing appropriate for easy access to any Portacath or PICC line.   We strive to give you quality time with your provider. You may need to reschedule your appointment if you arrive late (15 or more minutes).  Arriving late affects you and other patients whose appointments are after yours.  Also, if you miss three or more appointments without notifying the office, you may be dismissed from the clinic at the provider's discretion.      For prescription refill requests, have your pharmacy contact our office and allow 72 hours for refills to be completed.    Today you received the following chemotherapy and/or immunotherapy agents Velcade      To help prevent nausea and vomiting after your treatment, we encourage you to take your nausea medication as directed.  BELOW ARE SYMPTOMS THAT SHOULD BE REPORTED IMMEDIATELY: *FEVER GREATER THAN 100.4 F (38 C) OR HIGHER *CHILLS OR SWEATING *NAUSEA AND VOMITING THAT IS NOT CONTROLLED WITH YOUR NAUSEA MEDICATION *UNUSUAL SHORTNESS OF BREATH *UNUSUAL BRUISING OR BLEEDING *URINARY PROBLEMS (pain or burning when urinating, or frequent urination) *BOWEL PROBLEMS (unusual diarrhea, constipation, pain near the anus) TENDERNESS IN MOUTH AND THROAT WITH OR WITHOUT PRESENCE OF ULCERS (sore throat, sores in mouth, or a toothache) UNUSUAL RASH, SWELLING OR PAIN  UNUSUAL VAGINAL DISCHARGE OR ITCHING   Items with * indicate a potential emergency and should be followed up as soon as possible or go to the Emergency Department if any problems should occur.  Please show the CHEMOTHERAPY ALERT CARD or IMMUNOTHERAPY ALERT CARD at check-in to the  Emergency Department and triage nurse. Should you have questions after your visit or need to cancel or reschedule your appointment, please contact [facility]  [phone] and follow the prompts.  Office hours are 8:00 a.m. to 4:30 p.m. Monday - Friday. Please note that voicemails left after 4:00 p.m. may not be returned until the following business day.  We are closed weekends and major holidays. You have access to a nurse at all times for urgent questions. Please call the main number to the clinic [phone] and follow the prompts.  For any non-urgent questions, you may also contact your provider using MyChart. We now offer e-Visits for anyone 21 and older to request care online for non-urgent symptoms. For details visit [email or web address].   Also download the MyChart app! Go to the app store, search "MyChart", open the app, select [facility], and log in with your MyChart username and password.  Masks are optional in the cancer centers. If you would like for your care team to wear a mask while they are taking care of you, please let them know. For doctor visits, patients may have with them one support person who is at least 73 years old. At this time, visitors are not allowed in the infusion area.

## 2022-06-06 NOTE — Patient Instructions (Signed)
Implanted Port Removal  Implanted port removal is a procedure to remove the port and catheter that are implanted under your skin. The port is a small disc under your skin that can be punctured with a needle. It is connected to a vein in your chest or neck by a small, thin tube (catheter). The implanted port is used to give medicines for treatments, and it may also be used to take blood samples. Your health care provider will remove the implanted port if: You no longer need it for treatment. It is not working properly. The area around it gets infected. Tell a health care provider about: Any allergies you have. All medicines you are taking, including vitamins, herbs, eye drops, creams, and over-the-counter medicines. Any problems you or family members have had with anesthetic medicines. Any bleeding problems you have. Any surgeries you have had. Any medical conditions you have. Whether you are pregnant or may be pregnant. What are the risks? Generally, this is a safe procedure. However, problems may occur, including: Infection. Bleeding. Allergic reactions to anesthetic medicines. Damage to nerves or blood vessels. What happens before the procedure? Medicines Ask your health care provider about: Changing or stopping your regular medicines. This is especially important if you are taking diabetes medicines or blood thinners. Taking medicines such as aspirin and ibuprofen. These medicines can thin your blood. Do not take these medicines unless your health care provider tells you to take them. Taking over-the-counter medicines, vitamins, herbs, and supplements. Tests You will have: A physical exam. Blood tests. Imaging tests, including a chest X-ray. General instructions Follow instructions from your health care provider about eating or drinking restrictions. Ask your health care provider: How your surgery site will be marked. What steps will be taken to help prevent infection. These  steps may include: Removing hair at the surgery site. Washing skin with a germ-killing soap. Taking antibiotic medicine. If you will be going home right after the procedure, plan to have a responsible adult: Take you home from the hospital or clinic. You will not be allowed to drive. Care for you for the time you are told. What happens during the procedure? You may be given one or more of the following: A medicine to help you relax (sedative). A medicine to numb the area (local anesthetic). A small incision will be made at the site of your implanted port. The implanted port and the catheter that has been inside your vein will be gently removed. The port and catheter will be inspected to make sure that all the parts have been removed. Part of the catheter may be tested for bacteria. The incision will be closed with stitches (sutures), adhesive strips, or skin glue. A bandage (dressing) will be placed over the incision. The health care provider may apply gentle pressure over the dressing for about 5 minutes. The procedure may vary among health care providers and hospitals. What happens after the procedure? Your blood pressure, heart rate, breathing rate, and blood oxygen level will be monitored until you leave the hospital or clinic. You will be monitored to make sure that there is no bleeding from the site where the port was removed. If you were given a sedative during the procedure, it can affect you for several hours. Do not drive or operate machinery until your health care provider says that it is safe. Summary Implanted port removal is a procedure to remove the port and catheter that are implanted under your skin. Before the procedure, follow your health  care provider's instructions about changing or stopping your regular medicines. This is especially important if you are taking diabetes medicines or blood thinners. If you will be going home right after the procedure, plan to have a  responsible adult care for you for the time you are told. This information is not intended to replace advice given to you by your health care provider. Make sure you discuss any questions you have with your health care provider. Document Revised: 05/14/2021 Document Reviewed: 05/14/2021 Elsevier Patient Education  [place].

## 2022-06-12 ENCOUNTER — Other Ambulatory Visit: Payer: Self-pay | Admitting: *Deleted

## 2022-06-12 DIAGNOSIS — C9001 Multiple myeloma in remission: Secondary | ICD-10-CM

## 2022-06-12 DIAGNOSIS — C50911 Malignant neoplasm of unspecified site of right female breast: Secondary | ICD-10-CM

## 2022-06-12 DIAGNOSIS — C9000 Multiple myeloma not having achieved remission: Secondary | ICD-10-CM

## 2022-06-13 ENCOUNTER — Inpatient Hospital Stay: Payer: Medicare Other

## 2022-06-13 ENCOUNTER — Other Ambulatory Visit: Payer: Self-pay

## 2022-06-13 VITALS — BP 110/78 | HR 89 | Temp 98.1°F | Resp 17

## 2022-06-13 DIAGNOSIS — C50911 Malignant neoplasm of unspecified site of right female breast: Secondary | ICD-10-CM

## 2022-06-13 DIAGNOSIS — Z5111 Encounter for antineoplastic chemotherapy: Secondary | ICD-10-CM | POA: Diagnosis not present

## 2022-06-13 DIAGNOSIS — C9001 Multiple myeloma in remission: Secondary | ICD-10-CM

## 2022-06-13 DIAGNOSIS — C9000 Multiple myeloma not having achieved remission: Secondary | ICD-10-CM

## 2022-06-13 LAB — CBC WITH DIFFERENTIAL (CANCER CENTER ONLY)
Abs Immature Granulocytes: 0.07 10*3/uL (ref 0.00–0.07)
Basophils Absolute: 0 10*3/uL (ref 0.0–0.1)
Basophils Relative: 0 %
Eosinophils Absolute: 0.1 10*3/uL (ref 0.0–0.5)
Eosinophils Relative: 1 %
HCT: 40.8 % (ref 36.0–46.0)
Hemoglobin: 13.6 g/dL (ref 12.0–15.0)
Immature Granulocytes: 1 %
Lymphocytes Relative: 47 %
Lymphs Abs: 4.5 10*3/uL — ABNORMAL HIGH (ref 0.7–4.0)
MCH: 33.3 pg (ref 26.0–34.0)
MCHC: 33.3 g/dL (ref 30.0–36.0)
MCV: 99.8 fL (ref 80.0–100.0)
Monocytes Absolute: 0.5 10*3/uL (ref 0.1–1.0)
Monocytes Relative: 6 %
Neutro Abs: 4.3 10*3/uL (ref 1.7–7.7)
Neutrophils Relative %: 45 %
Platelet Count: 172 10*3/uL (ref 150–400)
RBC: 4.09 MIL/uL (ref 3.87–5.11)
RDW: 14.4 % (ref 11.5–15.5)
WBC Count: 9.5 10*3/uL (ref 4.0–10.5)
nRBC: 0 % (ref 0.0–0.2)

## 2022-06-13 LAB — CMP (CANCER CENTER ONLY)
ALT: 11 U/L (ref 0–44)
AST: 13 U/L — ABNORMAL LOW (ref 15–41)
Albumin: 4.2 g/dL (ref 3.5–5.0)
Alkaline Phosphatase: 56 U/L (ref 38–126)
Anion gap: 8 (ref 5–15)
BUN: 20 mg/dL (ref 8–23)
CO2: 27 mmol/L (ref 22–32)
Calcium: 9.3 mg/dL (ref 8.9–10.3)
Chloride: 104 mmol/L (ref 98–111)
Creatinine: 1.69 mg/dL — ABNORMAL HIGH (ref 0.44–1.00)
GFR, Estimated: 32 mL/min — ABNORMAL LOW (ref >60)
Glucose, Bld: 98 mg/dL (ref 70–99)
Potassium: 3.9 mmol/L (ref 3.5–5.1)
Sodium: 139 mmol/L (ref 135–145)
Total Bilirubin: 0.6 mg/dL (ref 0.3–1.2)
Total Protein: 6.2 g/dL — ABNORMAL LOW (ref 6.5–8.1)

## 2022-06-13 MED ORDER — DEXAMETHASONE 4 MG PO TABS
20.0000 mg | ORAL_TABLET | Freq: Once | ORAL | Status: AC
  Administered 2022-06-13: 20 mg via ORAL
  Filled 2022-06-13: qty 5

## 2022-06-13 MED ORDER — SODIUM CHLORIDE 0.9% FLUSH
10.0000 mL | INTRAVENOUS | Status: DC | PRN
  Administered 2022-06-13: 10 mL

## 2022-06-13 MED ORDER — DEXAMETHASONE 6 MG PO TABS: 12.0000 mg | ORAL_TABLET | Freq: Every day | ORAL | 1 refills | Status: DC

## 2022-06-13 MED ORDER — DARATUMUMAB-HYALURONIDASE-FIHJ 1800-30000 MG-UT/15ML ~~LOC~~ SOLN
1800.0000 mg | Freq: Once | SUBCUTANEOUS | Status: AC
  Administered 2022-06-13: 1800 mg via SUBCUTANEOUS
  Filled 2022-06-13: qty 15

## 2022-06-13 MED ORDER — BORTEZOMIB CHEMO SQ INJECTION 3.5 MG (2.5MG/ML)
1.3000 mg/m2 | Freq: Once | INTRAMUSCULAR | Status: AC
  Administered 2022-06-13: 2.75 mg via SUBCUTANEOUS
  Filled 2022-06-13: qty 1.1

## 2022-06-13 NOTE — Progress Notes (Signed)
Ok to treat with creatinine of 1.69 per Dr [name]. dph

## 2022-06-13 NOTE — Patient Instructions (Signed)
[facility]  Discharge Instructions: Thank you for choosing [facility] to provide your oncology and hematology care.   If you have a lab appointment with the [facility], please go directly to the [facility] and check in at the registration area.  Wear comfortable clothing and clothing appropriate for easy access to any Portacath or PICC line.   We strive to give you quality time with your provider. You may need to reschedule your appointment if you arrive late (15 or more minutes).  Arriving late affects you and other patients whose appointments are after yours.  Also, if you miss three or more appointments without notifying the office, you may be dismissed from the clinic at the provider's discretion.      For prescription refill requests, have your pharmacy contact our office and allow 72 hours for refills to be completed.    Today you received the following chemotherapy and/or immunotherapy agents Velcade and Darzalex   To help prevent nausea and vomiting after your treatment, we encourage you to take your nausea medication as directed.  BELOW ARE SYMPTOMS THAT SHOULD BE REPORTED IMMEDIATELY: *FEVER GREATER THAN 100.4 F (38 C) OR HIGHER *CHILLS OR SWEATING *NAUSEA AND VOMITING THAT IS NOT CONTROLLED WITH YOUR NAUSEA MEDICATION *UNUSUAL SHORTNESS OF BREATH *UNUSUAL BRUISING OR BLEEDING *URINARY PROBLEMS (pain or burning when urinating, or frequent urination) *BOWEL PROBLEMS (unusual diarrhea, constipation, pain near the anus) TENDERNESS IN MOUTH AND THROAT WITH OR WITHOUT PRESENCE OF ULCERS (sore throat, sores in mouth, or a toothache) UNUSUAL RASH, SWELLING OR PAIN  UNUSUAL VAGINAL DISCHARGE OR ITCHING   Items with * indicate a potential emergency and should be followed up as soon as possible or go to the Emergency Department if any problems should occur.  Please show the CHEMOTHERAPY ALERT CARD or IMMUNOTHERAPY ALERT CARD at check-in  to the Emergency Department and triage nurse. Should you have questions after your visit or need to cancel or reschedule your appointment, please contact [facility]  [phone] and follow the prompts.  Office hours are 8:00 a.m. to 4:30 p.m. Monday - Friday. Please note that voicemails left after 4:00 p.m. may not be returned until the following business day.  We are closed weekends and major holidays. You have access to a nurse at all times for urgent questions. Please call the main number to the clinic [phone] and follow the prompts.  For any non-urgent questions, you may also contact your provider using MyChart. We now offer e-Visits for anyone 73 and older to request care online for non-urgent symptoms. For details visit [email or web address].   Also download the MyChart app! Go to the app store, search "MyChart", open the app, select [facility], and log in with your MyChart username and password.  Masks are optional in the cancer centers. If you would like for your care team to wear a mask while they are taking care of you, please let them know. For doctor visits, patients may have with them one support person who is at least 73 years old. At this time, visitors are not allowed in the infusion area.

## 2022-06-13 NOTE — Patient Instructions (Signed)

## 2022-06-16 ENCOUNTER — Other Ambulatory Visit: Payer: Self-pay

## 2022-06-19 ENCOUNTER — Other Ambulatory Visit: Payer: Self-pay

## 2022-06-20 ENCOUNTER — Inpatient Hospital Stay: Payer: Medicare Other

## 2022-06-20 ENCOUNTER — Other Ambulatory Visit: Payer: Self-pay

## 2022-06-20 ENCOUNTER — Inpatient Hospital Stay (HOSPITAL_BASED_OUTPATIENT_CLINIC_OR_DEPARTMENT_OTHER): Payer: Medicare Other | Admitting: Hematology & Oncology

## 2022-06-20 ENCOUNTER — Encounter: Payer: Self-pay | Admitting: Hematology & Oncology

## 2022-06-20 VITALS — BP 155/76 | HR 87 | Temp 98.1°F | Resp 18 | Wt 231.0 lb

## 2022-06-20 DIAGNOSIS — C9001 Multiple myeloma in remission: Secondary | ICD-10-CM

## 2022-06-20 DIAGNOSIS — Z5111 Encounter for antineoplastic chemotherapy: Secondary | ICD-10-CM | POA: Diagnosis not present

## 2022-06-20 DIAGNOSIS — C50911 Malignant neoplasm of unspecified site of right female breast: Secondary | ICD-10-CM

## 2022-06-20 LAB — CBC WITH DIFFERENTIAL (CANCER CENTER ONLY)
Abs Immature Granulocytes: 0.04 10*3/uL (ref 0.00–0.07)
Basophils Absolute: 0 10*3/uL (ref 0.0–0.1)
Basophils Relative: 0 %
Eosinophils Absolute: 0.1 10*3/uL (ref 0.0–0.5)
Eosinophils Relative: 1 %
HCT: 38.5 % (ref 36.0–46.0)
Hemoglobin: 12.6 g/dL (ref 12.0–15.0)
Immature Granulocytes: 0 %
Lymphocytes Relative: 50 %
Lymphs Abs: 4.5 10*3/uL — ABNORMAL HIGH (ref 0.7–4.0)
MCH: 32.9 pg (ref 26.0–34.0)
MCHC: 32.7 g/dL (ref 30.0–36.0)
MCV: 100.5 fL — ABNORMAL HIGH (ref 80.0–100.0)
Monocytes Absolute: 0.6 10*3/uL (ref 0.1–1.0)
Monocytes Relative: 6 %
Neutro Abs: 4 10*3/uL (ref 1.7–7.7)
Neutrophils Relative %: 43 %
Platelet Count: 144 10*3/uL — ABNORMAL LOW (ref 150–400)
RBC: 3.83 MIL/uL — ABNORMAL LOW (ref 3.87–5.11)
RDW: 14.4 % (ref 11.5–15.5)
WBC Count: 9.2 10*3/uL (ref 4.0–10.5)
nRBC: 0 % (ref 0.0–0.2)

## 2022-06-20 LAB — CMP (CANCER CENTER ONLY)
ALT: 10 U/L (ref 0–44)
AST: 11 U/L — ABNORMAL LOW (ref 15–41)
Albumin: 4 g/dL (ref 3.5–5.0)
Alkaline Phosphatase: 52 U/L (ref 38–126)
Anion gap: 9 (ref 5–15)
BUN: 22 mg/dL (ref 8–23)
CO2: 27 mmol/L (ref 22–32)
Calcium: 9.3 mg/dL (ref 8.9–10.3)
Chloride: 102 mmol/L (ref 98–111)
Creatinine: 1.34 mg/dL — ABNORMAL HIGH (ref 0.44–1.00)
GFR, Estimated: 42 mL/min — ABNORMAL LOW (ref >60)
Glucose, Bld: 96 mg/dL (ref 70–99)
Potassium: 4.1 mmol/L (ref 3.5–5.1)
Sodium: 138 mmol/L (ref 135–145)
Total Bilirubin: 0.7 mg/dL (ref 0.3–1.2)
Total Protein: 5.9 g/dL — ABNORMAL LOW (ref 6.5–8.1)

## 2022-06-20 LAB — LACTATE DEHYDROGENASE: LDH: 125 U/L (ref 98–192)

## 2022-06-20 MED ORDER — BORTEZOMIB CHEMO SQ INJECTION 3.5 MG (2.5MG/ML)
1.3000 mg/m2 | Freq: Once | INTRAMUSCULAR | Status: AC
  Administered 2022-06-20: 2.75 mg via SUBCUTANEOUS
  Filled 2022-06-20: qty 1.1

## 2022-06-20 MED ORDER — AMOXICILLIN 500 MG PO CAPS: 2000.0000 mg | ORAL_CAPSULE | Freq: Once | ORAL | 0 refills | Status: AC

## 2022-06-20 MED ORDER — DEXAMETHASONE 4 MG PO TABS: 20.0000 mg | ORAL_TABLET | Freq: Once | ORAL | Status: DC

## 2022-06-20 NOTE — Progress Notes (Signed)
Hematology and Oncology Follow Up Visit  Marcia [name] [id number] 12/02/1950 71 y.o. 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  So far, things are going pretty well for her.  She is actually done pretty well with treatment.  I think her last light chains will come down a little bit.  Last time we saw her, the Kappa light chain she is doing pretty well.  She really has no specific complaints.  I am just happy that she seems to be responding to the Faspro and Velcade.  When we last checked her light chain level, the Kappa light chain was down to 3.9 mg/dL.    She did have an ingrown toenail taken off.  This was of her right first toe.  She is going on the left first toe taken off in a week or so.  She continues on low-dose Eliquis.  She is doing well with this.  There is no problems with recurrent thromboembolic disease.  She has had no change in bowel or bladder habits.  She has had no rashes.  There is been no leg swelling.  She has had no fever.  There is been no headache.  Overall, I would say her performance status is probably ECOG 1.  Medications:  Allergies as of 06/20/2022       Reactions   Bactrim [sulfamethoxazole-trimethoprim] Other (See Comments)   ? Renal failure.   Heparin Other (See Comments)   HIT   Zofran  [ondansetron] Shortness Of Breath   "felt like throat was closing"   Clarithromycin Diarrhea, Nausea And Vomiting   Latex Rash   Macrodantin [nitrofurantoin] Rash        Medication List        Accurate as of June 20, 2022 10:49 AM. If you have any questions, ask your nurse or doctor.          acetaminophen 325 MG tablet Commonly known as: TYLENOL Take 650 mg by mouth daily as needed for moderate pain or headache.   amoxicillin 500 MG capsule Commonly known as: AMOXIL Prior to dentist   dexamethasone 6 MG tablet Commonly known as: DECADRON Take 2 tablets (12 mg total) by mouth daily. Take 30 minutes prior to treatment.   Eliquis 2.5 MG Tabs tablet Generic drug: apixaban TAKE 1 TABLET BY MOUTH TWICE A DAY   famciclovir 250 MG tablet Commonly known as: FAMVIR TAKE 1 TABLET BY MOUTH EVERY DAY   feeding supplement Liqd Take 237 mLs by mouth 3 (three) times daily between meals. What changed: when to take this   folic acid 1 MG tablet Commonly known as: FOLVITE TAKE 2 TABLETS (2 MG TOTAL) BY MOUTH DAILY.   lidocaine-prilocaine cream Commonly known as: EMLA Apply 1 application topically as needed (local anesthetic).   losartan 25 MG tablet Commonly known as: COZAAR Take 25 mg by mouth daily.  montelukast 10 MG tablet Commonly known as: Singulair Take 1 tablet (10 mg total) by mouth at bedtime. Start the Singulair on 03/14/2022        Allergies:  Allergies  Allergen Reactions   Bactrim [Sulfamethoxazole-Trimethoprim] Other (See Comments)    ? Renal failure.   Heparin Other (See Comments)    HIT   Zofran [Ondansetron] Shortness Of Breath    "felt like throat was closing"   Clarithromycin Diarrhea and Nausea And Vomiting   Latex Rash   Macrodantin [Nitrofurantoin] Rash    Past Medical History, Surgical history, Social history, and Family History were reviewed and updated.  Review of Systems: Review of Systems  Constitutional: Negative.   HENT:  Negative.    Eyes: Negative.   Respiratory: Negative.    Cardiovascular: Negative.   Gastrointestinal: Negative.   Genitourinary: Negative.   Musculoskeletal: Negative.   Skin: Negative.   Neurological: Negative.   Endo/Heme/Allergies: Negative.   Psychiatric/Behavioral: Negative.       Physical Exam:  weight is 231 lb (104.8 kg). Her oral temperature is 98.1 F (36.7 C). Her blood pressure is 155/76 (abnormal) and her pulse is 87. Her respiration is 18 and oxygen saturation is 98%.   Wt Readings from Last 3 Encounters:  06/20/22 231 lb (104.8 kg)  05/13/22 230 lb (104.3 kg)  04/08/22 229 lb (103.9 kg)    Physical Exam Vitals reviewed.  HENT:     Head: Normocephalic and atraumatic.  Eyes:     Pupils: Pupils are equal, round, and reactive to light.  Cardiovascular:     Rate and Rhythm: Normal rate and regular rhythm.     Heart sounds: Normal heart sounds.  Pulmonary:     Effort: Pulmonary effort is normal.     Breath sounds: Normal breath sounds.  Abdominal:     General: Bowel sounds are normal.     Palpations: Abdomen is soft.  Musculoskeletal:        General: No tenderness or deformity. Normal range of motion.     Cervical back: Normal range of motion.  Lymphadenopathy:     Cervical: No cervical adenopathy.  Skin:    General: Skin is warm and dry.     Findings: No erythema or rash.  Neurological:     Mental Status: She is alert and oriented to person, place, and time.  Psychiatric:        Behavior: Behavior normal.        Thought Content: Thought content normal.        Judgment: Judgment normal.     Lab Results  Component Value Date   WBC 9.2 06/20/2022   HGB 12.6 06/20/2022   HCT 38.5 06/20/2022   MCV 100.5 (H) 06/20/2022   PLT 144 (L) 06/20/2022   Lab Results  Component Value Date   FERRITIN 820 (H) 05/17/2018   IRON 272 (H) 05/17/2018   TIBC 295 05/17/2018   UIBC 23 05/17/2018   IRONPCTSAT 92 (H) 05/17/2018   Lab Results  Component Value  Date   RETICCTPCT 1.3 03/27/2011   RBC 3.83 (L) 06/20/2022   RETICCTABS 56.4 03/27/2011   Lab Results  Component Value Date   KPAFRELGTCHN 39.3 (H) 05/13/2022   LAMBDASER 8.5 05/13/2022   KAPLAMBRATIO 4.62 (H) 05/13/2022   Lab Results  Component Value Date   IGGSERUM 426 (L) 05/13/2022   IGA 21 (L) 05/13/2022   IGMSERUM 30 05/13/2022   Lab Results  Component Value Date   TOTALPROTELP 5.4 (L) 05/13/2022  ALBUMINELP 3.1 05/13/2022   A1GS 0.2 05/13/2022   A2GS 0.7 05/13/2022   BETS 1.0 05/13/2022   BETA2SER 0.4 07/02/2015   GAMS 0.4 05/13/2022   MSPIKE 0.1 (H) 05/13/2022   SPEI Comment 04/08/2022     Chemistry      Component Value Date/Time   NA 138 06/20/2022 0924   NA 141 07/06/2017 0816   NA 141 01/05/2017 0956   K 4.1 06/20/2022 0924   K 3.6 07/06/2017 0816   K 4.3 01/05/2017 0956   CL 102 06/20/2022 0924   CL 101 07/06/2017 0816   CO2 27 06/20/2022 0924   CO2 28 07/06/2017 0816   CO2 23 01/05/2017 0956   BUN 22 06/20/2022 0924   BUN 28 (H) 07/06/2017 0816   BUN 22.4 01/05/2017 0956   CREATININE 1.34 (H) 06/20/2022 0924   CREATININE 1.6 (H) 07/06/2017 0816   CREATININE 1.4 (H) 01/05/2017 0956      Component Value Date/Time   CALCIUM 9.3 06/20/2022 0924   CALCIUM 9.1 07/06/2017 0816   CALCIUM 9.3 01/05/2017 0956   ALKPHOS 52 06/20/2022 0924   ALKPHOS 58 07/06/2017 0816   ALKPHOS 52 01/05/2017 0956   AST 11 (L) 06/20/2022 0924   AST 14 01/05/2017 0956   ALT 10 06/20/2022 0924   ALT 58 (H) 07/06/2017 0816   ALT 13 01/05/2017 0956   BILITOT 0.7 06/20/2022 0924   BILITOT 0.56 01/05/2017 0956       Impression and Plan: Ms. Miyamoto is a very pleasant 71 yo caucasian female with kappa light chain myeloma that has recurred again.  She seems to be doing well with the Faspro and Velcade.  I am happy about this.  We will see what her light chain studies look like.  As far as her breast cancer, this really is not an issue right now.  Again we still have to  monitor this..  She does have a history of pulmonary embolism.  I do have her on low-dose Eliquis as a long-term maintenance.  I think she is doing well with the protocol right now.  Hopefully, if we continue to see improvement, we can start to spread her treatments out little bit more.  I will plan to get her back to see me in about 4 5 weeks.  [name], MD 7/28/202310:49 AM

## 2022-06-20 NOTE — Patient Instructions (Signed)

## 2022-06-20 NOTE — Patient Instructions (Signed)
[facility]  Discharge Instructions: Thank you for choosing [facility] to provide your oncology and hematology care.   If you have a lab appointment with the [facility], please go directly to the [facility] and check in at the registration area.  Wear comfortable clothing and clothing appropriate for easy access to any Portacath or PICC line.   We strive to give you quality time with your provider. You may need to reschedule your appointment if you arrive late (15 or more minutes).  Arriving late affects you and other patients whose appointments are after yours.  Also, if you miss three or more appointments without notifying the office, you may be dismissed from the clinic at the provider's discretion.      For prescription refill requests, have your pharmacy contact our office and allow 72 hours for refills to be completed.    Today you received the following chemotherapy and/or immunotherapy agents Velcade       To help prevent nausea and vomiting after your treatment, we encourage you to take your nausea medication as directed.  BELOW ARE SYMPTOMS THAT SHOULD BE REPORTED IMMEDIATELY: *FEVER GREATER THAN 100.4 F (38 C) OR HIGHER *CHILLS OR SWEATING *NAUSEA AND VOMITING THAT IS NOT CONTROLLED WITH YOUR NAUSEA MEDICATION *UNUSUAL SHORTNESS OF BREATH *UNUSUAL BRUISING OR BLEEDING *URINARY PROBLEMS (pain or burning when urinating, or frequent urination) *BOWEL PROBLEMS (unusual diarrhea, constipation, pain near the anus) TENDERNESS IN MOUTH AND THROAT WITH OR WITHOUT PRESENCE OF ULCERS (sore throat, sores in mouth, or a toothache) UNUSUAL RASH, SWELLING OR PAIN  UNUSUAL VAGINAL DISCHARGE OR ITCHING   Items with * indicate a potential emergency and should be followed up as soon as possible or go to the Emergency Department if any problems should occur.  Please show the CHEMOTHERAPY ALERT CARD or IMMUNOTHERAPY ALERT CARD at check-in to the  Emergency Department and triage nurse. Should you have questions after your visit or need to cancel or reschedule your appointment, please contact [facility]  [phone] and follow the prompts.  Office hours are 8:00 a.m. to 4:30 p.m. Monday - Friday. Please note that voicemails left after 4:00 p.m. may not be returned until the following business day.  We are closed weekends and major holidays. You have access to a nurse at all times for urgent questions. Please call the main number to the clinic [phone] and follow the prompts.  For any non-urgent questions, you may also contact your provider using MyChart. We now offer e-Visits for anyone 82 and older to request care online for non-urgent symptoms. For details visit [email or web address].   Also download the MyChart app! Go to the app store, search "MyChart", open the app, select [facility], and log in with your MyChart username and password.  Masks are optional in the cancer centers. If you would like for your care team to wear a mask while they are taking care of you, please let them know. For doctor visits, patients may have with them one support person who is at least 73 years old. At this time, visitors are not allowed in the infusion area.

## 2022-06-21 ENCOUNTER — Other Ambulatory Visit: Payer: Self-pay

## 2022-06-21 LAB — IGG, IGA, IGM
IgA: 22 mg/dL — ABNORMAL LOW (ref 64–422)
IgG (Immunoglobin G), Serum: 393 mg/dL — ABNORMAL LOW (ref 586–1602)
IgM (Immunoglobulin M), Srm: 24 mg/dL — ABNORMAL LOW (ref 26–217)

## 2022-06-22 ENCOUNTER — Other Ambulatory Visit: Payer: Self-pay

## 2022-06-23 ENCOUNTER — Other Ambulatory Visit: Payer: Self-pay

## 2022-06-23 LAB — KAPPA/LAMBDA LIGHT CHAINS
Kappa free light chain: 39.3 mg/L — ABNORMAL HIGH (ref 3.3–19.4)
Kappa, lambda light chain ratio: 5.04 — ABNORMAL HIGH (ref 0.26–1.65)
Lambda free light chains: 7.8 mg/L (ref 5.7–26.3)

## 2022-06-24 ENCOUNTER — Other Ambulatory Visit: Payer: Self-pay

## 2022-06-24 LAB — PROTEIN ELECTROPHORESIS, SERUM, WITH REFLEX
A/G Ratio: 1.4 (ref 0.7–1.7)
Albumin ELP: 3.3 g/dL (ref 2.9–4.4)
Alpha-1-Globulin: 0.2 g/dL (ref 0.0–0.4)
Alpha-2-Globulin: 0.8 g/dL (ref 0.4–1.0)
Beta Globulin: 1 g/dL (ref 0.7–1.3)
Gamma Globulin: 0.4 g/dL (ref 0.4–1.8)
Globulin, Total: 2.3 g/dL (ref 2.2–3.9)
Total Protein ELP: 5.6 g/dL — ABNORMAL LOW (ref 6.0–8.5)

## 2022-06-27 ENCOUNTER — Other Ambulatory Visit: Payer: Self-pay

## 2022-06-30 ENCOUNTER — Other Ambulatory Visit: Payer: Self-pay | Admitting: *Deleted

## 2022-06-30 DIAGNOSIS — C50911 Malignant neoplasm of unspecified site of right female breast: Secondary | ICD-10-CM

## 2022-06-30 DIAGNOSIS — C9001 Multiple myeloma in remission: Secondary | ICD-10-CM

## 2022-07-01 ENCOUNTER — Inpatient Hospital Stay: Payer: Medicare Other

## 2022-07-01 ENCOUNTER — Inpatient Hospital Stay: Payer: Medicare Other | Attending: Hematology & Oncology

## 2022-07-01 VITALS — BP 126/66 | HR 86

## 2022-07-01 DIAGNOSIS — C50911 Malignant neoplasm of unspecified site of right female breast: Secondary | ICD-10-CM | POA: Insufficient documentation

## 2022-07-01 DIAGNOSIS — Z86711 Personal history of pulmonary embolism: Secondary | ICD-10-CM | POA: Diagnosis not present

## 2022-07-01 DIAGNOSIS — C9000 Multiple myeloma not having achieved remission: Secondary | ICD-10-CM | POA: Diagnosis not present

## 2022-07-01 DIAGNOSIS — Z171 Estrogen receptor negative status [ER-]: Secondary | ICD-10-CM | POA: Diagnosis not present

## 2022-07-01 DIAGNOSIS — C9001 Multiple myeloma in remission: Secondary | ICD-10-CM

## 2022-07-01 DIAGNOSIS — Z86718 Personal history of other venous thrombosis and embolism: Secondary | ICD-10-CM | POA: Insufficient documentation

## 2022-07-01 DIAGNOSIS — Z5112 Encounter for antineoplastic immunotherapy: Secondary | ICD-10-CM | POA: Insufficient documentation

## 2022-07-01 DIAGNOSIS — Z5111 Encounter for antineoplastic chemotherapy: Secondary | ICD-10-CM | POA: Diagnosis present

## 2022-07-01 LAB — CBC WITH DIFFERENTIAL (CANCER CENTER ONLY)
Abs Immature Granulocytes: 0.03 10*3/uL (ref 0.00–0.07)
Basophils Absolute: 0 10*3/uL (ref 0.0–0.1)
Basophils Relative: 0 %
Eosinophils Absolute: 0.1 10*3/uL (ref 0.0–0.5)
Eosinophils Relative: 1 %
HCT: 38.9 % (ref 36.0–46.0)
Hemoglobin: 12.7 g/dL (ref 12.0–15.0)
Immature Granulocytes: 0 %
Lymphocytes Relative: 49 %
Lymphs Abs: 3.8 10*3/uL (ref 0.7–4.0)
MCH: 32.9 pg (ref 26.0–34.0)
MCHC: 32.6 g/dL (ref 30.0–36.0)
MCV: 100.8 fL — ABNORMAL HIGH (ref 80.0–100.0)
Monocytes Absolute: 0.5 10*3/uL (ref 0.1–1.0)
Monocytes Relative: 6 %
Neutro Abs: 3.4 10*3/uL (ref 1.7–7.7)
Neutrophils Relative %: 44 %
Platelet Count: 198 10*3/uL (ref 150–400)
RBC: 3.86 MIL/uL — ABNORMAL LOW (ref 3.87–5.11)
RDW: 14.5 % (ref 11.5–15.5)
WBC Count: 7.8 10*3/uL (ref 4.0–10.5)
nRBC: 0 % (ref 0.0–0.2)

## 2022-07-01 LAB — CMP (CANCER CENTER ONLY)
ALT: 9 U/L (ref 0–44)
AST: 11 U/L — ABNORMAL LOW (ref 15–41)
Albumin: 4.1 g/dL (ref 3.5–5.0)
Alkaline Phosphatase: 47 U/L (ref 38–126)
Anion gap: 11 (ref 5–15)
BUN: 25 mg/dL — ABNORMAL HIGH (ref 8–23)
CO2: 25 mmol/L (ref 22–32)
Calcium: 9.3 mg/dL (ref 8.9–10.3)
Chloride: 103 mmol/L (ref 98–111)
Creatinine: 1.39 mg/dL — ABNORMAL HIGH (ref 0.44–1.00)
GFR, Estimated: 40 mL/min — ABNORMAL LOW (ref >60)
Glucose, Bld: 102 mg/dL — ABNORMAL HIGH (ref 70–99)
Potassium: 3.9 mmol/L (ref 3.5–5.1)
Sodium: 139 mmol/L (ref 135–145)
Total Bilirubin: 0.7 mg/dL (ref 0.3–1.2)
Total Protein: 5.8 g/dL — ABNORMAL LOW (ref 6.5–8.1)

## 2022-07-01 MED ORDER — DEXAMETHASONE 4 MG PO TABS: 20.0000 mg | ORAL_TABLET | Freq: Once | ORAL | Status: DC

## 2022-07-01 MED ORDER — ACETAMINOPHEN 325 MG PO TABS: 650.0000 mg | ORAL_TABLET | Freq: Once | ORAL | Status: DC

## 2022-07-01 MED ORDER — DARATUMUMAB-HYALURONIDASE-FIHJ 1800-30000 MG-UT/15ML ~~LOC~~ SOLN
1800.0000 mg | Freq: Once | SUBCUTANEOUS | Status: AC
  Administered 2022-07-01: 1800 mg via SUBCUTANEOUS
  Filled 2022-07-01: qty 15

## 2022-07-01 MED ORDER — DIPHENHYDRAMINE HCL 25 MG PO CAPS: 50.0000 mg | ORAL_CAPSULE | Freq: Once | ORAL | Status: DC

## 2022-07-01 MED ORDER — BORTEZOMIB CHEMO SQ INJECTION 3.5 MG (2.5MG/ML)
1.3000 mg/m2 | Freq: Once | INTRAMUSCULAR | Status: AC
  Administered 2022-07-01: 2.75 mg via SUBCUTANEOUS
  Filled 2022-07-01: qty 1.1

## 2022-07-01 NOTE — Patient Instructions (Signed)
[facility]  Discharge Instructions: Thank you for choosing [facility] to provide your oncology and hematology care.   If you have a lab appointment with the [facility], please go directly to the [facility] and check in at the registration area.  Wear comfortable clothing and clothing appropriate for easy access to any Portacath or PICC line.   We strive to give you quality time with your provider. You may need to reschedule your appointment if you arrive late (15 or more minutes).  Arriving late affects you and other patients whose appointments are after yours.  Also, if you miss three or more appointments without notifying the office, you may be dismissed from the clinic at the provider's discretion.      For prescription refill requests, have your pharmacy contact our office and allow 72 hours for refills to be completed.    Today you received the following chemotherapy and/or immunotherapy agents fastpro, velcade      To help prevent nausea and vomiting after your treatment, we encourage you to take your nausea medication as directed.  BELOW ARE SYMPTOMS THAT SHOULD BE REPORTED IMMEDIATELY: *FEVER GREATER THAN 100.4 F (38 C) OR HIGHER *CHILLS OR SWEATING *NAUSEA AND VOMITING THAT IS NOT CONTROLLED WITH YOUR NAUSEA MEDICATION *UNUSUAL SHORTNESS OF BREATH *UNUSUAL BRUISING OR BLEEDING *URINARY PROBLEMS (pain or burning when urinating, or frequent urination) *BOWEL PROBLEMS (unusual diarrhea, constipation, pain near the anus) TENDERNESS IN MOUTH AND THROAT WITH OR WITHOUT PRESENCE OF ULCERS (sore throat, sores in mouth, or a toothache) UNUSUAL RASH, SWELLING OR PAIN  UNUSUAL VAGINAL DISCHARGE OR ITCHING   Items with * indicate a potential emergency and should be followed up as soon as possible or go to the Emergency Department if any problems should occur.  Please show the CHEMOTHERAPY ALERT CARD or IMMUNOTHERAPY ALERT CARD at check-in  to the Emergency Department and triage nurse. Should you have questions after your visit or need to cancel or reschedule your appointment, please contact [facility]  [phone] and follow the prompts.  Office hours are 8:00 a.m. to 4:30 p.m. Monday - Friday. Please note that voicemails left after 4:00 p.m. may not be returned until the following business day.  We are closed weekends and major holidays. You have access to a nurse at all times for urgent questions. Please call the main number to the clinic [phone] and follow the prompts.  For any non-urgent questions, you may also contact your provider using MyChart. We now offer e-Visits for anyone 71 and older to request care online for non-urgent symptoms. For details visit [email or web address].   Also download the MyChart app! Go to the app store, search "MyChart", open the app, select [facility], and log in with your MyChart username and password.  Masks are optional in the cancer centers. If you would like for your care team to wear a mask while they are taking care of you, please let them know. You may have one support person who is at least 73 years old accompany you for your appointments.

## 2022-07-01 NOTE — Patient Instructions (Signed)

## 2022-07-07 ENCOUNTER — Other Ambulatory Visit: Payer: Self-pay | Admitting: *Deleted

## 2022-07-07 ENCOUNTER — Other Ambulatory Visit: Payer: Self-pay

## 2022-07-07 DIAGNOSIS — C9001 Multiple myeloma in remission: Secondary | ICD-10-CM

## 2022-07-08 ENCOUNTER — Inpatient Hospital Stay: Payer: Medicare Other

## 2022-07-08 DIAGNOSIS — C9001 Multiple myeloma in remission: Secondary | ICD-10-CM

## 2022-07-08 DIAGNOSIS — Z5111 Encounter for antineoplastic chemotherapy: Secondary | ICD-10-CM | POA: Diagnosis not present

## 2022-07-08 DIAGNOSIS — C50911 Malignant neoplasm of unspecified site of right female breast: Secondary | ICD-10-CM

## 2022-07-08 LAB — COMPREHENSIVE METABOLIC PANEL
ALT: 11 U/L (ref 0–44)
AST: 12 U/L — ABNORMAL LOW (ref 15–41)
Albumin: 4.2 g/dL (ref 3.5–5.0)
Alkaline Phosphatase: 52 U/L (ref 38–126)
Anion gap: 11 (ref 5–15)
BUN: 24 mg/dL — ABNORMAL HIGH (ref 8–23)
CO2: 25 mmol/L (ref 22–32)
Calcium: 9.2 mg/dL (ref 8.9–10.3)
Chloride: 103 mmol/L (ref 98–111)
Creatinine, Ser: 1.39 mg/dL — ABNORMAL HIGH (ref 0.44–1.00)
GFR, Estimated: 40 mL/min — ABNORMAL LOW (ref >60)
Glucose, Bld: 101 mg/dL — ABNORMAL HIGH (ref 70–99)
Potassium: 3.9 mmol/L (ref 3.5–5.1)
Sodium: 139 mmol/L (ref 135–145)
Total Bilirubin: 0.8 mg/dL (ref 0.3–1.2)
Total Protein: 6.6 g/dL (ref 6.5–8.1)

## 2022-07-08 LAB — CBC WITH DIFFERENTIAL (CANCER CENTER ONLY)
Abs Immature Granulocytes: 0.03 10*3/uL (ref 0.00–0.07)
Basophils Absolute: 0.1 10*3/uL (ref 0.0–0.1)
Basophils Relative: 1 %
Eosinophils Absolute: 0.1 10*3/uL (ref 0.0–0.5)
Eosinophils Relative: 1 %
HCT: 41 % (ref 36.0–46.0)
Hemoglobin: 13.3 g/dL (ref 12.0–15.0)
Immature Granulocytes: 0 %
Lymphocytes Relative: 48 %
Lymphs Abs: 4.5 10*3/uL — ABNORMAL HIGH (ref 0.7–4.0)
MCH: 32.5 pg (ref 26.0–34.0)
MCHC: 32.4 g/dL (ref 30.0–36.0)
MCV: 100.2 fL — ABNORMAL HIGH (ref 80.0–100.0)
Monocytes Absolute: 0.6 10*3/uL (ref 0.1–1.0)
Monocytes Relative: 6 %
Neutro Abs: 4.1 10*3/uL (ref 1.7–7.7)
Neutrophils Relative %: 44 %
Platelet Count: 157 10*3/uL (ref 150–400)
RBC: 4.09 MIL/uL (ref 3.87–5.11)
RDW: 14.2 % (ref 11.5–15.5)
WBC Count: 9.4 10*3/uL (ref 4.0–10.5)
nRBC: 0 % (ref 0.0–0.2)

## 2022-07-08 MED ORDER — DEXAMETHASONE 4 MG PO TABS: 20.0000 mg | ORAL_TABLET | Freq: Once | ORAL | Status: DC

## 2022-07-08 MED ORDER — BORTEZOMIB CHEMO SQ INJECTION 3.5 MG (2.5MG/ML)
1.3000 mg/m2 | Freq: Once | INTRAMUSCULAR | Status: AC
  Administered 2022-07-08: 2.75 mg via SUBCUTANEOUS
  Filled 2022-07-08: qty 1.1

## 2022-07-08 NOTE — Patient Instructions (Signed)
[facility]  Discharge Instructions: Thank you for choosing [facility] to provide your oncology and hematology care.   If you have a lab appointment with the Cancer Center, please go directly to the Cancer Center and check in at the registration area.  Wear comfortable clothing and clothing appropriate for easy access to any Portacath or PICC line.   We strive to give you quality time with your provider. You may need to reschedule your appointment if you arrive late (15 or more minutes).  Arriving late affects you and other patients whose appointments are after yours.  Also, if you miss three or more appointments without notifying the office, you may be dismissed from the clinic at the provider's discretion.      For prescription refill requests, have your pharmacy contact our office and allow 72 hours for refills to be completed.    Today you received the following chemotherapy and/or immunotherapy agents Velcade      To help prevent nausea and vomiting after your treatment, we encourage you to take your nausea medication as directed.  BELOW ARE SYMPTOMS THAT SHOULD BE REPORTED IMMEDIATELY: *FEVER GREATER THAN 100.4 F (38 C) OR HIGHER *CHILLS OR SWEATING *NAUSEA AND VOMITING THAT IS NOT CONTROLLED WITH YOUR NAUSEA MEDICATION *UNUSUAL SHORTNESS OF BREATH *UNUSUAL BRUISING OR BLEEDING *URINARY PROBLEMS (pain or burning when urinating, or frequent urination) *BOWEL PROBLEMS (unusual diarrhea, constipation, pain near the anus) TENDERNESS IN MOUTH AND THROAT WITH OR WITHOUT PRESENCE OF ULCERS (sore throat, sores in mouth, or a toothache) UNUSUAL RASH, SWELLING OR PAIN  UNUSUAL VAGINAL DISCHARGE OR ITCHING   Items with * indicate a potential emergency and should be followed up as soon as possible or go to the Emergency Department if any problems should occur.  Please show the CHEMOTHERAPY ALERT CARD or IMMUNOTHERAPY ALERT CARD at check-in to the  Emergency Department and triage nurse. Should you have questions after your visit or need to cancel or reschedule your appointment, please contact [facility]  [phone] and follow the prompts.  Office hours are 8:00 a.m. to 4:30 p.m. Monday - Friday. Please note that voicemails left after 4:00 p.m. may not be returned until the following business day.  We are closed weekends and major holidays. You have access to a nurse at all times for urgent questions. Please call the main number to the clinic [phone] and follow the prompts.  For any non-urgent questions, you may also contact your provider using MyChart. We now offer e-Visits for anyone 18 and older to request care online for non-urgent symptoms. For details visit [email or web address].   Also download the MyChart app! Go to the app store, search "MyChart", open the app, select [facility], and log in with your MyChart username and password.  Masks are optional in the cancer centers. If you would like for your care team to wear a mask while they are taking care of you, please let them know. You may have one support person who is at least 73 years old accompany you for your appointments. 

## 2022-07-08 NOTE — Patient Instructions (Signed)

## 2022-07-22 ENCOUNTER — Inpatient Hospital Stay (HOSPITAL_BASED_OUTPATIENT_CLINIC_OR_DEPARTMENT_OTHER): Payer: Medicare Other | Admitting: Family

## 2022-07-22 ENCOUNTER — Inpatient Hospital Stay: Payer: Medicare Other

## 2022-07-22 ENCOUNTER — Encounter: Payer: Self-pay | Admitting: Family

## 2022-07-22 VITALS — BP 141/59 | HR 98 | Temp 97.9°F | Resp 19 | Ht 58.5 in | Wt 229.0 lb

## 2022-07-22 DIAGNOSIS — C50911 Malignant neoplasm of unspecified site of right female breast: Secondary | ICD-10-CM

## 2022-07-22 DIAGNOSIS — I2601 Septic pulmonary embolism with acute cor pulmonale: Secondary | ICD-10-CM

## 2022-07-22 DIAGNOSIS — I824Z3 Acute embolism and thrombosis of unspecified deep veins of distal lower extremity, bilateral: Secondary | ICD-10-CM

## 2022-07-22 DIAGNOSIS — C9001 Multiple myeloma in remission: Secondary | ICD-10-CM

## 2022-07-22 DIAGNOSIS — C9000 Multiple myeloma not having achieved remission: Secondary | ICD-10-CM

## 2022-07-22 DIAGNOSIS — Z5111 Encounter for antineoplastic chemotherapy: Secondary | ICD-10-CM | POA: Diagnosis not present

## 2022-07-22 LAB — CMP (CANCER CENTER ONLY)
ALT: 11 U/L (ref 0–44)
AST: 13 U/L — ABNORMAL LOW (ref 15–41)
Albumin: 4.1 g/dL (ref 3.5–5.0)
Alkaline Phosphatase: 57 U/L (ref 38–126)
Anion gap: 9 (ref 5–15)
BUN: 24 mg/dL — ABNORMAL HIGH (ref 8–23)
CO2: 27 mmol/L (ref 22–32)
Calcium: 9.7 mg/dL (ref 8.9–10.3)
Chloride: 103 mmol/L (ref 98–111)
Creatinine: 1.49 mg/dL — ABNORMAL HIGH (ref 0.44–1.00)
GFR, Estimated: 37 mL/min — ABNORMAL LOW (ref >60)
Glucose, Bld: 105 mg/dL — ABNORMAL HIGH (ref 70–99)
Potassium: 4 mmol/L (ref 3.5–5.1)
Sodium: 139 mmol/L (ref 135–145)
Total Bilirubin: 0.7 mg/dL (ref 0.3–1.2)
Total Protein: 6.5 g/dL (ref 6.5–8.1)

## 2022-07-22 LAB — CBC WITH DIFFERENTIAL (CANCER CENTER ONLY)
Abs Immature Granulocytes: 0.04 10*3/uL (ref 0.00–0.07)
Basophils Absolute: 0 10*3/uL (ref 0.0–0.1)
Basophils Relative: 0 %
Eosinophils Absolute: 0.1 10*3/uL (ref 0.0–0.5)
Eosinophils Relative: 1 %
HCT: 40.4 % (ref 36.0–46.0)
Hemoglobin: 13.3 g/dL (ref 12.0–15.0)
Immature Granulocytes: 0 %
Lymphocytes Relative: 38 %
Lymphs Abs: 4 10*3/uL (ref 0.7–4.0)
MCH: 33 pg (ref 26.0–34.0)
MCHC: 32.9 g/dL (ref 30.0–36.0)
MCV: 100.2 fL — ABNORMAL HIGH (ref 80.0–100.0)
Monocytes Absolute: 0.6 10*3/uL (ref 0.1–1.0)
Monocytes Relative: 6 %
Neutro Abs: 5.8 10*3/uL (ref 1.7–7.7)
Neutrophils Relative %: 55 %
Platelet Count: 199 10*3/uL (ref 150–400)
RBC: 4.03 MIL/uL (ref 3.87–5.11)
RDW: 14 % (ref 11.5–15.5)
WBC Count: 10.6 10*3/uL — ABNORMAL HIGH (ref 4.0–10.5)
nRBC: 0 % (ref 0.0–0.2)

## 2022-07-22 LAB — LACTATE DEHYDROGENASE: LDH: 147 U/L (ref 98–192)

## 2022-07-22 MED ORDER — DEXAMETHASONE 4 MG PO TABS: 20.0000 mg | ORAL_TABLET | Freq: Once | ORAL | Status: DC

## 2022-07-22 MED ORDER — DARATUMUMAB-HYALURONIDASE-FIHJ 1800-30000 MG-UT/15ML ~~LOC~~ SOLN
1800.0000 mg | Freq: Once | SUBCUTANEOUS | Status: AC
  Administered 2022-07-22: 1800 mg via SUBCUTANEOUS
  Filled 2022-07-22: qty 15

## 2022-07-22 MED ORDER — DIPHENHYDRAMINE HCL 25 MG PO CAPS: 50.0000 mg | ORAL_CAPSULE | Freq: Once | ORAL | Status: DC

## 2022-07-22 MED ORDER — ACETAMINOPHEN 325 MG PO TABS: 650.0000 mg | ORAL_TABLET | Freq: Once | ORAL | Status: DC

## 2022-07-22 MED ORDER — BORTEZOMIB CHEMO SQ INJECTION 3.5 MG (2.5MG/ML)
1.3000 mg/m2 | Freq: Once | INTRAMUSCULAR | Status: AC
  Administered 2022-07-22: 2.75 mg via SUBCUTANEOUS
  Filled 2022-07-22: qty 1.1

## 2022-07-22 NOTE — Patient Instructions (Signed)
[facility]  Discharge Instructions: Thank you for choosing [facility] to provide your oncology and hematology care.   If you have a lab appointment with the [facility], please go directly to the [facility] and check in at the registration area.  Wear comfortable clothing and clothing appropriate for easy access to any Portacath or PICC line.   We strive to give you quality time with your provider. You may need to reschedule your appointment if you arrive late (15 or more minutes).  Arriving late affects you and other patients whose appointments are after yours.  Also, if you miss three or more appointments without notifying the office, you may be dismissed from the clinic at the provider's discretion.      For prescription refill requests, have your pharmacy contact our office and allow 72 hours for refills to be completed.    Today you received the following chemotherapy and/or immunotherapy agents:  Faspro and Velcade      To help prevent nausea and vomiting after your treatment, we encourage you to take your nausea medication as directed.  BELOW ARE SYMPTOMS THAT SHOULD BE REPORTED IMMEDIATELY: *FEVER GREATER THAN 100.4 F (38 C) OR HIGHER *CHILLS OR SWEATING *NAUSEA AND VOMITING THAT IS NOT CONTROLLED WITH YOUR NAUSEA MEDICATION *UNUSUAL SHORTNESS OF BREATH *UNUSUAL BRUISING OR BLEEDING *URINARY PROBLEMS (pain or burning when urinating, or frequent urination) *BOWEL PROBLEMS (unusual diarrhea, constipation, pain near the anus) TENDERNESS IN MOUTH AND THROAT WITH OR WITHOUT PRESENCE OF ULCERS (sore throat, sores in mouth, or a toothache) UNUSUAL RASH, SWELLING OR PAIN  UNUSUAL VAGINAL DISCHARGE OR ITCHING   Items with * indicate a potential emergency and should be followed up as soon as possible or go to the Emergency Department if any problems should occur.  Please show the CHEMOTHERAPY ALERT CARD or IMMUNOTHERAPY ALERT CARD at  check-in to the Emergency Department and triage nurse. Should you have questions after your visit or need to cancel or reschedule your appointment, please contact [facility]  [phone] and follow the prompts.  Office hours are 8:00 a.m. to 4:30 p.m. Monday - Friday. Please note that voicemails left after 4:00 p.m. may not be returned until the following business day.  We are closed weekends and major holidays. You have access to a nurse at all times for urgent questions. Please call the main number to the clinic [phone] and follow the prompts.  For any non-urgent questions, you may also contact your provider using MyChart. We now offer e-Visits for anyone 33 and older to request care online for non-urgent symptoms. For details visit [email or web address].   Also download the MyChart app! Go to the app store, search "MyChart", open the app, select [facility], and log in with your MyChart username and password.  Masks are optional in the cancer centers. If you would like for your care team to wear a mask while they are taking care of you, please let them know. You may have one support person who is at least 73 years old accompany you for your appointments.

## 2022-07-22 NOTE — Addendum Note (Signed)
Addended by: Burney Gauze R on: 07/22/2022 10:50 AM   Modules accepted: Orders

## 2022-07-22 NOTE — Progress Notes (Signed)
Hematology and Oncology Follow Up Visit  Marcia [name] [id number] 10-15-1951 71 y.o. 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 She is doing well and excited to see her grandson later today.  Last month, no m-spike detected, IgG level was 393 mg/dL and kappa light chains 3.93 mg/dL.  No fever, chills, n/v, cough, rash, dizziness, SOB, chest pain, palpitations, abdominal pain or changes in bowel or bladder habits.  No bruising or petechiae.  No swelling in her extremities.  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  She has had tenderness in her left knee and has been exercising to help.  No falls or syncope to report.  Appetite and hydration are good. Her weight is stable at 229 lbs.   ECOG Performance Status: 1 - Symptomatic but completely ambulatory  Medications:  Allergies as of 07/22/2022       Reactions   Heparin Other (See Comments)   HIT   Sulfamethoxazole-trimethoprim Other (See Comments), Rash   ? Renal failure.   Zofran [ondansetron] Shortness Of Breath   "felt like throat was closing"   Clarithromycin Diarrhea, Nausea And Vomiting   Latex Rash   Macrodantin [nitrofurantoin] Rash        Medication List        Accurate as of July 22, 2022 10:20 AM. If you have any questions, ask your nurse or  doctor.          acetaminophen 325 MG tablet Commonly known as: TYLENOL Take 650 mg by mouth daily as needed for moderate pain or headache.   dexamethasone 6 MG tablet Commonly known as: DECADRON Take 2 tablets (12 mg total) by mouth daily. Take 30 minutes prior to treatment.   Eliquis 2.5 MG Tabs tablet Generic drug: apixaban TAKE 1 TABLET BY MOUTH TWICE A DAY   famciclovir 250 MG tablet Commonly known as: FAMVIR TAKE 1 TABLET BY MOUTH EVERY DAY   feeding supplement Liqd Take 237 mLs by mouth 3 (three) times daily between meals. What changed: when to take this   folic acid 1 MG tablet Commonly known as: FOLVITE TAKE 2 TABLETS (2 MG TOTAL) BY MOUTH DAILY.   HYDROcodone-acetaminophen 5-325 MG tablet Commonly known as: NORCO/VICODIN Take 1 tablet by mouth every 4 (four) hours as needed.   lidocaine-prilocaine cream Commonly known as: EMLA Apply 1 application topically as needed (local anesthetic).   LORazepam 0.5 MG tablet Commonly known as: ATIVAN Take 1 tablet by mouth daily as needed.   losartan 25 MG tablet Commonly known as: COZAAR Take 25 mg by mouth daily.   losartan 25 MG tablet Commonly known as: COZAAR Take 1 tablet by mouth daily.   montelukast 10 MG tablet Commonly known as: Singulair Take 1 tablet (10 mg total) by mouth at bedtime. Start the Singulair on 03/14/2022   mupirocin ointment 2 % Commonly known as:  BACTROBAN   NEOMYCIN-BACITRACIN ZN-POLYMYX OP LOCATION: BOTH EYES. APPLY TO THE LID MARGINS TWICE A DAY   Pomalyst 3 MG capsule Generic drug: pomalidomide   traMADol 50 MG tablet Commonly known as: ULTRAM Take 1 tablet by mouth every 12 (twelve) hours as needed.   VITAMIN E PO        Allergies:  Allergies  Allergen Reactions   Heparin Other (See Comments)    HIT   Sulfamethoxazole-Trimethoprim Other (See Comments) and Rash    ? Renal failure.   Zofran [Ondansetron] Shortness Of Breath    "felt like throat was closing"    Clarithromycin Diarrhea and Nausea And Vomiting   Latex Rash   Macrodantin [Nitrofurantoin] Rash    Past Medical History, Surgical history, Social history, and Family History were reviewed and updated.  Review of Systems: All other 10 point review of systems is negative.   Physical Exam:  height is 4' 10.5" (1.486 m) and weight is 229 lb 0.6 oz (103.9 kg). Her oral temperature is 97.9 F (36.6 C). Her blood pressure is 141/59 (abnormal) and her pulse is 98. Her respiration is 19 and oxygen saturation is 98%.   Wt Readings from Last 3 Encounters:  07/22/22 229 lb 0.6 oz (103.9 kg)  06/20/22 231 lb (104.8 kg)  05/13/22 230 lb (104.3 kg)    Ocular: Sclerae unicteric, pupils equal, round and reactive to light Ear-nose-throat: Oropharynx clear, dentition fair Lymphatic: No cervical or supraclavicular adenopathy Lungs no rales or rhonchi, good excursion bilaterally Heart regular rate and rhythm, no murmur appreciated Abd soft, nontender, positive bowel sounds MSK no focal spinal tenderness, no joint edema Neuro: non-focal, well-oriented, appropriate affect Breasts: Deferred   Lab Results  Component Value Date   WBC 10.6 (H) 07/22/2022   HGB 13.3 07/22/2022   HCT 40.4 07/22/2022   MCV 100.2 (H) 07/22/2022   PLT 199 07/22/2022   Lab Results  Component Value Date   FERRITIN 820 (H) 05/17/2018   IRON 272 (H) 05/17/2018   TIBC 295 05/17/2018   UIBC 23 05/17/2018   IRONPCTSAT 92 (H) 05/17/2018   Lab Results  Component Value Date   RETICCTPCT 1.3 03/27/2011   RBC 4.03 07/22/2022   RETICCTABS 56.4 03/27/2011   Lab Results  Component Value Date   KPAFRELGTCHN 39.3 (H) 06/20/2022   LAMBDASER 7.8 06/20/2022   KAPLAMBRATIO 5.04 (H) 06/20/2022   Lab Results  Component Value Date   IGGSERUM 393 (L) 06/20/2022   IGA 22 (L) 06/20/2022   IGMSERUM 24 (L) 06/20/2022   Lab Results  Component Value Date   TOTALPROTELP 5.6 (L) 06/20/2022   ALBUMINELP 3.3 06/20/2022   A1GS 0.2  06/20/2022   A2GS 0.8 06/20/2022   BETS 1.0 06/20/2022   BETA2SER 0.4 07/02/2015   GAMS 0.4 06/20/2022   MSPIKE Not Observed 06/20/2022   SPEI Comment 04/08/2022     Chemistry      Component Value Date/Time   NA 139 07/08/2022 1025   NA 141 07/06/2017 0816   NA 141 01/05/2017 0956   K 3.9 07/08/2022 1025   K 3.6 07/06/2017 0816   K 4.3 01/05/2017 0956   CL 103 07/08/2022 1025   CL 101 07/06/2017 0816   CO2 25 07/08/2022 1025   CO2 28 07/06/2017 0816   CO2 23 01/05/2017 0956   BUN 24 (H) 07/08/2022 1025   BUN 28 (H) 07/06/2017 0816   BUN 22.4 01/05/2017 0956   CREATININE 1.39 (H) 07/08/2022 1025   CREATININE 1.39 (  H) 07/01/2022 1053   CREATININE 1.6 (H) 07/06/2017 0816   CREATININE 1.4 (H) 01/05/2017 0956      Component Value Date/Time   CALCIUM 9.2 07/08/2022 1025   CALCIUM 9.1 07/06/2017 0816   CALCIUM 9.3 01/05/2017 0956   ALKPHOS 52 07/08/2022 1025   ALKPHOS 58 07/06/2017 0816   ALKPHOS 52 01/05/2017 0956   AST 12 (L) 07/08/2022 1025   AST 11 (L) 07/01/2022 1053   AST 14 01/05/2017 0956   ALT 11 07/08/2022 1025   ALT 9 07/01/2022 1053   ALT 58 (H) 07/06/2017 0816   ALT 13 01/05/2017 0956   BILITOT 0.8 07/08/2022 1025   BILITOT 0.7 07/01/2022 1053   BILITOT 0.56 01/05/2017 0956       Impression and Plan: Ms. Tillison is a very pleasant 71 yo caucasian female with kappa light chain myeloma that has recurred again.   We will proceed with treatment today as planned.  Follow-up in 4 weeks.   Lottie Dawson, NP 8/29/202310:20 AM

## 2022-07-23 ENCOUNTER — Other Ambulatory Visit: Payer: Self-pay

## 2022-07-23 LAB — KAPPA/LAMBDA LIGHT CHAINS
Kappa free light chain: 50.8 mg/L — ABNORMAL HIGH (ref 3.3–19.4)
Kappa, lambda light chain ratio: 4.42 — ABNORMAL HIGH (ref 0.26–1.65)
Lambda free light chains: 11.5 mg/L (ref 5.7–26.3)

## 2022-07-24 ENCOUNTER — Other Ambulatory Visit: Payer: Self-pay

## 2022-07-24 ENCOUNTER — Other Ambulatory Visit: Payer: Self-pay | Admitting: Hematology & Oncology

## 2022-07-24 LAB — IGG, IGA, IGM
IgA: 24 mg/dL — ABNORMAL LOW (ref 64–422)
IgG (Immunoglobin G), Serum: 390 mg/dL — ABNORMAL LOW (ref 586–1602)
IgM (Immunoglobulin M), Srm: 26 mg/dL (ref 26–217)

## 2022-07-25 ENCOUNTER — Other Ambulatory Visit: Payer: Self-pay | Admitting: Family

## 2022-07-25 DIAGNOSIS — C9000 Multiple myeloma not having achieved remission: Secondary | ICD-10-CM

## 2022-07-27 ENCOUNTER — Other Ambulatory Visit: Payer: Self-pay

## 2022-07-29 ENCOUNTER — Inpatient Hospital Stay: Payer: Medicare Other

## 2022-07-29 ENCOUNTER — Inpatient Hospital Stay: Payer: Medicare Other | Attending: Hematology & Oncology

## 2022-07-29 VITALS — BP 129/67 | HR 102 | Resp 18

## 2022-07-29 DIAGNOSIS — Z5112 Encounter for antineoplastic immunotherapy: Secondary | ICD-10-CM | POA: Diagnosis not present

## 2022-07-29 DIAGNOSIS — Z5111 Encounter for antineoplastic chemotherapy: Secondary | ICD-10-CM | POA: Insufficient documentation

## 2022-07-29 DIAGNOSIS — C9001 Multiple myeloma in remission: Secondary | ICD-10-CM

## 2022-07-29 DIAGNOSIS — Z853 Personal history of malignant neoplasm of breast: Secondary | ICD-10-CM | POA: Diagnosis not present

## 2022-07-29 DIAGNOSIS — C50911 Malignant neoplasm of unspecified site of right female breast: Secondary | ICD-10-CM

## 2022-07-29 DIAGNOSIS — C9000 Multiple myeloma not having achieved remission: Secondary | ICD-10-CM | POA: Insufficient documentation

## 2022-07-29 LAB — CBC WITH DIFFERENTIAL (CANCER CENTER ONLY)
Abs Immature Granulocytes: 0.04 10*3/uL (ref 0.00–0.07)
Basophils Absolute: 0.1 10*3/uL (ref 0.0–0.1)
Basophils Relative: 0 %
Eosinophils Absolute: 0.1 10*3/uL (ref 0.0–0.5)
Eosinophils Relative: 1 %
HCT: 41.6 % (ref 36.0–46.0)
Hemoglobin: 13.6 g/dL (ref 12.0–15.0)
Immature Granulocytes: 0 %
Lymphocytes Relative: 37 %
Lymphs Abs: 4.1 10*3/uL — ABNORMAL HIGH (ref 0.7–4.0)
MCH: 32.8 pg (ref 26.0–34.0)
MCHC: 32.7 g/dL (ref 30.0–36.0)
MCV: 100.2 fL — ABNORMAL HIGH (ref 80.0–100.0)
Monocytes Absolute: 0.7 10*3/uL (ref 0.1–1.0)
Monocytes Relative: 6 %
Neutro Abs: 6.2 10*3/uL (ref 1.7–7.7)
Neutrophils Relative %: 56 %
Platelet Count: 175 10*3/uL (ref 150–400)
RBC: 4.15 MIL/uL (ref 3.87–5.11)
RDW: 13.9 % (ref 11.5–15.5)
WBC Count: 11.1 10*3/uL — ABNORMAL HIGH (ref 4.0–10.5)
nRBC: 0 % (ref 0.0–0.2)

## 2022-07-29 LAB — CMP (CANCER CENTER ONLY)
ALT: 10 U/L (ref 0–44)
AST: 11 U/L — ABNORMAL LOW (ref 15–41)
Albumin: 4.1 g/dL (ref 3.5–5.0)
Alkaline Phosphatase: 52 U/L (ref 38–126)
Anion gap: 11 (ref 5–15)
BUN: 24 mg/dL — ABNORMAL HIGH (ref 8–23)
CO2: 27 mmol/L (ref 22–32)
Calcium: 9.5 mg/dL (ref 8.9–10.3)
Chloride: 100 mmol/L (ref 98–111)
Creatinine: 1.41 mg/dL — ABNORMAL HIGH (ref 0.44–1.00)
GFR, Estimated: 39 mL/min — ABNORMAL LOW (ref >60)
Glucose, Bld: 98 mg/dL (ref 70–99)
Potassium: 4 mmol/L (ref 3.5–5.1)
Sodium: 138 mmol/L (ref 135–145)
Total Bilirubin: 0.6 mg/dL (ref 0.3–1.2)
Total Protein: 6.2 g/dL — ABNORMAL LOW (ref 6.5–8.1)

## 2022-07-29 MED ORDER — BORTEZOMIB CHEMO SQ INJECTION 3.5 MG (2.5MG/ML)
1.3000 mg/m2 | Freq: Once | INTRAMUSCULAR | Status: AC
  Administered 2022-07-29: 2.75 mg via SUBCUTANEOUS
  Filled 2022-07-29: qty 1.1

## 2022-07-29 MED ORDER — DEXAMETHASONE 4 MG PO TABS: 20.0000 mg | ORAL_TABLET | Freq: Once | ORAL | Status: DC

## 2022-07-29 NOTE — Patient Instructions (Signed)
[facility]  Discharge Instructions: Thank you for choosing [facility] to provide your oncology and hematology care.   If you have a lab appointment with the Cancer Center, please go directly to the Cancer Center and check in at the registration area.  Wear comfortable clothing and clothing appropriate for easy access to any Portacath or PICC line.   We strive to give you quality time with your provider. You may need to reschedule your appointment if you arrive late (15 or more minutes).  Arriving late affects you and other patients whose appointments are after yours.  Also, if you miss three or more appointments without notifying the office, you may be dismissed from the clinic at the provider's discretion.      For prescription refill requests, have your pharmacy contact our office and allow 72 hours for refills to be completed.    Today you received the following chemotherapy and/or immunotherapy agents velcade    To help prevent nausea and vomiting after your treatment, we encourage you to take your nausea medication as directed.  BELOW ARE SYMPTOMS THAT SHOULD BE REPORTED IMMEDIATELY: *FEVER GREATER THAN 100.4 F (38 C) OR HIGHER *CHILLS OR SWEATING *NAUSEA AND VOMITING THAT IS NOT CONTROLLED WITH YOUR NAUSEA MEDICATION *UNUSUAL SHORTNESS OF BREATH *UNUSUAL BRUISING OR BLEEDING *URINARY PROBLEMS (pain or burning when urinating, or frequent urination) *BOWEL PROBLEMS (unusual diarrhea, constipation, pain near the anus) TENDERNESS IN MOUTH AND THROAT WITH OR WITHOUT PRESENCE OF ULCERS (sore throat, sores in mouth, or a toothache) UNUSUAL RASH, SWELLING OR PAIN  UNUSUAL VAGINAL DISCHARGE OR ITCHING   Items with * indicate a potential emergency and should be followed up as soon as possible or go to the Emergency Department if any problems should occur.  Please show the CHEMOTHERAPY ALERT CARD or IMMUNOTHERAPY ALERT CARD at check-in to the  Emergency Department and triage nurse. Should you have questions after your visit or need to cancel or reschedule your appointment, please contact [facility]  [phone] and follow the prompts.  Office hours are 8:00 a.m. to 4:30 p.m. Monday - Friday. Please note that voicemails left after 4:00 p.m. may not be returned until the following business day.  We are closed weekends and major holidays. You have access to a nurse at all times for urgent questions. Please call the main number to the clinic [phone] and follow the prompts.  For any non-urgent questions, you may also contact your provider using MyChart. We now offer e-Visits for anyone 18 and older to request care online for non-urgent symptoms. For details visit [email or web address].   Also download the MyChart app! Go to the app store, search "MyChart", open the app, select , and log in with your MyChart username and password.  Masks are optional in the cancer centers. If you would like for your care team to wear a mask while they are taking care of you, please let them know. You may have one support person who is at least 73 years old accompany you for your appointments. 

## 2022-07-29 NOTE — Patient Instructions (Signed)

## 2022-07-30 ENCOUNTER — Other Ambulatory Visit: Payer: Self-pay

## 2022-07-30 LAB — PROTEIN ELECTROPHORESIS, SERUM, WITH REFLEX
A/G Ratio: 1.4 (ref 0.7–1.7)
Albumin ELP: 3.5 g/dL (ref 2.9–4.4)
Alpha-1-Globulin: 0.2 g/dL (ref 0.0–0.4)
Alpha-2-Globulin: 0.8 g/dL (ref 0.4–1.0)
Beta Globulin: 1.2 g/dL (ref 0.7–1.3)
Gamma Globulin: 0.4 g/dL (ref 0.4–1.8)
Globulin, Total: 2.5 g/dL (ref 2.2–3.9)
M-Spike, %: 0.1 g/dL — ABNORMAL HIGH
SPEP Interpretation: 0
Total Protein ELP: 6 g/dL (ref 6.0–8.5)

## 2022-07-30 LAB — IMMUNOFIXATION REFLEX, SERUM
IgA: 22 mg/dL — ABNORMAL LOW (ref 64–422)
IgG (Immunoglobin G), Serum: 379 mg/dL — ABNORMAL LOW (ref 586–1602)
IgM (Immunoglobulin M), Srm: 27 mg/dL (ref 26–217)

## 2022-08-01 ENCOUNTER — Other Ambulatory Visit: Payer: Self-pay

## 2022-08-12 ENCOUNTER — Inpatient Hospital Stay: Payer: Medicare Other

## 2022-08-12 ENCOUNTER — Other Ambulatory Visit: Payer: Self-pay | Admitting: Hematology & Oncology

## 2022-08-12 DIAGNOSIS — C50911 Malignant neoplasm of unspecified site of right female breast: Secondary | ICD-10-CM

## 2022-08-12 DIAGNOSIS — C9001 Multiple myeloma in remission: Secondary | ICD-10-CM

## 2022-08-19 ENCOUNTER — Other Ambulatory Visit: Payer: Self-pay

## 2022-08-19 ENCOUNTER — Inpatient Hospital Stay: Payer: Medicare Other

## 2022-08-19 ENCOUNTER — Encounter: Payer: Self-pay | Admitting: Family

## 2022-08-19 ENCOUNTER — Inpatient Hospital Stay (HOSPITAL_BASED_OUTPATIENT_CLINIC_OR_DEPARTMENT_OTHER): Payer: Medicare Other | Admitting: Family

## 2022-08-19 VITALS — BP 142/78 | HR 96

## 2022-08-19 VITALS — BP 166/86 | HR 98 | Temp 98.4°F | Resp 18 | Wt 230.0 lb

## 2022-08-19 DIAGNOSIS — I824Z3 Acute embolism and thrombosis of unspecified deep veins of distal lower extremity, bilateral: Secondary | ICD-10-CM | POA: Diagnosis not present

## 2022-08-19 DIAGNOSIS — C50911 Malignant neoplasm of unspecified site of right female breast: Secondary | ICD-10-CM

## 2022-08-19 DIAGNOSIS — C9000 Multiple myeloma not having achieved remission: Secondary | ICD-10-CM

## 2022-08-19 DIAGNOSIS — I2601 Septic pulmonary embolism with acute cor pulmonale: Secondary | ICD-10-CM

## 2022-08-19 DIAGNOSIS — Z5111 Encounter for antineoplastic chemotherapy: Secondary | ICD-10-CM | POA: Diagnosis not present

## 2022-08-19 DIAGNOSIS — C9001 Multiple myeloma in remission: Secondary | ICD-10-CM | POA: Diagnosis not present

## 2022-08-19 LAB — CBC WITH DIFFERENTIAL (CANCER CENTER ONLY)
Abs Immature Granulocytes: 0.02 10*3/uL (ref 0.00–0.07)
Basophils Absolute: 0 10*3/uL (ref 0.0–0.1)
Basophils Relative: 0 %
Eosinophils Absolute: 0.2 10*3/uL (ref 0.0–0.5)
Eosinophils Relative: 2 %
HCT: 39.2 % (ref 36.0–46.0)
Hemoglobin: 12.8 g/dL (ref 12.0–15.0)
Immature Granulocytes: 0 %
Lymphocytes Relative: 48 %
Lymphs Abs: 4.4 10*3/uL — ABNORMAL HIGH (ref 0.7–4.0)
MCH: 32.4 pg (ref 26.0–34.0)
MCHC: 32.7 g/dL (ref 30.0–36.0)
MCV: 99.2 fL (ref 80.0–100.0)
Monocytes Absolute: 0.5 10*3/uL (ref 0.1–1.0)
Monocytes Relative: 5 %
Neutro Abs: 4.2 10*3/uL (ref 1.7–7.7)
Neutrophils Relative %: 45 %
Platelet Count: 203 10*3/uL (ref 150–400)
RBC: 3.95 MIL/uL (ref 3.87–5.11)
RDW: 13.5 % (ref 11.5–15.5)
WBC Count: 9.3 10*3/uL (ref 4.0–10.5)
nRBC: 0 % (ref 0.0–0.2)

## 2022-08-19 LAB — LACTATE DEHYDROGENASE: LDH: 161 U/L (ref 98–192)

## 2022-08-19 LAB — CMP (CANCER CENTER ONLY)
ALT: 13 U/L (ref 0–44)
AST: 13 U/L — ABNORMAL LOW (ref 15–41)
Albumin: 4.1 g/dL (ref 3.5–5.0)
Alkaline Phosphatase: 67 U/L (ref 38–126)
Anion gap: 10 (ref 5–15)
BUN: 20 mg/dL (ref 8–23)
CO2: 27 mmol/L (ref 22–32)
Calcium: 9.7 mg/dL (ref 8.9–10.3)
Chloride: 103 mmol/L (ref 98–111)
Creatinine: 1.31 mg/dL — ABNORMAL HIGH (ref 0.44–1.00)
GFR, Estimated: 43 mL/min — ABNORMAL LOW (ref >60)
Glucose, Bld: 107 mg/dL — ABNORMAL HIGH (ref 70–99)
Potassium: 3.7 mmol/L (ref 3.5–5.1)
Sodium: 140 mmol/L (ref 135–145)
Total Bilirubin: 0.6 mg/dL (ref 0.3–1.2)
Total Protein: 6.5 g/dL (ref 6.5–8.1)

## 2022-08-19 MED ORDER — SODIUM CHLORIDE 0.9 % IV SOLN: 16.0000 mg/kg | Freq: Once | INTRAVENOUS | Status: DC

## 2022-08-19 MED ORDER — DIPHENHYDRAMINE HCL 25 MG PO CAPS: 50.0000 mg | ORAL_CAPSULE | Freq: Once | ORAL | Status: DC

## 2022-08-19 MED ORDER — SODIUM CHLORIDE 0.9% FLUSH: 10.0000 mL | INTRAVENOUS | Status: DC | PRN

## 2022-08-19 MED ORDER — DARATUMUMAB-HYALURONIDASE-FIHJ 1800-30000 MG-UT/15ML ~~LOC~~ SOLN
1800.0000 mg | Freq: Once | SUBCUTANEOUS | Status: AC
  Administered 2022-08-19: 1800 mg via SUBCUTANEOUS
  Filled 2022-08-19: qty 15

## 2022-08-19 MED ORDER — ACETAMINOPHEN 325 MG PO TABS: 650.0000 mg | ORAL_TABLET | Freq: Once | ORAL | Status: DC

## 2022-08-19 MED ORDER — METHYLPREDNISOLONE SODIUM SUCC 125 MG IJ SOLR: 100.0000 mg | Freq: Once | INTRAMUSCULAR | Status: DC

## 2022-08-19 MED ORDER — BORTEZOMIB CHEMO SQ INJECTION 3.5 MG (2.5MG/ML)
1.3000 mg/m2 | Freq: Once | INTRAMUSCULAR | Status: AC
  Administered 2022-08-19: 2.75 mg via SUBCUTANEOUS
  Filled 2022-08-19: qty 1.1

## 2022-08-19 MED ORDER — SODIUM CHLORIDE 0.9 % IV SOLN: Freq: Once | INTRAVENOUS | Status: DC

## 2022-08-19 MED ORDER — DEXAMETHASONE 4 MG PO TABS: 20.0000 mg | ORAL_TABLET | Freq: Once | ORAL | Status: DC

## 2022-08-19 NOTE — Progress Notes (Signed)
Hematology and Oncology Follow Up Visit  Marcia [name] [id number] 1950-12-19 71 y.o. 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 is here today for follow-up and treatment. She is doing fairly well. She had a wonderful time visit with her grandson recently. She unfortunately, did have a fall and hit her head while they were there. Thankfully, she states that she as not seriously injured.  She is ambulating with a cane for added support.  She denies syncope.  M-spike last month was 0.1 g/dL, IgG level 390 mg/dL and kappa light chains 5.08 mg/dL.  She notes occasional SOB with over exertion.  No fever, chills, n/v, cough, rash, dizziness, chest pain, palpitations, abdominal pain or changes in bowel or bladder habits.  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  No swelling noted in her extremities.  Appetite and hydration are good. Weight is stable at 230 lbs.   ECOG Performance Status: 1 - Symptomatic but completely ambulatory  Medications:  Allergies as of 08/19/2022       Reactions   Heparin Other (See Comments)   HIT   Sulfamethoxazole-trimethoprim Other (See Comments), Rash   ? Renal failure.   Zofran [ondansetron] Shortness Of Breath   "felt like throat was closing"   Clarithromycin Diarrhea, Nausea And Vomiting   Latex Rash    Macrodantin [nitrofurantoin] Rash        Medication List        Accurate as of August 19, 2022 10:48 AM. If you have any questions, ask your nurse or doctor.          acetaminophen 325 MG tablet Commonly known as: TYLENOL Take 650 mg by mouth daily as needed for moderate pain or headache.   dexamethasone 6 MG tablet Commonly known as: DECADRON Take 2 tablets (12 mg total) by mouth daily. Take 30 minutes prior to treatment.   Eliquis 2.5 MG Tabs tablet Generic drug: apixaban TAKE 1 TABLET BY MOUTH TWICE A DAY   famciclovir 250 MG tablet Commonly known as: FAMVIR TAKE 1 TABLET BY MOUTH EVERY DAY   feeding supplement Liqd Take 237 mLs by mouth 3 (three) times daily between meals. What changed: when to take this   folic acid 1 MG tablet Commonly known as: FOLVITE TAKE 2 TABLETS (2 MG TOTAL) BY MOUTH DAILY.   HYDROcodone-acetaminophen 5-325 MG tablet Commonly known as: NORCO/VICODIN Take 1 tablet by mouth every 4 (four) hours as needed.   lidocaine-prilocaine cream Commonly known as: EMLA Apply 1 application topically as needed (local anesthetic).   LORazepam 0.5 MG tablet Commonly known as: ATIVAN Take 1 tablet by mouth daily as needed.   losartan 25 MG tablet Commonly known as: COZAAR Take 25 mg by mouth daily.   losartan 25 MG tablet Commonly known as: COZAAR Take 1 tablet by mouth daily.   montelukast 10 MG tablet Commonly known as: Singulair Take 1  tablet (10 mg total) by mouth at bedtime. Start the Singulair on 03/14/2022   mupirocin ointment 2 % Commonly known as: BACTROBAN   NEOMYCIN-BACITRACIN ZN-POLYMYX OP LOCATION: BOTH EYES. APPLY TO THE LID MARGINS TWICE A DAY   Pomalyst 3 MG capsule Generic drug: pomalidomide   traMADol 50 MG tablet Commonly known as: ULTRAM Take 1 tablet by mouth every 12 (twelve) hours as needed.   VITAMIN E PO        Allergies:  Allergies  Allergen Reactions   Heparin Other (See Comments)    HIT    Sulfamethoxazole-Trimethoprim Other (See Comments) and Rash    ? Renal failure.   Zofran [Ondansetron] Shortness Of Breath    "felt like throat was closing"   Clarithromycin Diarrhea and Nausea And Vomiting   Latex Rash   Macrodantin [Nitrofurantoin] Rash    Past Medical History, Surgical history, Social history, and Family History were reviewed and updated.  Review of Systems: All other 10 point review of systems is negative.   Physical Exam:  weight is 230 lb (104.3 kg). Her oral temperature is 98.4 F (36.9 C). Her blood pressure is 166/86 (abnormal) and her pulse is 98. Her respiration is 18 and oxygen saturation is 98%.   Wt Readings from Last 3 Encounters:  08/19/22 230 lb (104.3 kg)  07/22/22 229 lb 0.6 oz (103.9 kg)  06/20/22 231 lb (104.8 kg)    Ocular: Sclerae unicteric, pupils equal, round and reactive to light Ear-nose-throat: Oropharynx clear, dentition fair Lymphatic: No cervical or supraclavicular adenopathy Lungs no rales or rhonchi, good excursion bilaterally Heart regular rate and rhythm, no murmur appreciated Abd soft, nontender, positive bowel sounds MSK no focal spinal tenderness, no joint edema Neuro: non-focal, well-oriented, appropriate affect Breasts: Deferred   Lab Results  Component Value Date   WBC 9.3 08/19/2022   HGB 12.8 08/19/2022   HCT 39.2 08/19/2022   MCV 99.2 08/19/2022   PLT 203 08/19/2022   Lab Results  Component Value Date   FERRITIN 820 (H) 05/17/2018   IRON 272 (H) 05/17/2018   TIBC 295 05/17/2018   UIBC 23 05/17/2018   IRONPCTSAT 92 (H) 05/17/2018   Lab Results  Component Value Date   RETICCTPCT 1.3 03/27/2011   RBC 3.95 08/19/2022   RETICCTABS 56.4 03/27/2011   Lab Results  Component Value Date   KPAFRELGTCHN 50.8 (H) 07/22/2022   LAMBDASER 11.5 07/22/2022   KAPLAMBRATIO 4.42 (H) 07/22/2022   Lab Results  Component Value Date   IGGSERUM 390 (L) 07/22/2022   IGGSERUM 379 (L) 07/22/2022   IGA 24 (L) 07/22/2022    IGA 22 (L) 07/22/2022   IGMSERUM 26 07/22/2022   IGMSERUM 27 07/22/2022   Lab Results  Component Value Date   TOTALPROTELP 6.0 07/22/2022   ALBUMINELP 3.5 07/22/2022   A1GS 0.2 07/22/2022   A2GS 0.8 07/22/2022   BETS 1.2 07/22/2022   BETA2SER 0.4 07/02/2015   GAMS 0.4 07/22/2022   MSPIKE 0.1 (H) 07/22/2022   SPEI Comment 04/08/2022     Chemistry      Component Value Date/Time   NA 140 08/19/2022 0955   NA 141 07/06/2017 0816   NA 141 01/05/2017 0956   K 3.7 08/19/2022 0955   K 3.6 07/06/2017 0816   K 4.3 01/05/2017 0956   CL 103 08/19/2022 0955   CL 101 07/06/2017 0816   CO2 27 08/19/2022 0955   CO2 28 07/06/2017 0816   CO2 23 01/05/2017 0956   BUN 20 08/19/2022  0955   BUN 28 (H) 07/06/2017 0816   BUN 22.4 01/05/2017 0956   CREATININE 1.31 (H) 08/19/2022 0955   CREATININE 1.6 (H) 07/06/2017 0816   CREATININE 1.4 (H) 01/05/2017 0956      Component Value Date/Time   CALCIUM 9.7 08/19/2022 0955   CALCIUM 9.1 07/06/2017 0816   CALCIUM 9.3 01/05/2017 0956   ALKPHOS 67 08/19/2022 0955   ALKPHOS 58 07/06/2017 0816   ALKPHOS 52 01/05/2017 0956   AST 13 (L) 08/19/2022 0955   AST 14 01/05/2017 0956   ALT 13 08/19/2022 0955   ALT 58 (H) 07/06/2017 0816   ALT 13 01/05/2017 0956   BILITOT 0.6 08/19/2022 0955   BILITOT 0.56 01/05/2017 0956       Impression and Plan: Ms. Shiller is a very pleasant 71 yo caucasian female with kappa light chain myeloma that has recurred again.   Protein studies are pending.  We will proceed with treatment today as planned.  Follow-up in 4 weeks.   Lottie Dawson, NP 9/26/202310:48 AM

## 2022-08-19 NOTE — Patient Instructions (Signed)
Daratumumab Injection What is this medication? DARATUMUMAB (dar a toom ue mab) treats multiple myeloma, a type of bone marrow cancer. It works by helping your immune system slow or stop the spread of cancer cells. It is a monoclonal antibody. This medicine may be used for other purposes; ask your health care provider or pharmacist if you have questions. COMMON BRAND NAME(S): DARZALEX What should I tell my care team before I take this medication? They need to know if you have any of these conditions: Hereditary fructose intolerance Infection, such as chickenpox, herpes, hepatitis B virus Lung or breathing disease, such as asthma, COPD An unusual or allergic reaction to daratumumab, sorbitol, other medications, foods, dyes, or preservatives Pregnant or trying to get pregnant Breast-feeding How should I use this medication? This medication is injected into a vein. It is given by your care team in a hospital or clinic setting. Talk to your care team about the use of this medication in children. Special care may be needed. Overdosage: If you think you have taken too much of this medicine contact a poison control center or emergency room at once. NOTE: This medicine is only for you. Do not share this medicine with others. What if I miss a dose? Keep appointments for follow-up doses. It is important not to miss your dose. Call your care team if you are unable to keep an appointment. What may interact with this medication? Interactions have not been studied. This list may not describe all possible interactions. Give your health care provider a list of all the medicines, herbs, non-prescription drugs, or dietary supplements you use. Also tell them if you smoke, drink alcohol, or use illegal drugs. Some items may interact with your medicine. What should I watch for while using this medication? Your condition will be monitored carefully while you are receiving this medication. This medication can cause  serious allergic reactions. To reduce your risk, your care team may give you other medication to take before receiving this one. Be sure to follow the directions from your care team. This medication can affect the results of blood tests to match your blood type. These changes can last for up to 6 months after the final dose. Your care team will do blood tests to match your blood type before you start treatment. Tell all of your care team that you are being treated with this medication before receiving a blood transfusion. This medication can affect the results of some tests used to determine treatment response; extra tests may be needed to evaluate response. Talk to your care team if you wish to become pregnant or think you are pregnant. This medication can cause serious birth defects if taken during pregnancy and for 3 months after the last dose. A reliable form of contraception is recommended while taking this medication and for 3 months after the last dose. Talk to your care team about effective forms of contraception. Do not breast-feed while taking this medication. What side effects may I notice from receiving this medication? Side effects that you should report to your care team as soon as possible: Allergic reactions--skin rash, itching, hives, swelling of the face, lips, tongue, or throat Infection--fever, chills, cough, sore throat, wounds that don't heal, pain or trouble when passing urine, general feeling of discomfort or being unwell Infusion reactions--chest pain, shortness of breath or trouble breathing, feeling faint or lightheaded Unusual bruising or bleeding Side effects that usually do not require medical attention (report to your care team if they continue  or are bothersome): Constipation Diarrhea Fatigue Nausea Pain, tingling, or numbness in the hands or feet Swelling of the ankles, hands, or feet This list may not describe all possible side effects. Call your doctor for  medical advice about side effects. You may report side effects to FDA at 1-800-FDA-1088. Where should I keep my medication? This medication is given in a hospital or clinic. It will not be stored at home. NOTE: This sheet is a summary. It may not cover all possible information. If you have questions about this medicine, talk to your doctor, pharmacist, or health care provider.  2023 Elsevier/Gold Standard (2014-10-09 00:00:00)   Bortezomib Injection What is this medication? BORTEZOMIB (bor TEZ oh mib) treats lymphoma. It may also be used to treat multiple myeloma, a type of bone marrow cancer. It works by blocking a protein that causes cancer cells to grow and multiply. This helps to slow or stop the spread of cancer cells. This medicine may be used for other purposes; ask your health care provider or pharmacist if you have questions. COMMON BRAND NAME(S): Velcade What should I tell my care team before I take this medication? They need to know if you have any of these conditions: Dehydration Diabetes Heart disease Liver disease Tingling of the fingers or toes or other nerve disorder An unusual or allergic reaction to bortezomib, other medications, foods, dyes, or preservatives If you or your partner are pregnant or trying to get pregnant Breastfeeding How should I use this medication? This medication is injected into a vein or under the skin. It is given by your care team in a hospital or clinic setting. Talk to your care team about the use of this medication in children. Special care may be needed. Overdosage: If you think you have taken too much of this medicine contact a poison control center or emergency room at once. NOTE: This medicine is only for you. Do not share this medicine with others. What if I miss a dose? Keep appointments for follow-up doses. It is important not to miss your dose. Call your care team if you are unable to keep an appointment. What may interact with this  medication? Ketoconazole Rifampin This list may not describe all possible interactions. Give your health care provider a list of all the medicines, herbs, non-prescription drugs, or dietary supplements you use. Also tell them if you smoke, drink alcohol, or use illegal drugs. Some items may interact with your medicine. What should I watch for while using this medication? Your condition will be monitored carefully while you are receiving this medication. You may need blood work while taking this medication. This medication may affect your coordination, reaction time, or judgment. Do not drive or operate machinery until you know how this medication affects you. Sit up or stand slowly to reduce the risk of dizzy or fainting spells. Drinking alcohol with this medication can increase the risk of these side effects. This medication may increase your risk of getting an infection. Call your care team for advice if you get a fever, chills, sore throat, or other symptoms of a cold or flu. Do not treat yourself. Try to avoid being around people who are sick. Check with your care team if you have severe diarrhea, nausea, and vomiting, or if you sweat a lot. The loss of too much body fluid may make it dangerous for you to take this medication. Talk to your care team if you may be pregnant. Serious birth defects can occur if you   take this medication during pregnancy and for 7 months after the last dose. You will need a negative pregnancy test before starting this medication. Contraception is recommended while taking this medication and for 7 months after the last dose. Your care team can help you find the option that works for you. If your partner can get pregnant, use a condom during sex while taking this medication and for 4 months after the last dose. Do not breastfeed while taking this medication and for 2 months after the last dose. This medication may cause infertility. Talk to your care team if you are  concerned about your fertility. What side effects may I notice from receiving this medication? Side effects that you should report to your care team as soon as possible: Allergic reactions--skin rash, itching, hives, swelling of the face, lips, tongue, or throat Bleeding--bloody or black, tar-like stools, vomiting blood or brown material that looks like coffee grounds, red or dark brown urine, small red or purple spots on skin, unusual bruising or bleeding Bleeding in the brain--severe headache, stiff neck, confusion, dizziness, change in vision, numbness or weakness of the face, arm, or leg, trouble speaking, trouble walking, vomiting Bowel blockage--stomach cramping, unable to have a bowel movement or pass gas, loss of appetite, vomiting Heart failure--shortness of breath, swelling of the ankles, feet, or hands, sudden weight gain, unusual weakness or fatigue Infection--fever, chills, cough, sore throat, wounds that don't heal, pain or trouble when passing urine, general feeling of discomfort or being unwell Liver injury--right upper belly pain, loss of appetite, nausea, light-colored stool, dark yellow or brown urine, yellowing skin or eyes, unusual weakness or fatigue Low blood pressure--dizziness, feeling faint or lightheaded, blurry vision Lung injury--shortness of breath or trouble breathing, cough, spitting up blood, chest pain, fever Pain, tingling, or numbness in the hands or feet Severe or prolonged diarrhea Stomach pain, bloody diarrhea, pale skin, unusual weakness or fatigue, decrease in the amount of urine, which may be signs of hemolytic uremic syndrome Sudden and severe headache, confusion, change in vision, seizures, which may be signs of posterior reversible encephalopathy syndrome (PRES) TTP--purple spots on the skin or inside the mouth, pale skin, yellowing skin or eyes, unusual weakness or fatigue, fever, fast or irregular heartbeat, confusion, change in vision, trouble speaking,  trouble walking Tumor lysis syndrome (TLS)--nausea, vomiting, diarrhea, decrease in the amount of urine, dark urine, unusual weakness or fatigue, confusion, muscle pain or cramps, fast or irregular heartbeat, joint pain Side effects that usually do not require medical attention (report to your care team if they continue or are bothersome): Constipation Diarrhea Fatigue Loss of appetite Nausea This list may not describe all possible side effects. Call your doctor for medical advice about side effects. You may report side effects to FDA at 1-800-FDA-1088. Where should I keep my medication? This medication is given in a hospital or clinic. It will not be stored at home. NOTE: This sheet is a summary. It may not cover all possible information. If you have questions about this medicine, talk to your doctor, pharmacist, or health care provider.  2023 Elsevier/Gold Standard (2022-04-09 00:00:00)  

## 2022-08-20 LAB — KAPPA/LAMBDA LIGHT CHAINS
Kappa free light chain: 77.4 mg/L — ABNORMAL HIGH (ref 3.3–19.4)
Kappa, lambda light chain ratio: 9.11 — ABNORMAL HIGH (ref 0.26–1.65)
Lambda free light chains: 8.5 mg/L (ref 5.7–26.3)

## 2022-08-21 LAB — PROTEIN ELECTROPHORESIS, SERUM
A/G Ratio: 1.2 (ref 0.7–1.7)
Albumin ELP: 3.2 g/dL (ref 2.9–4.4)
Alpha-1-Globulin: 0.2 g/dL (ref 0.0–0.4)
Alpha-2-Globulin: 0.9 g/dL (ref 0.4–1.0)
Beta Globulin: 1.2 g/dL (ref 0.7–1.3)
Gamma Globulin: 0.3 g/dL — ABNORMAL LOW (ref 0.4–1.8)
Globulin, Total: 2.6 g/dL (ref 2.2–3.9)
Total Protein ELP: 5.8 g/dL — ABNORMAL LOW (ref 6.0–8.5)

## 2022-08-21 LAB — IGG, IGA, IGM
IgA: 28 mg/dL — ABNORMAL LOW (ref 64–422)
IgG (Immunoglobin G), Serum: 392 mg/dL — ABNORMAL LOW (ref 586–1602)
IgM (Immunoglobulin M), Srm: 27 mg/dL (ref 26–217)

## 2022-08-22 ENCOUNTER — Other Ambulatory Visit: Payer: Self-pay

## 2022-08-24 ENCOUNTER — Other Ambulatory Visit: Payer: Self-pay

## 2022-08-26 ENCOUNTER — Inpatient Hospital Stay: Payer: Medicare Other | Attending: Hematology & Oncology

## 2022-08-26 ENCOUNTER — Inpatient Hospital Stay: Payer: Medicare Other

## 2022-08-26 ENCOUNTER — Ambulatory Visit: Payer: Medicare Other | Admitting: Hematology & Oncology

## 2022-08-26 ENCOUNTER — Other Ambulatory Visit: Payer: Self-pay

## 2022-08-26 VITALS — BP 131/75 | HR 82 | Temp 99.5°F | Resp 20

## 2022-08-26 DIAGNOSIS — Z853 Personal history of malignant neoplasm of breast: Secondary | ICD-10-CM | POA: Insufficient documentation

## 2022-08-26 DIAGNOSIS — Z5112 Encounter for antineoplastic immunotherapy: Secondary | ICD-10-CM | POA: Diagnosis not present

## 2022-08-26 DIAGNOSIS — C9001 Multiple myeloma in remission: Secondary | ICD-10-CM

## 2022-08-26 DIAGNOSIS — C50911 Malignant neoplasm of unspecified site of right female breast: Secondary | ICD-10-CM

## 2022-08-26 DIAGNOSIS — Z5111 Encounter for antineoplastic chemotherapy: Secondary | ICD-10-CM | POA: Diagnosis not present

## 2022-08-26 DIAGNOSIS — C9000 Multiple myeloma not having achieved remission: Secondary | ICD-10-CM | POA: Diagnosis not present

## 2022-08-26 DIAGNOSIS — Z23 Encounter for immunization: Secondary | ICD-10-CM | POA: Insufficient documentation

## 2022-08-26 LAB — CMP (CANCER CENTER ONLY)
ALT: 22 U/L (ref 0–44)
AST: 19 U/L (ref 15–41)
Albumin: 3.7 g/dL (ref 3.5–5.0)
Alkaline Phosphatase: 65 U/L (ref 38–126)
Anion gap: 11 (ref 5–15)
BUN: 23 mg/dL (ref 8–23)
CO2: 25 mmol/L (ref 22–32)
Calcium: 8.9 mg/dL (ref 8.9–10.3)
Chloride: 104 mmol/L (ref 98–111)
Creatinine: 1.4 mg/dL — ABNORMAL HIGH (ref 0.44–1.00)
GFR, Estimated: 40 mL/min — ABNORMAL LOW (ref >60)
Glucose, Bld: 101 mg/dL — ABNORMAL HIGH (ref 70–99)
Potassium: 3.9 mmol/L (ref 3.5–5.1)
Sodium: 140 mmol/L (ref 135–145)
Total Bilirubin: 0.7 mg/dL (ref 0.3–1.2)
Total Protein: 6.8 g/dL (ref 6.5–8.1)

## 2022-08-26 LAB — CBC WITH DIFFERENTIAL (CANCER CENTER ONLY)
Abs Immature Granulocytes: 0.04 10*3/uL (ref 0.00–0.07)
Basophils Absolute: 0.1 10*3/uL (ref 0.0–0.1)
Basophils Relative: 1 %
Eosinophils Absolute: 0.2 10*3/uL (ref 0.0–0.5)
Eosinophils Relative: 2 %
HCT: 41.9 % (ref 36.0–46.0)
Hemoglobin: 13.7 g/dL (ref 12.0–15.0)
Immature Granulocytes: 0 %
Lymphocytes Relative: 44 %
Lymphs Abs: 4.7 10*3/uL — ABNORMAL HIGH (ref 0.7–4.0)
MCH: 32.6 pg (ref 26.0–34.0)
MCHC: 32.7 g/dL (ref 30.0–36.0)
MCV: 99.8 fL (ref 80.0–100.0)
Monocytes Absolute: 0.6 10*3/uL (ref 0.1–1.0)
Monocytes Relative: 6 %
Neutro Abs: 5 10*3/uL (ref 1.7–7.7)
Neutrophils Relative %: 47 %
Platelet Count: 189 10*3/uL (ref 150–400)
RBC: 4.2 MIL/uL (ref 3.87–5.11)
RDW: 13.7 % (ref 11.5–15.5)
WBC Count: 10.6 10*3/uL — ABNORMAL HIGH (ref 4.0–10.5)
nRBC: 0 % (ref 0.0–0.2)

## 2022-08-26 MED ORDER — SODIUM CHLORIDE 0.9% FLUSH
10.0000 mL | INTRAVENOUS | Status: DC | PRN
  Administered 2022-08-26: 10 mL

## 2022-08-26 MED ORDER — BORTEZOMIB CHEMO SQ INJECTION 3.5 MG (2.5MG/ML)
1.3000 mg/m2 | Freq: Once | INTRAMUSCULAR | Status: AC
  Administered 2022-08-26: 2.75 mg via SUBCUTANEOUS
  Filled 2022-08-26: qty 1.1

## 2022-08-26 NOTE — Progress Notes (Signed)
Pt takes dexamethasone at home prior to tx. dph

## 2022-08-26 NOTE — Patient Instructions (Signed)
[facility]  Discharge Instructions: Thank you for choosing [facility] to provide your oncology and hematology care.   If you have a lab appointment with the Cancer Center, please go directly to the Cancer Center and check in at the registration area.  Wear comfortable clothing and clothing appropriate for easy access to any Portacath or PICC line.   We strive to give you quality time with your provider. You may need to reschedule your appointment if you arrive late (15 or more minutes).  Arriving late affects you and other patients whose appointments are after yours.  Also, if you miss three or more appointments without notifying the office, you may be dismissed from the clinic at the provider's discretion.      For prescription refill requests, have your pharmacy contact our office and allow 72 hours for refills to be completed.    Today you received the following chemotherapy and/or immunotherapy agents Velcade      To help prevent nausea and vomiting after your treatment, we encourage you to take your nausea medication as directed.  BELOW ARE SYMPTOMS THAT SHOULD BE REPORTED IMMEDIATELY: *FEVER GREATER THAN 100.4 F (38 C) OR HIGHER *CHILLS OR SWEATING *NAUSEA AND VOMITING THAT IS NOT CONTROLLED WITH YOUR NAUSEA MEDICATION *UNUSUAL SHORTNESS OF BREATH *UNUSUAL BRUISING OR BLEEDING *URINARY PROBLEMS (pain or burning when urinating, or frequent urination) *BOWEL PROBLEMS (unusual diarrhea, constipation, pain near the anus) TENDERNESS IN MOUTH AND THROAT WITH OR WITHOUT PRESENCE OF ULCERS (sore throat, sores in mouth, or a toothache) UNUSUAL RASH, SWELLING OR PAIN  UNUSUAL VAGINAL DISCHARGE OR ITCHING   Items with * indicate a potential emergency and should be followed up as soon as possible or go to the Emergency Department if any problems should occur.  Please show the CHEMOTHERAPY ALERT CARD or IMMUNOTHERAPY ALERT CARD at check-in to the  Emergency Department and triage nurse. Should you have questions after your visit or need to cancel or reschedule your appointment, please contact [facility]  [phone] and follow the prompts.  Office hours are 8:00 a.m. to 4:30 p.m. Monday - Friday. Please note that voicemails left after 4:00 p.m. may not be returned until the following business day.  We are closed weekends and major holidays. You have access to a nurse at all times for urgent questions. Please call the main number to the clinic [phone] and follow the prompts.  For any non-urgent questions, you may also contact your provider using MyChart. We now offer e-Visits for anyone 18 and older to request care online for non-urgent symptoms. For details visit [email or web address].   Also download the MyChart app! Go to the app store, search "MyChart", open the app, select [facility], and log in with your MyChart username and password.  Masks are optional in the cancer centers. If you would like for your care team to wear a mask while they are taking care of you, please let them know. You may have one support person who is at least 73 years old accompany you for your appointments. 

## 2022-08-26 NOTE — Patient Instructions (Signed)

## 2022-08-27 ENCOUNTER — Other Ambulatory Visit: Payer: Self-pay

## 2022-09-10 ENCOUNTER — Other Ambulatory Visit: Payer: Self-pay

## 2022-09-12 ENCOUNTER — Other Ambulatory Visit: Payer: Self-pay | Admitting: Hematology & Oncology

## 2022-09-12 DIAGNOSIS — C9001 Multiple myeloma in remission: Secondary | ICD-10-CM

## 2022-09-12 DIAGNOSIS — C50911 Malignant neoplasm of unspecified site of right female breast: Secondary | ICD-10-CM

## 2022-09-16 ENCOUNTER — Inpatient Hospital Stay: Payer: Medicare Other

## 2022-09-16 ENCOUNTER — Inpatient Hospital Stay (HOSPITAL_BASED_OUTPATIENT_CLINIC_OR_DEPARTMENT_OTHER): Payer: Medicare Other | Admitting: Family

## 2022-09-16 ENCOUNTER — Encounter: Payer: Self-pay | Admitting: Family

## 2022-09-16 DIAGNOSIS — I824Z3 Acute embolism and thrombosis of unspecified deep veins of distal lower extremity, bilateral: Secondary | ICD-10-CM

## 2022-09-16 DIAGNOSIS — C9001 Multiple myeloma in remission: Secondary | ICD-10-CM

## 2022-09-16 DIAGNOSIS — I2601 Septic pulmonary embolism with acute cor pulmonale: Secondary | ICD-10-CM

## 2022-09-16 DIAGNOSIS — Z5111 Encounter for antineoplastic chemotherapy: Secondary | ICD-10-CM | POA: Diagnosis not present

## 2022-09-16 DIAGNOSIS — C50911 Malignant neoplasm of unspecified site of right female breast: Secondary | ICD-10-CM

## 2022-09-16 DIAGNOSIS — C9000 Multiple myeloma not having achieved remission: Secondary | ICD-10-CM

## 2022-09-16 LAB — CMP (CANCER CENTER ONLY)
ALT: 11 U/L (ref 0–44)
AST: 14 U/L — ABNORMAL LOW (ref 15–41)
Albumin: 4.1 g/dL (ref 3.5–5.0)
Alkaline Phosphatase: 54 U/L (ref 38–126)
Anion gap: 11 (ref 5–15)
BUN: 22 mg/dL (ref 8–23)
CO2: 25 mmol/L (ref 22–32)
Calcium: 9.7 mg/dL (ref 8.9–10.3)
Chloride: 105 mmol/L (ref 98–111)
Creatinine: 1.49 mg/dL — ABNORMAL HIGH (ref 0.44–1.00)
GFR, Estimated: 37 mL/min — ABNORMAL LOW (ref >60)
Glucose, Bld: 106 mg/dL — ABNORMAL HIGH (ref 70–99)
Potassium: 4.2 mmol/L (ref 3.5–5.1)
Sodium: 141 mmol/L (ref 135–145)
Total Bilirubin: 0.6 mg/dL (ref 0.3–1.2)
Total Protein: 6.1 g/dL — ABNORMAL LOW (ref 6.5–8.1)

## 2022-09-16 LAB — CBC WITH DIFFERENTIAL (CANCER CENTER ONLY)
Abs Immature Granulocytes: 0.03 10*3/uL (ref 0.00–0.07)
Basophils Absolute: 0 10*3/uL (ref 0.0–0.1)
Basophils Relative: 0 %
Eosinophils Absolute: 0.1 10*3/uL (ref 0.0–0.5)
Eosinophils Relative: 1 %
HCT: 41.4 % (ref 36.0–46.0)
Hemoglobin: 13.6 g/dL (ref 12.0–15.0)
Immature Granulocytes: 0 %
Lymphocytes Relative: 39 %
Lymphs Abs: 3.8 10*3/uL (ref 0.7–4.0)
MCH: 32.7 pg (ref 26.0–34.0)
MCHC: 32.9 g/dL (ref 30.0–36.0)
MCV: 99.5 fL (ref 80.0–100.0)
Monocytes Absolute: 0.5 10*3/uL (ref 0.1–1.0)
Monocytes Relative: 5 %
Neutro Abs: 5.3 10*3/uL (ref 1.7–7.7)
Neutrophils Relative %: 55 %
Platelet Count: 193 10*3/uL (ref 150–400)
RBC: 4.16 MIL/uL (ref 3.87–5.11)
RDW: 14 % (ref 11.5–15.5)
WBC Count: 9.7 10*3/uL (ref 4.0–10.5)
nRBC: 0 % (ref 0.0–0.2)

## 2022-09-16 LAB — LACTATE DEHYDROGENASE: LDH: 144 U/L (ref 98–192)

## 2022-09-16 MED ORDER — INFLUENZA VAC SPLIT QUAD 0.5 ML IM SUSY
0.5000 mL | PREFILLED_SYRINGE | Freq: Once | INTRAMUSCULAR | Status: AC
  Administered 2022-09-16: 0.5 mL via INTRAMUSCULAR
  Filled 2022-09-16: qty 0.5

## 2022-09-16 MED ORDER — DIPHENHYDRAMINE HCL 25 MG PO CAPS: 50.0000 mg | ORAL_CAPSULE | Freq: Once | ORAL | Status: DC

## 2022-09-16 MED ORDER — DARATUMUMAB-HYALURONIDASE-FIHJ 1800-30000 MG-UT/15ML ~~LOC~~ SOLN
1800.0000 mg | Freq: Once | SUBCUTANEOUS | Status: AC
  Administered 2022-09-16: 1800 mg via SUBCUTANEOUS
  Filled 2022-09-16: qty 15

## 2022-09-16 MED ORDER — DEXAMETHASONE 4 MG PO TABS: 20.0000 mg | ORAL_TABLET | Freq: Once | ORAL | Status: DC

## 2022-09-16 MED ORDER — ACETAMINOPHEN 325 MG PO TABS: 650.0000 mg | ORAL_TABLET | Freq: Once | ORAL | Status: DC

## 2022-09-16 NOTE — Patient Instructions (Signed)

## 2022-09-16 NOTE — Patient Instructions (Signed)
[facility]  Discharge Instructions: Thank you for choosing [facility] to provide your oncology and hematology care.   If you have a lab appointment with the Cancer Center, please go directly to the Cancer Center and check in at the registration area.  Wear comfortable clothing and clothing appropriate for easy access to any Portacath or PICC line.   We strive to give you quality time with your provider. You may need to reschedule your appointment if you arrive late (15 or more minutes).  Arriving late affects you and other patients whose appointments are after yours.  Also, if you miss three or more appointments without notifying the office, you may be dismissed from the clinic at the provider's discretion.      For prescription refill requests, have your pharmacy contact our office and allow 72 hours for refills to be completed.    Today you received the following chemotherapy and/or immunotherapy agents:  Faspro      To help prevent nausea and vomiting after your treatment, we encourage you to take your nausea medication as directed.  BELOW ARE SYMPTOMS THAT SHOULD BE REPORTED IMMEDIATELY: *FEVER GREATER THAN 100.4 F (38 C) OR HIGHER *CHILLS OR SWEATING *NAUSEA AND VOMITING THAT IS NOT CONTROLLED WITH YOUR NAUSEA MEDICATION *UNUSUAL SHORTNESS OF BREATH *UNUSUAL BRUISING OR BLEEDING *URINARY PROBLEMS (pain or burning when urinating, or frequent urination) *BOWEL PROBLEMS (unusual diarrhea, constipation, pain near the anus) TENDERNESS IN MOUTH AND THROAT WITH OR WITHOUT PRESENCE OF ULCERS (sore throat, sores in mouth, or a toothache) UNUSUAL RASH, SWELLING OR PAIN  UNUSUAL VAGINAL DISCHARGE OR ITCHING   Items with * indicate a potential emergency and should be followed up as soon as possible or go to the Emergency Department if any problems should occur.  Please show the CHEMOTHERAPY ALERT CARD or IMMUNOTHERAPY ALERT CARD at check-in to the  Emergency Department and triage nurse. Should you have questions after your visit or need to cancel or reschedule your appointment, please contact [facility]  [phone] and follow the prompts.  Office hours are 8:00 a.m. to 4:30 p.m. Monday - Friday. Please note that voicemails left after 4:00 p.m. may not be returned until the following business day.  We are closed weekends and major holidays. You have access to a nurse at all times for urgent questions. Please call the main number to the clinic [phone] and follow the prompts.  For any non-urgent questions, you may also contact your provider using MyChart. We now offer e-Visits for anyone 18 and older to request care online for non-urgent symptoms. For details visit [email or web address].   Also download the MyChart app! Go to the app store, search "MyChart", open the app, select [facility], and log in with your MyChart username and password.  Masks are optional in the cancer centers. If you would like for your care team to wear a mask while they are taking care of you, please let them know. You may have one support person who is at least 73 years old accompany you for your appointments. 

## 2022-09-16 NOTE — Progress Notes (Signed)
Hematology and Oncology Follow Up Visit  Marcia [name] [id number] 1951/07/25 71 y.o. 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 She is doing well and has no complaints at this time.  We continue to follow along closely with her protein studies. Kappa light chains have been slowly increasing. Last month no M-spike was observed, IgG level was 392 mg/dL and kappa light chains 77.4 mg/L (previously 50.8 mg/L) Same occasional SOB with over exertion. She takes a break to rest when needed.  No fever, chills, n/v, cough, rash, dizziness, chest pain, palpitations, abdominal pain or changes in bowel or bladder habits.  No swelling or tenderness in her extremities. 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  No falls or syncope reported.  Appetite and hydration are good. Weight is 227 lbs.   ECOG Performance Status: 1 - Symptomatic but completely ambulatory  Medications:  Allergies as of 09/16/2022       Reactions   Heparin Other (See Comments)   HIT   Sulfamethoxazole-trimethoprim Other (See Comments), Rash   ? Renal failure.   Zofran [ondansetron] Shortness Of Breath   "felt like throat was closing"   Clarithromycin Diarrhea, Nausea And Vomiting   Latex Rash   Macrodantin [nitrofurantoin] Rash        Medication List         Accurate as of September 16, 2022  9:24 AM. If you have any questions, ask your nurse or doctor.          acetaminophen 325 MG tablet Commonly known as: TYLENOL Take 650 mg by mouth daily as needed for moderate pain or headache.   dexamethasone 6 MG tablet Commonly known as: DECADRON Take 2 tablets (12 mg total) by mouth daily. Take 30 minutes prior to treatment.   Eliquis 2.5 MG Tabs tablet Generic drug: apixaban TAKE 1 TABLET BY MOUTH TWICE A DAY   famciclovir 250 MG tablet Commonly known as: FAMVIR TAKE 1 TABLET BY MOUTH EVERY DAY   feeding supplement Liqd Take 237 mLs by mouth 3 (three) times daily between meals. What changed: when to take this   folic acid 1 MG tablet Commonly known as: FOLVITE TAKE 2 TABLETS (2 MG TOTAL) BY MOUTH DAILY.   HYDROcodone-acetaminophen 5-325 MG tablet Commonly known as: NORCO/VICODIN Take 1 tablet by mouth every 4 (four) hours as needed.   lidocaine-prilocaine cream Commonly known as: EMLA Apply 1 application topically as needed (local anesthetic).   LORazepam 0.5 MG tablet Commonly known as: ATIVAN Take 1 tablet by mouth daily as needed.   losartan 25 MG tablet Commonly known as: COZAAR Take 25 mg by mouth daily.   losartan 25 MG tablet Commonly known as: COZAAR Take 1 tablet by mouth daily.   montelukast 10 MG tablet Commonly known as: Singulair Take 1 tablet (10 mg total) by mouth at bedtime. Start the Singulair on 03/14/2022  mupirocin ointment 2 % Commonly known as: BACTROBAN   NEOMYCIN-BACITRACIN ZN-POLYMYX OP LOCATION: BOTH EYES. APPLY TO THE LID MARGINS TWICE A DAY   NON FORMULARY Nerve Savior 2 capsules daily   Pomalyst 3 MG capsule Generic drug: pomalidomide   traMADol 50 MG tablet Commonly known as: ULTRAM Take 1 tablet by mouth every 12 (twelve) hours as needed.   VITAMIN E PO        Allergies:  Allergies  Allergen Reactions   Heparin Other (See Comments)    HIT    Sulfamethoxazole-Trimethoprim Other (See Comments) and Rash    ? Renal failure.   Zofran [Ondansetron] Shortness Of Breath    "felt like throat was closing"   Clarithromycin Diarrhea and Nausea And Vomiting   Latex Rash   Macrodantin [Nitrofurantoin] Rash    Past Medical History, Surgical history, Social history, and Family History were reviewed and updated.  Review of Systems: All other 10 point review of systems is negative.   Physical Exam:  vitals were not taken for this visit.   Wt Readings from Last 3 Encounters:  08/19/22 230 lb (104.3 kg)  07/22/22 229 lb 0.6 oz (103.9 kg)  06/20/22 231 lb (104.8 kg)    Ocular: Sclerae unicteric, pupils equal, round and reactive to light Ear-nose-throat: Oropharynx clear, dentition fair Lymphatic: No cervical or supraclavicular adenopathy Lungs no rales or rhonchi, good excursion bilaterally Heart regular rate and rhythm, no murmur appreciated Abd soft, nontender, positive bowel sounds MSK no focal spinal tenderness, no joint edema Neuro: non-focal, well-oriented, appropriate affect Breasts: Deferred   Lab Results  Component Value Date   WBC 10.6 (H) 08/26/2022   HGB 13.7 08/26/2022   HCT 41.9 08/26/2022   MCV 99.8 08/26/2022   PLT 189 08/26/2022   Lab Results  Component Value Date   FERRITIN 820 (H) 05/17/2018   IRON 272 (H) 05/17/2018   TIBC 295 05/17/2018   UIBC 23 05/17/2018   IRONPCTSAT 92 (H) 05/17/2018   Lab Results  Component Value Date   RETICCTPCT 1.3 03/27/2011   RBC 4.20 08/26/2022   RETICCTABS 56.4 03/27/2011   Lab Results  Component Value Date   KPAFRELGTCHN 77.4 (H) 08/19/2022   LAMBDASER 8.5 08/19/2022   KAPLAMBRATIO 9.11 (H) 08/19/2022   Lab Results  Component Value Date   IGGSERUM 392 (L) 08/19/2022   IGA 28 (L) 08/19/2022   IGMSERUM 27 08/19/2022   Lab Results  Component Value Date   TOTALPROTELP 5.8 (L) 08/19/2022   ALBUMINELP 3.2 08/19/2022   A1GS 0.2 08/19/2022   A2GS 0.9 08/19/2022    BETS 1.2 08/19/2022   BETA2SER 0.4 07/02/2015   GAMS 0.3 (L) 08/19/2022   MSPIKE Not Observed 08/19/2022   SPEI Comment 08/19/2022     Chemistry      Component Value Date/Time   NA 140 08/26/2022 0750   NA 141 07/06/2017 0816   NA 141 01/05/2017 0956   K 3.9 08/26/2022 0750   K 3.6 07/06/2017 0816   K 4.3 01/05/2017 0956   CL 104 08/26/2022 0750   CL 101 07/06/2017 0816   CO2 25 08/26/2022 0750   CO2 28 07/06/2017 0816   CO2 23 01/05/2017 0956   BUN 23 08/26/2022 0750   BUN 28 (H) 07/06/2017 0816   BUN 22.4 01/05/2017 0956   CREATININE 1.40 (H) 08/26/2022 0750   CREATININE 1.6 (H) 07/06/2017 0816   CREATININE 1.4 (H) 01/05/2017 0956      Component Value Date/Time   CALCIUM 8.9  08/26/2022 0750   CALCIUM 9.1 07/06/2017 0816   CALCIUM 9.3 01/05/2017 0956   ALKPHOS 65 08/26/2022 0750   ALKPHOS 58 07/06/2017 0816   ALKPHOS 52 01/05/2017 0956   AST 19 08/26/2022 0750   AST 14 01/05/2017 0956   ALT 22 08/26/2022 0750   ALT 58 (H) 07/06/2017 0816   ALT 13 01/05/2017 0956   BILITOT 0.7 08/26/2022 0750   BILITOT 0.56 01/05/2017 0956       Impression and Plan: Marcia [name] is a very pleasant 71 yo caucasian female with kappa light chain myeloma that has recurred again.   Protein studies are pending. We are monitoring increase in kappa light chains closely. We will proceed with treatment today as planned.  Follow-up in 4 weeks.   Lottie Dawson, NP 10/24/20239:24 AM

## 2022-09-17 ENCOUNTER — Other Ambulatory Visit: Payer: Self-pay

## 2022-09-17 LAB — KAPPA/LAMBDA LIGHT CHAINS
Kappa free light chain: 143.8 mg/L — ABNORMAL HIGH (ref 3.3–19.4)
Kappa, lambda light chain ratio: 16.92 — ABNORMAL HIGH (ref 0.26–1.65)
Lambda free light chains: 8.5 mg/L (ref 5.7–26.3)

## 2022-09-17 LAB — IGG, IGA, IGM
IgA: 27 mg/dL — ABNORMAL LOW (ref 64–422)
IgG (Immunoglobin G), Serum: 371 mg/dL — ABNORMAL LOW (ref 586–1602)
IgM (Immunoglobulin M), Srm: 30 mg/dL (ref 26–217)

## 2022-09-18 LAB — PROTEIN ELECTROPHORESIS, SERUM
A/G Ratio: 1.4 (ref 0.7–1.7)
Albumin ELP: 3.4 g/dL (ref 2.9–4.4)
Alpha-1-Globulin: 0.2 g/dL (ref 0.0–0.4)
Alpha-2-Globulin: 0.8 g/dL (ref 0.4–1.0)
Beta Globulin: 1.2 g/dL (ref 0.7–1.3)
Gamma Globulin: 0.4 g/dL (ref 0.4–1.8)
Globulin, Total: 2.5 g/dL (ref 2.2–3.9)
Total Protein ELP: 5.9 g/dL — ABNORMAL LOW (ref 6.0–8.5)

## 2022-09-19 ENCOUNTER — Other Ambulatory Visit: Payer: Self-pay

## 2022-09-22 ENCOUNTER — Other Ambulatory Visit: Payer: Self-pay | Admitting: Hematology & Oncology

## 2022-09-23 ENCOUNTER — Telehealth: Payer: Self-pay | Admitting: Family

## 2022-09-23 NOTE — Telephone Encounter (Signed)
I was able to go over recent lab work with the patient. Her light chains have continued to go up. We will get her back in next week to speak with Dr. [name] regarding discontinuing the Velcade and adding Kyprolis to her regimen. Patient in agreement and appreciative of call. No questions or concerns at this time.

## 2022-09-30 ENCOUNTER — Other Ambulatory Visit: Payer: Self-pay

## 2022-10-02 ENCOUNTER — Inpatient Hospital Stay: Payer: Medicare Other

## 2022-10-02 ENCOUNTER — Inpatient Hospital Stay (HOSPITAL_BASED_OUTPATIENT_CLINIC_OR_DEPARTMENT_OTHER): Payer: Medicare Other | Admitting: Hematology & Oncology

## 2022-10-02 ENCOUNTER — Inpatient Hospital Stay: Payer: Medicare Other | Attending: Hematology & Oncology

## 2022-10-02 ENCOUNTER — Encounter: Payer: Self-pay | Admitting: Hematology & Oncology

## 2022-10-02 DIAGNOSIS — Z5112 Encounter for antineoplastic immunotherapy: Secondary | ICD-10-CM | POA: Insufficient documentation

## 2022-10-02 DIAGNOSIS — C50911 Malignant neoplasm of unspecified site of right female breast: Secondary | ICD-10-CM

## 2022-10-02 DIAGNOSIS — C9001 Multiple myeloma in remission: Secondary | ICD-10-CM | POA: Diagnosis not present

## 2022-10-02 DIAGNOSIS — Z7901 Long term (current) use of anticoagulants: Secondary | ICD-10-CM | POA: Insufficient documentation

## 2022-10-02 DIAGNOSIS — I82403 Acute embolism and thrombosis of unspecified deep veins of lower extremity, bilateral: Secondary | ICD-10-CM | POA: Insufficient documentation

## 2022-10-02 DIAGNOSIS — Z5111 Encounter for antineoplastic chemotherapy: Secondary | ICD-10-CM | POA: Diagnosis present

## 2022-10-02 DIAGNOSIS — C9000 Multiple myeloma not having achieved remission: Secondary | ICD-10-CM | POA: Diagnosis not present

## 2022-10-02 DIAGNOSIS — I2699 Other pulmonary embolism without acute cor pulmonale: Secondary | ICD-10-CM | POA: Insufficient documentation

## 2022-10-02 LAB — CMP (CANCER CENTER ONLY)
ALT: 12 U/L (ref 0–44)
AST: 14 U/L — ABNORMAL LOW (ref 15–41)
Albumin: 4.3 g/dL (ref 3.5–5.0)
Alkaline Phosphatase: 53 U/L (ref 38–126)
Anion gap: 9 (ref 5–15)
BUN: 24 mg/dL — ABNORMAL HIGH (ref 8–23)
CO2: 28 mmol/L (ref 22–32)
Calcium: 9.5 mg/dL (ref 8.9–10.3)
Chloride: 103 mmol/L (ref 98–111)
Creatinine: 1.32 mg/dL — ABNORMAL HIGH (ref 0.44–1.00)
GFR, Estimated: 43 mL/min — ABNORMAL LOW (ref >60)
Glucose, Bld: 103 mg/dL — ABNORMAL HIGH (ref 70–99)
Potassium: 4 mmol/L (ref 3.5–5.1)
Sodium: 140 mmol/L (ref 135–145)
Total Bilirubin: 0.7 mg/dL (ref 0.3–1.2)
Total Protein: 6.6 g/dL (ref 6.5–8.1)

## 2022-10-02 LAB — CBC WITH DIFFERENTIAL (CANCER CENTER ONLY)
Abs Immature Granulocytes: 0.04 10*3/uL (ref 0.00–0.07)
Basophils Absolute: 0 10*3/uL (ref 0.0–0.1)
Basophils Relative: 1 %
Eosinophils Absolute: 0.1 10*3/uL (ref 0.0–0.5)
Eosinophils Relative: 1 %
HCT: 41.3 % (ref 36.0–46.0)
Hemoglobin: 13.5 g/dL (ref 12.0–15.0)
Immature Granulocytes: 1 %
Lymphocytes Relative: 49 %
Lymphs Abs: 4.4 10*3/uL — ABNORMAL HIGH (ref 0.7–4.0)
MCH: 32.1 pg (ref 26.0–34.0)
MCHC: 32.7 g/dL (ref 30.0–36.0)
MCV: 98.3 fL (ref 80.0–100.0)
Monocytes Absolute: 0.4 10*3/uL (ref 0.1–1.0)
Monocytes Relative: 5 %
Neutro Abs: 3.8 10*3/uL (ref 1.7–7.7)
Neutrophils Relative %: 43 %
Platelet Count: 185 10*3/uL (ref 150–400)
RBC: 4.2 MIL/uL (ref 3.87–5.11)
RDW: 14.2 % (ref 11.5–15.5)
WBC Count: 8.8 10*3/uL (ref 4.0–10.5)
nRBC: 0 % (ref 0.0–0.2)

## 2022-10-02 LAB — LACTATE DEHYDROGENASE: LDH: 148 U/L (ref 98–192)

## 2022-10-02 MED ORDER — SODIUM CHLORIDE 0.9% FLUSH
10.0000 mL | Freq: Once | INTRAVENOUS | Status: AC
  Administered 2022-10-02: 10 mL via INTRAVENOUS

## 2022-10-02 NOTE — Progress Notes (Signed)
DISCONTINUE ON PATHWAY REGIMEN - Breast     A cycle is every 21 days:     Trastuzumab-xxxx   **Always confirm dose/schedule in your pharmacy ordering system**  REASON: Adjuvant Therapy Completed PRIOR TREATMENT: [id number]: Trastuzumab q21 Days to Complete One Year of Therapy TREATMENT RESPONSE: Partial Response (PR)  Breast - No Medical Intervention - Off Treatment.  Patient Characteristics: Post-Neoadjuvant Therapy and Resection, HER2 Positive, ER Negative/Unknown, No Residual Disease, Adjuvant Targeted Therapy After Neoadjuvant Chemo/Targeted Therapy ER Status: Negative (-) HER2 Status: Positive (+) PR Status: Negative (-) Residual Invasive Disease Post-Neoadjuvant Therapy<= No

## 2022-10-02 NOTE — Progress Notes (Signed)
DISCONTINUE ON PATHWAY REGIMEN - Breast  No Medical Intervention - Off Treatment.  REASON: Other Reason PRIOR TREATMENT: Off Treatment  START OFF PATHWAY REGIMEN - Breast   [id number] (Daratumumab IV + Once Weekly Carfilzomib IV + Dexamethasone PO/IV) q28 Days:   Cycle 1: A cycle is 28 days:     Dexamethasone      Carfilzomib      Carfilzomib      Daratumumab    Cycle 2: A cycle is 28 days:     Dexamethasone      Carfilzomib      Daratumumab    Cycles 3 through 6: A cycle is every 28 days:     Dexamethasone      Dexamethasone      Carfilzomib      Daratumumab    Cycles 7 and beyond: A cycle is every 28 days:     Dexamethasone      Dexamethasone      Carfilzomib      Daratumumab   **Always confirm dose/schedule in your pharmacy ordering system**  Patient Characteristics: Distant Metastases or Locoregional Recurrent Disease - Unresected or Locally Advanced Unresectable Disease Progressing after Neoadjuvant and Local Therapies, ER Negative, Chemotherapy, HER2 Low, Third Line and Beyond, Prior or Contraindicated  Anthracycline and Prior or Contraindicated Eribulin Therapeutic Status: Distant Metastases HER2 Status: Unknown ER Status: Negative (-) PR Status: Negative (-) Therapy Approach Indicated: Standard Chemotherapy/Endocrine Therapy Line of Therapy: Third Line and Beyond Intent of Therapy: Non-Curative / Palliative Intent, Discussed with Patient

## 2022-10-02 NOTE — Patient Instructions (Signed)

## 2022-10-02 NOTE — Progress Notes (Signed)
Hematology and Oncology Follow Up Visit  Marcia [name] [id number] [birth year] 71 y.o. 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  Unfortunately, we are now seeing progressive disease.  Her kappa light chain continues to go up.  This has been going on for the past 2 or 3 months.  When we  last checked, the Kappa light chain was 14.4 mg/dL.  I think we are going to have to make a change with her protocol.  I think we will have to take out the Velcade and use Kyprolis.  I think this would be reasonable.  She does have the Port-A-Cath in.  She feels okay.  She really has had no specific complaints.  There is no fatigue or weakness.  She has had no nausea or vomiting.  She has had no problems with bowels or bladder.  She has had no bleeding.  There is been no fever.  Her appetite has been okay.  She is looking forward to a nice Thanksgiving with her family.  Overall, I would say performance status is probably ECOG 1.    Medications:  Allergies as of 10/02/2022       Reactions   Heparin Other (See Comments)   HIT   Sulfamethoxazole-trimethoprim Other (See Comments), Rash   ? Renal failure.   Zofran [ondansetron] Shortness Of Breath   "felt like throat was closing"   Clarithromycin Diarrhea, Nausea And Vomiting    Latex Rash   Macrodantin [nitrofurantoin] Rash        Medication List        Accurate as of October 02, 2022 10:52 AM. If you have any questions, ask your nurse or doctor.          STOP taking these medications    HYDROcodone-acetaminophen 5-325 MG tablet Commonly known as: NORCO/VICODIN Stopped by: [name], MD   lidocaine-prilocaine cream Commonly known as: EMLA Stopped by: [name], MD       TAKE these medications    acetaminophen 325 MG tablet Commonly known as: TYLENOL Take 650 mg by mouth daily as needed for moderate pain or headache.   dexamethasone 6 MG tablet Commonly known as: DECADRON Take 2 tablets (12 mg total) by mouth daily. Take 30 minutes prior to treatment.   Eliquis 2.5 MG Tabs tablet Generic drug: apixaban TAKE 1 TABLET BY MOUTH TWICE A DAY   famciclovir 250 MG tablet Commonly known as: FAMVIR TAKE 1 TABLET BY MOUTH EVERY DAY   feeding supplement Liqd Take 237 mLs by mouth 3 (three) times daily between meals. What changed: when to take this   folic acid 1 MG tablet Commonly known as: FOLVITE TAKE 2 TABLETS (2 MG TOTAL) BY MOUTH DAILY.   LORazepam 0.5 MG tablet Commonly known as: ATIVAN Take 1 tablet by mouth daily as needed.   losartan  25 MG tablet Commonly known as: COZAAR Take 25 mg by mouth daily. What changed: Another medication with the same name was removed. Continue taking this medication, and follow the directions you see here. Changed by: [name], MD   montelukast 10 MG tablet Commonly known as: Singulair Take 1 tablet (10 mg total) by mouth at bedtime. Start the Singulair on 03/14/2022   mupirocin ointment 2 % Commonly known as: BACTROBAN   NEOMYCIN-BACITRACIN ZN-POLYMYX OP LOCATION: BOTH EYES. APPLY TO THE LID MARGINS TWICE A DAY   NON FORMULARY Nerve Savior 2 capsules daily   Pomalyst 3 MG capsule Generic drug: pomalidomide   traMADol 50 MG tablet Commonly known as: ULTRAM Take 1  tablet by mouth every 12 (twelve) hours as needed.   VITAMIN E PO daily.        Allergies:  Allergies  Allergen Reactions   Heparin Other (See Comments)    HIT   Sulfamethoxazole-Trimethoprim Other (See Comments) and Rash    ? Renal failure.   Zofran [Ondansetron] Shortness Of Breath    "felt like throat was closing"   Clarithromycin Diarrhea and Nausea And Vomiting   Latex Rash   Macrodantin [Nitrofurantoin] Rash    Past Medical History, Surgical history, Social history, and Family History were reviewed and updated.  Review of Systems: Review of Systems  Constitutional: Negative.   HENT: Negative.    Eyes: Negative.   Respiratory: Negative.    Cardiovascular: Negative.   Gastrointestinal: Negative.   Genitourinary: Negative.   Musculoskeletal: Negative.   Skin: Negative.   Neurological: Negative.   Endo/Heme/Allergies: Negative.   Psychiatric/Behavioral: Negative.        Physical Exam:  height is 4' 10.5" (1.486 m) (pended) and weight is 227 lb 12.8 oz (103.3 kg) (pended).   Wt Readings from Last 3 Encounters:  10/02/22 (P) 227 lb 12.8 oz (103.3 kg)  09/16/22 227 lb (103 kg)  08/19/22 230 lb (104.3 kg)    Physical Exam Vitals reviewed.  HENT:     Head: Normocephalic and atraumatic.  Eyes:     Pupils: Pupils are equal, round, and reactive to light.  Cardiovascular:     Rate and Rhythm: Normal rate and regular rhythm.     Heart sounds: Normal heart sounds.  Pulmonary:     Effort: Pulmonary effort is normal.     Breath sounds: Normal breath sounds.  Abdominal:     General: Bowel sounds are normal.     Palpations: Abdomen is soft.  Musculoskeletal:        General: No tenderness or deformity. Normal range of motion.     Cervical back: Normal range of motion.  Lymphadenopathy:     Cervical: No cervical adenopathy.  Skin:    General: Skin is warm and dry.     Findings: No erythema or rash.  Neurological:     Mental Status: She is alert and oriented  to person, place, and time.  Psychiatric:        Behavior: Behavior normal.        Thought Content: Thought content normal.        Judgment: Judgment normal.      Lab Results  Component Value Date   WBC 8.8 10/02/2022   HGB 13.5 10/02/2022   HCT 41.3 10/02/2022   MCV 98.3 10/02/2022   PLT 185 10/02/2022   Lab Results  Component Value Date   FERRITIN 820 (H) 05/17/2018   IRON 272 (H) 05/17/2018   TIBC 295  05/17/2018   UIBC 23 05/17/2018   IRONPCTSAT 92 (H) 05/17/2018   Lab Results  Component Value Date   RETICCTPCT 1.3 03/27/2011   RBC 4.20 10/02/2022   RETICCTABS 56.4 03/27/2011   Lab Results  Component Value Date   KPAFRELGTCHN 143.8 (H) 09/16/2022   LAMBDASER 8.5 09/16/2022   KAPLAMBRATIO 16.92 (H) 09/16/2022   Lab Results  Component Value Date   IGGSERUM 371 (L) 09/16/2022   IGA 27 (L) 09/16/2022   IGMSERUM 30 09/16/2022   Lab Results  Component Value Date   TOTALPROTELP 5.9 (L) 09/16/2022   ALBUMINELP 3.4 09/16/2022   A1GS 0.2 09/16/2022   A2GS 0.8 09/16/2022   BETS 1.2 09/16/2022   BETA2SER 0.4 07/02/2015   GAMS 0.4 09/16/2022   MSPIKE Not Observed 09/16/2022   SPEI Comment 09/16/2022     Chemistry      Component Value Date/Time   NA 140 10/02/2022 0929   NA 141 07/06/2017 0816   NA 141 01/05/2017 0956   K 4.0 10/02/2022 0929   K 3.6 07/06/2017 0816   K 4.3 01/05/2017 0956   CL 103 10/02/2022 0929   CL 101 07/06/2017 0816   CO2 28 10/02/2022 0929   CO2 28 07/06/2017 0816   CO2 23 01/05/2017 0956   BUN 24 (H) 10/02/2022 0929   BUN 28 (H) 07/06/2017 0816   BUN 22.4 01/05/2017 0956   CREATININE 1.32 (H) 10/02/2022 0929   CREATININE 1.6 (H) 07/06/2017 0816   CREATININE 1.4 (H) 01/05/2017 0956      Component Value Date/Time   CALCIUM 9.5 10/02/2022 0929   CALCIUM 9.1 07/06/2017 0816   CALCIUM 9.3 01/05/2017 0956   ALKPHOS 53 10/02/2022 0929   ALKPHOS 58 07/06/2017 0816   ALKPHOS 52 01/05/2017 0956   AST 14 (L) 10/02/2022 0929   AST  14 01/05/2017 0956   ALT 12 10/02/2022 0929   ALT 58 (H) 07/06/2017 0816   ALT 13 01/05/2017 0956   BILITOT 0.7 10/02/2022 0929   BILITOT 0.56 01/05/2017 0956       Impression and Plan: Ms. Pates is a very pleasant 71 yo caucasian female with kappa light chain myeloma that has recurred again.    Again, I worried that the myeloma is progressing again.  The Kappa light chain level is going up.  I think that it is a good idea to make a change with her protocol.  I will take out the Velcade and put in Kyprolis.  I think this is very reasonable.  At some point, we may have to think about CAR-T therapy.   We have been at the myeloma treatment now for over 16 years.  She is done incredibly well.  She has had the breast cancer.  This, thankfully is not a problem at the present time.  We will plan to have her come back in a couple weeks we will start the new protocol.    [name], MD 11/9/202310:52 AM

## 2022-10-03 LAB — KAPPA/LAMBDA LIGHT CHAINS
Kappa free light chain: 170.1 mg/L — ABNORMAL HIGH (ref 3.3–19.4)
Kappa, lambda light chain ratio: 19.11 — ABNORMAL HIGH (ref 0.26–1.65)
Lambda free light chains: 8.9 mg/L (ref 5.7–26.3)

## 2022-10-04 LAB — IGG, IGA, IGM
IgA: 27 mg/dL — ABNORMAL LOW (ref 64–422)
IgG (Immunoglobin G), Serum: 379 mg/dL — ABNORMAL LOW (ref 586–1602)
IgM (Immunoglobulin M), Srm: 32 mg/dL (ref 26–217)

## 2022-10-06 LAB — PROTEIN ELECTROPHORESIS, SERUM
A/G Ratio: 1.6 (ref 0.7–1.7)
Albumin ELP: 3.7 g/dL (ref 2.9–4.4)
Alpha-1-Globulin: 0.2 g/dL (ref 0.0–0.4)
Alpha-2-Globulin: 0.7 g/dL (ref 0.4–1.0)
Beta Globulin: 1 g/dL (ref 0.7–1.3)
Gamma Globulin: 0.3 g/dL — ABNORMAL LOW (ref 0.4–1.8)
Globulin, Total: 2.3 g/dL (ref 2.2–3.9)
Total Protein ELP: 6 g/dL (ref 6.0–8.5)

## 2022-10-08 ENCOUNTER — Other Ambulatory Visit: Payer: Self-pay | Admitting: Hematology & Oncology

## 2022-10-08 DIAGNOSIS — I824Z3 Acute embolism and thrombosis of unspecified deep veins of distal lower extremity, bilateral: Secondary | ICD-10-CM

## 2022-10-08 DIAGNOSIS — I2601 Septic pulmonary embolism with acute cor pulmonale: Secondary | ICD-10-CM

## 2022-10-09 ENCOUNTER — Other Ambulatory Visit: Payer: Self-pay

## 2022-10-14 ENCOUNTER — Other Ambulatory Visit: Payer: Medicare Other

## 2022-10-14 ENCOUNTER — Inpatient Hospital Stay: Payer: Medicare Other

## 2022-10-14 ENCOUNTER — Encounter: Payer: Self-pay | Admitting: Hematology & Oncology

## 2022-10-14 ENCOUNTER — Other Ambulatory Visit: Payer: Self-pay

## 2022-10-14 ENCOUNTER — Inpatient Hospital Stay (HOSPITAL_BASED_OUTPATIENT_CLINIC_OR_DEPARTMENT_OTHER): Payer: Medicare Other | Admitting: Hematology & Oncology

## 2022-10-14 ENCOUNTER — Ambulatory Visit: Payer: Medicare Other

## 2022-10-14 ENCOUNTER — Ambulatory Visit: Payer: Medicare Other | Admitting: Family

## 2022-10-14 VITALS — BP 153/92 | HR 84 | Temp 98.2°F | Resp 18 | Ht 58.5 in | Wt 229.0 lb

## 2022-10-14 DIAGNOSIS — C9001 Multiple myeloma in remission: Secondary | ICD-10-CM

## 2022-10-14 DIAGNOSIS — C50911 Malignant neoplasm of unspecified site of right female breast: Secondary | ICD-10-CM

## 2022-10-14 DIAGNOSIS — Z5111 Encounter for antineoplastic chemotherapy: Secondary | ICD-10-CM | POA: Diagnosis not present

## 2022-10-14 LAB — CMP (CANCER CENTER ONLY)
ALT: 10 U/L (ref 0–44)
AST: 13 U/L — ABNORMAL LOW (ref 15–41)
Albumin: 4.2 g/dL (ref 3.5–5.0)
Alkaline Phosphatase: 53 U/L (ref 38–126)
Anion gap: 9 (ref 5–15)
BUN: 19 mg/dL (ref 8–23)
CO2: 27 mmol/L (ref 22–32)
Calcium: 9.5 mg/dL (ref 8.9–10.3)
Chloride: 104 mmol/L (ref 98–111)
Creatinine: 1.29 mg/dL — ABNORMAL HIGH (ref 0.44–1.00)
GFR, Estimated: 44 mL/min — ABNORMAL LOW (ref >60)
Glucose, Bld: 100 mg/dL — ABNORMAL HIGH (ref 70–99)
Potassium: 3.8 mmol/L (ref 3.5–5.1)
Sodium: 140 mmol/L (ref 135–145)
Total Bilirubin: 0.6 mg/dL (ref 0.3–1.2)
Total Protein: 6.2 g/dL — ABNORMAL LOW (ref 6.5–8.1)

## 2022-10-14 LAB — CBC WITH DIFFERENTIAL (CANCER CENTER ONLY)
Abs Immature Granulocytes: 0.02 10*3/uL (ref 0.00–0.07)
Basophils Absolute: 0 10*3/uL (ref 0.0–0.1)
Basophils Relative: 0 %
Eosinophils Absolute: 0.1 10*3/uL (ref 0.0–0.5)
Eosinophils Relative: 2 %
HCT: 40.5 % (ref 36.0–46.0)
Hemoglobin: 13.2 g/dL (ref 12.0–15.0)
Immature Granulocytes: 0 %
Lymphocytes Relative: 54 %
Lymphs Abs: 4.3 10*3/uL — ABNORMAL HIGH (ref 0.7–4.0)
MCH: 32.4 pg (ref 26.0–34.0)
MCHC: 32.6 g/dL (ref 30.0–36.0)
MCV: 99.5 fL (ref 80.0–100.0)
Monocytes Absolute: 0.4 10*3/uL (ref 0.1–1.0)
Monocytes Relative: 5 %
Neutro Abs: 3.1 10*3/uL (ref 1.7–7.7)
Neutrophils Relative %: 39 %
Platelet Count: 203 10*3/uL (ref 150–400)
RBC: 4.07 MIL/uL (ref 3.87–5.11)
RDW: 14.2 % (ref 11.5–15.5)
WBC Count: 7.9 10*3/uL (ref 4.0–10.5)
nRBC: 0 % (ref 0.0–0.2)

## 2022-10-14 LAB — LACTATE DEHYDROGENASE: LDH: 149 U/L (ref 98–192)

## 2022-10-14 MED ORDER — SODIUM CHLORIDE 0.9% FLUSH
10.0000 mL | INTRAVENOUS | Status: DC | PRN
  Administered 2022-10-14: 10 mL

## 2022-10-14 MED ORDER — PROCHLORPERAZINE MALEATE 10 MG PO TABS: 10.0000 mg | ORAL_TABLET | Freq: Four times a day (QID) | ORAL | 1 refills | Status: DC | PRN

## 2022-10-14 MED ORDER — DIPHENHYDRAMINE HCL 25 MG PO CAPS: 50.0000 mg | ORAL_CAPSULE | Freq: Once | ORAL | Status: DC

## 2022-10-14 MED ORDER — DEXTROSE 5 % IV SOLN
20.0000 mg/m2 | Freq: Once | INTRAVENOUS | Status: AC
  Administered 2022-10-14: 40 mg via INTRAVENOUS
  Filled 2022-10-14: qty 15

## 2022-10-14 MED ORDER — SODIUM CHLORIDE 0.9 % IV SOLN: Freq: Once | INTRAVENOUS | Status: AC

## 2022-10-14 MED ORDER — ACETAMINOPHEN 325 MG PO TABS: 650.0000 mg | ORAL_TABLET | Freq: Once | ORAL | Status: DC

## 2022-10-14 MED ORDER — HEPARIN SOD (PORK) LOCK FLUSH 100 UNIT/ML IV SOLN: 500.0000 [IU] | Freq: Once | INTRAVENOUS | Status: DC | PRN

## 2022-10-14 MED ORDER — DEXAMETHASONE 4 MG PO TABS: 12.0000 mg | ORAL_TABLET | Freq: Once | ORAL | Status: DC

## 2022-10-14 MED ORDER — MONTELUKAST SODIUM 10 MG PO TABS
10.0000 mg | ORAL_TABLET | Freq: Once | ORAL | Status: DC
  Filled 2022-10-14: qty 1

## 2022-10-14 MED ORDER — DEXAMETHASONE 4 MG PO TABS: 20.0000 mg | ORAL_TABLET | Freq: Once | ORAL | Status: DC

## 2022-10-14 MED ORDER — DARATUMUMAB-HYALURONIDASE-FIHJ 1800-30000 MG-UT/15ML ~~LOC~~ SOLN
1800.0000 mg | Freq: Once | SUBCUTANEOUS | Status: AC
  Administered 2022-10-14: 1800 mg via SUBCUTANEOUS
  Filled 2022-10-14: qty 15

## 2022-10-14 NOTE — Progress Notes (Signed)
Hematology and Oncology Follow Up Visit  BENITA BOONSTRA [id number] 23-Feb-1951 71 y.o. 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 Farabee is here today for follow-up.  We have made a change in her protocol.  Her light chains were going up slowly but surely.  As such, we took out the Velcade and put in the Kyprolis.  Hopefully, this will help get her back into a good remission.  She feels okay.  She really has had no specific complaints.  She does not have a nice Thanksgiving with her family.  When we last saw her, her kappa light chain was 17.0 mg/dL.  She has had no fever.  She has had no change in bowel or bladder habits.  She has had no problems with respect to the breast cancer.  There is been no leg swelling.  Currently, I would say performance status is probably ECOG 1.   Medications:  Allergies as of 10/14/2022       Reactions   Heparin Other (See Comments)   HIT   Sulfamethoxazole-trimethoprim Other (See Comments), Rash   ? Renal failure.   Zofran [ondansetron] Shortness Of Breath   "felt like throat was closing"   Clarithromycin Diarrhea, Nausea And Vomiting   Latex Rash   Macrodantin [nitrofurantoin] Rash        Medication List        Accurate as of October 14, 2022  9:16 AM. If you have any questions, ask your nurse or doctor.          STOP taking these medications    mupirocin ointment 2 % Commonly known as: BACTROBAN Stopped by: [name], MD   NEOMYCIN-BACITRACIN ZN-POLYMYX OP Stopped by: [name], MD   Pomalyst 3 MG capsule Generic drug: pomalidomide Stopped by: [name], MD       TAKE these medications    acetaminophen 325 MG tablet Commonly known as: TYLENOL Take 650 mg by mouth daily as needed for moderate pain or headache.   dexamethasone 6 MG tablet Commonly known as: DECADRON Take 2 tablets (12 mg total) by mouth daily. Take 30 minutes prior to treatment.   Eliquis 2.5 MG Tabs tablet Generic drug: apixaban TAKE 1 TABLET BY MOUTH TWICE A DAY   famciclovir 250 MG tablet Commonly known as: FAMVIR TAKE 1 TABLET BY MOUTH EVERY DAY   feeding supplement Liqd Take 237 mLs by mouth 3 (three) times daily between meals. What changed: when to take this   folic acid 1 MG tablet Commonly known as: FOLVITE TAKE 2 TABLETS (2 MG TOTAL) BY MOUTH DAILY.   LORazepam 0.5 MG tablet Commonly known as: ATIVAN Take 1 tablet by mouth daily as needed.   losartan 25 MG tablet Commonly known as: COZAAR Take 25 mg by mouth daily.   montelukast 10 MG  tablet Commonly known as: Singulair Take 1 tablet (10 mg total) by mouth at bedtime. Start the Singulair on 03/14/2022   NON FORMULARY Nerve Savior 2 capsules daily   traMADol 50 MG tablet Commonly known as: ULTRAM Take 1 tablet by mouth every 12 (twelve) hours as needed.   VITAMIN E PO daily.        Allergies:  Allergies  Allergen Reactions   Heparin Other (See Comments)    HIT   Sulfamethoxazole-Trimethoprim Other (See Comments) and Rash    ? Renal failure.   Zofran [Ondansetron] Shortness Of Breath    "felt like throat was closing"   Clarithromycin Diarrhea and Nausea And Vomiting   Latex Rash   Macrodantin [Nitrofurantoin] Rash     Past Medical History, Surgical history, Social history, and Family History were reviewed and updated.  Review of Systems: Review of Systems  Constitutional: Negative.   HENT: Negative.    Eyes: Negative.   Respiratory: Negative.    Cardiovascular: Negative.   Gastrointestinal: Negative.   Genitourinary: Negative.   Musculoskeletal: Negative.   Skin: Negative.   Neurological: Negative.   Endo/Heme/Allergies: Negative.   Psychiatric/Behavioral: Negative.        Physical Exam:  height is 4' 10.5" (1.486 m) and weight is 229 lb (103.9 kg). Her oral temperature is 98.2 F (36.8 C). Her blood pressure is 153/92 (abnormal) and her pulse is 84. Her respiration is 18 and oxygen saturation is 100%.   Wt Readings from Last 3 Encounters:  10/14/22 229 lb (103.9 kg)  10/02/22 (P) 227 lb 12.8 oz (103.3 kg)  09/16/22 227 lb (103 kg)    Physical Exam Vitals reviewed.  HENT:     Head: Normocephalic and atraumatic.  Eyes:     Pupils: Pupils are equal, round, and reactive to light.  Cardiovascular:     Rate and Rhythm: Normal rate and regular rhythm.     Heart sounds: Normal heart sounds.  Pulmonary:     Effort: Pulmonary effort is normal.     Breath sounds: Normal breath sounds.  Abdominal:     General: Bowel sounds are normal.     Palpations: Abdomen is soft.  Musculoskeletal:        General: No tenderness or deformity. Normal range of motion.     Cervical back: Normal range of motion.  Lymphadenopathy:     Cervical: No cervical adenopathy.  Skin:    General: Skin is warm and dry.     Findings: No erythema or rash.  Neurological:     Mental Status: She is alert and oriented to person, place, and time.  Psychiatric:        Behavior: Behavior normal.        Thought Content: Thought content normal.        Judgment: Judgment normal.     Lab Results  Component Value Date   WBC 7.9 10/14/2022   HGB 13.2 10/14/2022   HCT 40.5 10/14/2022   MCV 99.5 10/14/2022   PLT 203  10/14/2022   Lab Results  Component Value Date   FERRITIN 820 (H) 05/17/2018   IRON 272 (H) 05/17/2018   TIBC 295 05/17/2018   UIBC 23 05/17/2018   IRONPCTSAT 92 (H) 05/17/2018   Lab Results  Component Value Date   RETICCTPCT 1.3 03/27/2011   RBC 4.07 10/14/2022   RETICCTABS 56.4 03/27/2011   Lab Results  Component Value Date   KPAFRELGTCHN 170.1 (H) 10/02/2022   LAMBDASER 8.9 10/02/2022   KAPLAMBRATIO  19.11 (H) 10/02/2022   Lab Results  Component Value Date   IGGSERUM 379 (L) 10/02/2022   IGA 27 (L) 10/02/2022   IGMSERUM 32 10/02/2022   Lab Results  Component Value Date   TOTALPROTELP 6.0 10/02/2022   ALBUMINELP 3.7 10/02/2022   A1GS 0.2 10/02/2022   A2GS 0.7 10/02/2022   BETS 1.0 10/02/2022   BETA2SER 0.4 07/02/2015   GAMS 0.3 (L) 10/02/2022   MSPIKE Not Observed 10/02/2022   SPEI Comment 10/02/2022     Chemistry      Component Value Date/Time   NA 140 10/14/2022 0809   NA 141 07/06/2017 0816   NA 141 01/05/2017 0956   K 3.8 10/14/2022 0809   K 3.6 07/06/2017 0816   K 4.3 01/05/2017 0956   CL 104 10/14/2022 0809   CL 101 07/06/2017 0816   CO2 27 10/14/2022 0809   CO2 28 07/06/2017 0816   CO2 23 01/05/2017 0956   BUN 19 10/14/2022 0809   BUN 28 (H) 07/06/2017 0816   BUN 22.4 01/05/2017 0956   CREATININE 1.29 (H) 10/14/2022 0809   CREATININE 1.6 (H) 07/06/2017 0816   CREATININE 1.4 (H) 01/05/2017 0956      Component Value Date/Time   CALCIUM 9.5 10/14/2022 0809   CALCIUM 9.1 07/06/2017 0816   CALCIUM 9.3 01/05/2017 0956   ALKPHOS 53 10/14/2022 0809   ALKPHOS 58 07/06/2017 0816   ALKPHOS 52 01/05/2017 0956   AST 13 (L) 10/14/2022 0809   AST 14 01/05/2017 0956   ALT 10 10/14/2022 0809   ALT 58 (H) 07/06/2017 0816   ALT 13 01/05/2017 0956   BILITOT 0.6 10/14/2022 0809   BILITOT 0.56 01/05/2017 0956       Impression and Plan: Ms. Bowley is a very pleasant 71 yo caucasian female with kappa light chain myeloma that has recurred again.     Again, we are making a switch over to Kyprolis instead of the Velcade along with Faspro.  Hopefully, we can see that her light chains will come down.  This is certainly complicated.  I know she has a history of breast cancer also.  Thankfully, I do not think this is a problem for [place] right now.  She is also on anticoagulation.  She has a history of a pulmonary emboli.  I think we have to keep her on anticoagulation while she is getting treated.  We will plan to have her come back to see [place] on 11/12/2022.  She will get her Kyprolis weekly for the next 2 weeks.      [name], MD 11/21/20239:16 AM

## 2022-10-14 NOTE — Patient Instructions (Signed)

## 2022-10-14 NOTE — Progress Notes (Signed)
Order received from Dr. [name] for pt to take Decadron 12 mg PO prior to Faspro and not Decadron 20 mg PO.  Pharmacy notified.

## 2022-10-14 NOTE — Progress Notes (Signed)
DISCONTINUE ON PATHWAY REGIMEN - Breast  No Medical Intervention - Off Treatment.  REASON: Other Reason PRIOR TREATMENT: Off Treatment  START OFF PATHWAY REGIMEN - Breast   [id number] (Daratumumab IV + Once Weekly Carfilzomib IV + Dexamethasone PO/IV) q28 Days:   Cycle 1: A cycle is 28 days:     Dexamethasone      Carfilzomib      Carfilzomib      Daratumumab    Cycle 2: A cycle is 28 days:     Dexamethasone      Carfilzomib      Daratumumab    Cycles 3 through 6: A cycle is every 28 days:     Dexamethasone      Dexamethasone      Carfilzomib      Daratumumab    Cycles 7 and beyond: A cycle is every 28 days:     Dexamethasone      Dexamethasone      Carfilzomib      Daratumumab   **Always confirm dose/schedule in your pharmacy ordering system**  Patient Characteristics: Distant Metastases or Locoregional Recurrent Disease - Unresected or Locally Advanced Unresectable Disease Progressing after Neoadjuvant and Local Therapies, ER Negative, Chemotherapy, HER2 Low, Third Line and Beyond, Prior or Contraindicated  Anthracycline and Prior or Contraindicated Eribulin Therapeutic Status: Distant Metastases HER2 Status: Unknown ER Status: Negative (-) PR Status: Negative (-) Therapy Approach Indicated: Standard Chemotherapy/Endocrine Therapy Line of Therapy: Third Line and Beyond Intent of Therapy: Non-Curative / Palliative Intent, Discussed with Patient

## 2022-10-14 NOTE — Progress Notes (Signed)
Dexamethasone premedications changed from 20 mg to 12 mg per Dr. Antonieta Pert instructions.

## 2022-10-14 NOTE — Patient Instructions (Signed)
[facility]  Discharge Instructions: Thank you for choosing [facility] to provide your oncology and hematology care.   If you have a lab appointment with the [facility], please go directly to the [facility] and check in at the registration area.  Wear comfortable clothing and clothing appropriate for easy access to any Portacath or PICC line.   We strive to give you quality time with your provider. You may need to reschedule your appointment if you arrive late (15 or more minutes).  Arriving late affects you and other patients whose appointments are after yours.  Also, if you miss three or more appointments without notifying the office, you may be dismissed from the clinic at the provider's discretion.      For prescription refill requests, have your pharmacy contact our office and allow 72 hours for refills to be completed.    Today you received the following chemotherapy and/or immunotherapy agents:  Kyprolis and Faspro      To help prevent nausea and vomiting after your treatment, we encourage you to take your nausea medication as directed.  BELOW ARE SYMPTOMS THAT SHOULD BE REPORTED IMMEDIATELY: *FEVER GREATER THAN 100.4 F (38 C) OR HIGHER *CHILLS OR SWEATING *NAUSEA AND VOMITING THAT IS NOT CONTROLLED WITH YOUR NAUSEA MEDICATION *UNUSUAL SHORTNESS OF BREATH *UNUSUAL BRUISING OR BLEEDING *URINARY PROBLEMS (pain or burning when urinating, or frequent urination) *BOWEL PROBLEMS (unusual diarrhea, constipation, pain near the anus) TENDERNESS IN MOUTH AND THROAT WITH OR WITHOUT PRESENCE OF ULCERS (sore throat, sores in mouth, or a toothache) UNUSUAL RASH, SWELLING OR PAIN  UNUSUAL VAGINAL DISCHARGE OR ITCHING   Items with * indicate a potential emergency and should be followed up as soon as possible or go to the Emergency Department if any problems should occur.  Please show the CHEMOTHERAPY ALERT CARD or IMMUNOTHERAPY ALERT CARD at  check-in to the Emergency Department and triage nurse. Should you have questions after your visit or need to cancel or reschedule your appointment, please contact [facility]  [phone] and follow the prompts.  Office hours are 8:00 a.m. to 4:30 p.m. Monday - Friday. Please note that voicemails left after 4:00 p.m. may not be returned until the following business day.  We are closed weekends and major holidays. You have access to a nurse at all times for urgent questions. Please call the main number to the clinic [phone] and follow the prompts.  For any non-urgent questions, you may also contact your provider using MyChart. We now offer e-Visits for anyone 7 and older to request care online for non-urgent symptoms. For details visit [email or web address].   Also download the MyChart app! Go to the app store, search "MyChart", open the app, select [facility], and log in with your MyChart username and password.  Masks are optional in the cancer centers. If you would like for your care team to wear a mask while they are taking care of you, please let them know. You may have one support person who is at least 73 years old accompany you for your appointments.

## 2022-10-15 ENCOUNTER — Other Ambulatory Visit: Payer: Self-pay

## 2022-10-15 LAB — KAPPA/LAMBDA LIGHT CHAINS
Kappa free light chain: 171.8 mg/L — ABNORMAL HIGH (ref 3.3–19.4)
Kappa, lambda light chain ratio: 21.48 — ABNORMAL HIGH (ref 0.26–1.65)
Lambda free light chains: 8 mg/L (ref 5.7–26.3)

## 2022-10-16 LAB — IGG, IGA, IGM
IgA: 28 mg/dL — ABNORMAL LOW (ref 64–422)
IgG (Immunoglobin G), Serum: 402 mg/dL — ABNORMAL LOW (ref 586–1602)
IgM (Immunoglobulin M), Srm: 26 mg/dL (ref 26–217)

## 2022-10-20 LAB — PROTEIN ELECTROPHORESIS, SERUM, WITH REFLEX
A/G Ratio: 1.4 (ref 0.7–1.7)
Albumin ELP: 3.5 g/dL (ref 2.9–4.4)
Alpha-1-Globulin: 0.2 g/dL (ref 0.0–0.4)
Alpha-2-Globulin: 0.8 g/dL (ref 0.4–1.0)
Beta Globulin: 1.1 g/dL (ref 0.7–1.3)
Gamma Globulin: 0.4 g/dL (ref 0.4–1.8)
Globulin, Total: 2.5 g/dL (ref 2.2–3.9)
Total Protein ELP: 6 g/dL (ref 6.0–8.5)

## 2022-10-21 ENCOUNTER — Other Ambulatory Visit: Payer: Self-pay

## 2022-10-22 ENCOUNTER — Inpatient Hospital Stay: Payer: Medicare Other

## 2022-10-22 VITALS — BP 145/88 | HR 83 | Resp 17

## 2022-10-22 DIAGNOSIS — C9001 Multiple myeloma in remission: Secondary | ICD-10-CM

## 2022-10-22 DIAGNOSIS — C50911 Malignant neoplasm of unspecified site of right female breast: Secondary | ICD-10-CM

## 2022-10-22 DIAGNOSIS — Z5111 Encounter for antineoplastic chemotherapy: Secondary | ICD-10-CM | POA: Diagnosis not present

## 2022-10-22 LAB — CBC WITH DIFFERENTIAL (CANCER CENTER ONLY)
Abs Immature Granulocytes: 0.04 10*3/uL (ref 0.00–0.07)
Basophils Absolute: 0 10*3/uL (ref 0.0–0.1)
Basophils Relative: 0 %
Eosinophils Absolute: 0.1 10*3/uL (ref 0.0–0.5)
Eosinophils Relative: 2 %
HCT: 39.5 % (ref 36.0–46.0)
Hemoglobin: 12.8 g/dL (ref 12.0–15.0)
Immature Granulocytes: 1 %
Lymphocytes Relative: 53 %
Lymphs Abs: 4.4 10*3/uL — ABNORMAL HIGH (ref 0.7–4.0)
MCH: 32.3 pg (ref 26.0–34.0)
MCHC: 32.4 g/dL (ref 30.0–36.0)
MCV: 99.7 fL (ref 80.0–100.0)
Monocytes Absolute: 0.5 10*3/uL (ref 0.1–1.0)
Monocytes Relative: 6 %
Neutro Abs: 3.1 10*3/uL (ref 1.7–7.7)
Neutrophils Relative %: 38 %
Platelet Count: 170 10*3/uL (ref 150–400)
RBC: 3.96 MIL/uL (ref 3.87–5.11)
RDW: 14.6 % (ref 11.5–15.5)
WBC Count: 8.2 10*3/uL (ref 4.0–10.5)
nRBC: 0.2 % (ref 0.0–0.2)

## 2022-10-22 LAB — CMP (CANCER CENTER ONLY)
ALT: 10 U/L (ref 0–44)
AST: 13 U/L — ABNORMAL LOW (ref 15–41)
Albumin: 4.2 g/dL (ref 3.5–5.0)
Alkaline Phosphatase: 54 U/L (ref 38–126)
Anion gap: 10 (ref 5–15)
BUN: 23 mg/dL (ref 8–23)
CO2: 26 mmol/L (ref 22–32)
Calcium: 9.4 mg/dL (ref 8.9–10.3)
Chloride: 104 mmol/L (ref 98–111)
Creatinine: 1.2 mg/dL — ABNORMAL HIGH (ref 0.44–1.00)
GFR, Estimated: 48 mL/min — ABNORMAL LOW (ref >60)
Glucose, Bld: 102 mg/dL — ABNORMAL HIGH (ref 70–99)
Potassium: 3.7 mmol/L (ref 3.5–5.1)
Sodium: 140 mmol/L (ref 135–145)
Total Bilirubin: 0.7 mg/dL (ref 0.3–1.2)
Total Protein: 5.9 g/dL — ABNORMAL LOW (ref 6.5–8.1)

## 2022-10-22 MED ORDER — ACETAMINOPHEN 325 MG PO TABS: 650.0000 mg | ORAL_TABLET | Freq: Once | ORAL | Status: DC

## 2022-10-22 MED ORDER — DEXAMETHASONE 4 MG PO TABS: 12.0000 mg | ORAL_TABLET | Freq: Once | ORAL | Status: DC

## 2022-10-22 MED ORDER — SODIUM CHLORIDE 0.9% FLUSH
10.0000 mL | INTRAVENOUS | Status: DC | PRN
  Administered 2022-10-22: 10 mL

## 2022-10-22 MED ORDER — DARATUMUMAB-HYALURONIDASE-FIHJ 1800-30000 MG-UT/15ML ~~LOC~~ SOLN
1800.0000 mg | Freq: Once | SUBCUTANEOUS | Status: AC
  Administered 2022-10-22: 1800 mg via SUBCUTANEOUS
  Filled 2022-10-22: qty 15

## 2022-10-22 MED ORDER — SODIUM CHLORIDE 0.9 % IV SOLN: Freq: Once | INTRAVENOUS | Status: AC

## 2022-10-22 MED ORDER — DIPHENHYDRAMINE HCL 25 MG PO CAPS: 50.0000 mg | ORAL_CAPSULE | Freq: Once | ORAL | Status: DC

## 2022-10-22 MED ORDER — HEPARIN SOD (PORK) LOCK FLUSH 100 UNIT/ML IV SOLN: 500.0000 [IU] | Freq: Once | INTRAVENOUS | Status: DC | PRN

## 2022-10-22 MED ORDER — DEXTROSE 5 % IV SOLN
70.0000 mg/m2 | Freq: Once | INTRAVENOUS | Status: AC
  Administered 2022-10-22: 140 mg via INTRAVENOUS
  Filled 2022-10-22: qty 60

## 2022-10-22 MED ORDER — MONTELUKAST SODIUM 10 MG PO TABS
10.0000 mg | ORAL_TABLET | Freq: Once | ORAL | Status: DC
  Filled 2022-10-22: qty 1

## 2022-10-22 NOTE — Patient Instructions (Signed)
[facility]  Discharge Instructions: Thank you for choosing [facility] to provide your oncology and hematology care.   If you have a lab appointment with the [facility], please go directly to the [facility] and check in at the registration area.  Wear comfortable clothing and clothing appropriate for easy access to any Portacath or PICC line.   We strive to give you quality time with your provider. You may need to reschedule your appointment if you arrive late (15 or more minutes).  Arriving late affects you and other patients whose appointments are after yours.  Also, if you miss three or more appointments without notifying the office, you may be dismissed from the clinic at the provider's discretion.      For prescription refill requests, have your pharmacy contact our office and allow 72 hours for refills to be completed.    Today you received the following chemotherapy and/or immunotherapy agents:  Kyprolis and Faspro      To help prevent nausea and vomiting after your treatment, we encourage you to take your nausea medication as directed.  BELOW ARE SYMPTOMS THAT SHOULD BE REPORTED IMMEDIATELY: *FEVER GREATER THAN 100.4 F (38 C) OR HIGHER *CHILLS OR SWEATING *NAUSEA AND VOMITING THAT IS NOT CONTROLLED WITH YOUR NAUSEA MEDICATION *UNUSUAL SHORTNESS OF BREATH *UNUSUAL BRUISING OR BLEEDING *URINARY PROBLEMS (pain or burning when urinating, or frequent urination) *BOWEL PROBLEMS (unusual diarrhea, constipation, pain near the anus) TENDERNESS IN MOUTH AND THROAT WITH OR WITHOUT PRESENCE OF ULCERS (sore throat, sores in mouth, or a toothache) UNUSUAL RASH, SWELLING OR PAIN  UNUSUAL VAGINAL DISCHARGE OR ITCHING   Items with * indicate a potential emergency and should be followed up as soon as possible or go to the Emergency Department if any problems should occur.  Please show the CHEMOTHERAPY ALERT CARD or IMMUNOTHERAPY ALERT CARD at  check-in to the Emergency Department and triage nurse. Should you have questions after your visit or need to cancel or reschedule your appointment, please contact [facility]  [phone] and follow the prompts.  Office hours are 8:00 a.m. to 4:30 p.m. Monday - Friday. Please note that voicemails left after 4:00 p.m. may not be returned until the following business day.  We are closed weekends and major holidays. You have access to a nurse at all times for urgent questions. Please call the main number to the clinic [phone] and follow the prompts.  For any non-urgent questions, you may also contact your provider using MyChart. We now offer e-Visits for anyone 34 and older to request care online for non-urgent symptoms. For details visit [email or web address].   Also download the MyChart app! Go to the app store, search "MyChart", open the app, select , and log in with your MyChart username and password.  Masks are optional in the cancer centers. If you would like for your care team to wear a mask while they are taking care of you, please let them know. You may have one support person who is at least 73 years old accompany you for your appointments.

## 2022-10-25 ENCOUNTER — Other Ambulatory Visit: Payer: Self-pay

## 2022-10-27 ENCOUNTER — Other Ambulatory Visit: Payer: Self-pay

## 2022-10-29 ENCOUNTER — Other Ambulatory Visit: Payer: Self-pay | Admitting: *Deleted

## 2022-10-29 ENCOUNTER — Inpatient Hospital Stay: Payer: Medicare Other

## 2022-10-29 ENCOUNTER — Inpatient Hospital Stay: Payer: Medicare Other | Attending: Hematology & Oncology

## 2022-10-29 VITALS — BP 157/67 | HR 89 | Temp 98.0°F | Resp 20

## 2022-10-29 DIAGNOSIS — Z5112 Encounter for antineoplastic immunotherapy: Secondary | ICD-10-CM | POA: Insufficient documentation

## 2022-10-29 DIAGNOSIS — C9001 Multiple myeloma in remission: Secondary | ICD-10-CM

## 2022-10-29 DIAGNOSIS — C50911 Malignant neoplasm of unspecified site of right female breast: Secondary | ICD-10-CM

## 2022-10-29 DIAGNOSIS — Z853 Personal history of malignant neoplasm of breast: Secondary | ICD-10-CM | POA: Diagnosis not present

## 2022-10-29 DIAGNOSIS — Z5111 Encounter for antineoplastic chemotherapy: Secondary | ICD-10-CM | POA: Insufficient documentation

## 2022-10-29 DIAGNOSIS — C9000 Multiple myeloma not having achieved remission: Secondary | ICD-10-CM | POA: Insufficient documentation

## 2022-10-29 LAB — CBC WITH DIFFERENTIAL (CANCER CENTER ONLY)
Abs Immature Granulocytes: 0.06 10*3/uL (ref 0.00–0.07)
Basophils Absolute: 0 10*3/uL (ref 0.0–0.1)
Basophils Relative: 0 %
Eosinophils Absolute: 0.1 10*3/uL (ref 0.0–0.5)
Eosinophils Relative: 1 %
HCT: 41.4 % (ref 36.0–46.0)
Hemoglobin: 13.6 g/dL (ref 12.0–15.0)
Immature Granulocytes: 1 %
Lymphocytes Relative: 33 %
Lymphs Abs: 3.2 10*3/uL (ref 0.7–4.0)
MCH: 32.5 pg (ref 26.0–34.0)
MCHC: 32.9 g/dL (ref 30.0–36.0)
MCV: 99 fL (ref 80.0–100.0)
Monocytes Absolute: 0.7 10*3/uL (ref 0.1–1.0)
Monocytes Relative: 8 %
Neutro Abs: 5.6 10*3/uL (ref 1.7–7.7)
Neutrophils Relative %: 57 %
Platelet Count: 146 10*3/uL — ABNORMAL LOW (ref 150–400)
RBC: 4.18 MIL/uL (ref 3.87–5.11)
RDW: 14.6 % (ref 11.5–15.5)
WBC Count: 9.8 10*3/uL (ref 4.0–10.5)
nRBC: 0 % (ref 0.0–0.2)

## 2022-10-29 LAB — CMP (CANCER CENTER ONLY)
ALT: 22 U/L (ref 0–44)
AST: 15 U/L (ref 15–41)
Albumin: 4.3 g/dL (ref 3.5–5.0)
Alkaline Phosphatase: 72 U/L (ref 38–126)
Anion gap: 12 (ref 5–15)
BUN: 24 mg/dL — ABNORMAL HIGH (ref 8–23)
CO2: 25 mmol/L (ref 22–32)
Calcium: 9.2 mg/dL (ref 8.9–10.3)
Chloride: 104 mmol/L (ref 98–111)
Creatinine: 1.16 mg/dL — ABNORMAL HIGH (ref 0.44–1.00)
GFR, Estimated: 50 mL/min — ABNORMAL LOW (ref >60)
Glucose, Bld: 107 mg/dL — ABNORMAL HIGH (ref 70–99)
Potassium: 4 mmol/L (ref 3.5–5.1)
Sodium: 141 mmol/L (ref 135–145)
Total Bilirubin: 0.8 mg/dL (ref 0.3–1.2)
Total Protein: 6.6 g/dL (ref 6.5–8.1)

## 2022-10-29 MED ORDER — ACETAMINOPHEN 325 MG PO TABS: 650.0000 mg | ORAL_TABLET | Freq: Once | ORAL | Status: DC

## 2022-10-29 MED ORDER — SODIUM CHLORIDE 0.9 % IV SOLN: Freq: Once | INTRAVENOUS | Status: AC

## 2022-10-29 MED ORDER — DEXAMETHASONE 4 MG PO TABS: 12.0000 mg | ORAL_TABLET | Freq: Once | ORAL | Status: DC

## 2022-10-29 MED ORDER — SODIUM CHLORIDE 0.9% FLUSH
10.0000 mL | INTRAVENOUS | Status: DC | PRN
  Administered 2022-10-29: 10 mL

## 2022-10-29 MED ORDER — DEXAMETHASONE 6 MG PO TABS: 12.0000 mg | ORAL_TABLET | Freq: Every day | ORAL | 1 refills | Status: DC

## 2022-10-29 MED ORDER — DEXTROSE 5 % IV SOLN
70.0000 mg/m2 | Freq: Once | INTRAVENOUS | Status: AC
  Administered 2022-10-29: 140 mg via INTRAVENOUS
  Filled 2022-10-29: qty 60

## 2022-10-29 MED ORDER — MONTELUKAST SODIUM 10 MG PO TABS
10.0000 mg | ORAL_TABLET | Freq: Once | ORAL | Status: DC
  Filled 2022-10-29: qty 1

## 2022-10-29 MED ORDER — DIPHENHYDRAMINE HCL 25 MG PO CAPS: 50.0000 mg | ORAL_CAPSULE | Freq: Once | ORAL | Status: DC

## 2022-10-29 MED ORDER — DARATUMUMAB-HYALURONIDASE-FIHJ 1800-30000 MG-UT/15ML ~~LOC~~ SOLN
1800.0000 mg | Freq: Once | SUBCUTANEOUS | Status: AC
  Administered 2022-10-29: 1800 mg via SUBCUTANEOUS
  Filled 2022-10-29: qty 15

## 2022-10-29 NOTE — Patient Instructions (Signed)
[facility]  Discharge Instructions: Thank you for choosing [facility] to provide your oncology and hematology care.   If you have a lab appointment with the [facility], please go directly to the [facility] and check in at the registration area.  Wear comfortable clothing and clothing appropriate for easy access to any Portacath or PICC line.   We strive to give you quality time with your provider. You may need to reschedule your appointment if you arrive late (15 or more minutes).  Arriving late affects you and other patients whose appointments are after yours.  Also, if you miss three or more appointments without notifying the office, you may be dismissed from the clinic at the provider's discretion.      For prescription refill requests, have your pharmacy contact our office and allow 72 hours for refills to be completed.    Today you received the following chemotherapy and/or immunotherapy agents:  Kyprolis and Faspro      To help prevent nausea and vomiting after your treatment, we encourage you to take your nausea medication as directed.  BELOW ARE SYMPTOMS THAT SHOULD BE REPORTED IMMEDIATELY: *FEVER GREATER THAN 100.4 F (38 C) OR HIGHER *CHILLS OR SWEATING *NAUSEA AND VOMITING THAT IS NOT CONTROLLED WITH YOUR NAUSEA MEDICATION *UNUSUAL SHORTNESS OF BREATH *UNUSUAL BRUISING OR BLEEDING *URINARY PROBLEMS (pain or burning when urinating, or frequent urination) *BOWEL PROBLEMS (unusual diarrhea, constipation, pain near the anus) TENDERNESS IN MOUTH AND THROAT WITH OR WITHOUT PRESENCE OF ULCERS (sore throat, sores in mouth, or a toothache) UNUSUAL RASH, SWELLING OR PAIN  UNUSUAL VAGINAL DISCHARGE OR ITCHING   Items with * indicate a potential emergency and should be followed up as soon as possible or go to the Emergency Department if any problems should occur.  Please show the CHEMOTHERAPY ALERT CARD or IMMUNOTHERAPY ALERT CARD at  check-in to the Emergency Department and triage nurse. Should you have questions after your visit or need to cancel or reschedule your appointment, please contact [facility]  [phone] and follow the prompts.  Office hours are 8:00 a.m. to 4:30 p.m. Monday - Friday. Please note that voicemails left after 4:00 p.m. may not be returned until the following business day.  We are closed weekends and major holidays. You have access to a nurse at all times for urgent questions. Please call the main number to the clinic [phone] and follow the prompts.  For any non-urgent questions, you may also contact your provider using MyChart. We now offer e-Visits for anyone 22 and older to request care online for non-urgent symptoms. For details visit [email or web address].   Also download the MyChart app! Go to the app store, search "MyChart", open the app, select [facility], and log in with your MyChart username and password.  Masks are optional in the cancer centers. If you would like for your care team to wear a mask while they are taking care of you, please let them know. You may have one support person who is at least 73 years old accompany you for your appointments.

## 2022-11-04 ENCOUNTER — Inpatient Hospital Stay: Payer: Medicare Other

## 2022-11-11 ENCOUNTER — Other Ambulatory Visit: Payer: Self-pay

## 2022-11-12 ENCOUNTER — Inpatient Hospital Stay: Payer: Medicare Other

## 2022-11-12 ENCOUNTER — Inpatient Hospital Stay: Payer: Medicare Other | Admitting: Hematology & Oncology

## 2022-11-14 NOTE — Progress Notes (Signed)
11/12/22 orders moved forward to 11/25/22 per Dr. Antonieta Pert instructions.

## 2022-11-17 ENCOUNTER — Other Ambulatory Visit: Payer: Self-pay

## 2022-11-25 ENCOUNTER — Encounter: Payer: Self-pay | Admitting: Hematology & Oncology

## 2022-11-25 ENCOUNTER — Other Ambulatory Visit: Payer: Self-pay

## 2022-11-25 ENCOUNTER — Inpatient Hospital Stay (HOSPITAL_BASED_OUTPATIENT_CLINIC_OR_DEPARTMENT_OTHER): Payer: Medicare Other | Admitting: Hematology & Oncology

## 2022-11-25 ENCOUNTER — Inpatient Hospital Stay: Payer: Medicare Other | Attending: Hematology & Oncology

## 2022-11-25 ENCOUNTER — Inpatient Hospital Stay: Payer: Medicare Other

## 2022-11-25 VITALS — BP 145/69 | HR 91 | Temp 98.0°F | Resp 18 | Ht 58.5 in | Wt 225.0 lb

## 2022-11-25 DIAGNOSIS — C50911 Malignant neoplasm of unspecified site of right female breast: Secondary | ICD-10-CM

## 2022-11-25 DIAGNOSIS — Z5111 Encounter for antineoplastic chemotherapy: Secondary | ICD-10-CM | POA: Insufficient documentation

## 2022-11-25 DIAGNOSIS — C9001 Multiple myeloma in remission: Secondary | ICD-10-CM | POA: Diagnosis not present

## 2022-11-25 DIAGNOSIS — Z5112 Encounter for antineoplastic immunotherapy: Secondary | ICD-10-CM | POA: Diagnosis not present

## 2022-11-25 DIAGNOSIS — Z86711 Personal history of pulmonary embolism: Secondary | ICD-10-CM | POA: Insufficient documentation

## 2022-11-25 DIAGNOSIS — Z853 Personal history of malignant neoplasm of breast: Secondary | ICD-10-CM | POA: Insufficient documentation

## 2022-11-25 DIAGNOSIS — Z9484 Stem cells transplant status: Secondary | ICD-10-CM | POA: Diagnosis not present

## 2022-11-25 DIAGNOSIS — C9000 Multiple myeloma not having achieved remission: Secondary | ICD-10-CM | POA: Insufficient documentation

## 2022-11-25 DIAGNOSIS — Z7901 Long term (current) use of anticoagulants: Secondary | ICD-10-CM | POA: Diagnosis not present

## 2022-11-25 DIAGNOSIS — Z86718 Personal history of other venous thrombosis and embolism: Secondary | ICD-10-CM | POA: Diagnosis not present

## 2022-11-25 DIAGNOSIS — Z95828 Presence of other vascular implants and grafts: Secondary | ICD-10-CM

## 2022-11-25 LAB — CMP (CANCER CENTER ONLY)
ALT: 14 U/L (ref 0–44)
AST: 14 U/L — ABNORMAL LOW (ref 15–41)
Albumin: 4.1 g/dL (ref 3.5–5.0)
Alkaline Phosphatase: 64 U/L (ref 38–126)
Anion gap: 13 (ref 5–15)
BUN: 24 mg/dL — ABNORMAL HIGH (ref 8–23)
CO2: 24 mmol/L (ref 22–32)
Calcium: 8.8 mg/dL — ABNORMAL LOW (ref 8.9–10.3)
Chloride: 105 mmol/L (ref 98–111)
Creatinine: 1.43 mg/dL — ABNORMAL HIGH (ref 0.44–1.00)
GFR, Estimated: 39 mL/min — ABNORMAL LOW (ref >60)
Glucose, Bld: 111 mg/dL — ABNORMAL HIGH (ref 70–99)
Potassium: 3.6 mmol/L (ref 3.5–5.1)
Sodium: 142 mmol/L (ref 135–145)
Total Bilirubin: 0.7 mg/dL (ref 0.3–1.2)
Total Protein: 6.1 g/dL — ABNORMAL LOW (ref 6.5–8.1)

## 2022-11-25 LAB — CBC WITH DIFFERENTIAL (CANCER CENTER ONLY)
Abs Immature Granulocytes: 0.02 10*3/uL (ref 0.00–0.07)
Basophils Absolute: 0 10*3/uL (ref 0.0–0.1)
Basophils Relative: 1 %
Eosinophils Absolute: 0.1 10*3/uL (ref 0.0–0.5)
Eosinophils Relative: 2 %
HCT: 39.3 % (ref 36.0–46.0)
Hemoglobin: 13 g/dL (ref 12.0–15.0)
Immature Granulocytes: 0 %
Lymphocytes Relative: 39 %
Lymphs Abs: 2.5 10*3/uL (ref 0.7–4.0)
MCH: 33.1 pg (ref 26.0–34.0)
MCHC: 33.1 g/dL (ref 30.0–36.0)
MCV: 100 fL (ref 80.0–100.0)
Monocytes Absolute: 0.4 10*3/uL (ref 0.1–1.0)
Monocytes Relative: 7 %
Neutro Abs: 3.2 10*3/uL (ref 1.7–7.7)
Neutrophils Relative %: 51 %
Platelet Count: 155 10*3/uL (ref 150–400)
RBC: 3.93 MIL/uL (ref 3.87–5.11)
RDW: 15.6 % — ABNORMAL HIGH (ref 11.5–15.5)
WBC Count: 6.3 10*3/uL (ref 4.0–10.5)
nRBC: 0 % (ref 0.0–0.2)

## 2022-11-25 LAB — LACTATE DEHYDROGENASE: LDH: 146 U/L (ref 98–192)

## 2022-11-25 MED ORDER — SODIUM CHLORIDE 0.9% FLUSH: 10.0000 mL | INTRAVENOUS | Status: DC | PRN

## 2022-11-25 MED ORDER — DEXTROSE 5 % IV SOLN
70.0000 mg/m2 | Freq: Once | INTRAVENOUS | Status: AC
  Administered 2022-11-25: 140 mg via INTRAVENOUS
  Filled 2022-11-25: qty 60

## 2022-11-25 MED ORDER — DARATUMUMAB-HYALURONIDASE-FIHJ 1800-30000 MG-UT/15ML ~~LOC~~ SOLN
1800.0000 mg | Freq: Once | SUBCUTANEOUS | Status: AC
  Administered 2022-11-25: 1800 mg via SUBCUTANEOUS
  Filled 2022-11-25: qty 15

## 2022-11-25 MED ORDER — DIPHENHYDRAMINE HCL 25 MG PO CAPS: 50.0000 mg | ORAL_CAPSULE | Freq: Once | ORAL | Status: DC

## 2022-11-25 MED ORDER — SODIUM CHLORIDE 0.9% FLUSH
10.0000 mL | INTRAVENOUS | Status: DC | PRN
  Administered 2022-11-25: 10 mL

## 2022-11-25 MED ORDER — ACETAMINOPHEN 325 MG PO TABS: 650.0000 mg | ORAL_TABLET | Freq: Once | ORAL | Status: DC

## 2022-11-25 MED ORDER — DEXAMETHASONE 4 MG PO TABS: 12.0000 mg | ORAL_TABLET | Freq: Once | ORAL | Status: DC

## 2022-11-25 MED ORDER — SODIUM CHLORIDE 0.9 % IV SOLN: Freq: Once | INTRAVENOUS | Status: AC

## 2022-11-25 NOTE — Progress Notes (Signed)
Hematology and Oncology Follow Up Visit  Marcia [name] [id number] March 01, 1951 72 y.o. 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  She did have a nice Christmas and New Year's.  Unfortunate, she developed of a sinus infection before all this.  We have not been able to treat her for a couple weeks.  She is eating well.  She is having no problems with nausea or vomiting.  She is having no problems with bowels or bladder.  She has had no fever.  She has had no rashes.  There is been no leg swelling.  Her initial Kappa light chain was 17.2 mg/dL.  Her IgG level was 402 mg/dL.  She has had no mouth sores.  There is been no headache.  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  She is doing well on the Eliquis..  She is is still doing with the history of breast cancer.  So far, we have not found any evidence of recurrent disease.  Currently, I would say that her performance status is probably ECOG 1.   Medications:  Allergies as of 11/25/2022       Reactions   Heparin Other (See Comments)   HIT   Sulfamethoxazole-trimethoprim Other (See Comments), Rash   ? Renal failure.   Zofran [ondansetron] Shortness Of Breath   "felt like throat was  closing"   Clarithromycin Diarrhea, Nausea And Vomiting   Latex Rash   Macrodantin [nitrofurantoin] Rash        Medication List        Accurate as of November 25, 2022  8:19 AM. If you have any questions, ask your nurse or doctor.          acetaminophen 325 MG tablet Commonly known as: TYLENOL Take 650 mg by mouth daily as needed for moderate pain or headache.   dexamethasone 6 MG tablet Commonly known as: DECADRON Take 2 tablets (12 mg total) by mouth daily. Take 30 minutes prior to treatment.   Eliquis 2.5 MG Tabs tablet Generic drug: apixaban TAKE 1 TABLET BY MOUTH TWICE A DAY   famciclovir 250 MG tablet Commonly known as: FAMVIR TAKE 1 TABLET BY MOUTH EVERY DAY   feeding supplement Liqd Take 237 mLs by mouth 3 (three) times daily between meals. What changed: when to take this   folic acid 1 MG tablet Commonly known as: FOLVITE TAKE 2 TABLETS (2 MG TOTAL) BY MOUTH DAILY.   LORazepam 0.5 MG tablet Commonly known as: ATIVAN Take 1 tablet by mouth daily as needed.   losartan 25 MG tablet Commonly known as: COZAAR Take 25 mg by mouth daily.   montelukast 10 MG tablet Commonly known as: Singulair Take 1 tablet (10 mg total) by mouth at bedtime. Start the Singulair on 03/14/2022  NON FORMULARY Nerve Savior 2 capsules daily   prochlorperazine 10 MG tablet Commonly known as: COMPAZINE Take 1 tablet (10 mg total) by mouth every 6 (six) hours as needed for nausea or vomiting.   traMADol 50 MG tablet Commonly known as: ULTRAM Take 1 tablet by mouth every 12 (twelve) hours as needed.   VITAMIN E PO daily.        Allergies:  Allergies  Allergen Reactions   Heparin Other (See Comments)    HIT   Sulfamethoxazole-Trimethoprim Other (See Comments) and Rash    ? Renal failure.   Zofran [Ondansetron] Shortness Of Breath    "felt like throat was closing"   Clarithromycin Diarrhea and Nausea And Vomiting   Latex Rash   Macrodantin [Nitrofurantoin] Rash     Past Medical History, Surgical history, Social history, and Family History were reviewed and updated.  Review of Systems: Review of Systems  Constitutional: Negative.   HENT: Negative.    Eyes: Negative.   Respiratory: Negative.    Cardiovascular: Negative.   Gastrointestinal: Negative.   Genitourinary: Negative.   Musculoskeletal: Negative.   Skin: Negative.   Neurological: Negative.   Endo/Heme/Allergies: Negative.   Psychiatric/Behavioral: Negative.        Physical Exam:  height is 4' 10.5" (1.486 m) and weight is 225 lb (102.1 kg). Her oral temperature is 98 F (36.7 C). Her blood pressure is 145/69 (abnormal) and her pulse is 91. Her respiration is 18 and oxygen saturation is 98%.   Wt Readings from Last 3 Encounters:  11/25/22 225 lb (102.1 kg)  10/14/22 229 lb (103.9 kg)  10/02/22 (P) 227 lb 12.8 oz (103.3 kg)    Physical Exam Vitals reviewed.  HENT:     Head: Normocephalic and atraumatic.  Eyes:     Pupils: Pupils are equal, round, and reactive to light.  Cardiovascular:     Rate and Rhythm: Normal rate and regular rhythm.     Heart sounds: Normal heart sounds.  Pulmonary:     Effort: Pulmonary effort is normal.     Breath sounds: Normal breath sounds.  Abdominal:     General: Bowel sounds are normal.     Palpations: Abdomen is soft.  Musculoskeletal:        General: No tenderness or deformity. Normal range of motion.     Cervical back: Normal range of motion.  Lymphadenopathy:     Cervical: No cervical adenopathy.  Skin:    General: Skin is warm and dry.     Findings: No erythema or rash.  Neurological:     Mental Status: She is alert and oriented to person, place, and time.  Psychiatric:        Behavior: Behavior normal.        Thought Content: Thought content normal.        Judgment: Judgment normal.      Lab Results  Component Value Date   WBC 9.8 10/29/2022   HGB 13.6 10/29/2022   HCT 41.4 10/29/2022   MCV 99.0 10/29/2022   PLT  146 (L) 10/29/2022   Lab Results  Component Value Date   FERRITIN 820 (H) 05/17/2018   IRON 272 (H) 05/17/2018   TIBC 295 05/17/2018   UIBC 23 05/17/2018   IRONPCTSAT 92 (H) 05/17/2018   Lab Results  Component Value Date   RETICCTPCT 1.3 03/27/2011   RBC 4.18 10/29/2022   RETICCTABS 56.4 03/27/2011   Lab Results  Component Value Date   KPAFRELGTCHN 171.8 (H) 10/14/2022  LAMBDASER 8.0 10/14/2022   KAPLAMBRATIO 21.48 (H) 10/14/2022   Lab Results  Component Value Date   IGGSERUM 402 (L) 10/14/2022   IGA 28 (L) 10/14/2022   IGMSERUM 26 10/14/2022   Lab Results  Component Value Date   TOTALPROTELP 6.0 10/14/2022   ALBUMINELP 3.5 10/14/2022   A1GS 0.2 10/14/2022   A2GS 0.8 10/14/2022   BETS 1.1 10/14/2022   BETA2SER 0.4 07/02/2015   GAMS 0.4 10/14/2022   MSPIKE Not Observed 10/14/2022   SPEI Comment 10/02/2022     Chemistry      Component Value Date/Time   NA 141 10/29/2022 0902   NA 141 07/06/2017 0816   NA 141 01/05/2017 0956   K 4.0 10/29/2022 0902   K 3.6 07/06/2017 0816   K 4.3 01/05/2017 0956   CL 104 10/29/2022 0902   CL 101 07/06/2017 0816   CO2 25 10/29/2022 0902   CO2 28 07/06/2017 0816   CO2 23 01/05/2017 0956   BUN 24 (H) 10/29/2022 0902   BUN 28 (H) 07/06/2017 0816   BUN 22.4 01/05/2017 0956   CREATININE 1.16 (H) 10/29/2022 0902   CREATININE 1.6 (H) 07/06/2017 0816   CREATININE 1.4 (H) 01/05/2017 0956      Component Value Date/Time   CALCIUM 9.2 10/29/2022 0902   CALCIUM 9.1 07/06/2017 0816   CALCIUM 9.3 01/05/2017 0956   ALKPHOS 72 10/29/2022 0902   ALKPHOS 58 07/06/2017 0816   ALKPHOS 52 01/05/2017 0956   AST 15 10/29/2022 0902   AST 14 01/05/2017 0956   ALT 22 10/29/2022 0902   ALT 58 (H) 07/06/2017 0816   ALT 13 01/05/2017 0956   BILITOT 0.8 10/29/2022 0902   BILITOT 0.56 01/05/2017 0956       Impression and Plan: Ms. Janak is a very pleasant 72 yo caucasian female with kappa light chain myeloma that has recurred again.     She has a for cycle of treatment with the Kyprolis.  Hopefully, we will see that her Kappa light chain is coming down.  We will go ahead with treatment for this cycle.  This will be her second cycle of treatment.  She will get the Kyprolis for 3 weeks on and 1 week off.  She will continue on the Faspro.  Will plan to get her back in another month.     [name], MD 1/2/20248:19 AM

## 2022-11-25 NOTE — Patient Instructions (Signed)
[facility]  Discharge Instructions: Thank you for choosing [facility] to provide your oncology and hematology care.   If you have a lab appointment with the [facility], please go directly to the [facility] and check in at the registration area.  Wear comfortable clothing and clothing appropriate for easy access to any Portacath or PICC line.   We strive to give you quality time with your provider. You may need to reschedule your appointment if you arrive late (15 or more minutes).  Arriving late affects you and other patients whose appointments are after yours.  Also, if you miss three or more appointments without notifying the office, you may be dismissed from the clinic at the provider's discretion.      For prescription refill requests, have your pharmacy contact our office and allow 72 hours for refills to be completed.    Today you received the following chemotherapy and/or immunotherapy agents Kyprolis, Darzalex      To help prevent nausea and vomiting after your treatment, we encourage you to take your nausea medication as directed.  BELOW ARE SYMPTOMS THAT SHOULD BE REPORTED IMMEDIATELY: *FEVER GREATER THAN 100.4 F (38 C) OR HIGHER *CHILLS OR SWEATING *NAUSEA AND VOMITING THAT IS NOT CONTROLLED WITH YOUR NAUSEA MEDICATION *UNUSUAL SHORTNESS OF BREATH *UNUSUAL BRUISING OR BLEEDING *URINARY PROBLEMS (pain or burning when urinating, or frequent urination) *BOWEL PROBLEMS (unusual diarrhea, constipation, pain near the anus) TENDERNESS IN MOUTH AND THROAT WITH OR WITHOUT PRESENCE OF ULCERS (sore throat, sores in mouth, or a toothache) UNUSUAL RASH, SWELLING OR PAIN  UNUSUAL VAGINAL DISCHARGE OR ITCHING   Items with * indicate a potential emergency and should be followed up as soon as possible or go to the Emergency Department if any problems should occur.  Please show the CHEMOTHERAPY ALERT CARD or IMMUNOTHERAPY ALERT CARD at check-in  to the Emergency Department and triage nurse. Should you have questions after your visit or need to cancel or reschedule your appointment, please contact [facility]  [phone] and follow the prompts.  Office hours are 8:00 a.m. to 4:30 p.m. Monday - Friday. Please note that voicemails left after 4:00 p.m. may not be returned until the following business day.  We are closed weekends and major holidays. You have access to a nurse at all times for urgent questions. Please call the main number to the clinic [phone] and follow the prompts.  For any non-urgent questions, you may also contact your provider using MyChart. We now offer e-Visits for anyone 25 and older to request care online for non-urgent symptoms. For details visit [email or web address].   Also download the MyChart app! Go to the app store, search "MyChart", open the app, select [facility], and log in with your MyChart username and password.

## 2022-11-25 NOTE — Patient Instructions (Signed)

## 2022-11-26 ENCOUNTER — Other Ambulatory Visit: Payer: Self-pay

## 2022-11-26 LAB — KAPPA/LAMBDA LIGHT CHAINS
Kappa free light chain: 142.9 mg/L — ABNORMAL HIGH (ref 3.3–19.4)
Kappa, lambda light chain ratio: 38.62 — ABNORMAL HIGH (ref 0.26–1.65)
Lambda free light chains: 3.7 mg/L — ABNORMAL LOW (ref 5.7–26.3)

## 2022-11-26 LAB — IGG, IGA, IGM
IgA: 15 mg/dL — ABNORMAL LOW (ref 64–422)
IgG (Immunoglobin G), Serum: 326 mg/dL — ABNORMAL LOW (ref 586–1602)
IgM (Immunoglobulin M), Srm: 14 mg/dL — ABNORMAL LOW (ref 26–217)

## 2022-11-27 ENCOUNTER — Other Ambulatory Visit: Payer: Self-pay

## 2022-11-27 ENCOUNTER — Telehealth: Payer: Self-pay

## 2022-11-27 LAB — PROTEIN ELECTROPHORESIS, SERUM, WITH REFLEX
A/G Ratio: 1.5 (ref 0.7–1.7)
Albumin ELP: 3.4 g/dL (ref 2.9–4.4)
Alpha-1-Globulin: 0.2 g/dL (ref 0.0–0.4)
Alpha-2-Globulin: 0.7 g/dL (ref 0.4–1.0)
Beta Globulin: 1.1 g/dL (ref 0.7–1.3)
Gamma Globulin: 0.3 g/dL — ABNORMAL LOW (ref 0.4–1.8)
Globulin, Total: 2.3 g/dL (ref 2.2–3.9)
Total Protein ELP: 5.7 g/dL — ABNORMAL LOW (ref 6.0–8.5)

## 2022-11-27 NOTE — Telephone Encounter (Signed)
-----   Message from [name], MD sent at 11/26/2022  5:28 PM EST ----- Call - the kappa light chain is down to 143!!!!  Great way to start the new year!  pete

## 2022-12-02 ENCOUNTER — Other Ambulatory Visit: Payer: Self-pay | Admitting: *Deleted

## 2022-12-02 ENCOUNTER — Inpatient Hospital Stay: Payer: Medicare Other

## 2022-12-02 ENCOUNTER — Encounter: Payer: Self-pay | Admitting: Hematology & Oncology

## 2022-12-02 ENCOUNTER — Other Ambulatory Visit (HOSPITAL_BASED_OUTPATIENT_CLINIC_OR_DEPARTMENT_OTHER): Payer: Self-pay

## 2022-12-02 VITALS — BP 131/60 | HR 90 | Resp 17

## 2022-12-02 DIAGNOSIS — C9001 Multiple myeloma in remission: Secondary | ICD-10-CM

## 2022-12-02 DIAGNOSIS — C50911 Malignant neoplasm of unspecified site of right female breast: Secondary | ICD-10-CM

## 2022-12-02 DIAGNOSIS — Z5111 Encounter for antineoplastic chemotherapy: Secondary | ICD-10-CM | POA: Diagnosis not present

## 2022-12-02 DIAGNOSIS — C9000 Multiple myeloma not having achieved remission: Secondary | ICD-10-CM

## 2022-12-02 LAB — CBC WITH DIFFERENTIAL (CANCER CENTER ONLY)
Abs Immature Granulocytes: 0.03 10*3/uL (ref 0.00–0.07)
Basophils Absolute: 0 10*3/uL (ref 0.0–0.1)
Basophils Relative: 0 %
Eosinophils Absolute: 0.2 10*3/uL (ref 0.0–0.5)
Eosinophils Relative: 2 %
HCT: 39.7 % (ref 36.0–46.0)
Hemoglobin: 13.1 g/dL (ref 12.0–15.0)
Immature Granulocytes: 0 %
Lymphocytes Relative: 38 %
Lymphs Abs: 2.7 10*3/uL (ref 0.7–4.0)
MCH: 33.1 pg (ref 26.0–34.0)
MCHC: 33 g/dL (ref 30.0–36.0)
MCV: 100.3 fL — ABNORMAL HIGH (ref 80.0–100.0)
Monocytes Absolute: 0.7 10*3/uL (ref 0.1–1.0)
Monocytes Relative: 10 %
Neutro Abs: 3.6 10*3/uL (ref 1.7–7.7)
Neutrophils Relative %: 50 %
Platelet Count: 152 10*3/uL (ref 150–400)
RBC: 3.96 MIL/uL (ref 3.87–5.11)
RDW: 15.6 % — ABNORMAL HIGH (ref 11.5–15.5)
WBC Count: 7.2 10*3/uL (ref 4.0–10.5)
nRBC: 0 % (ref 0.0–0.2)

## 2022-12-02 LAB — CMP (CANCER CENTER ONLY)
ALT: 12 U/L (ref 0–44)
AST: 13 U/L — ABNORMAL LOW (ref 15–41)
Albumin: 4.1 g/dL (ref 3.5–5.0)
Alkaline Phosphatase: 65 U/L (ref 38–126)
Anion gap: 10 (ref 5–15)
BUN: 24 mg/dL — ABNORMAL HIGH (ref 8–23)
CO2: 26 mmol/L (ref 22–32)
Calcium: 9.3 mg/dL (ref 8.9–10.3)
Chloride: 104 mmol/L (ref 98–111)
Creatinine: 1.34 mg/dL — ABNORMAL HIGH (ref 0.44–1.00)
GFR, Estimated: 42 mL/min — ABNORMAL LOW (ref >60)
Glucose, Bld: 108 mg/dL — ABNORMAL HIGH (ref 70–99)
Potassium: 3.9 mmol/L (ref 3.5–5.1)
Sodium: 140 mmol/L (ref 135–145)
Total Bilirubin: 0.8 mg/dL (ref 0.3–1.2)
Total Protein: 6.3 g/dL — ABNORMAL LOW (ref 6.5–8.1)

## 2022-12-02 MED ORDER — SODIUM CHLORIDE 0.9 % IV SOLN: Freq: Once | INTRAVENOUS | Status: AC

## 2022-12-02 MED ORDER — DEXTROSE 5 % IV SOLN
70.0000 mg/m2 | Freq: Once | INTRAVENOUS | Status: AC
  Administered 2022-12-02: 140 mg via INTRAVENOUS
  Filled 2022-12-02: qty 60

## 2022-12-02 MED ORDER — DARATUMUMAB-HYALURONIDASE-FIHJ 1800-30000 MG-UT/15ML ~~LOC~~ SOLN
1800.0000 mg | Freq: Once | SUBCUTANEOUS | Status: AC
  Administered 2022-12-02: 1800 mg via SUBCUTANEOUS
  Filled 2022-12-02: qty 15

## 2022-12-02 MED ORDER — DIPHENHYDRAMINE HCL 25 MG PO CAPS
50.0000 mg | ORAL_CAPSULE | Freq: Once | ORAL | Status: DC
  Filled 2022-12-02: qty 2

## 2022-12-02 MED ORDER — SODIUM CHLORIDE 0.9 % IV SOLN: Freq: Once | INTRAVENOUS | Status: DC

## 2022-12-02 MED ORDER — SODIUM CHLORIDE 0.9% FLUSH
10.0000 mL | INTRAVENOUS | Status: DC | PRN
  Administered 2022-12-02 (×2): 10 mL

## 2022-12-02 MED ORDER — ACETAMINOPHEN 325 MG PO TABS
650.0000 mg | ORAL_TABLET | Freq: Once | ORAL | Status: DC
  Filled 2022-12-02: qty 2

## 2022-12-02 MED ORDER — METHYLPREDNISOLONE 4 MG PO TBPK
ORAL_TABLET | ORAL | 0 refills | Status: DC
  Filled 2022-12-02: qty 21, 6d supply, fill #0

## 2022-12-02 MED ORDER — MONTELUKAST SODIUM 10 MG PO TABS
10.0000 mg | ORAL_TABLET | Freq: Every day | ORAL | 3 refills | Status: DC
  Filled 2022-12-02: qty 30, 30d supply, fill #0
  Filled 2022-12-30: qty 30, 30d supply, fill #1
  Filled 2023-02-10: qty 30, 30d supply, fill #2
  Filled 2023-03-09 (×2): qty 30, 30d supply, fill #3

## 2022-12-02 MED ORDER — DEXAMETHASONE 4 MG PO TABS
12.0000 mg | ORAL_TABLET | Freq: Once | ORAL | Status: DC
  Filled 2022-12-02: qty 3

## 2022-12-02 NOTE — Patient Instructions (Signed)

## 2022-12-02 NOTE — Patient Instructions (Signed)
Daratumumab; Hyaluronidase Injection What is this medication? DARATUMUMAB; HYALURONIDASE (dar a toom ue mab; hye al ur ON i dase) treats multiple myeloma, a type of bone marrow cancer. Daratumumab works by blocking a protein that causes cancer cells to grow and multiply. This helps to slow or stop the spread of cancer cells. Hyaluronidase works by increasing the absorption of other medications in the body to help them work better. This medication may also be used treat amyloidosis, a condition that causes the buildup of a protein (amyloid) in your body. It works by reducing the buildup of this protein, which decreases symptoms. It is a combination medication that contains a monoclonal antibody. This medicine may be used for other purposes; ask your health care provider or pharmacist if you have questions. COMMON BRAND NAME(S): DARZALEX FASPRO What should I tell my care team before I take this medication? They need to know if you have any of these conditions: Heart disease Infection, such as chickenpox, cold sores, herpes, hepatitis B Lung or breathing disease An unusual or allergic reaction to daratumumab, hyaluronidase, other medications, foods, dyes, or preservatives Pregnant or trying to get pregnant Breast-feeding How should I use this medication? This medication is injected under the skin. It is given by your care team in a hospital or clinic setting. Talk to your care team about the use of this medication in children. Special care may be needed. Overdosage: If you think you have taken too much of this medicine contact a poison control center or emergency room at once. NOTE: This medicine is only for you. Do not share this medicine with others. What if I miss a dose? Keep appointments for follow-up doses. It is important not to miss your dose. Call your care team if you are unable to keep an appointment. What may interact with this medication? Interactions have not been studied. This list  may not describe all possible interactions. Give your health care provider a list of all the medicines, herbs, non-prescription drugs, or dietary supplements you use. Also tell them if you smoke, drink alcohol, or use illegal drugs. Some items may interact with your medicine. What should I watch for while using this medication? Your condition will be monitored carefully while you are receiving this medication. This medication can cause serious allergic reactions. To reduce your risk, your care team may give you other medication to take before receiving this one. Be sure to follow the directions from your care team. This medication can affect the results of blood tests to match your blood type. These changes can last for up to 6 months after the final dose. Your care team will do blood tests to match your blood type before you start treatment. Tell all of your care team that you are being treated with this medication before receiving a blood transfusion. This medication can affect the results of some tests used to determine treatment response; extra tests may be needed to evaluate response. Talk to your care team if you wish to become pregnant or think you are pregnant. This medication can cause serious birth defects if taken during pregnancy and for 3 months after the last dose. A reliable form of contraception is recommended while taking this medication and for 3 months after the last dose. Talk to your care team about effective forms of contraception. Do not breast-feed while taking this medication. What side effects may I notice from receiving this medication? Side effects that you should report to your care team as soon as   possible: Allergic reactions--skin rash, itching, hives, swelling of the face, lips, tongue, or throat Heart rhythm changes--fast or irregular heartbeat, dizziness, feeling faint or lightheaded, chest pain, trouble breathing Infection--fever, chills, cough, sore throat, wounds that  don't heal, pain or trouble when passing urine, general feeling of discomfort or being unwell Infusion reactions--chest pain, shortness of breath or trouble breathing, feeling faint or lightheaded Sudden eye pain or change in vision such as blurry vision, seeing halos around lights, vision loss Unusual bruising or bleeding Side effects that usually do not require medical attention (report to your care team if they continue or are bothersome): Constipation Diarrhea Fatigue Nausea Pain, tingling, or numbness in the hands or feet Swelling of the ankles, hands, or feet This list may not describe all possible side effects. Call your doctor for medical advice about side effects. You may report side effects to FDA at 1-800-FDA-1088. Where should I keep my medication? This medication is given in a hospital or clinic. It will not be stored at home. NOTE: This sheet is a summary. It may not cover all possible information. If you have questions about this medicine, talk to your doctor, pharmacist, or health care provider.  2023 Elsevier/Gold Standard (2022-03-05 00:00:00)  

## 2022-12-02 NOTE — Progress Notes (Signed)
Patient refused to wait 1 hour post Darzalex injection. Released stable and ASX.

## 2022-12-09 ENCOUNTER — Inpatient Hospital Stay: Payer: Medicare Other

## 2022-12-09 VITALS — BP 146/79 | HR 76 | Temp 97.7°F | Resp 18

## 2022-12-09 DIAGNOSIS — C50911 Malignant neoplasm of unspecified site of right female breast: Secondary | ICD-10-CM

## 2022-12-09 DIAGNOSIS — C9001 Multiple myeloma in remission: Secondary | ICD-10-CM

## 2022-12-09 DIAGNOSIS — Z5111 Encounter for antineoplastic chemotherapy: Secondary | ICD-10-CM | POA: Diagnosis not present

## 2022-12-09 LAB — CMP (CANCER CENTER ONLY)
ALT: 12 U/L (ref 0–44)
AST: 10 U/L — ABNORMAL LOW (ref 15–41)
Albumin: 4 g/dL (ref 3.5–5.0)
Alkaline Phosphatase: 54 U/L (ref 38–126)
Anion gap: 10 (ref 5–15)
BUN: 27 mg/dL — ABNORMAL HIGH (ref 8–23)
CO2: 27 mmol/L (ref 22–32)
Calcium: 9.4 mg/dL (ref 8.9–10.3)
Chloride: 103 mmol/L (ref 98–111)
Creatinine: 1.28 mg/dL — ABNORMAL HIGH (ref 0.44–1.00)
GFR, Estimated: 44 mL/min — ABNORMAL LOW (ref >60)
Glucose, Bld: 92 mg/dL (ref 70–99)
Potassium: 3.8 mmol/L (ref 3.5–5.1)
Sodium: 140 mmol/L (ref 135–145)
Total Bilirubin: 0.8 mg/dL (ref 0.3–1.2)
Total Protein: 6 g/dL — ABNORMAL LOW (ref 6.5–8.1)

## 2022-12-09 LAB — CBC WITH DIFFERENTIAL (CANCER CENTER ONLY)
Abs Immature Granulocytes: 0.09 10*3/uL — ABNORMAL HIGH (ref 0.00–0.07)
Basophils Absolute: 0 10*3/uL (ref 0.0–0.1)
Basophils Relative: 0 %
Eosinophils Absolute: 0.1 10*3/uL (ref 0.0–0.5)
Eosinophils Relative: 2 %
HCT: 41.4 % (ref 36.0–46.0)
Hemoglobin: 13.5 g/dL (ref 12.0–15.0)
Immature Granulocytes: 1 %
Lymphocytes Relative: 33 %
Lymphs Abs: 2.5 10*3/uL (ref 0.7–4.0)
MCH: 32.8 pg (ref 26.0–34.0)
MCHC: 32.6 g/dL (ref 30.0–36.0)
MCV: 100.7 fL — ABNORMAL HIGH (ref 80.0–100.0)
Monocytes Absolute: 0.7 10*3/uL (ref 0.1–1.0)
Monocytes Relative: 10 %
Neutro Abs: 4.1 10*3/uL (ref 1.7–7.7)
Neutrophils Relative %: 54 %
Platelet Count: 130 10*3/uL — ABNORMAL LOW (ref 150–400)
RBC: 4.11 MIL/uL (ref 3.87–5.11)
RDW: 15.4 % (ref 11.5–15.5)
WBC Count: 7.5 10*3/uL (ref 4.0–10.5)
nRBC: 0 % (ref 0.0–0.2)

## 2022-12-09 MED ORDER — DIPHENHYDRAMINE HCL 25 MG PO CAPS: 50.0000 mg | ORAL_CAPSULE | Freq: Once | ORAL | Status: DC

## 2022-12-09 MED ORDER — DEXTROSE 5 % IV SOLN
70.0000 mg/m2 | Freq: Once | INTRAVENOUS | Status: AC
  Administered 2022-12-09: 140 mg via INTRAVENOUS
  Filled 2022-12-09: qty 60

## 2022-12-09 MED ORDER — ACETAMINOPHEN 325 MG PO TABS: 650.0000 mg | ORAL_TABLET | Freq: Once | ORAL | Status: DC

## 2022-12-09 MED ORDER — DARATUMUMAB-HYALURONIDASE-FIHJ 1800-30000 MG-UT/15ML ~~LOC~~ SOLN
1800.0000 mg | Freq: Once | SUBCUTANEOUS | Status: AC
  Administered 2022-12-09: 1800 mg via SUBCUTANEOUS
  Filled 2022-12-09: qty 15

## 2022-12-09 MED ORDER — METHYLPREDNISOLONE SODIUM SUCC 125 MG IJ SOLR
80.0000 mg | Freq: Once | INTRAMUSCULAR | Status: DC
  Filled 2022-12-09: qty 2

## 2022-12-09 MED ORDER — SODIUM CHLORIDE 0.9 % IV SOLN: Freq: Once | INTRAVENOUS | Status: AC

## 2022-12-09 MED ORDER — SODIUM CHLORIDE 0.9 % IV SOLN: Freq: Once | INTRAVENOUS | Status: DC

## 2022-12-09 MED ORDER — SODIUM CHLORIDE 0.9% FLUSH
10.0000 mL | INTRAVENOUS | Status: DC | PRN
  Administered 2022-12-09: 10 mL

## 2022-12-09 MED ORDER — METHYLPREDNISOLONE SODIUM SUCC 125 MG IJ SOLR
60.0000 mg | Freq: Once | INTRAMUSCULAR | Status: AC
  Administered 2022-12-09: 60 mg via INTRAVENOUS

## 2022-12-09 MED ORDER — DEXAMETHASONE 4 MG PO TABS: 12.0000 mg | ORAL_TABLET | Freq: Once | ORAL | Status: DC

## 2022-12-09 NOTE — Patient Instructions (Signed)
[facility]  Discharge Instructions: Thank you for choosing [facility] to provide your oncology and hematology care.   If you have a lab appointment with the [facility], please go directly to the [facility] and check in at the registration area.  Wear comfortable clothing and clothing appropriate for easy access to any Portacath or PICC line.   We strive to give you quality time with your provider. You may need to reschedule your appointment if you arrive late (15 or more minutes).  Arriving late affects you and other patients whose appointments are after yours.  Also, if you miss three or more appointments without notifying the office, you may be dismissed from the clinic at the provider's discretion.      For prescription refill requests, have your pharmacy contact our office and allow 72 hours for refills to be completed.    Today you received the following chemotherapy and/or immunotherapy agents Kyprolis/Darzalex       To help prevent nausea and vomiting after your treatment, we encourage you to take your nausea medication as directed.  BELOW ARE SYMPTOMS THAT SHOULD BE REPORTED IMMEDIATELY: *FEVER GREATER THAN 100.4 F (38 C) OR HIGHER *CHILLS OR SWEATING *NAUSEA AND VOMITING THAT IS NOT CONTROLLED WITH YOUR NAUSEA MEDICATION *UNUSUAL SHORTNESS OF BREATH *UNUSUAL BRUISING OR BLEEDING *URINARY PROBLEMS (pain or burning when urinating, or frequent urination) *BOWEL PROBLEMS (unusual diarrhea, constipation, pain near the anus) TENDERNESS IN MOUTH AND THROAT WITH OR WITHOUT PRESENCE OF ULCERS (sore throat, sores in mouth, or a toothache) UNUSUAL RASH, SWELLING OR PAIN  UNUSUAL VAGINAL DISCHARGE OR ITCHING   Items with * indicate a potential emergency and should be followed up as soon as possible or go to the Emergency Department if any problems should occur.  Please show the CHEMOTHERAPY ALERT CARD or IMMUNOTHERAPY ALERT CARD at check-in  to the Emergency Department and triage nurse. Should you have questions after your visit or need to cancel or reschedule your appointment, please contact [facility]  [phone] and follow the prompts.  Office hours are 8:00 a.m. to 4:30 p.m. Monday - Friday. Please note that voicemails left after 4:00 p.m. may not be returned until the following business day.  We are closed weekends and major holidays. You have access to a nurse at all times for urgent questions. Please call the main number to the clinic [phone] and follow the prompts.  For any non-urgent questions, you may also contact your provider using MyChart. We now offer e-Visits for anyone 52 and older to request care online for non-urgent symptoms. For details visit [email or web address].   Also download the MyChart app! Go to the app store, search "MyChart", open the app, select [facility], and log in with your MyChart username and password.

## 2022-12-09 NOTE — Progress Notes (Signed)
Patient states she started having increased shortness of breath after her treatment on 11/25/2022. Patient states the shortness of breath she experienced after last week was a continuance of the previous week. Patient states this office prescribed a medrol dose pack which she completed on 12/08/2022. Patient denies any shortness of breath today. Dr. [name] notified. Patient would like to hold the pre hydration fluids typically given before Kyprolis administration as well. Give 60 mg Solu Medrol today before treatment per Dr. [name].

## 2022-12-23 ENCOUNTER — Inpatient Hospital Stay: Payer: Medicare Other

## 2022-12-23 ENCOUNTER — Other Ambulatory Visit: Payer: Self-pay

## 2022-12-23 ENCOUNTER — Encounter: Payer: Self-pay | Admitting: Hematology & Oncology

## 2022-12-23 ENCOUNTER — Inpatient Hospital Stay (HOSPITAL_BASED_OUTPATIENT_CLINIC_OR_DEPARTMENT_OTHER): Payer: Medicare Other | Admitting: Hematology & Oncology

## 2022-12-23 VITALS — BP 111/49 | HR 97 | Temp 97.8°F | Resp 18 | Ht 58.5 in | Wt 223.0 lb

## 2022-12-23 VITALS — BP 121/79 | HR 86 | Temp 97.7°F | Resp 18

## 2022-12-23 DIAGNOSIS — C9001 Multiple myeloma in remission: Secondary | ICD-10-CM

## 2022-12-23 DIAGNOSIS — C50911 Malignant neoplasm of unspecified site of right female breast: Secondary | ICD-10-CM

## 2022-12-23 DIAGNOSIS — Z5111 Encounter for antineoplastic chemotherapy: Secondary | ICD-10-CM | POA: Diagnosis not present

## 2022-12-23 LAB — CMP (CANCER CENTER ONLY)
ALT: 11 U/L (ref 0–44)
AST: 12 U/L — ABNORMAL LOW (ref 15–41)
Albumin: 4 g/dL (ref 3.5–5.0)
Alkaline Phosphatase: 59 U/L (ref 38–126)
Anion gap: 10 (ref 5–15)
BUN: 18 mg/dL (ref 8–23)
CO2: 26 mmol/L (ref 22–32)
Calcium: 9 mg/dL (ref 8.9–10.3)
Chloride: 104 mmol/L (ref 98–111)
Creatinine: 1.37 mg/dL — ABNORMAL HIGH (ref 0.44–1.00)
GFR, Estimated: 41 mL/min — ABNORMAL LOW (ref >60)
Glucose, Bld: 127 mg/dL — ABNORMAL HIGH (ref 70–99)
Potassium: 3.7 mmol/L (ref 3.5–5.1)
Sodium: 140 mmol/L (ref 135–145)
Total Bilirubin: 0.7 mg/dL (ref 0.3–1.2)
Total Protein: 5.8 g/dL — ABNORMAL LOW (ref 6.5–8.1)

## 2022-12-23 LAB — CBC WITH DIFFERENTIAL (CANCER CENTER ONLY)
Abs Immature Granulocytes: 0.04 10*3/uL (ref 0.00–0.07)
Basophils Absolute: 0 10*3/uL (ref 0.0–0.1)
Basophils Relative: 1 %
Eosinophils Absolute: 0.1 10*3/uL (ref 0.0–0.5)
Eosinophils Relative: 1 %
HCT: 38.4 % (ref 36.0–46.0)
Hemoglobin: 12.5 g/dL (ref 12.0–15.0)
Immature Granulocytes: 1 %
Lymphocytes Relative: 30 %
Lymphs Abs: 1.6 10*3/uL (ref 0.7–4.0)
MCH: 33.7 pg (ref 26.0–34.0)
MCHC: 32.6 g/dL (ref 30.0–36.0)
MCV: 103.5 fL — ABNORMAL HIGH (ref 80.0–100.0)
Monocytes Absolute: 0.4 10*3/uL (ref 0.1–1.0)
Monocytes Relative: 7 %
Neutro Abs: 3.3 10*3/uL (ref 1.7–7.7)
Neutrophils Relative %: 60 %
Platelet Count: 204 10*3/uL (ref 150–400)
RBC: 3.71 MIL/uL — ABNORMAL LOW (ref 3.87–5.11)
RDW: 15.3 % (ref 11.5–15.5)
WBC Count: 5.5 10*3/uL (ref 4.0–10.5)
nRBC: 0.4 % — ABNORMAL HIGH (ref 0.0–0.2)

## 2022-12-23 LAB — LACTATE DEHYDROGENASE: LDH: 148 U/L (ref 98–192)

## 2022-12-23 MED ORDER — DEXTROSE 5 % IV SOLN
70.0000 mg/m2 | Freq: Once | INTRAVENOUS | Status: AC
  Administered 2022-12-23: 140 mg via INTRAVENOUS
  Filled 2022-12-23: qty 60

## 2022-12-23 MED ORDER — DARATUMUMAB-HYALURONIDASE-FIHJ 1800-30000 MG-UT/15ML ~~LOC~~ SOLN
1800.0000 mg | Freq: Once | SUBCUTANEOUS | Status: AC
  Administered 2022-12-23: 1800 mg via SUBCUTANEOUS
  Filled 2022-12-23: qty 15

## 2022-12-23 MED ORDER — ACETAMINOPHEN 325 MG PO TABS: 650.0000 mg | ORAL_TABLET | Freq: Once | ORAL | Status: DC

## 2022-12-23 MED ORDER — SODIUM CHLORIDE 0.9% FLUSH: 10.0000 mL | INTRAVENOUS | Status: DC | PRN

## 2022-12-23 MED ORDER — DEXAMETHASONE 4 MG PO TABS: 12.0000 mg | ORAL_TABLET | Freq: Once | ORAL | Status: DC

## 2022-12-23 MED ORDER — SODIUM CHLORIDE 0.9% FLUSH
10.0000 mL | INTRAVENOUS | Status: DC | PRN
  Administered 2022-12-23: 10 mL

## 2022-12-23 MED ORDER — SODIUM CHLORIDE 0.9 % IV SOLN: Freq: Once | INTRAVENOUS | Status: AC

## 2022-12-23 MED ORDER — DIPHENHYDRAMINE HCL 25 MG PO CAPS: 50.0000 mg | ORAL_CAPSULE | Freq: Once | ORAL | Status: DC

## 2022-12-23 MED ORDER — DARATUMUMAB-HYALURONIDASE-FIHJ 1800-30000 MG-UT/15ML ~~LOC~~ SOLN: 1800.0000 mg | Freq: Once | SUBCUTANEOUS | Status: DC

## 2022-12-23 NOTE — Patient Instructions (Signed)

## 2022-12-23 NOTE — Progress Notes (Signed)
Hematology and Oncology Follow Up Visit  Marcia [name] [id number] [birth year] 72 y.o. 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 is here today for follow-up.  She actually looks quite good.  She feels pretty good.  She really has had no specific complaints.  I think she is tolerated treatment nicely.  It does seem to be working.  Her last Kappa light chain was 14.9 mg/dL.  She has had no problems with bleeding.  She is on Eliquis.  She has had no change in bowel or bladder habits.  There is been no rashes.  She has had no leg swelling.  She has had no cough or shortness of breath.  Her appetite has been pretty good.  She is try to watch what she eats.   I am just happy that her quality life seems to be doing quite well.  As far as the breast cancer, this really has not been a problem for [place].  Overall, I would say that her performance status is probably ECOG 1.   Medications:  Allergies as of 12/23/2022       Reactions   Heparin Other (See Comments)   HIT   Sulfamethoxazole-trimethoprim Other (See Comments), Rash   ? Renal failure.   Zofran [ondansetron] Shortness Of Breath   "felt like throat was closing"   Clarithromycin Diarrhea, Nausea And Vomiting   Latex Rash    Macrodantin [nitrofurantoin] Rash        Medication List        Accurate as of December 23, 2022 12:20 PM. If you have any questions, ask your nurse or doctor.          acetaminophen 325 MG tablet Commonly known as: TYLENOL Take 650 mg by mouth daily as needed for moderate pain or headache.   dexamethasone 6 MG tablet Commonly known as: DECADRON Take 2 tablets (12 mg total) by mouth daily. Take 30 minutes prior to treatment.   Eliquis 2.5 MG Tabs tablet Generic drug: apixaban TAKE 1 TABLET BY MOUTH TWICE A DAY   famciclovir 250 MG tablet Commonly known as: FAMVIR TAKE 1 TABLET BY MOUTH EVERY DAY   feeding supplement Liqd Take 237 mLs by mouth 3 (three) times daily between meals. What changed: when to take this   folic acid 1 MG tablet Commonly known as: FOLVITE TAKE 2 TABLETS (2 MG TOTAL) BY MOUTH DAILY.   LORazepam 0.5 MG tablet Commonly known as: ATIVAN Take 1 tablet by mouth daily as needed.   losartan 25 MG tablet Commonly known as: COZAAR Take 25 mg by mouth daily.   methylPREDNISolone 4 MG Tbpk tablet Commonly known as: MEDROL DOSEPAK Take as directed on pack for 6 days.   montelukast 10 MG tablet Commonly known as: Singulair Take 1 tablet (10 mg total) by mouth  at bedtime.   NON FORMULARY Nerve Savior 2 capsules daily   prochlorperazine 10 MG tablet Commonly known as: COMPAZINE Take 1 tablet (10 mg total) by mouth every 6 (six) hours as needed for nausea or vomiting.   traMADol 50 MG tablet Commonly known as: ULTRAM Take 1 tablet by mouth every 12 (twelve) hours as needed.   VITAMIN E PO daily.        Allergies:  Allergies  Allergen Reactions   Heparin Other (See Comments)    HIT   Sulfamethoxazole-Trimethoprim Other (See Comments) and Rash    ? Renal failure.   Zofran [Ondansetron] Shortness Of Breath    "felt like throat was closing"   Clarithromycin Diarrhea and Nausea And Vomiting   Latex Rash   Macrodantin  [Nitrofurantoin] Rash    Past Medical History, Surgical history, Social history, and Family History were reviewed and updated.  Review of Systems: Review of Systems  Constitutional: Negative.   HENT: Negative.    Eyes: Negative.   Respiratory: Negative.    Cardiovascular: Negative.   Gastrointestinal: Negative.   Genitourinary: Negative.   Musculoskeletal: Negative.   Skin: Negative.   Neurological: Negative.   Endo/Heme/Allergies: Negative.   Psychiatric/Behavioral: Negative.        Physical Exam:  height is 4' 10.5" (1.486 m) and weight is 223 lb (101.2 kg). Her oral temperature is 97.8 F (36.6 C). Her blood pressure is 111/49 (abnormal) and her pulse is 97. Her respiration is 18 and oxygen saturation is 100%.   Wt Readings from Last 3 Encounters:  12/23/22 223 lb (101.2 kg)  11/25/22 225 lb (102.1 kg)  10/14/22 229 lb (103.9 kg)    Physical Exam Vitals reviewed.  HENT:     Head: Normocephalic and atraumatic.  Eyes:     Pupils: Pupils are equal, round, and reactive to light.  Cardiovascular:     Rate and Rhythm: Normal rate and regular rhythm.     Heart sounds: Normal heart sounds.  Pulmonary:     Effort: Pulmonary effort is normal.     Breath sounds: Normal breath sounds.  Abdominal:     General: Bowel sounds are normal.     Palpations: Abdomen is soft.  Musculoskeletal:        General: No tenderness or deformity. Normal range of motion.     Cervical back: Normal range of motion.  Lymphadenopathy:     Cervical: No cervical adenopathy.  Skin:    General: Skin is warm and dry.     Findings: No erythema or rash.  Neurological:     Mental Status: She is alert and oriented to person, place, and time.  Psychiatric:        Behavior: Behavior normal.        Thought Content: Thought content normal.        Judgment: Judgment normal.      Lab Results  Component Value Date   WBC 5.5 12/23/2022   HGB 12.5 12/23/2022   HCT 38.4 12/23/2022   MCV 103.5 (H)  12/23/2022   PLT 204 12/23/2022   Lab Results  Component Value Date   FERRITIN 820 (H) 05/17/2018   IRON 272 (H) 05/17/2018   TIBC 295 05/17/2018   UIBC 23 05/17/2018   IRONPCTSAT 92 (H) 05/17/2018   Lab Results  Component Value Date   RETICCTPCT 1.3 03/27/2011   RBC 3.71 (L) 12/23/2022   RETICCTABS 56.4 03/27/2011   Lab Results  Component Value Date   KPAFRELGTCHN 142.9 (  H) 11/25/2022   LAMBDASER 3.7 (L) 11/25/2022   KAPLAMBRATIO 38.62 (H) 11/25/2022   Lab Results  Component Value Date   IGGSERUM 326 (L) 11/25/2022   IGA 15 (L) 11/25/2022   IGMSERUM 14 (L) 11/25/2022   Lab Results  Component Value Date   TOTALPROTELP 5.7 (L) 11/25/2022   ALBUMINELP 3.4 11/25/2022   A1GS 0.2 11/25/2022   A2GS 0.7 11/25/2022   BETS 1.1 11/25/2022   BETA2SER 0.4 07/02/2015   GAMS 0.3 (L) 11/25/2022   MSPIKE Not Observed 11/25/2022   SPEI Comment 10/02/2022     Chemistry      Component Value Date/Time   NA 140 12/23/2022 1114   NA 141 07/06/2017 0816   NA 141 01/05/2017 0956   K 3.7 12/23/2022 1114   K 3.6 07/06/2017 0816   K 4.3 01/05/2017 0956   CL 104 12/23/2022 1114   CL 101 07/06/2017 0816   CO2 26 12/23/2022 1114   CO2 28 07/06/2017 0816   CO2 23 01/05/2017 0956   BUN 18 12/23/2022 1114   BUN 28 (H) 07/06/2017 0816   BUN 22.4 01/05/2017 0956   CREATININE 1.37 (H) 12/23/2022 1114   CREATININE 1.6 (H) 07/06/2017 0816   CREATININE 1.4 (H) 01/05/2017 0956      Component Value Date/Time   CALCIUM 9.0 12/23/2022 1114   CALCIUM 9.1 07/06/2017 0816   CALCIUM 9.3 01/05/2017 0956   ALKPHOS 59 12/23/2022 1114   ALKPHOS 58 07/06/2017 0816   ALKPHOS 52 01/05/2017 0956   AST 12 (L) 12/23/2022 1114   AST 14 01/05/2017 0956   ALT 11 12/23/2022 1114   ALT 58 (H) 07/06/2017 0816   ALT 13 01/05/2017 0956   BILITOT 0.7 12/23/2022 1114   BILITOT 0.56 01/05/2017 0956       Impression and Plan: Ms. Fross is a very pleasant 72 yo caucasian female with kappa light chain  myeloma that has recurred again.    We will go ahead with her third cycle of treatment.  Hopefully, we will see that her kappa light chains are coming down nicely.  I am just happy that she is doing well.  I just hope that she continues to respond.  I still would consider her for the possibility of CAR-T-therapy or for specific therapy.  We will plan to get her back in another month.     [name], MD 1/30/202412:20 PM  ADDENDUM: I did check a CA 27.29.  This was somewhat elevated at 58.  I am a little bit troubled by this.  I think we will have to do a CT scan to see if we are missing anything.  We will see about getting the CT scan set up for next week.  [name], MD

## 2022-12-23 NOTE — Progress Notes (Signed)
Pt declined to stay for post infusion observation period. Pt stated she has tolerated medication multiple times prior without difficulty. Pt aware to call clinic with any questions or concerns. Pt verbalized understanding and had no further questions.  ? ?

## 2022-12-23 NOTE — Patient Instructions (Signed)
[facility] [place]  Discharge Instructions: Thank you for choosing [facility] to provide your oncology and hematology care.   If you have a lab appointment with the [facility], please go directly to the [facility] and check in at the registration area.  Wear comfortable clothing and clothing appropriate for easy access to any Portacath or PICC line.   We strive to give you quality time with your provider. You may need to reschedule your appointment if you arrive late (15 or more minutes).  Arriving late affects you and other patients whose appointments are after yours.  Also, if you miss three or more appointments without notifying the office, you may be dismissed from the clinic at the provider's discretion.      For prescription refill requests, have your pharmacy contact our office and allow 72 hours for refills to be completed.    Today you received the following chemotherapy and/or immunotherapy agents Kyprolis/Darzalex       To help prevent nausea and vomiting after your treatment, we encourage you to take your nausea medication as directed.  BELOW ARE SYMPTOMS THAT SHOULD BE REPORTED IMMEDIATELY: *FEVER GREATER THAN 100.4 F (38 C) OR HIGHER *CHILLS OR SWEATING *NAUSEA AND VOMITING THAT IS NOT CONTROLLED WITH YOUR NAUSEA MEDICATION *UNUSUAL SHORTNESS OF BREATH *UNUSUAL BRUISING OR BLEEDING *URINARY PROBLEMS (pain or burning when urinating, or frequent urination) *BOWEL PROBLEMS (unusual diarrhea, constipation, pain near the anus) TENDERNESS IN MOUTH AND THROAT WITH OR WITHOUT PRESENCE OF ULCERS (sore throat, sores in mouth, or a toothache) UNUSUAL RASH, SWELLING OR PAIN  UNUSUAL VAGINAL DISCHARGE OR ITCHING   Items with * indicate a potential emergency and should be followed up as soon as possible or go to the Emergency Department if any problems should occur.  Please show the CHEMOTHERAPY ALERT CARD or IMMUNOTHERAPY ALERT CARD  at check-in to the Emergency Department and triage nurse. Should you have questions after your visit or need to cancel or reschedule your appointment, please contact [facility]  [phone] and follow the prompts.  Office hours are 8:00 a.m. to 4:30 p.m. Monday - Friday. Please note that voicemails left after 4:00 p.m. may not be returned until the following business day.  We are closed weekends and major holidays. You have access to a nurse at all times for urgent questions. Please call the main number to the clinic [phone] and follow the prompts.  For any non-urgent questions, you may also contact your provider using MyChart. We now offer e-Visits for anyone 44 and older to request care online for non-urgent symptoms. For details visit [email or web address].   Also download the MyChart app! Go to the app store, search "MyChart", open the app, select [facility], and log in with your MyChart username and password.

## 2022-12-24 ENCOUNTER — Other Ambulatory Visit: Payer: Self-pay

## 2022-12-24 ENCOUNTER — Telehealth: Payer: Self-pay

## 2022-12-24 LAB — KAPPA/LAMBDA LIGHT CHAINS
Kappa free light chain: 123.6 mg/L — ABNORMAL HIGH (ref 3.3–19.4)
Kappa, lambda light chain ratio: 23.32 — ABNORMAL HIGH (ref 0.26–1.65)
Lambda free light chains: 5.3 mg/L — ABNORMAL LOW (ref 5.7–26.3)

## 2022-12-24 LAB — CANCER ANTIGEN 27.29: CA 27.29: 58.1 U/mL — ABNORMAL HIGH (ref 0.0–38.6)

## 2022-12-24 NOTE — Telephone Encounter (Signed)
Called and informed patient of results and CT ordered by MD. Patient aware and will get Ct scheduled at [facility].

## 2022-12-24 NOTE — Addendum Note (Signed)
Addended by: Burney Gauze R on: 12/24/2022 02:01 PM   Modules accepted: Orders

## 2022-12-24 NOTE — Telephone Encounter (Signed)
-----  Message from [name], MD sent at 12/24/2022  1:36 PM EST ----- Call and let her know that the Kappa light chain is now down to 123.  I did note that the breast cancer marker is up a little bit.  We may need to do some CAT scans to make sure nothing is going on with the breast cancer.  Thanks.  Laurey Arrow

## 2022-12-25 LAB — IGG, IGA, IGM
IgA: 11 mg/dL — ABNORMAL LOW (ref 64–422)
IgG (Immunoglobin G), Serum: 293 mg/dL — ABNORMAL LOW (ref 586–1602)
IgM (Immunoglobulin M), Srm: 11 mg/dL — ABNORMAL LOW (ref 26–217)

## 2022-12-26 ENCOUNTER — Other Ambulatory Visit: Payer: Self-pay

## 2022-12-26 LAB — PROTEIN ELECTROPHORESIS, SERUM, WITH REFLEX
A/G Ratio: 1.5 (ref 0.7–1.7)
Albumin ELP: 3.3 g/dL (ref 2.9–4.4)
Alpha-1-Globulin: 0.2 g/dL (ref 0.0–0.4)
Alpha-2-Globulin: 0.7 g/dL (ref 0.4–1.0)
Beta Globulin: 1 g/dL (ref 0.7–1.3)
Gamma Globulin: 0.3 g/dL — ABNORMAL LOW (ref 0.4–1.8)
Globulin, Total: 2.2 g/dL (ref 2.2–3.9)
Total Protein ELP: 5.5 g/dL — ABNORMAL LOW (ref 6.0–8.5)

## 2022-12-30 ENCOUNTER — Inpatient Hospital Stay: Payer: Medicare Other

## 2022-12-30 ENCOUNTER — Inpatient Hospital Stay: Payer: Medicare Other | Attending: Hematology & Oncology

## 2022-12-30 ENCOUNTER — Other Ambulatory Visit (HOSPITAL_BASED_OUTPATIENT_CLINIC_OR_DEPARTMENT_OTHER): Payer: Self-pay

## 2022-12-30 ENCOUNTER — Inpatient Hospital Stay (HOSPITAL_BASED_OUTPATIENT_CLINIC_OR_DEPARTMENT_OTHER): Payer: Medicare Other

## 2022-12-30 ENCOUNTER — Encounter: Payer: Self-pay | Admitting: *Deleted

## 2022-12-30 VITALS — BP 134/62 | HR 82 | Temp 98.0°F | Resp 18

## 2022-12-30 DIAGNOSIS — M899 Disorder of bone, unspecified: Secondary | ICD-10-CM | POA: Diagnosis not present

## 2022-12-30 DIAGNOSIS — Z7901 Long term (current) use of anticoagulants: Secondary | ICD-10-CM | POA: Insufficient documentation

## 2022-12-30 DIAGNOSIS — C50911 Malignant neoplasm of unspecified site of right female breast: Secondary | ICD-10-CM

## 2022-12-30 DIAGNOSIS — Z9484 Stem cells transplant status: Secondary | ICD-10-CM | POA: Insufficient documentation

## 2022-12-30 DIAGNOSIS — Z5111 Encounter for antineoplastic chemotherapy: Secondary | ICD-10-CM | POA: Insufficient documentation

## 2022-12-30 DIAGNOSIS — Z86718 Personal history of other venous thrombosis and embolism: Secondary | ICD-10-CM | POA: Diagnosis not present

## 2022-12-30 DIAGNOSIS — C9000 Multiple myeloma not having achieved remission: Secondary | ICD-10-CM | POA: Diagnosis not present

## 2022-12-30 DIAGNOSIS — C9001 Multiple myeloma in remission: Secondary | ICD-10-CM

## 2022-12-30 DIAGNOSIS — Z171 Estrogen receptor negative status [ER-]: Secondary | ICD-10-CM | POA: Insufficient documentation

## 2022-12-30 DIAGNOSIS — Z5112 Encounter for antineoplastic immunotherapy: Secondary | ICD-10-CM | POA: Diagnosis not present

## 2022-12-30 DIAGNOSIS — Z86711 Personal history of pulmonary embolism: Secondary | ICD-10-CM | POA: Diagnosis not present

## 2022-12-30 LAB — CMP (CANCER CENTER ONLY)
ALT: 22 U/L (ref 0–44)
AST: 19 U/L (ref 15–41)
Albumin: 4 g/dL (ref 3.5–5.0)
Alkaline Phosphatase: 66 U/L (ref 38–126)
Anion gap: 9 (ref 5–15)
BUN: 19 mg/dL (ref 8–23)
CO2: 25 mmol/L (ref 22–32)
Calcium: 9 mg/dL (ref 8.9–10.3)
Chloride: 105 mmol/L (ref 98–111)
Creatinine: 1.27 mg/dL — ABNORMAL HIGH (ref 0.44–1.00)
GFR, Estimated: 45 mL/min — ABNORMAL LOW (ref >60)
Glucose, Bld: 95 mg/dL (ref 70–99)
Potassium: 3.9 mmol/L (ref 3.5–5.1)
Sodium: 139 mmol/L (ref 135–145)
Total Bilirubin: 0.9 mg/dL (ref 0.3–1.2)
Total Protein: 6.1 g/dL — ABNORMAL LOW (ref 6.5–8.1)

## 2022-12-30 LAB — CBC WITH DIFFERENTIAL (CANCER CENTER ONLY)
Abs Immature Granulocytes: 0.08 10*3/uL — ABNORMAL HIGH (ref 0.00–0.07)
Basophils Absolute: 0 10*3/uL (ref 0.0–0.1)
Basophils Relative: 0 %
Eosinophils Absolute: 0 10*3/uL (ref 0.0–0.5)
Eosinophils Relative: 1 %
HCT: 37.3 % (ref 36.0–46.0)
Hemoglobin: 12.1 g/dL (ref 12.0–15.0)
Immature Granulocytes: 2 %
Lymphocytes Relative: 40 %
Lymphs Abs: 1.9 10*3/uL (ref 0.7–4.0)
MCH: 33.9 pg (ref 26.0–34.0)
MCHC: 32.4 g/dL (ref 30.0–36.0)
MCV: 104.5 fL — ABNORMAL HIGH (ref 80.0–100.0)
Monocytes Absolute: 0.6 10*3/uL (ref 0.1–1.0)
Monocytes Relative: 12 %
Neutro Abs: 2.1 10*3/uL (ref 1.7–7.7)
Neutrophils Relative %: 45 %
Platelet Count: 108 10*3/uL — ABNORMAL LOW (ref 150–400)
RBC: 3.57 MIL/uL — ABNORMAL LOW (ref 3.87–5.11)
RDW: 15.7 % — ABNORMAL HIGH (ref 11.5–15.5)
WBC Count: 4.7 10*3/uL (ref 4.0–10.5)
nRBC: 0.6 % — ABNORMAL HIGH (ref 0.0–0.2)

## 2022-12-30 MED ORDER — SODIUM CHLORIDE 0.9% FLUSH
10.0000 mL | INTRAVENOUS | Status: DC | PRN
  Administered 2022-12-30: 10 mL

## 2022-12-30 MED ORDER — SODIUM CHLORIDE 0.9 % IV SOLN: Freq: Once | INTRAVENOUS | Status: AC

## 2022-12-30 MED ORDER — SODIUM CHLORIDE 0.9 % IV SOLN: Freq: Once | INTRAVENOUS | Status: DC

## 2022-12-30 MED ORDER — DEXAMETHASONE 4 MG PO TABS: 12.0000 mg | ORAL_TABLET | Freq: Once | ORAL | Status: DC

## 2022-12-30 MED ORDER — DEXTROSE 5 % IV SOLN
70.0000 mg/m2 | Freq: Once | INTRAVENOUS | Status: AC
  Administered 2022-12-30: 140 mg via INTRAVENOUS
  Filled 2022-12-30: qty 60

## 2022-12-30 NOTE — Patient Instructions (Signed)

## 2022-12-30 NOTE — Patient Instructions (Signed)
[facility]  Discharge Instructions: Thank you for choosing [facility] to provide your oncology and hematology care.   If you have a lab appointment with the Cancer Center, please go directly to the Cancer Center and check in at the registration area.  Wear comfortable clothing and clothing appropriate for easy access to any Portacath or PICC line.   We strive to give you quality time with your provider. You may need to reschedule your appointment if you arrive late (15 or more minutes).  Arriving late affects you and other patients whose appointments are after yours.  Also, if you miss three or more appointments without notifying the office, you may be dismissed from the clinic at the provider's discretion.      For prescription refill requests, have your pharmacy contact our office and allow 72 hours for refills to be completed.    Today you received the following chemotherapy and/or immunotherapy agents Kyprolis      To help prevent nausea and vomiting after your treatment, we encourage you to take your nausea medication as directed.  BELOW ARE SYMPTOMS THAT SHOULD BE REPORTED IMMEDIATELY: *FEVER GREATER THAN 100.4 F (38 C) OR HIGHER *CHILLS OR SWEATING *NAUSEA AND VOMITING THAT IS NOT CONTROLLED WITH YOUR NAUSEA MEDICATION *UNUSUAL SHORTNESS OF BREATH *UNUSUAL BRUISING OR BLEEDING *URINARY PROBLEMS (pain or burning when urinating, or frequent urination) *BOWEL PROBLEMS (unusual diarrhea, constipation, pain near the anus) TENDERNESS IN MOUTH AND THROAT WITH OR WITHOUT PRESENCE OF ULCERS (sore throat, sores in mouth, or a toothache) UNUSUAL RASH, SWELLING OR PAIN  UNUSUAL VAGINAL DISCHARGE OR ITCHING   Items with * indicate a potential emergency and should be followed up as soon as possible or go to the Emergency Department if any problems should occur.  Please show the CHEMOTHERAPY ALERT CARD or IMMUNOTHERAPY ALERT CARD at check-in  to the Emergency Department and triage nurse. Should you have questions after your visit or need to cancel or reschedule your appointment, please contact [facility]  [phone] and follow the prompts.  Office hours are 8:00 a.m. to 4:30 p.m. Monday - Friday. Please note that voicemails left after 4:00 p.m. may not be returned until the following business day.  We are closed weekends and major holidays. You have access to a nurse at all times for urgent questions. Please call the main number to the clinic [phone] and follow the prompts.  For any non-urgent questions, you may also contact your provider using MyChart. We now offer e-Visits for anyone 18 and older to request care online for non-urgent symptoms. For details visit [email or web address].   Also download the MyChart app! Go to the app store, search "MyChart", open the app, select [facility], and log in with your MyChart username and password.   

## 2023-01-06 ENCOUNTER — Other Ambulatory Visit: Payer: Self-pay | Admitting: Pharmacist

## 2023-01-06 ENCOUNTER — Inpatient Hospital Stay: Payer: Medicare Other

## 2023-01-06 VITALS — BP 143/69 | HR 81 | Temp 98.1°F | Resp 18

## 2023-01-06 DIAGNOSIS — C50911 Malignant neoplasm of unspecified site of right female breast: Secondary | ICD-10-CM

## 2023-01-06 DIAGNOSIS — Z5111 Encounter for antineoplastic chemotherapy: Secondary | ICD-10-CM | POA: Diagnosis not present

## 2023-01-06 DIAGNOSIS — C9001 Multiple myeloma in remission: Secondary | ICD-10-CM

## 2023-01-06 LAB — CMP (CANCER CENTER ONLY)
ALT: 17 U/L (ref 0–44)
AST: 15 U/L (ref 15–41)
Albumin: 4.1 g/dL (ref 3.5–5.0)
Alkaline Phosphatase: 65 U/L (ref 38–126)
Anion gap: 11 (ref 5–15)
BUN: 23 mg/dL (ref 8–23)
CO2: 26 mmol/L (ref 22–32)
Calcium: 9.6 mg/dL (ref 8.9–10.3)
Chloride: 103 mmol/L (ref 98–111)
Creatinine: 1.2 mg/dL — ABNORMAL HIGH (ref 0.44–1.00)
GFR, Estimated: 48 mL/min — ABNORMAL LOW (ref >60)
Glucose, Bld: 102 mg/dL — ABNORMAL HIGH (ref 70–99)
Potassium: 4 mmol/L (ref 3.5–5.1)
Sodium: 140 mmol/L (ref 135–145)
Total Bilirubin: 0.9 mg/dL (ref 0.3–1.2)
Total Protein: 6.3 g/dL — ABNORMAL LOW (ref 6.5–8.1)

## 2023-01-06 LAB — CBC WITH DIFFERENTIAL (CANCER CENTER ONLY)
Abs Immature Granulocytes: 0.05 10*3/uL (ref 0.00–0.07)
Basophils Absolute: 0 10*3/uL (ref 0.0–0.1)
Basophils Relative: 0 %
Eosinophils Absolute: 0.1 10*3/uL (ref 0.0–0.5)
Eosinophils Relative: 1 %
HCT: 37.2 % (ref 36.0–46.0)
Hemoglobin: 12.4 g/dL (ref 12.0–15.0)
Immature Granulocytes: 1 %
Lymphocytes Relative: 31 %
Lymphs Abs: 1.9 10*3/uL (ref 0.7–4.0)
MCH: 35.2 pg — ABNORMAL HIGH (ref 26.0–34.0)
MCHC: 33.3 g/dL (ref 30.0–36.0)
MCV: 105.7 fL — ABNORMAL HIGH (ref 80.0–100.0)
Monocytes Absolute: 0.6 10*3/uL (ref 0.1–1.0)
Monocytes Relative: 10 %
Neutro Abs: 3.5 10*3/uL (ref 1.7–7.7)
Neutrophils Relative %: 57 %
Platelet Count: 160 10*3/uL (ref 150–400)
RBC: 3.52 MIL/uL — ABNORMAL LOW (ref 3.87–5.11)
RDW: 16.1 % — ABNORMAL HIGH (ref 11.5–15.5)
WBC Count: 6.2 10*3/uL (ref 4.0–10.5)
nRBC: 0 % (ref 0.0–0.2)

## 2023-01-06 MED ORDER — DEXTROSE 5 % IV SOLN
70.0000 mg/m2 | Freq: Once | INTRAVENOUS | Status: DC
  Filled 2023-01-06: qty 70

## 2023-01-06 MED ORDER — ACETAMINOPHEN 325 MG PO TABS: 650.0000 mg | ORAL_TABLET | Freq: Once | ORAL | Status: DC

## 2023-01-06 MED ORDER — DIPHENHYDRAMINE HCL 25 MG PO CAPS: 50.0000 mg | ORAL_CAPSULE | Freq: Once | ORAL | Status: DC

## 2023-01-06 MED ORDER — DARATUMUMAB-HYALURONIDASE-FIHJ 1800-30000 MG-UT/15ML ~~LOC~~ SOLN
1800.0000 mg | Freq: Once | SUBCUTANEOUS | Status: AC
  Administered 2023-01-06: 1800 mg via SUBCUTANEOUS
  Filled 2023-01-06: qty 15

## 2023-01-06 MED ORDER — DEXTROSE 5 % IV SOLN
70.0000 mg/m2 | Freq: Once | INTRAVENOUS | Status: AC
  Administered 2023-01-06: 140 mg via INTRAVENOUS
  Filled 2023-01-06: qty 60

## 2023-01-06 MED ORDER — HEPARIN SOD (PORK) LOCK FLUSH 100 UNIT/ML IV SOLN: 500.0000 [IU] | Freq: Once | INTRAVENOUS | Status: DC | PRN

## 2023-01-06 MED ORDER — SODIUM CHLORIDE 0.9% FLUSH
10.0000 mL | INTRAVENOUS | Status: DC | PRN
  Administered 2023-01-06: 10 mL

## 2023-01-06 MED ORDER — SODIUM CHLORIDE 0.9 % IV SOLN: Freq: Once | INTRAVENOUS | Status: AC

## 2023-01-06 MED ORDER — DEXAMETHASONE 4 MG PO TABS: 12.0000 mg | ORAL_TABLET | Freq: Once | ORAL | Status: DC

## 2023-01-06 NOTE — Patient Instructions (Signed)

## 2023-01-16 ENCOUNTER — Ambulatory Visit (HOSPITAL_COMMUNITY)
Admission: RE | Admit: 2023-01-16 | Discharge: 2023-01-16 | Disposition: A | Discharge status: Home / Self Care | Payer: Medicare Other | Source: Ambulatory Visit | Attending: Hematology & Oncology | Admitting: Hematology & Oncology

## 2023-01-16 DIAGNOSIS — C50911 Malignant neoplasm of unspecified site of right female breast: Secondary | ICD-10-CM | POA: Insufficient documentation

## 2023-01-20 ENCOUNTER — Encounter: Payer: Self-pay | Admitting: Hematology & Oncology

## 2023-01-20 ENCOUNTER — Inpatient Hospital Stay: Payer: Medicare Other

## 2023-01-20 ENCOUNTER — Inpatient Hospital Stay: Payer: Medicare Other | Admitting: Hematology & Oncology

## 2023-01-20 VITALS — BP 129/83 | HR 89 | Temp 99.2°F | Resp 20 | Ht 58.5 in | Wt 222.8 lb

## 2023-01-20 VITALS — BP 145/72 | HR 78 | Resp 18

## 2023-01-20 DIAGNOSIS — C50911 Malignant neoplasm of unspecified site of right female breast: Secondary | ICD-10-CM | POA: Diagnosis not present

## 2023-01-20 DIAGNOSIS — C9001 Multiple myeloma in remission: Secondary | ICD-10-CM

## 2023-01-20 DIAGNOSIS — Z5111 Encounter for antineoplastic chemotherapy: Secondary | ICD-10-CM | POA: Diagnosis not present

## 2023-01-20 LAB — CBC WITH DIFFERENTIAL (CANCER CENTER ONLY)
Abs Immature Granulocytes: 0.02 10*3/uL (ref 0.00–0.07)
Basophils Absolute: 0 10*3/uL (ref 0.0–0.1)
Basophils Relative: 1 %
Eosinophils Absolute: 0.1 10*3/uL (ref 0.0–0.5)
Eosinophils Relative: 1 %
HCT: 36.5 % (ref 36.0–46.0)
Hemoglobin: 12.2 g/dL (ref 12.0–15.0)
Immature Granulocytes: 0 %
Lymphocytes Relative: 35 %
Lymphs Abs: 1.6 10*3/uL (ref 0.7–4.0)
MCH: 36.1 pg — ABNORMAL HIGH (ref 26.0–34.0)
MCHC: 33.4 g/dL (ref 30.0–36.0)
MCV: 108 fL — ABNORMAL HIGH (ref 80.0–100.0)
Monocytes Absolute: 0.4 10*3/uL (ref 0.1–1.0)
Monocytes Relative: 8 %
Neutro Abs: 2.5 10*3/uL (ref 1.7–7.7)
Neutrophils Relative %: 55 %
Platelet Count: 200 10*3/uL (ref 150–400)
RBC: 3.38 MIL/uL — ABNORMAL LOW (ref 3.87–5.11)
RDW: 15.8 % — ABNORMAL HIGH (ref 11.5–15.5)
WBC Count: 4.7 10*3/uL (ref 4.0–10.5)
nRBC: 0 % (ref 0.0–0.2)

## 2023-01-20 LAB — CMP (CANCER CENTER ONLY)
ALT: 12 U/L (ref 0–44)
AST: 17 U/L (ref 15–41)
Albumin: 4.1 g/dL (ref 3.5–5.0)
Alkaline Phosphatase: 60 U/L (ref 38–126)
Anion gap: 10 (ref 5–15)
BUN: 17 mg/dL (ref 8–23)
CO2: 26 mmol/L (ref 22–32)
Calcium: 9.3 mg/dL (ref 8.9–10.3)
Chloride: 103 mmol/L (ref 98–111)
Creatinine: 1.29 mg/dL — ABNORMAL HIGH (ref 0.44–1.00)
GFR, Estimated: 44 mL/min — ABNORMAL LOW (ref >60)
Glucose, Bld: 101 mg/dL — ABNORMAL HIGH (ref 70–99)
Potassium: 3.9 mmol/L (ref 3.5–5.1)
Sodium: 139 mmol/L (ref 135–145)
Total Bilirubin: 0.9 mg/dL (ref 0.3–1.2)
Total Protein: 5.9 g/dL — ABNORMAL LOW (ref 6.5–8.1)

## 2023-01-20 LAB — LACTATE DEHYDROGENASE: LDH: 197 U/L — ABNORMAL HIGH (ref 98–192)

## 2023-01-20 MED ORDER — SODIUM CHLORIDE 0.9 % IV SOLN: Freq: Once | INTRAVENOUS | Status: AC

## 2023-01-20 MED ORDER — ACETAMINOPHEN 325 MG PO TABS
650.0000 mg | ORAL_TABLET | Freq: Once | ORAL | Status: AC
  Administered 2023-01-20: 650 mg via ORAL
  Filled 2023-01-20: qty 2

## 2023-01-20 MED ORDER — DEXTROSE 5 % IV SOLN
70.0000 mg/m2 | Freq: Once | INTRAVENOUS | Status: AC
  Administered 2023-01-20: 140 mg via INTRAVENOUS
  Filled 2023-01-20: qty 60

## 2023-01-20 MED ORDER — DARATUMUMAB-HYALURONIDASE-FIHJ 1800-30000 MG-UT/15ML ~~LOC~~ SOLN
1800.0000 mg | Freq: Once | SUBCUTANEOUS | Status: AC
  Administered 2023-01-20: 1800 mg via SUBCUTANEOUS
  Filled 2023-01-20: qty 15

## 2023-01-20 MED ORDER — DEXAMETHASONE 4 MG PO TABS
12.0000 mg | ORAL_TABLET | Freq: Once | ORAL | Status: AC
  Administered 2023-01-20: 12 mg via ORAL
  Filled 2023-01-20: qty 3

## 2023-01-20 MED ORDER — DIPHENHYDRAMINE HCL 25 MG PO CAPS
50.0000 mg | ORAL_CAPSULE | Freq: Once | ORAL | Status: AC
  Administered 2023-01-20: 50 mg via ORAL
  Filled 2023-01-20: qty 2

## 2023-01-20 MED ORDER — SODIUM CHLORIDE 0.9% FLUSH
10.0000 mL | INTRAVENOUS | Status: DC | PRN
  Administered 2023-01-20: 10 mL

## 2023-01-20 MED ORDER — SODIUM CHLORIDE 0.9% FLUSH
10.0000 mL | INTRAVENOUS | Status: DC | PRN
  Administered 2023-01-20: 20 mL

## 2023-01-20 NOTE — Patient Instructions (Signed)
[facility] [place]  Discharge Instructions: Thank you for choosing [facility] to provide your oncology and hematology care.   If you have a lab appointment with the [facility], please go directly to the [facility] and check in at the registration area.  Wear comfortable clothing and clothing appropriate for easy access to any Portacath or PICC line.   We strive to give you quality time with your provider. You may need to reschedule your appointment if you arrive late (15 or more minutes).  Arriving late affects you and other patients whose appointments are after yours.  Also, if you miss three or more appointments without notifying the office, you may be dismissed from the clinic at the provider's discretion.      For prescription refill requests, have your pharmacy contact our office and allow 72 hours for refills to be completed.    Today you received the following chemotherapy and/or immunotherapy agents Darzalex-Faspro, Kyprolis.      To help prevent nausea and vomiting after your treatment, we encourage you to take your nausea medication as directed.  BELOW ARE SYMPTOMS THAT SHOULD BE REPORTED IMMEDIATELY: *FEVER GREATER THAN 100.4 F (38 C) OR HIGHER *CHILLS OR SWEATING *NAUSEA AND VOMITING THAT IS NOT CONTROLLED WITH YOUR NAUSEA MEDICATION *UNUSUAL SHORTNESS OF BREATH *UNUSUAL BRUISING OR BLEEDING *URINARY PROBLEMS (pain or burning when urinating, or frequent urination) *BOWEL PROBLEMS (unusual diarrhea, constipation, pain near the anus) TENDERNESS IN MOUTH AND THROAT WITH OR WITHOUT PRESENCE OF ULCERS (sore throat, sores in mouth, or a toothache) UNUSUAL RASH, SWELLING OR PAIN  UNUSUAL VAGINAL DISCHARGE OR ITCHING   Items with * indicate a potential emergency and should be followed up as soon as possible or go to the Emergency Department if any problems should occur.  Please show the CHEMOTHERAPY ALERT CARD or IMMUNOTHERAPY  ALERT CARD at check-in to the Emergency Department and triage nurse. Should you have questions after your visit or need to cancel or reschedule your appointment, please contact [facility]  [phone] and follow the prompts.  Office hours are 8:00 a.m. to 4:30 p.m. Monday - Friday. Please note that voicemails left after 4:00 p.m. may not be returned until the following business day.  We are closed weekends and major holidays. You have access to a nurse at all times for urgent questions. Please call the main number to the clinic [phone] and follow the prompts.  For any non-urgent questions, you may also contact your provider using MyChart. We now offer e-Visits for anyone 48 and older to request care online for non-urgent symptoms. For details visit [email or web address].   Also download the MyChart app! Go to the app store, search "MyChart", open the app, select [facility], and log in with your MyChart username and password.

## 2023-01-20 NOTE — Patient Instructions (Signed)

## 2023-01-20 NOTE — Progress Notes (Signed)
Hematology and Oncology Follow Up Visit  Marcia [name] [id number] May 19, 1951 72 y.o. 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  She feels well.  She really has had no specific complaints.  We did do a CT scan on her.  This was done on 01/16/2023.  The CT scan did show some thing that was concerning.  She had these expansile soft tissue lesions in the manubrium, left lateral sixth rib, left iliac bone.  I do not know if this is from her myeloma or this is from her breast cancer that is recurred.  Will get have to get a biopsy.  Of note, her last CA 27.29 was 58.  T has now been about 4 years that she has had any treatment for her breast cancer.  Of note, when she had her lumpectomy, there is no active disease noted.  She is doing well with the myeloma.  Her last Kappa light chain was down to 12.4 mg/dL.  I think she is tolerated treatment pretty well.  She actually looks quite good.  She feels pretty good.  She really has had no specific complaints.  I think she is tolerated treatment nicely.  Overall, I would say that her performance status is probably ECOG 1.   Medications:  Allergies as of 01/20/2023       Reactions   Heparin Other (See  Comments)   HIT   Sulfamethoxazole-trimethoprim Other (See Comments), Rash   ? Renal failure.   Zofran [ondansetron] Shortness Of Breath   "felt like throat was closing"   Clarithromycin Diarrhea, Nausea And Vomiting   Latex Rash   Macrodantin [nitrofurantoin] Rash        Medication List        Accurate as of January 20, 2023 12:14 PM. If you have any questions, ask your nurse or doctor.          STOP taking these medications    methylPREDNISolone 4 MG Tbpk tablet Commonly known as: MEDROL DOSEPAK Stopped by: [name], MD       TAKE these medications    acetaminophen 325 MG tablet Commonly known as: TYLENOL Take 650 mg by mouth daily as needed for moderate pain or headache.   dexamethasone 6 MG tablet Commonly known as: DECADRON Take 2 tablets (12 mg total) by mouth daily. Take 30 minutes prior to treatment.   Eliquis 2.5 MG Tabs tablet Generic drug: apixaban TAKE 1 TABLET BY MOUTH TWICE A DAY   famciclovir 250 MG tablet Commonly known as: FAMVIR TAKE 1 TABLET BY MOUTH EVERY DAY   feeding supplement Liqd Take 237 mLs by mouth 3 (three) times daily between meals. What changed: when to take this   folic acid 1 MG tablet Commonly known as: FOLVITE TAKE 2 TABLETS (  2 MG TOTAL) BY MOUTH DAILY.   LORazepam 0.5 MG tablet Commonly known as: ATIVAN Take 1 tablet by mouth daily as needed.   losartan 25 MG tablet Commonly known as: COZAAR Take 25 mg by mouth daily.   montelukast 10 MG tablet Commonly known as: Singulair Take 1 tablet (10 mg total) by mouth at bedtime.   NON FORMULARY Nerve Savior 2 capsules daily   prochlorperazine 10 MG tablet Commonly known as: COMPAZINE Take 1 tablet (10 mg total) by mouth every 6 (six) hours as needed for nausea or vomiting.   traMADol 50 MG tablet Commonly known as: ULTRAM Take 1 tablet by mouth every 12 (twelve) hours as needed.   VITAMIN E PO daily.        Allergies:  Allergies  Allergen  Reactions   Heparin Other (See Comments)    HIT   Sulfamethoxazole-Trimethoprim Other (See Comments) and Rash    ? Renal failure.   Zofran [Ondansetron] Shortness Of Breath    "felt like throat was closing"   Clarithromycin Diarrhea and Nausea And Vomiting   Latex Rash   Macrodantin [Nitrofurantoin] Rash    Past Medical History, Surgical history, Social history, and Family History were reviewed and updated.  Review of Systems: Review of Systems  Constitutional: Negative.   HENT: Negative.    Eyes: Negative.   Respiratory: Negative.    Cardiovascular: Negative.   Gastrointestinal: Negative.   Genitourinary: Negative.   Musculoskeletal: Negative.   Skin: Negative.   Neurological: Negative.   Endo/Heme/Allergies: Negative.   Psychiatric/Behavioral: Negative.        Physical Exam:  height is 4' 10.5" (1.486 m) and weight is 222 lb 12.8 oz (101.1 kg). Her oral temperature is 99.2 F (37.3 C). Her blood pressure is 129/83 and her pulse is 89. Her respiration is 20 and oxygen saturation is 96%.   Wt Readings from Last 3 Encounters:  01/20/23 222 lb 12.8 oz (101.1 kg)  12/23/22 223 lb (101.2 kg)  11/25/22 225 lb (102.1 kg)    Physical Exam Vitals reviewed.  HENT:     Head: Normocephalic and atraumatic.  Eyes:     Pupils: Pupils are equal, round, and reactive to light.  Cardiovascular:     Rate and Rhythm: Normal rate and regular rhythm.     Heart sounds: Normal heart sounds.  Pulmonary:     Effort: Pulmonary effort is normal.     Breath sounds: Normal breath sounds.  Abdominal:     General: Bowel sounds are normal.     Palpations: Abdomen is soft.  Musculoskeletal:        General: No tenderness or deformity. Normal range of motion.     Cervical back: Normal range of motion.  Lymphadenopathy:     Cervical: No cervical adenopathy.  Skin:    General: Skin is warm and dry.     Findings: No erythema or rash.  Neurological:     Mental Status: She is alert and  oriented to person, place, and time.  Psychiatric:        Behavior: Behavior normal.        Thought Content: Thought content normal.        Judgment: Judgment normal.     Lab Results  Component Value Date   WBC 4.7 01/20/2023   HGB 12.2 01/20/2023   HCT 36.5 01/20/2023   MCV 108.0 (H) 01/20/2023   PLT 200 01/20/2023   Lab Results  Component Value Date   FERRITIN 820 (  H) 05/17/2018   IRON 272 (H) 05/17/2018   TIBC 295 05/17/2018   UIBC 23 05/17/2018   IRONPCTSAT 92 (H) 05/17/2018   Lab Results  Component Value Date   RETICCTPCT 1.3 03/27/2011   RBC 3.38 (L) 01/20/2023   RETICCTABS 56.4 03/27/2011   Lab Results  Component Value Date   KPAFRELGTCHN 123.6 (H) 12/23/2022   LAMBDASER 5.3 (L) 12/23/2022   KAPLAMBRATIO 23.32 (H) 12/23/2022   Lab Results  Component Value Date   IGGSERUM 293 (L) 12/23/2022   IGA 11 (L) 12/23/2022   IGMSERUM 11 (L) 12/23/2022   Lab Results  Component Value Date   TOTALPROTELP 5.5 (L) 12/23/2022   ALBUMINELP 3.3 12/23/2022   A1GS 0.2 12/23/2022   A2GS 0.7 12/23/2022   BETS 1.0 12/23/2022   BETA2SER 0.4 07/02/2015   GAMS 0.3 (L) 12/23/2022   MSPIKE Not Observed 12/23/2022   SPEI Comment 10/02/2022     Chemistry      Component Value Date/Time   NA 140 01/06/2023 0956   NA 141 07/06/2017 0816   NA 141 01/05/2017 0956   K 4.0 01/06/2023 0956   K 3.6 07/06/2017 0816   K 4.3 01/05/2017 0956   CL 103 01/06/2023 0956   CL 101 07/06/2017 0816   CO2 26 01/06/2023 0956   CO2 28 07/06/2017 0816   CO2 23 01/05/2017 0956   BUN 23 01/06/2023 0956   BUN 28 (H) 07/06/2017 0816   BUN 22.4 01/05/2017 0956   CREATININE 1.20 (H) 01/06/2023 0956   CREATININE 1.6 (H) 07/06/2017 0816   CREATININE 1.4 (H) 01/05/2017 0956      Component Value Date/Time   CALCIUM 9.6 01/06/2023 0956   CALCIUM 9.1 07/06/2017 0816   CALCIUM 9.3 01/05/2017 0956   ALKPHOS 65 01/06/2023 0956   ALKPHOS 58 07/06/2017 0816   ALKPHOS 52 01/05/2017 0956   AST 15  01/06/2023 0956   AST 14 01/05/2017 0956   ALT 17 01/06/2023 0956   ALT 58 (H) 07/06/2017 0816   ALT 13 01/05/2017 0956   BILITOT 0.9 01/06/2023 0956   BILITOT 0.56 01/05/2017 0956       Impression and Plan: Marcia [name] is a very pleasant 72 yo caucasian female with kappa light chain myeloma that has recurred again.    We will go ahead with her 4th cycle of treatment.  Hopefully, we will see that her kappa light chains are still coming down nicely.  I do worry about these bony issues.  Again we will go ahead and get a biopsy.  We will see what Interventional radiology can do for [place] with respect to a biopsy.  I talked to her about all this.  She still has a strong faith.  I am very impressed with her stamina and her faith.  The big news is that a son is getting married in August.  She, and, want to make sure that she is going to be able to enjoy the wedding.  We will plan to get her back here in another month, or sooner depending on the biopsy.     [name], MD 2/27/202412:14 PM

## 2023-01-21 ENCOUNTER — Other Ambulatory Visit (HOSPITAL_COMMUNITY): Payer: Medicare Other

## 2023-01-21 ENCOUNTER — Other Ambulatory Visit: Payer: Self-pay

## 2023-01-21 LAB — KAPPA/LAMBDA LIGHT CHAINS
Kappa free light chain: 120.1 mg/L — ABNORMAL HIGH (ref 3.3–19.4)
Kappa, lambda light chain ratio: 34.31 — ABNORMAL HIGH (ref 0.26–1.65)
Lambda free light chains: 3.5 mg/L — ABNORMAL LOW (ref 5.7–26.3)

## 2023-01-22 ENCOUNTER — Encounter: Payer: Self-pay | Admitting: *Deleted

## 2023-01-22 LAB — IGG, IGA, IGM
IgA: 10 mg/dL — ABNORMAL LOW (ref 64–422)
IgG (Immunoglobin G), Serum: 269 mg/dL — ABNORMAL LOW (ref 586–1602)
IgM (Immunoglobulin M), Srm: 12 mg/dL — ABNORMAL LOW (ref 26–217)

## 2023-01-22 NOTE — Progress Notes (Signed)
Sandi Mariscal, MD  Donita Brooks D OK for CT guided L iliac lesion Bx.  CT CAP - image 79, series 2.  Cathren Harsh

## 2023-01-22 NOTE — Progress Notes (Signed)
Order received from Dr. [name] for pt to hold Eliquis for two days prior to biopsy and restart Eliquis the day after biopsy if no bleeding. Patient notified per central scheduling.

## 2023-01-24 ENCOUNTER — Other Ambulatory Visit: Payer: Self-pay

## 2023-01-27 ENCOUNTER — Inpatient Hospital Stay: Payer: Medicare Other

## 2023-01-27 ENCOUNTER — Inpatient Hospital Stay: Payer: Medicare Other | Attending: Hematology & Oncology

## 2023-01-27 VITALS — BP 157/77 | HR 85 | Temp 98.3°F | Resp 18

## 2023-01-27 DIAGNOSIS — Z171 Estrogen receptor negative status [ER-]: Secondary | ICD-10-CM | POA: Insufficient documentation

## 2023-01-27 DIAGNOSIS — Z9484 Stem cells transplant status: Secondary | ICD-10-CM | POA: Diagnosis not present

## 2023-01-27 DIAGNOSIS — Z7901 Long term (current) use of anticoagulants: Secondary | ICD-10-CM | POA: Diagnosis not present

## 2023-01-27 DIAGNOSIS — Z86711 Personal history of pulmonary embolism: Secondary | ICD-10-CM | POA: Diagnosis not present

## 2023-01-27 DIAGNOSIS — Z86718 Personal history of other venous thrombosis and embolism: Secondary | ICD-10-CM | POA: Diagnosis not present

## 2023-01-27 DIAGNOSIS — C7951 Secondary malignant neoplasm of bone: Secondary | ICD-10-CM | POA: Diagnosis not present

## 2023-01-27 DIAGNOSIS — Z5112 Encounter for antineoplastic immunotherapy: Secondary | ICD-10-CM | POA: Diagnosis not present

## 2023-01-27 DIAGNOSIS — C9000 Multiple myeloma not having achieved remission: Secondary | ICD-10-CM | POA: Insufficient documentation

## 2023-01-27 DIAGNOSIS — C50911 Malignant neoplasm of unspecified site of right female breast: Secondary | ICD-10-CM | POA: Insufficient documentation

## 2023-01-27 DIAGNOSIS — C9001 Multiple myeloma in remission: Secondary | ICD-10-CM

## 2023-01-27 DIAGNOSIS — Z5111 Encounter for antineoplastic chemotherapy: Secondary | ICD-10-CM | POA: Diagnosis present

## 2023-01-27 LAB — CBC WITH DIFFERENTIAL (CANCER CENTER ONLY)
Abs Immature Granulocytes: 0.05 10*3/uL (ref 0.00–0.07)
Basophils Absolute: 0 10*3/uL (ref 0.0–0.1)
Basophils Relative: 0 %
Eosinophils Absolute: 0.1 10*3/uL (ref 0.0–0.5)
Eosinophils Relative: 1 %
HCT: 35.2 % — ABNORMAL LOW (ref 36.0–46.0)
Hemoglobin: 11.7 g/dL — ABNORMAL LOW (ref 12.0–15.0)
Immature Granulocytes: 1 %
Lymphocytes Relative: 30 %
Lymphs Abs: 1.9 10*3/uL (ref 0.7–4.0)
MCH: 36.2 pg — ABNORMAL HIGH (ref 26.0–34.0)
MCHC: 33.2 g/dL (ref 30.0–36.0)
MCV: 109 fL — ABNORMAL HIGH (ref 80.0–100.0)
Monocytes Absolute: 0.7 10*3/uL (ref 0.1–1.0)
Monocytes Relative: 11 %
Neutro Abs: 3.6 10*3/uL (ref 1.7–7.7)
Neutrophils Relative %: 57 %
Platelet Count: 128 10*3/uL — ABNORMAL LOW (ref 150–400)
RBC: 3.23 MIL/uL — ABNORMAL LOW (ref 3.87–5.11)
RDW: 15.6 % — ABNORMAL HIGH (ref 11.5–15.5)
WBC Count: 6.3 10*3/uL (ref 4.0–10.5)
nRBC: 0.3 % — ABNORMAL HIGH (ref 0.0–0.2)

## 2023-01-27 LAB — CMP (CANCER CENTER ONLY)
ALT: 15 U/L (ref 0–44)
AST: 14 U/L — ABNORMAL LOW (ref 15–41)
Albumin: 4.1 g/dL (ref 3.5–5.0)
Alkaline Phosphatase: 65 U/L (ref 38–126)
Anion gap: 8 (ref 5–15)
BUN: 19 mg/dL (ref 8–23)
CO2: 27 mmol/L (ref 22–32)
Calcium: 9.2 mg/dL (ref 8.9–10.3)
Chloride: 105 mmol/L (ref 98–111)
Creatinine: 1.26 mg/dL — ABNORMAL HIGH (ref 0.44–1.00)
GFR, Estimated: 45 mL/min — ABNORMAL LOW (ref >60)
Glucose, Bld: 100 mg/dL — ABNORMAL HIGH (ref 70–99)
Potassium: 3.7 mmol/L (ref 3.5–5.1)
Sodium: 140 mmol/L (ref 135–145)
Total Bilirubin: 0.9 mg/dL (ref 0.3–1.2)
Total Protein: 6 g/dL — ABNORMAL LOW (ref 6.5–8.1)

## 2023-01-27 LAB — PROTEIN ELECTROPHORESIS, SERUM, WITH REFLEX
A/G Ratio: 1.5 (ref 0.7–1.7)
Albumin ELP: 3.4 g/dL (ref 2.9–4.4)
Alpha-1-Globulin: 0.2 g/dL (ref 0.0–0.4)
Alpha-2-Globulin: 0.7 g/dL (ref 0.4–1.0)
Beta Globulin: 0.9 g/dL (ref 0.7–1.3)
Gamma Globulin: 0.3 g/dL — ABNORMAL LOW (ref 0.4–1.8)
Globulin, Total: 2.2 g/dL (ref 2.2–3.9)
M-Spike, %: 0.1 g/dL — ABNORMAL HIGH
SPEP Interpretation: 0
Total Protein ELP: 5.6 g/dL — ABNORMAL LOW (ref 6.0–8.5)

## 2023-01-27 LAB — IMMUNOFIXATION REFLEX, SERUM
IgA: 6 mg/dL — ABNORMAL LOW (ref 64–422)
IgG (Immunoglobin G), Serum: 329 mg/dL — ABNORMAL LOW (ref 586–1602)
IgM (Immunoglobulin M), Srm: 11 mg/dL — ABNORMAL LOW (ref 26–217)

## 2023-01-27 MED ORDER — DEXTROSE 5 % IV SOLN
70.0000 mg/m2 | Freq: Once | INTRAVENOUS | Status: AC
  Administered 2023-01-27: 140 mg via INTRAVENOUS
  Filled 2023-01-27: qty 60

## 2023-01-27 MED ORDER — DEXAMETHASONE 4 MG PO TABS
12.0000 mg | ORAL_TABLET | Freq: Once | ORAL | Status: AC
  Administered 2023-01-27: 12 mg via ORAL
  Filled 2023-01-27: qty 3

## 2023-01-27 MED ORDER — SODIUM CHLORIDE 0.9% FLUSH
10.0000 mL | INTRAVENOUS | Status: DC | PRN
  Administered 2023-01-27: 20 mL

## 2023-01-27 MED ORDER — SODIUM CHLORIDE 0.9 % IV SOLN: Freq: Once | INTRAVENOUS | Status: AC

## 2023-01-27 MED ORDER — SODIUM CHLORIDE 0.9 % IV SOLN: Freq: Once | INTRAVENOUS | Status: DC

## 2023-01-27 NOTE — Patient Instructions (Signed)
[facility]  Discharge Instructions: Thank you for choosing [facility] to provide your oncology and hematology care.   If you have a lab appointment with the [facility], please go directly to the [facility] and check in at the registration area.  Wear comfortable clothing and clothing appropriate for easy access to any Portacath or PICC line.   We strive to give you quality time with your provider. You may need to reschedule your appointment if you arrive late (15 or more minutes).  Arriving late affects you and other patients whose appointments are after yours.  Also, if you miss three or more appointments without notifying the office, you may be dismissed from the clinic at the provider's discretion.      For prescription refill requests, have your pharmacy contact our office and allow 72 hours for refills to be completed.    Today you received the following chemotherapy and/or immunotherapy agents:  Kyprolis      To help prevent nausea and vomiting after your treatment, we encourage you to take your nausea medication as directed.  BELOW ARE SYMPTOMS THAT SHOULD BE REPORTED IMMEDIATELY: *FEVER GREATER THAN 100.4 F (38 C) OR HIGHER *CHILLS OR SWEATING *NAUSEA AND VOMITING THAT IS NOT CONTROLLED WITH YOUR NAUSEA MEDICATION *UNUSUAL SHORTNESS OF BREATH *UNUSUAL BRUISING OR BLEEDING *URINARY PROBLEMS (pain or burning when urinating, or frequent urination) *BOWEL PROBLEMS (unusual diarrhea, constipation, pain near the anus) TENDERNESS IN MOUTH AND THROAT WITH OR WITHOUT PRESENCE OF ULCERS (sore throat, sores in mouth, or a toothache) UNUSUAL RASH, SWELLING OR PAIN  UNUSUAL VAGINAL DISCHARGE OR ITCHING   Items with * indicate a potential emergency and should be followed up as soon as possible or go to the Emergency Department if any problems should occur.  Please show the CHEMOTHERAPY ALERT CARD or IMMUNOTHERAPY ALERT CARD at  check-in to the Emergency Department and triage nurse. Should you have questions after your visit or need to cancel or reschedule your appointment, please contact [facility]  [phone] and follow the prompts.  Office hours are 8:00 a.m. to 4:30 p.m. Monday - Friday. Please note that voicemails left after 4:00 p.m. may not be returned until the following business day.  We are closed weekends and major holidays. You have access to a nurse at all times for urgent questions. Please call the main number to the clinic [phone] and follow the prompts.  For any non-urgent questions, you may also contact your provider using MyChart. We now offer e-Visits for anyone 50 and older to request care online for non-urgent symptoms. For details visit [email or web address].   Also download the MyChart app! Go to the app store, search "MyChart", open the app, select [facility], and log in with your MyChart username and password.

## 2023-01-27 NOTE — Patient Instructions (Signed)

## 2023-01-30 ENCOUNTER — Other Ambulatory Visit: Payer: Self-pay | Admitting: Student

## 2023-01-30 DIAGNOSIS — C9001 Multiple myeloma in remission: Secondary | ICD-10-CM

## 2023-01-30 DIAGNOSIS — D6959 Other secondary thrombocytopenia: Secondary | ICD-10-CM

## 2023-02-02 ENCOUNTER — Encounter (HOSPITAL_COMMUNITY): Payer: Self-pay

## 2023-02-02 ENCOUNTER — Ambulatory Visit (HOSPITAL_COMMUNITY)
Admission: RE | Admit: 2023-02-02 | Discharge: 2023-02-02 | Disposition: A | Discharge status: Home / Self Care | Payer: Medicare Other | Source: Ambulatory Visit | Attending: Hematology & Oncology | Admitting: Hematology & Oncology

## 2023-02-02 DIAGNOSIS — Z853 Personal history of malignant neoplasm of breast: Secondary | ICD-10-CM | POA: Diagnosis not present

## 2023-02-02 DIAGNOSIS — Z7901 Long term (current) use of anticoagulants: Secondary | ICD-10-CM | POA: Insufficient documentation

## 2023-02-02 DIAGNOSIS — C50911 Malignant neoplasm of unspecified site of right female breast: Secondary | ICD-10-CM

## 2023-02-02 DIAGNOSIS — Z8673 Personal history of transient ischemic attack (TIA), and cerebral infarction without residual deficits: Secondary | ICD-10-CM | POA: Insufficient documentation

## 2023-02-02 DIAGNOSIS — C9000 Multiple myeloma not having achieved remission: Secondary | ICD-10-CM | POA: Insufficient documentation

## 2023-02-02 DIAGNOSIS — Z86718 Personal history of other venous thrombosis and embolism: Secondary | ICD-10-CM | POA: Diagnosis not present

## 2023-02-02 DIAGNOSIS — C9001 Multiple myeloma in remission: Secondary | ICD-10-CM

## 2023-02-02 DIAGNOSIS — N189 Chronic kidney disease, unspecified: Secondary | ICD-10-CM | POA: Insufficient documentation

## 2023-02-02 DIAGNOSIS — Z86711 Personal history of pulmonary embolism: Secondary | ICD-10-CM | POA: Insufficient documentation

## 2023-02-02 DIAGNOSIS — D6959 Other secondary thrombocytopenia: Secondary | ICD-10-CM

## 2023-02-02 DIAGNOSIS — I129 Hypertensive chronic kidney disease with stage 1 through stage 4 chronic kidney disease, or unspecified chronic kidney disease: Secondary | ICD-10-CM | POA: Insufficient documentation

## 2023-02-02 LAB — CBC
HCT: 33.8 % — ABNORMAL LOW (ref 36.0–46.0)
Hemoglobin: 11.1 g/dL — ABNORMAL LOW (ref 12.0–15.0)
MCH: 36.6 pg — ABNORMAL HIGH (ref 26.0–34.0)
MCHC: 32.8 g/dL (ref 30.0–36.0)
MCV: 111.6 fL — ABNORMAL HIGH (ref 80.0–100.0)
Platelets: 115 10*3/uL — ABNORMAL LOW (ref 150–400)
RBC: 3.03 MIL/uL — ABNORMAL LOW (ref 3.87–5.11)
RDW: 15.8 % — ABNORMAL HIGH (ref 11.5–15.5)
WBC: 6.2 10*3/uL (ref 4.0–10.5)
nRBC: 0.5 % — ABNORMAL HIGH (ref 0.0–0.2)

## 2023-02-02 LAB — PROTIME-INR
INR: 1 (ref 0.8–1.2)
Prothrombin Time: 12.9 seconds (ref 11.4–15.2)

## 2023-02-02 MED ORDER — SODIUM CHLORIDE 0.9 % IV SOLN: INTRAVENOUS | Status: DC

## 2023-02-02 MED ORDER — SODIUM CHLORIDE 0.9% FLUSH: 10.0000 mL | Freq: Two times a day (BID) | INTRAVENOUS | Status: DC

## 2023-02-02 MED ORDER — MIDAZOLAM HCL 2 MG/2ML IJ SOLN
INTRAMUSCULAR | Status: AC | PRN
  Administered 2023-02-02: 1 mg via INTRAVENOUS
  Administered 2023-02-02 (×2): .5 mg via INTRAVENOUS

## 2023-02-02 MED ORDER — HYDRALAZINE HCL 25 MG PO TABS
25.0000 mg | ORAL_TABLET | Freq: Once | ORAL | Status: AC
  Administered 2023-02-02: 25 mg via ORAL
  Filled 2023-02-02: qty 1

## 2023-02-02 MED ORDER — FENTANYL CITRATE (PF) 100 MCG/2ML IJ SOLN
INTRAMUSCULAR | Status: AC
  Filled 2023-02-02: qty 4

## 2023-02-02 MED ORDER — MIDAZOLAM HCL 2 MG/2ML IJ SOLN
INTRAMUSCULAR | Status: AC
  Filled 2023-02-02: qty 4

## 2023-02-02 MED ORDER — SODIUM CHLORIDE 0.9% FLUSH: 10.0000 mL | INTRAVENOUS | Status: DC | PRN

## 2023-02-02 MED ORDER — LIDOCAINE HCL 1 % IJ SOLN: 10.0000 mL | Freq: Once | INTRAMUSCULAR | Status: DC

## 2023-02-02 MED ORDER — FENTANYL CITRATE (PF) 100 MCG/2ML IJ SOLN
INTRAMUSCULAR | Status: AC | PRN
  Administered 2023-02-02 (×2): 25 ug via INTRAVENOUS
  Administered 2023-02-02: 50 ug via INTRAVENOUS

## 2023-02-02 NOTE — Progress Notes (Signed)
Patients [name] is 181/90. Marcia [name], [place] made aware. Awaiting further orders.

## 2023-02-02 NOTE — H&P (Signed)
Chief Complaint: Left iliac bone lesion  Referring Physician(s): Ennever  Supervising Physician: Aletta Edouard  Patient Status: [facility] - Out-pt  History of Present Illness: Marcia [name] is a 72 y.o. female with history of breast cancer and multiple myeloma.  She was treated with lumpectomy and radioactive seed implantation and with chemotherapy.  CT scan done 01/16/23 showed= Expansile lucent soft tissue lesions in the manubrium, left lateral sixth rib, and left iliac bone compatible with osseous metastatic disease.  She has h/o DVT/ PE and takes Eliquis. She has held this medication appropriately, however she misunderstood and hasn't taken ANY of her medications in 2 days.  Her blood pressure is elevated this morning because she has not taken her blood pressure meds.  I have ordered one time dose of Hydralazine.  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   Personal history of chemotherapy    Personal history of radiation therapy    Primary cancer of right breast without evidence of regional lymph node metastasis (N0) ([facility]) 02/01/2018   Pulmonary embolism (HCC)    lung   TIA (transient ischemic attack) 05/09/2018   [facility]    Past Surgical History:  Procedure Laterality Date   ABDOMINAL HYSTERECTOMY     partial   APPENDECTOMY     BREAST LUMPECTOMY Right    BREAST LUMPECTOMY WITH RADIOACTIVE SEED AND SENTINEL LYMPH NODE BIOPSY Right 07/05/2018   Procedure: RIGHT BREAST LUMPECTOMY WITH RADIOACTIVE SEED AND SENTINEL LYMPH NODE BIOPSY,INJECT BLUE DYE RIGHT BREAST;  Surgeon: Fanny Skates, MD;  Location: [facility];  Service: General;  Laterality: Right;   BREAST SURGERY     biopsy    IR IVC FILTER PLMT / S&I /IMG GUID/MOD SED  06/29/2018   IR IVC FILTER RETRIEVAL / S&I /IMG GUID/MOD SED  10/26/2018   IR RADIOLOGIST EVAL & MGMT  08/31/2018   PORTACATH PLACEMENT Left 02/12/2018   Procedure: INSERTION PORT-A-CATH;  Surgeon: Fanny Skates, MD;  Location: [facility];  Service: General;  Laterality: Left;   TUBAL LIGATION      Allergies: Heparin, Sulfamethoxazole-trimethoprim, Zofran [ondansetron], Clarithromycin, Latex, and Macrodantin [nitrofurantoin]  Medications: Prior to Admission medications   Medication Sig Start Date End Date Taking? Authorizing Provider  acetaminophen (TYLENOL) 325 MG tablet Take 650 mg by mouth daily as needed for moderate pain or headache.   Yes [provider]  dexamethasone (DECADRON) 6 MG tablet Take 2 tablets (12 mg total) by mouth daily. Take 30 minutes prior to treatment. 10/29/22  Yes [name], MD  famciclovir (FAMVIR) 250 MG tablet TAKE 1 TABLET BY MOUTH EVERY DAY 10/09/22  Yes Ennever, Rudell Cobb, MD  feeding supplement, ENSURE ENLIVE, (ENSURE ENLIVE) LIQD Take 237 mLs by mouth 3 (three) times daily between meals. Patient taking differently: Take 237 mLs by mouth 2 (two) times daily. 05/22/18  Yes Oretha Milch D, MD  folic acid (FOLVITE) 1 MG tablet TAKE 2 TABLETS (2 MG TOTAL) BY MOUTH DAILY. 07/05/21  Yes [name], MD  losartan (COZAAR) 25 MG tablet Take 25 mg by mouth daily.   Yes [provider]  montelukast (  SINGULAIR) 10 MG tablet Take 1 tablet (10 mg total) by mouth at bedtime. 12/02/22  Yes Ennever, Rudell Cobb, MD  NON FORMULARY Nerve Savior 2 capsules daily   Yes [provider]  prochlorperazine (COMPAZINE) 10 MG tablet Take 1 tablet (10 mg total) by mouth every 6 (six) hours as needed for nausea or vomiting. 10/14/22  Yes Ennever, Rudell Cobb, MD  traMADol (ULTRAM) 50 MG tablet Take 1 tablet by mouth every 12 (twelve) hours as needed.   Yes [provider]  VITAMIN E PO daily.   Yes [provider]  ELIQUIS 2.5 MG TABS tablet TAKE 1 TABLET BY MOUTH TWICE A DAY 10/09/22   [name], MD  LORazepam (ATIVAN) 0.5 MG tablet Take 1 tablet by mouth daily as needed.    [provider]     Family History  Problem Relation Age of Onset   COPD Mother    COPD Father    Breast cancer Neg Hx     Social History   Socioeconomic History   Marital status: Married    Spouse name: Not on file   Number of children: Not on file   Years of education: Not on file   Highest education level: Not on file  Occupational History   Not on file  Tobacco Use   Smoking status: Never   Smokeless tobacco: Never   Tobacco comments:    never used product  Vaping Use   Vaping Use: Never used  Substance and Sexual Activity   Alcohol use: Never    Alcohol/week: 0.0 standard drinks of alcohol   Drug use: Never   Sexual activity: Not Currently  Other Topics Concern   Not on file  Social History Narrative   Not on file   Social Determinants of Health   Financial Resource Strain: Not on file  Food Insecurity: Not on file  Transportation Needs: Not on file  Physical Activity: Not on file  Stress: Not on file  Social Connections: Not on file     Review of Systems: A 12 point ROS discussed and pertinent positives are indicated in the HPI above.  All other systems are negative.  Review of Systems  Vital Signs: BP (!) 181/90   Pulse 89   Temp 98.3 F (36.8 C) (Oral)   Resp 18   Ht 4' 10.5" (1.486 m)   Wt 222 lb (100.7 kg)   SpO2 96%   BMI 45.61 kg/m   Physical Exam Vitals reviewed.  Constitutional:      Appearance: Normal appearance.  HENT:     Head: Normocephalic and atraumatic.  Eyes:     Extraocular Movements: Extraocular movements intact.  Cardiovascular:     Rate and Rhythm: Normal rate and regular rhythm.  Pulmonary:     Effort: Pulmonary effort is normal. No respiratory distress.     Breath sounds: Normal breath sounds.  Abdominal:      Palpations: Abdomen is soft.  Musculoskeletal:        General: Normal range of motion.     Cervical back: Normal range of motion.  Skin:    General: Skin is warm and dry.  Neurological:     General: No focal deficit present.     Mental Status: She is alert and oriented to person, place, and time.  Psychiatric:        Mood and Affect: Mood normal.        Behavior: Behavior normal.  Thought Content: Thought content normal.        Judgment: Judgment normal.     Imaging: CT CHEST ABDOMEN PELVIS WO CONTRAST  Result Date: 01/17/2023 CLINICAL DATA:  History of stage II right-sided breast cancer rising CA 27/29. * Tracking Code: BO * EXAM: CT CHEST, ABDOMEN AND PELVIS WITHOUT CONTRAST TECHNIQUE: Multidetector CT imaging of the chest, abdomen and pelvis was performed following the standard protocol without IV contrast. RADIATION DOSE REDUCTION: This exam was performed according to the departmental dose-optimization program which includes automated exposure control, adjustment of the mA and/or kV according to patient size and/or use of iterative reconstruction technique. COMPARISON:  Multiple priors including CT August 18, 2018. FINDINGS: CT CHEST FINDINGS Cardiovascular: Left chest Port-A-Cath with tip at the superior cavoatrial junction. Normal caliber abdominal aorta. Normal caliber central pulmonary arteries. Scattered coronary artery calcifications. Normal size heart. No significant pericardial effusion/thickening. Mediastinum/Nodes: No suspicious thyroid nodule. No pathologically enlarged mediastinal, hilar or axillary lymph nodes, noting limited sensitivity for the detection of hilar adenopathy on this noncontrast study. The esophagus is grossly unremarkable. Lungs/Pleura: Stable calcified right middle lobe pulmonary nodule measuring 6 mm on image 74/3. Scattered areas of subsegmental atelectasis/scarring. Subpleural 4 mm pulmonary nodule in the left lung base on image 87/3 common not  definitely seen on prior imaging however this area was somewhat obscured by atelectasis on that study. No pleural effusion. No pneumothorax. Musculoskeletal: Expansile lucent soft tissue lesion in the manubrium with cortical break for instance on image 17/2 and 120/6. Expansile lucent lesion in the left lateral sixth rib on image 53/3. Surgical clips in the right breast. CT ABDOMEN PELVIS FINDINGS Hepatobiliary: Unremarkable noncontrast enhanced appearance of the hepatic parenchyma. Gallbladder is unremarkable. No biliary ductal dilation. Pancreas: No pancreatic ductal dilation or evidence of acute inflammation. Spleen: No splenomegaly. Adrenals/Urinary Tract: Bilateral adrenal glands appear normal. No hydronephrosis. Urinary bladder is minimally distended limiting evaluation. Stomach/Bowel: Stomach is minimally distended limiting evaluation. No pathologic dilation of small or large bowel. Colonic diverticulosis without findings of acute diverticulitis Vascular/Lymphatic: Aortic atherosclerosis. Smooth IVC contours. No pathologically enlarged mediastinal or hilar lymph nodes. Reproductive: Status post hysterectomy. No adnexal masses. Other: No significant abdominopelvic free fluid. Subcutaneous nodularity in the anterior abdominal wall commonly reflect sequela subcutaneous injections. Musculoskeletal: Lucent lesion in the left iliac bone with associated cortical break on image 79/2. Sclerotic focus in the right inter trochanteric femur on image 114/2. IMPRESSION: 1. Expansile lucent soft tissue lesions in the manubrium, left lateral sixth rib, and left iliac bone compatible with osseous metastatic disease. 2. Subpleural 4 mm pulmonary nodule in the left lung base, not definitely seen on prior imaging however this area was somewhat obscured by atelectasis on that study. Nonspecific suggest attention on short-term interval follow-up dedicated chest CT. 3. No evidence of abdominopelvic non osseous metastatic disease on  this nonenhanced CT. 4. Colonic diverticulosis without findings of acute diverticulitis. 5.  Aortic Atherosclerosis (ICD10-I70.0). Electronically Signed   By: Dahlia Bailiff M.D.   On: 01/17/2023 07:10    Labs:  CBC: Recent Labs    12/30/22 1028 01/06/23 0956 01/20/23 1124 01/27/23 1015  WBC 4.7 6.2 4.7 6.3  HGB 12.1 12.4 12.2 11.7*  HCT 37.3 37.2 36.5 35.2*  PLT 108* 160 200 128*    COAGS: No results for input(s): "INR", "APTT" in the last 8760 hours.  BMP: Recent Labs    12/30/22 1028 01/06/23 0956 01/20/23 1124 01/27/23 1015  NA 139 140 139 140  K 3.9 4.0 3.9  3.7  CL 105 103 103 105  CO2 '25 26 26 27  '$ GLUCOSE 95 102* 101* 100*  BUN '19 23 17 19  '$ CALCIUM 9.0 9.6 9.3 9.2  CREATININE 1.27* 1.20* 1.29* 1.26*  GFRNONAA 45* 48* 44* 45*    LIVER FUNCTION TESTS: Recent Labs    12/30/22 1028 01/06/23 0956 01/20/23 1124 01/27/23 1015  BILITOT 0.9 0.9 0.9 0.9  AST '19 15 17 '$ 14*  ALT '22 17 12 15  '$ ALKPHOS 66 65 60 65  PROT 6.1* 6.3* 5.9* 6.0*  ALBUMIN 4.0 4.1 4.1 4.1    TUMOR MARKERS: No results for input(s): "AFPTM", "CEA", "CA199", "CHROMGRNA" in the last 8760 hours.  Assessment and Plan:  Left iliac bone lesion.  Will proceed with image guided biopsy of the left iliac lesion today by Dr. Kathlene Cote.   Risks and benefits of iliac bone lesion biopsy was discussed with the patient and/or patient's family including, but not limited to bleeding, infection, damage to adjacent structures or low yield requiring additional tests.  All of the questions were answered and there is agreement to proceed.  Consent signed and in chart.   Thank you for allowing our service to participate in Marcia [name] 's care.  Electronically Signed: Murrell Redden, PA-C   02/02/2023, 9:27 AM      I spent a total of  30 Minutes  in face to face in clinical consultation, greater than 50% of which was counseling/coordinating care for iliac bone lesion biopsy.

## 2023-02-02 NOTE — Procedures (Signed)
Interventional Radiology Procedure Note  Procedure: CT Guided Biopsy of left iliac bone lesion  Complications: None  Estimated Blood Loss: < 10 mL  Findings: 10 G core biopsy of left iliac bone lesion performed under CT guidance.  Single core sample also including adjacent marrow obtained and sent to Pathology.  Venetia Night. Kathlene Cote, M.D Pager:  [phone]

## 2023-02-03 ENCOUNTER — Inpatient Hospital Stay: Payer: Medicare Other

## 2023-02-03 ENCOUNTER — Other Ambulatory Visit: Payer: Self-pay | Admitting: Hematology & Oncology

## 2023-02-03 ENCOUNTER — Telehealth: Payer: Self-pay | Admitting: *Deleted

## 2023-02-03 DIAGNOSIS — I824Z3 Acute embolism and thrombosis of unspecified deep veins of distal lower extremity, bilateral: Secondary | ICD-10-CM

## 2023-02-03 DIAGNOSIS — I2601 Septic pulmonary embolism with acute cor pulmonale: Secondary | ICD-10-CM

## 2023-02-03 NOTE — Telephone Encounter (Signed)
Call placed to check on patient's status after call received to scheduling from pt to cancel appt for today.  Pt states that she had left iliac bone biopsy yesterday and is in too much pain to come in today and has rescheduled for next week.  She states that she is taking Tylenol with no relief. Pt instructed to take Tramadol as ordered per order of Dr. [name].  Pt is appreciative of call, has no questions at this time and states that she will take Tramadol as prescribed for hip pain.

## 2023-02-06 LAB — SURGICAL PATHOLOGY

## 2023-02-10 ENCOUNTER — Encounter: Payer: Self-pay | Admitting: Hematology & Oncology

## 2023-02-10 ENCOUNTER — Inpatient Hospital Stay: Payer: Medicare Other

## 2023-02-10 ENCOUNTER — Inpatient Hospital Stay (HOSPITAL_BASED_OUTPATIENT_CLINIC_OR_DEPARTMENT_OTHER): Payer: Medicare Other | Admitting: Hematology & Oncology

## 2023-02-10 VITALS — BP 141/77 | HR 82 | Temp 98.1°F | Resp 18

## 2023-02-10 DIAGNOSIS — C9001 Multiple myeloma in remission: Secondary | ICD-10-CM

## 2023-02-10 DIAGNOSIS — C50911 Malignant neoplasm of unspecified site of right female breast: Secondary | ICD-10-CM | POA: Diagnosis not present

## 2023-02-10 DIAGNOSIS — Z5111 Encounter for antineoplastic chemotherapy: Secondary | ICD-10-CM | POA: Diagnosis not present

## 2023-02-10 LAB — CBC WITH DIFFERENTIAL (CANCER CENTER ONLY)
Abs Immature Granulocytes: 0.03 10*3/uL (ref 0.00–0.07)
Basophils Absolute: 0 10*3/uL (ref 0.0–0.1)
Basophils Relative: 1 %
Eosinophils Absolute: 0.1 10*3/uL (ref 0.0–0.5)
Eosinophils Relative: 1 %
HCT: 35.8 % — ABNORMAL LOW (ref 36.0–46.0)
Hemoglobin: 12 g/dL (ref 12.0–15.0)
Immature Granulocytes: 1 %
Lymphocytes Relative: 34 %
Lymphs Abs: 1.7 10*3/uL (ref 0.7–4.0)
MCH: 36.5 pg — ABNORMAL HIGH (ref 26.0–34.0)
MCHC: 33.5 g/dL (ref 30.0–36.0)
MCV: 108.8 fL — ABNORMAL HIGH (ref 80.0–100.0)
Monocytes Absolute: 0.4 10*3/uL (ref 0.1–1.0)
Monocytes Relative: 8 %
Neutro Abs: 2.7 10*3/uL (ref 1.7–7.7)
Neutrophils Relative %: 55 %
Platelet Count: 214 10*3/uL (ref 150–400)
RBC: 3.29 MIL/uL — ABNORMAL LOW (ref 3.87–5.11)
RDW: 14.6 % (ref 11.5–15.5)
WBC Count: 4.9 10*3/uL (ref 4.0–10.5)
nRBC: 0 % (ref 0.0–0.2)

## 2023-02-10 LAB — CMP (CANCER CENTER ONLY)
ALT: 15 U/L (ref 0–44)
AST: 14 U/L — ABNORMAL LOW (ref 15–41)
Albumin: 4.2 g/dL (ref 3.5–5.0)
Alkaline Phosphatase: 77 U/L (ref 38–126)
Anion gap: 10 (ref 5–15)
BUN: 20 mg/dL (ref 8–23)
CO2: 26 mmol/L (ref 22–32)
Calcium: 9.4 mg/dL (ref 8.9–10.3)
Chloride: 104 mmol/L (ref 98–111)
Creatinine: 1.29 mg/dL — ABNORMAL HIGH (ref 0.44–1.00)
GFR, Estimated: 44 mL/min — ABNORMAL LOW (ref >60)
Glucose, Bld: 99 mg/dL (ref 70–99)
Potassium: 3.9 mmol/L (ref 3.5–5.1)
Sodium: 140 mmol/L (ref 135–145)
Total Bilirubin: 1 mg/dL (ref 0.3–1.2)
Total Protein: 6.1 g/dL — ABNORMAL LOW (ref 6.5–8.1)

## 2023-02-10 MED ORDER — DEXAMETHASONE 4 MG PO TABS: 12.0000 mg | ORAL_TABLET | Freq: Once | ORAL | Status: DC

## 2023-02-10 MED ORDER — DIPHENHYDRAMINE HCL 25 MG PO CAPS: 50.0000 mg | ORAL_CAPSULE | Freq: Once | ORAL | Status: DC

## 2023-02-10 MED ORDER — ACETAMINOPHEN 325 MG PO TABS: 650.0000 mg | ORAL_TABLET | Freq: Once | ORAL | Status: DC

## 2023-02-10 MED ORDER — DEXTROSE 5 % IV SOLN
70.0000 mg/m2 | Freq: Once | INTRAVENOUS | Status: AC
  Administered 2023-02-10: 140 mg via INTRAVENOUS
  Filled 2023-02-10: qty 60

## 2023-02-10 MED ORDER — SODIUM CHLORIDE 0.9% FLUSH
10.0000 mL | INTRAVENOUS | Status: DC | PRN
  Administered 2023-02-10: 20 mL

## 2023-02-10 MED ORDER — DARATUMUMAB-HYALURONIDASE-FIHJ 1800-30000 MG-UT/15ML ~~LOC~~ SOLN
1800.0000 mg | Freq: Once | SUBCUTANEOUS | Status: AC
  Administered 2023-02-10: 1800 mg via SUBCUTANEOUS
  Filled 2023-02-10: qty 15

## 2023-02-10 MED ORDER — SODIUM CHLORIDE 0.9 % IV SOLN: Freq: Once | INTRAVENOUS | Status: AC

## 2023-02-10 NOTE — Progress Notes (Signed)
Hematology and Oncology Follow Up Visit  Marcia [name] [id number] [birth year] 72 y.o. 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  She feels well.  She really has had no specific complaints.  We did do a CT scan on her.  This was done on 01/16/2023.  The CT scan did show some thing that was concerning.  She had these expansile soft tissue lesions in the manubrium, left lateral sixth rib, left iliac bone.  I do not know if this is from her myeloma or this is from her breast cancer that is recurred.  Will get have to get a biopsy.  Of note, her last CA 27.29 was 58.  T has now been about 4 years that she has had any treatment for her breast cancer.  Of note, when she had her lumpectomy, there is no active disease noted.  She is doing well with the myeloma.  Her last Kappa light chain was down to 12.4 mg/dL.  I think she is tolerated treatment pretty well.  She actually looks quite good.  She feels pretty good.  She really has had no specific complaints.  I think she is tolerated treatment nicely.  Overall, I would say that her performance status is probably ECOG 1.   Medications:  Allergies as of 02/10/2023       Reactions   Heparin Other (See  Comments)   HIT   Sulfamethoxazole-trimethoprim Other (See Comments), Rash   ? Renal failure.   Zofran [ondansetron] Shortness Of Breath   "felt like throat was closing"   Clarithromycin Diarrhea, Nausea And Vomiting   Latex Rash   Macrodantin [nitrofurantoin] Rash        Medication List        Accurate as of February 10, 2023 12:55 PM. If you have any questions, ask your nurse or doctor.          acetaminophen 325 MG tablet Commonly known as: TYLENOL Take 650 mg by mouth daily as needed for moderate pain or headache.   dexamethasone 6 MG tablet Commonly known as: DECADRON Take 2 tablets (12 mg total) by mouth daily. Take 30 minutes prior to treatment.   Eliquis 2.5 MG Tabs tablet Generic drug: apixaban TAKE 1 TABLET BY MOUTH TWICE A DAY   famciclovir 250 MG tablet Commonly known as: FAMVIR TAKE 1 TABLET BY MOUTH EVERY DAY   feeding supplement Liqd Take 237 mLs by mouth 3 (three) times daily between meals. What changed: when to take this   folic acid 1 MG tablet Commonly known as: FOLVITE TAKE 2 TABLETS (2 MG TOTAL) BY MOUTH DAILY.   LORazepam 0.5 MG tablet Commonly known as: ATIVAN Take 1 tablet by mouth daily as needed.   losartan 25 MG tablet Commonly known as: COZAAR Take  25 mg by mouth daily.   montelukast 10 MG tablet Commonly known as: Singulair Take 1 tablet (10 mg total) by mouth at bedtime.   NON FORMULARY Nerve Savior 2 capsules daily   prochlorperazine 10 MG tablet Commonly known as: COMPAZINE Take 1 tablet (10 mg total) by mouth every 6 (six) hours as needed for nausea or vomiting.   traMADol 50 MG tablet Commonly known as: ULTRAM Take 1 tablet by mouth every 12 (twelve) hours as needed.   VITAMIN E PO daily.        Allergies:  Allergies  Allergen Reactions  . Heparin Other (See Comments)    HIT  . Sulfamethoxazole-Trimethoprim Other (See Comments) and Rash    ? Renal failure.  . Zofran [Ondansetron] Shortness Of Breath     "felt like throat was closing"  . Clarithromycin Diarrhea and Nausea And Vomiting  . Latex Rash  . Macrodantin [Nitrofurantoin] Rash    Past Medical History, Surgical history, Social history, and Family History were reviewed and updated.  Review of Systems: Review of Systems  Constitutional: Negative.   HENT: Negative.    Eyes: Negative.   Respiratory: Negative.    Cardiovascular: Negative.   Gastrointestinal: Negative.   Genitourinary: Negative.   Musculoskeletal: Negative.   Skin: Negative.   Neurological: Negative.   Endo/Heme/Allergies: Negative.   Psychiatric/Behavioral: Negative.        Physical Exam:  weight is 221 lb (100.2 kg). Her oral temperature is 98.1 F (36.7 C). Her blood pressure is 106/65 and her pulse is 82. Her respiration is 18 and oxygen saturation is 98%.   Wt Readings from Last 3 Encounters:  02/10/23 221 lb (100.2 kg)  02/02/23 222 lb (100.7 kg)  01/20/23 222 lb 12.8 oz (101.1 kg)    Physical Exam Vitals reviewed.  HENT:     Head: Normocephalic and atraumatic.  Eyes:     Pupils: Pupils are equal, round, and reactive to light.  Cardiovascular:     Rate and Rhythm: Normal rate and regular rhythm.     Heart sounds: Normal heart sounds.  Pulmonary:     Effort: Pulmonary effort is normal.     Breath sounds: Normal breath sounds.  Abdominal:     General: Bowel sounds are normal.     Palpations: Abdomen is soft.  Musculoskeletal:        General: No tenderness or deformity. Normal range of motion.     Cervical back: Normal range of motion.  Lymphadenopathy:     Cervical: No cervical adenopathy.  Skin:    General: Skin is warm and dry.     Findings: No erythema or rash.  Neurological:     Mental Status: She is alert and oriented to person, place, and time.  Psychiatric:        Behavior: Behavior normal.        Thought Content: Thought content normal.        Judgment: Judgment normal.     Lab Results  Component Value Date   WBC 4.9  02/10/2023   HGB 12.0 02/10/2023   HCT 35.8 (L) 02/10/2023   MCV 108.8 (H) 02/10/2023   PLT 214 02/10/2023   Lab Results  Component Value Date   FERRITIN 820 (H) 05/17/2018   IRON 272 (H) 05/17/2018   TIBC 295 05/17/2018   UIBC 23 05/17/2018   IRONPCTSAT 92 (H) 05/17/2018   Lab Results  Component Value Date   RETICCTPCT 1.3 03/27/2011   RBC 3.29 (L) 02/10/2023  RETICCTABS 56.4 03/27/2011   Lab Results  Component Value Date   KPAFRELGTCHN 120.1 (H) 01/20/2023   LAMBDASER 3.5 (L) 01/20/2023   KAPLAMBRATIO 34.31 (H) 01/20/2023   Lab Results  Component Value Date   IGGSERUM 269 (L) 01/20/2023   IGGSERUM 329 (L) 01/20/2023   IGA 10 (L) 01/20/2023   IGA 6 (L) 01/20/2023   IGMSERUM 12 (L) 01/20/2023   IGMSERUM 11 (L) 01/20/2023   Lab Results  Component Value Date   TOTALPROTELP 5.6 (L) 01/20/2023   ALBUMINELP 3.4 01/20/2023   A1GS 0.2 01/20/2023   A2GS 0.7 01/20/2023   BETS 0.9 01/20/2023   BETA2SER 0.4 07/02/2015   GAMS 0.3 (L) 01/20/2023   MSPIKE 0.1 (H) 01/20/2023   SPEI Comment 10/02/2022     Chemistry      Component Value Date/Time   NA 140 02/10/2023 1152   NA 141 07/06/2017 0816   NA 141 01/05/2017 0956   K 3.9 02/10/2023 1152   K 3.6 07/06/2017 0816   K 4.3 01/05/2017 0956   CL 104 02/10/2023 1152   CL 101 07/06/2017 0816   CO2 26 02/10/2023 1152   CO2 28 07/06/2017 0816   CO2 23 01/05/2017 0956   BUN 20 02/10/2023 1152   BUN 28 (H) 07/06/2017 0816   BUN 22.4 01/05/2017 0956   CREATININE 1.29 (H) 02/10/2023 1152   CREATININE 1.6 (H) 07/06/2017 0816   CREATININE 1.4 (H) 01/05/2017 0956      Component Value Date/Time   CALCIUM 9.4 02/10/2023 1152   CALCIUM 9.1 07/06/2017 0816   CALCIUM 9.3 01/05/2017 0956   ALKPHOS 77 02/10/2023 1152   ALKPHOS 58 07/06/2017 0816   ALKPHOS 52 01/05/2017 0956   AST 14 (L) 02/10/2023 1152   AST 14 01/05/2017 0956   ALT 15 02/10/2023 1152   ALT 58 (H) 07/06/2017 0816   ALT 13 01/05/2017 0956   BILITOT 1.0  02/10/2023 1152   BILITOT 0.56 01/05/2017 0956       Impression and Plan: Marcia [name] is a very pleasant 73 yo caucasian female with kappa light chain myeloma that has recurred again.    We will go ahead with her 4th cycle of treatment.  Hopefully, we will see that her kappa light chains are still coming down nicely.  I do worry about these bony issues.  Again we will go ahead and get a biopsy.  We will see what Interventional radiology can do for [place] with respect to a biopsy.  I talked to her about all this.  She still has a strong faith.  I am very impressed with her stamina and her faith.  The big news is that a son is getting married in August.  She, and, want to make sure that she is going to be able to enjoy the wedding.  We will plan to get her back here in another month, or sooner depending on the biopsy.     [name], MD 3/19/202412:55 PM

## 2023-02-10 NOTE — Patient Instructions (Signed)
[facility]  Discharge Instructions: Thank you for choosing [facility] to provide your oncology and hematology care.   If you have a lab appointment with the Cancer Center, please go directly to the Cancer Center and check in at the registration area.  Wear comfortable clothing and clothing appropriate for easy access to any Portacath or PICC line.   We strive to give you quality time with your provider. You may need to reschedule your appointment if you arrive late (15 or more minutes).  Arriving late affects you and other patients whose appointments are after yours.  Also, if you miss three or more appointments without notifying the office, you may be dismissed from the clinic at the provider's discretion.      For prescription refill requests, have your pharmacy contact our office and allow 72 hours for refills to be completed.    Today you received the following chemotherapy and/or immunotherapy agents Kyprolis/Darzalex       To help prevent nausea and vomiting after your treatment, we encourage you to take your nausea medication as directed.  BELOW ARE SYMPTOMS THAT SHOULD BE REPORTED IMMEDIATELY: *FEVER GREATER THAN 100.4 F (38 C) OR HIGHER *CHILLS OR SWEATING *NAUSEA AND VOMITING THAT IS NOT CONTROLLED WITH YOUR NAUSEA MEDICATION *UNUSUAL SHORTNESS OF BREATH *UNUSUAL BRUISING OR BLEEDING *URINARY PROBLEMS (pain or burning when urinating, or frequent urination) *BOWEL PROBLEMS (unusual diarrhea, constipation, pain near the anus) TENDERNESS IN MOUTH AND THROAT WITH OR WITHOUT PRESENCE OF ULCERS (sore throat, sores in mouth, or a toothache) UNUSUAL RASH, SWELLING OR PAIN  UNUSUAL VAGINAL DISCHARGE OR ITCHING   Items with * indicate a potential emergency and should be followed up as soon as possible or go to the Emergency Department if any problems should occur.  Please show the CHEMOTHERAPY ALERT CARD or IMMUNOTHERAPY ALERT CARD  at check-in to the Emergency Department and triage nurse. Should you have questions after your visit or need to cancel or reschedule your appointment, please contact [facility]  [phone] and follow the prompts.  Office hours are 8:00 a.m. to 4:30 p.m. Monday - Friday. Please note that voicemails left after 4:00 p.m. may not be returned until the following business day.  We are closed weekends and major holidays. You have access to a nurse at all times for urgent questions. Please call the main number to the clinic [phone] and follow the prompts.  For any non-urgent questions, you may also contact your provider using MyChart. We now offer e-Visits for anyone 18 and older to request care online for non-urgent symptoms. For details visit [email or web address].   Also download the MyChart app! Go to the app store, search "MyChart", open the app, select [facility], and log in with your MyChart username and password.  

## 2023-02-11 ENCOUNTER — Encounter: Payer: Self-pay | Admitting: Hematology & Oncology

## 2023-02-17 ENCOUNTER — Inpatient Hospital Stay (HOSPITAL_BASED_OUTPATIENT_CLINIC_OR_DEPARTMENT_OTHER): Payer: Medicare Other | Admitting: Hematology & Oncology

## 2023-02-17 ENCOUNTER — Encounter: Payer: Self-pay | Admitting: Hematology & Oncology

## 2023-02-17 ENCOUNTER — Inpatient Hospital Stay: Payer: Medicare Other

## 2023-02-17 ENCOUNTER — Other Ambulatory Visit: Payer: Self-pay

## 2023-02-17 VITALS — BP 159/76 | HR 92 | Temp 98.3°F | Resp 18

## 2023-02-17 VITALS — BP 172/85 | HR 82 | Resp 18

## 2023-02-17 DIAGNOSIS — C50911 Malignant neoplasm of unspecified site of right female breast: Secondary | ICD-10-CM

## 2023-02-17 DIAGNOSIS — Z7189 Other specified counseling: Secondary | ICD-10-CM

## 2023-02-17 DIAGNOSIS — D649 Anemia, unspecified: Secondary | ICD-10-CM

## 2023-02-17 DIAGNOSIS — N171 Acute kidney failure with acute cortical necrosis: Secondary | ICD-10-CM

## 2023-02-17 DIAGNOSIS — D6959 Other secondary thrombocytopenia: Secondary | ICD-10-CM

## 2023-02-17 DIAGNOSIS — I2601 Septic pulmonary embolism with acute cor pulmonale: Secondary | ICD-10-CM

## 2023-02-17 DIAGNOSIS — Z8673 Personal history of transient ischemic attack (TIA), and cerebral infarction without residual deficits: Secondary | ICD-10-CM

## 2023-02-17 DIAGNOSIS — C9001 Multiple myeloma in remission: Secondary | ICD-10-CM

## 2023-02-17 DIAGNOSIS — I2602 Saddle embolus of pulmonary artery with acute cor pulmonale: Secondary | ICD-10-CM

## 2023-02-17 DIAGNOSIS — M109 Gout, unspecified: Secondary | ICD-10-CM

## 2023-02-17 DIAGNOSIS — R8290 Unspecified abnormal findings in urine: Secondary | ICD-10-CM

## 2023-02-17 DIAGNOSIS — N390 Urinary tract infection, site not specified: Secondary | ICD-10-CM

## 2023-02-17 DIAGNOSIS — I10 Essential (primary) hypertension: Secondary | ICD-10-CM

## 2023-02-17 DIAGNOSIS — Z5111 Encounter for antineoplastic chemotherapy: Secondary | ICD-10-CM | POA: Diagnosis not present

## 2023-02-17 LAB — CBC WITH DIFFERENTIAL (CANCER CENTER ONLY)
Abs Immature Granulocytes: 0.05 10*3/uL (ref 0.00–0.07)
Basophils Absolute: 0 10*3/uL (ref 0.0–0.1)
Basophils Relative: 0 %
Eosinophils Absolute: 0.1 10*3/uL (ref 0.0–0.5)
Eosinophils Relative: 2 %
HCT: 35 % — ABNORMAL LOW (ref 36.0–46.0)
Hemoglobin: 11.7 g/dL — ABNORMAL LOW (ref 12.0–15.0)
Immature Granulocytes: 1 %
Lymphocytes Relative: 31 %
Lymphs Abs: 1.9 10*3/uL (ref 0.7–4.0)
MCH: 36.8 pg — ABNORMAL HIGH (ref 26.0–34.0)
MCHC: 33.4 g/dL (ref 30.0–36.0)
MCV: 110.1 fL — ABNORMAL HIGH (ref 80.0–100.0)
Monocytes Absolute: 0.6 10*3/uL (ref 0.1–1.0)
Monocytes Relative: 10 %
Neutro Abs: 3.5 10*3/uL (ref 1.7–7.7)
Neutrophils Relative %: 56 %
Platelet Count: 110 10*3/uL — ABNORMAL LOW (ref 150–400)
RBC: 3.18 MIL/uL — ABNORMAL LOW (ref 3.87–5.11)
RDW: 14.3 % (ref 11.5–15.5)
WBC Count: 6.2 10*3/uL (ref 4.0–10.5)
nRBC: 0 % (ref 0.0–0.2)

## 2023-02-17 LAB — CMP (CANCER CENTER ONLY)
ALT: 16 U/L (ref 0–44)
AST: 15 U/L (ref 15–41)
Albumin: 4.1 g/dL (ref 3.5–5.0)
Alkaline Phosphatase: 64 U/L (ref 38–126)
Anion gap: 9 (ref 5–15)
BUN: 20 mg/dL (ref 8–23)
CO2: 25 mmol/L (ref 22–32)
Calcium: 9.1 mg/dL (ref 8.9–10.3)
Chloride: 106 mmol/L (ref 98–111)
Creatinine: 1.29 mg/dL — ABNORMAL HIGH (ref 0.44–1.00)
GFR, Estimated: 44 mL/min — ABNORMAL LOW (ref >60)
Glucose, Bld: 100 mg/dL — ABNORMAL HIGH (ref 70–99)
Potassium: 4 mmol/L (ref 3.5–5.1)
Sodium: 140 mmol/L (ref 135–145)
Total Bilirubin: 0.8 mg/dL (ref 0.3–1.2)
Total Protein: 5.8 g/dL — ABNORMAL LOW (ref 6.5–8.1)

## 2023-02-17 LAB — LACTATE DEHYDROGENASE: LDH: 199 U/L — ABNORMAL HIGH (ref 98–192)

## 2023-02-17 MED ORDER — AMOXICILLIN 500 MG PO TABS: 2000.0000 mg | ORAL_TABLET | Freq: Once | ORAL | 0 refills | Status: AC

## 2023-02-17 MED ORDER — DIPHENHYDRAMINE HCL 25 MG PO CAPS: 50.0000 mg | ORAL_CAPSULE | Freq: Once | ORAL | Status: DC

## 2023-02-17 MED ORDER — SODIUM CHLORIDE 0.9 % IV SOLN: Freq: Once | INTRAVENOUS | Status: AC

## 2023-02-17 MED ORDER — DEXTROSE 5 % IV SOLN
70.0000 mg/m2 | Freq: Once | INTRAVENOUS | Status: AC
  Administered 2023-02-17: 140 mg via INTRAVENOUS
  Filled 2023-02-17: qty 60

## 2023-02-17 MED ORDER — DEXAMETHASONE 4 MG PO TABS: 12.0000 mg | ORAL_TABLET | Freq: Once | ORAL | Status: DC

## 2023-02-17 MED ORDER — SODIUM CHLORIDE 0.9% FLUSH
10.0000 mL | INTRAVENOUS | Status: DC | PRN
  Administered 2023-02-17: 20 mL

## 2023-02-17 MED ORDER — DARATUMUMAB-HYALURONIDASE-FIHJ 1800-30000 MG-UT/15ML ~~LOC~~ SOLN: 1800.0000 mg | Freq: Once | SUBCUTANEOUS | Status: DC

## 2023-02-17 MED ORDER — ACETAMINOPHEN 325 MG PO TABS: 650.0000 mg | ORAL_TABLET | Freq: Once | ORAL | Status: DC

## 2023-02-17 NOTE — Patient Instructions (Signed)

## 2023-02-17 NOTE — Patient Instructions (Signed)
[facility]  Discharge Instructions: Thank you for choosing [facility] to provide your oncology and hematology care.   If you have a lab appointment with the Cancer Center, please go directly to the Cancer Center and check in at the registration area.  Wear comfortable clothing and clothing appropriate for easy access to any Portacath or PICC line.   We strive to give you quality time with your provider. You may need to reschedule your appointment if you arrive late (15 or more minutes).  Arriving late affects you and other patients whose appointments are after yours.  Also, if you miss three or more appointments without notifying the office, you may be dismissed from the clinic at the provider's discretion.      For prescription refill requests, have your pharmacy contact our office and allow 72 hours for refills to be completed.    Today you received the following chemotherapy and/or immunotherapy agents Kyprolis       To help prevent nausea and vomiting after your treatment, we encourage you to take your nausea medication as directed.  BELOW ARE SYMPTOMS THAT SHOULD BE REPORTED IMMEDIATELY: *FEVER GREATER THAN 100.4 F (38 C) OR HIGHER *CHILLS OR SWEATING *NAUSEA AND VOMITING THAT IS NOT CONTROLLED WITH YOUR NAUSEA MEDICATION *UNUSUAL SHORTNESS OF BREATH *UNUSUAL BRUISING OR BLEEDING *URINARY PROBLEMS (pain or burning when urinating, or frequent urination) *BOWEL PROBLEMS (unusual diarrhea, constipation, pain near the anus) TENDERNESS IN MOUTH AND THROAT WITH OR WITHOUT PRESENCE OF ULCERS (sore throat, sores in mouth, or a toothache) UNUSUAL RASH, SWELLING OR PAIN  UNUSUAL VAGINAL DISCHARGE OR ITCHING   Items with * indicate a potential emergency and should be followed up as soon as possible or go to the Emergency Department if any problems should occur.  Please show the CHEMOTHERAPY ALERT CARD or IMMUNOTHERAPY ALERT CARD at  check-in to the Emergency Department and triage nurse. Should you have questions after your visit or need to cancel or reschedule your appointment, please contact [facility]  [phone] and follow the prompts.  Office hours are 8:00 a.m. to 4:30 p.m. Monday - Friday. Please note that voicemails left after 4:00 p.m. may not be returned until the following business day.  We are closed weekends and major holidays. You have access to a nurse at all times for urgent questions. Please call the main number to the clinic [phone] and follow the prompts.  For any non-urgent questions, you may also contact your provider using MyChart. We now offer e-Visits for anyone 18 and older to request care online for non-urgent symptoms. For details visit [email or web address].   Also download the MyChart app! Go to the app store, search "MyChart", open the app, select [facility], and log in with your MyChart username and password.   

## 2023-02-17 NOTE — Progress Notes (Signed)
OK to treat with creat-1.29 per order of Dr. [name].

## 2023-02-17 NOTE — Progress Notes (Signed)
Hematology and Oncology Follow Up Visit  Marcia [name] [id number] 09-08-1951 72 y.o. 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 is here today for early follow-up.  When we last saw her, we had done some scans.  There was some concern about a expansive soft tissue lesion in the bones.  She subsequently underwent a bone biopsy.  This was done on 02/02/2023.  The pathology report [id number]) showed minute focus of atypical cells which appear to be consistent with metastatic breast cancer.  Unfortunately, I cannot get prognostic markers on the material.  However, have to believe that she still has ER- tumor but HER2 positive.    It is possible we may have to think about doing a liquid biopsy to see about getting the markers we need.   Her last CA 27-29 was elevated at 78.  I am going to get a PET scan to see if there is any disease anywhere else.  As far as her myeloma is concerned, she has been doing fairly well with this.  She had her last myeloma studies which showed a M spike of 0.1 g/dL.  Her Kappa light chain was 11.7 mg/dL.  Overall, I would say that her performance status is probably ECOG 1.   Medications:  Allergies as of 02/17/2023        Reactions   Heparin Other (See Comments)   HIT   Sulfamethoxazole-trimethoprim Other (See Comments), Rash   ? Renal failure.   Zofran [ondansetron] Shortness Of Breath   "felt like throat was closing"   Clarithromycin Diarrhea, Nausea And Vomiting   Latex Rash   Macrodantin [nitrofurantoin] Rash        Medication List        Accurate as of February 17, 2023 11:44 AM. If you have any questions, ask your nurse or doctor.          acetaminophen 325 MG tablet Commonly known as: TYLENOL Take 650 mg by mouth daily as needed for moderate pain or headache.   dexamethasone 6 MG tablet Commonly known as: DECADRON Take 2 tablets (12 mg total) by mouth daily. Take 30 minutes prior to treatment.   Eliquis 2.5 MG Tabs tablet Generic drug: apixaban TAKE 1 TABLET BY MOUTH TWICE A DAY   famciclovir 250 MG tablet Commonly known as: FAMVIR TAKE 1 TABLET BY MOUTH EVERY DAY   feeding supplement Liqd Take 237 mLs by mouth 3 (three) times daily between meals. What changed: when to take this   folic acid 1 MG tablet Commonly known as: FOLVITE TAKE 2 TABLETS (2 MG TOTAL) BY MOUTH DAILY.   LORazepam 0.5 MG tablet Commonly known as: ATIVAN Take 1 tablet by mouth daily as needed.   losartan 25 MG tablet Commonly known as: COZAAR Take 25 mg by mouth  daily.   montelukast 10 MG tablet Commonly known as: Singulair Take 1 tablet (10 mg total) by mouth at bedtime.   NON FORMULARY Nerve Savior 2 capsules daily   prochlorperazine 10 MG tablet Commonly known as: COMPAZINE Take 1 tablet (10 mg total) by mouth every 6 (six) hours as needed for nausea or vomiting.   traMADol 50 MG tablet Commonly known as: ULTRAM Take 1 tablet by mouth every 12 (twelve) hours as needed.   VITAMIN E PO daily.        Allergies:  Allergies  Allergen Reactions   Heparin Other (See Comments)    HIT   Sulfamethoxazole-Trimethoprim Other (See Comments) and Rash    ? Renal failure.   Zofran  [Ondansetron] Shortness Of Breath    "felt like throat was closing"   Clarithromycin Diarrhea and Nausea And Vomiting   Latex Rash   Macrodantin [Nitrofurantoin] Rash    Past Medical History, Surgical history, Social history, and Family History were reviewed and updated.  Review of Systems: Review of Systems  Constitutional: Negative.   HENT: Negative.    Eyes: Negative.   Respiratory: Negative.    Cardiovascular: Negative.   Gastrointestinal: Negative.   Genitourinary: Negative.   Musculoskeletal: Negative.   Skin: Negative.   Neurological: Negative.   Endo/Heme/Allergies: Negative.   Psychiatric/Behavioral: Negative.        Physical Exam:  oral temperature is 98.3 F (36.8 C). Her blood pressure is 159/76 (abnormal) and her pulse is 92. Her respiration is 18 and oxygen saturation is 98%.   Wt Readings from Last 3 Encounters:  02/10/23 221 lb (100.2 kg)  02/02/23 222 lb (100.7 kg)  01/20/23 222 lb 12.8 oz (101.1 kg)    Physical Exam Vitals reviewed.  HENT:     Head: Normocephalic and atraumatic.  Eyes:     Pupils: Pupils are equal, round, and reactive to light.  Cardiovascular:     Rate and Rhythm: Normal rate and regular rhythm.     Heart sounds: Normal heart sounds.  Pulmonary:     Effort: Pulmonary effort is normal.     Breath sounds: Normal breath sounds.  Abdominal:     General: Bowel sounds are normal.     Palpations: Abdomen is soft.  Musculoskeletal:        General: No tenderness or deformity. Normal range of motion.     Cervical back: Normal range of motion.  Lymphadenopathy:     Cervical: No cervical adenopathy.  Skin:    General: Skin is warm and dry.     Findings: No erythema or rash.  Neurological:     Mental Status: She is alert and oriented to person, place, and time.  Psychiatric:        Behavior: Behavior normal.        Thought Content: Thought content normal.        Judgment: Judgment normal.     Lab Results  Component Value Date    WBC 6.2 02/17/2023   HGB 11.7 (L) 02/17/2023   HCT 35.0 (L) 02/17/2023   MCV 110.1 (H) 02/17/2023   PLT 110 (L) 02/17/2023   Lab Results  Component Value Date   FERRITIN 820 (H) 05/17/2018   IRON 272 (H) 05/17/2018   TIBC 295 05/17/2018   UIBC 23 05/17/2018   IRONPCTSAT 92 (H) 05/17/2018   Lab Results  Component Value Date   RETICCTPCT 1.3 03/27/2011   RBC 3.18 (L) 02/17/2023   RETICCTABS 56.4 03/27/2011   Lab  Results  Component Value Date   KPAFRELGTCHN 120.1 (H) 01/20/2023   LAMBDASER 3.5 (L) 01/20/2023   KAPLAMBRATIO 34.31 (H) 01/20/2023   Lab Results  Component Value Date   IGGSERUM 269 (L) 01/20/2023   IGGSERUM 329 (L) 01/20/2023   IGA 10 (L) 01/20/2023   IGA 6 (L) 01/20/2023   IGMSERUM 12 (L) 01/20/2023   IGMSERUM 11 (L) 01/20/2023   Lab Results  Component Value Date   TOTALPROTELP 5.6 (L) 01/20/2023   ALBUMINELP 3.4 01/20/2023   A1GS 0.2 01/20/2023   A2GS 0.7 01/20/2023   BETS 0.9 01/20/2023   BETA2SER 0.4 07/02/2015   GAMS 0.3 (L) 01/20/2023   MSPIKE 0.1 (H) 01/20/2023   SPEI Comment 10/02/2022     Chemistry      Component Value Date/Time   NA 140 02/17/2023 0926   NA 141 07/06/2017 0816   NA 141 01/05/2017 0956   K 4.0 02/17/2023 0926   K 3.6 07/06/2017 0816   K 4.3 01/05/2017 0956   CL 106 02/17/2023 0926   CL 101 07/06/2017 0816   CO2 25 02/17/2023 0926   CO2 28 07/06/2017 0816   CO2 23 01/05/2017 0956   BUN 20 02/17/2023 0926   BUN 28 (H) 07/06/2017 0816   BUN 22.4 01/05/2017 0956   CREATININE 1.29 (H) 02/17/2023 0926   CREATININE 1.6 (H) 07/06/2017 0816   CREATININE 1.4 (H) 01/05/2017 0956      Component Value Date/Time   CALCIUM 9.1 02/17/2023 0926   CALCIUM 9.1 07/06/2017 0816   CALCIUM 9.3 01/05/2017 0956   ALKPHOS 64 02/17/2023 0926   ALKPHOS 58 07/06/2017 0816   ALKPHOS 52 01/05/2017 0956   AST 15 02/17/2023 0926   AST 14 01/05/2017 0956   ALT 16 02/17/2023 0926   ALT 58 (H) 07/06/2017 0816   ALT 13 01/05/2017 0956    BILITOT 0.8 02/17/2023 0926   BILITOT 0.56 01/05/2017 0956       Impression and Plan: Ms. Demastus is a very pleasant 72 yo caucasian female with kappa light chain myeloma that has recurred again.   It now looks like we are now dealing with recurrent breast cancer.  The PET scan will be critical.  Again, we may have to think about doing molecular studies on blood to see what her HER2 status is.  I have to believe that we are still looking at tumor that is HER2 positive.  I know that she is trying hard.  I know that the myeloma seems to be doing fairly well right now.  It will be very interesting to see what the PET scan shows.  I think the PET scan will be done next week.   The big event for her is her son's wedding in August.  I know that she will have a good time at the wedding.  I know that she is looking forward to this.  I want to make sure that we are aggressive and continue to try to help her out and give her a good quality of life.    [name], MD 3/26/202411:44 AM

## 2023-02-18 LAB — CANCER ANTIGEN 27.29: CA 27.29: 78.3 U/mL — ABNORMAL HIGH (ref 0.0–38.6)

## 2023-02-18 LAB — KAPPA/LAMBDA LIGHT CHAINS
Kappa free light chain: 116.9 mg/L — ABNORMAL HIGH (ref 3.3–19.4)
Kappa, lambda light chain ratio: 32.47 — ABNORMAL HIGH (ref 0.26–1.65)
Lambda free light chains: 3.6 mg/L — ABNORMAL LOW (ref 5.7–26.3)

## 2023-02-19 ENCOUNTER — Encounter: Payer: Self-pay | Admitting: Hematology & Oncology

## 2023-02-19 LAB — IGG, IGA, IGM
IgA: 10 mg/dL — ABNORMAL LOW (ref 64–422)
IgG (Immunoglobin G), Serum: 248 mg/dL — ABNORMAL LOW (ref 586–1602)
IgM (Immunoglobulin M), Srm: 10 mg/dL — ABNORMAL LOW (ref 26–217)

## 2023-02-24 ENCOUNTER — Inpatient Hospital Stay: Payer: Medicare Other

## 2023-02-24 ENCOUNTER — Inpatient Hospital Stay: Payer: Medicare Other | Attending: Hematology & Oncology

## 2023-02-24 ENCOUNTER — Other Ambulatory Visit: Payer: Self-pay

## 2023-02-24 VITALS — BP 154/72 | HR 85 | Resp 18

## 2023-02-24 DIAGNOSIS — Z86711 Personal history of pulmonary embolism: Secondary | ICD-10-CM | POA: Insufficient documentation

## 2023-02-24 DIAGNOSIS — Z5112 Encounter for antineoplastic immunotherapy: Secondary | ICD-10-CM | POA: Insufficient documentation

## 2023-02-24 DIAGNOSIS — Z5111 Encounter for antineoplastic chemotherapy: Secondary | ICD-10-CM | POA: Insufficient documentation

## 2023-02-24 DIAGNOSIS — Z86718 Personal history of other venous thrombosis and embolism: Secondary | ICD-10-CM | POA: Insufficient documentation

## 2023-02-24 DIAGNOSIS — Z171 Estrogen receptor negative status [ER-]: Secondary | ICD-10-CM | POA: Insufficient documentation

## 2023-02-24 DIAGNOSIS — Z7901 Long term (current) use of anticoagulants: Secondary | ICD-10-CM | POA: Insufficient documentation

## 2023-02-24 DIAGNOSIS — C50911 Malignant neoplasm of unspecified site of right female breast: Secondary | ICD-10-CM | POA: Diagnosis not present

## 2023-02-24 DIAGNOSIS — C7951 Secondary malignant neoplasm of bone: Secondary | ICD-10-CM | POA: Diagnosis not present

## 2023-02-24 DIAGNOSIS — C9000 Multiple myeloma not having achieved remission: Secondary | ICD-10-CM | POA: Insufficient documentation

## 2023-02-24 DIAGNOSIS — C9001 Multiple myeloma in remission: Secondary | ICD-10-CM

## 2023-02-24 LAB — CBC WITH DIFFERENTIAL (CANCER CENTER ONLY)
Abs Immature Granulocytes: 0.04 10*3/uL (ref 0.00–0.07)
Basophils Absolute: 0 10*3/uL (ref 0.0–0.1)
Basophils Relative: 1 %
Eosinophils Absolute: 0.2 10*3/uL (ref 0.0–0.5)
Eosinophils Relative: 3 %
HCT: 33.9 % — ABNORMAL LOW (ref 36.0–46.0)
Hemoglobin: 11.4 g/dL — ABNORMAL LOW (ref 12.0–15.0)
Immature Granulocytes: 1 %
Lymphocytes Relative: 34 %
Lymphs Abs: 1.8 10*3/uL (ref 0.7–4.0)
MCH: 37.5 pg — ABNORMAL HIGH (ref 26.0–34.0)
MCHC: 33.6 g/dL (ref 30.0–36.0)
MCV: 111.5 fL — ABNORMAL HIGH (ref 80.0–100.0)
Monocytes Absolute: 0.5 10*3/uL (ref 0.1–1.0)
Monocytes Relative: 10 %
Neutro Abs: 2.7 10*3/uL (ref 1.7–7.7)
Neutrophils Relative %: 51 %
Platelet Count: 179 10*3/uL (ref 150–400)
RBC: 3.04 MIL/uL — ABNORMAL LOW (ref 3.87–5.11)
RDW: 14.6 % (ref 11.5–15.5)
WBC Count: 5.3 10*3/uL (ref 4.0–10.5)
nRBC: 0 % (ref 0.0–0.2)

## 2023-02-24 LAB — CMP (CANCER CENTER ONLY)
ALT: 15 U/L (ref 0–44)
AST: 16 U/L (ref 15–41)
Albumin: 4.1 g/dL (ref 3.5–5.0)
Alkaline Phosphatase: 64 U/L (ref 38–126)
Anion gap: 10 (ref 5–15)
BUN: 19 mg/dL (ref 8–23)
CO2: 25 mmol/L (ref 22–32)
Calcium: 9.1 mg/dL (ref 8.9–10.3)
Chloride: 106 mmol/L (ref 98–111)
Creatinine: 1.25 mg/dL — ABNORMAL HIGH (ref 0.44–1.00)
GFR, Estimated: 46 mL/min — ABNORMAL LOW (ref >60)
Glucose, Bld: 104 mg/dL — ABNORMAL HIGH (ref 70–99)
Potassium: 3.8 mmol/L (ref 3.5–5.1)
Sodium: 141 mmol/L (ref 135–145)
Total Bilirubin: 0.8 mg/dL (ref 0.3–1.2)
Total Protein: 6 g/dL — ABNORMAL LOW (ref 6.5–8.1)

## 2023-02-24 MED ORDER — DEXTROSE 5 % IV SOLN
70.0000 mg/m2 | Freq: Once | INTRAVENOUS | Status: AC
  Administered 2023-02-24: 140 mg via INTRAVENOUS
  Filled 2023-02-24: qty 60

## 2023-02-24 MED ORDER — SODIUM CHLORIDE 0.9% FLUSH
10.0000 mL | INTRAVENOUS | Status: DC | PRN
  Administered 2023-02-24: 20 mL

## 2023-02-24 MED ORDER — DEXAMETHASONE 4 MG PO TABS: 12.0000 mg | ORAL_TABLET | Freq: Once | ORAL | Status: DC

## 2023-02-24 MED ORDER — SODIUM CHLORIDE 0.9 % IV SOLN: Freq: Once | INTRAVENOUS | Status: AC

## 2023-02-24 NOTE — Patient Instructions (Signed)
[facility]  Discharge Instructions: Thank you for choosing [facility] to provide your oncology and hematology care.   If you have a lab appointment with the Cancer Center, please go directly to the Cancer Center and check in at the registration area.  Wear comfortable clothing and clothing appropriate for easy access to any Portacath or PICC line.   We strive to give you quality time with your provider. You may need to reschedule your appointment if you arrive late (15 or more minutes).  Arriving late affects you and other patients whose appointments are after yours.  Also, if you miss three or more appointments without notifying the office, you may be dismissed from the clinic at the provider's discretion.      For prescription refill requests, have your pharmacy contact our office and allow 72 hours for refills to be completed.    Today you received the following chemotherapy and/or immunotherapy agents Kyprolis       To help prevent nausea and vomiting after your treatment, we encourage you to take your nausea medication as directed.  BELOW ARE SYMPTOMS THAT SHOULD BE REPORTED IMMEDIATELY: *FEVER GREATER THAN 100.4 F (38 C) OR HIGHER *CHILLS OR SWEATING *NAUSEA AND VOMITING THAT IS NOT CONTROLLED WITH YOUR NAUSEA MEDICATION *UNUSUAL SHORTNESS OF BREATH *UNUSUAL BRUISING OR BLEEDING *URINARY PROBLEMS (pain or burning when urinating, or frequent urination) *BOWEL PROBLEMS (unusual diarrhea, constipation, pain near the anus) TENDERNESS IN MOUTH AND THROAT WITH OR WITHOUT PRESENCE OF ULCERS (sore throat, sores in mouth, or a toothache) UNUSUAL RASH, SWELLING OR PAIN  UNUSUAL VAGINAL DISCHARGE OR ITCHING   Items with * indicate a potential emergency and should be followed up as soon as possible or go to the Emergency Department if any problems should occur.  Please show the CHEMOTHERAPY ALERT CARD or IMMUNOTHERAPY ALERT CARD at  check-in to the Emergency Department and triage nurse. Should you have questions after your visit or need to cancel or reschedule your appointment, please contact [facility]  [phone] and follow the prompts.  Office hours are 8:00 a.m. to 4:30 p.m. Monday - Friday. Please note that voicemails left after 4:00 p.m. may not be returned until the following business day.  We are closed weekends and major holidays. You have access to a nurse at all times for urgent questions. Please call the main number to the clinic [phone] and follow the prompts.  For any non-urgent questions, you may also contact your provider using MyChart. We now offer e-Visits for anyone 18 and older to request care online for non-urgent symptoms. For details visit [email or web address].   Also download the MyChart app! Go to the app store, search "MyChart", open the app, select , and log in with your MyChart username and password.   

## 2023-02-26 ENCOUNTER — Encounter (HOSPITAL_COMMUNITY)
Admission: RE | Admit: 2023-02-26 | Discharge: 2023-02-26 | Disposition: A | Discharge status: Home / Self Care | Payer: Medicare Other | Source: Ambulatory Visit | Attending: Hematology & Oncology | Admitting: Hematology & Oncology

## 2023-02-26 DIAGNOSIS — C50911 Malignant neoplasm of unspecified site of right female breast: Secondary | ICD-10-CM | POA: Insufficient documentation

## 2023-02-26 LAB — PROTEIN ELECTROPHORESIS, SERUM, WITH REFLEX
A/G Ratio: 1.5 (ref 0.7–1.7)
Albumin ELP: 3.4 g/dL (ref 2.9–4.4)
Alpha-1-Globulin: 0.2 g/dL (ref 0.0–0.4)
Alpha-2-Globulin: 0.7 g/dL (ref 0.4–1.0)
Beta Globulin: 1 g/dL (ref 0.7–1.3)
Gamma Globulin: 0.3 g/dL — ABNORMAL LOW (ref 0.4–1.8)
Globulin, Total: 2.2 g/dL (ref 2.2–3.9)
M-Spike, %: 0.1 g/dL — ABNORMAL HIGH
SPEP Interpretation: 0
Total Protein ELP: 5.6 g/dL — ABNORMAL LOW (ref 6.0–8.5)

## 2023-02-26 LAB — IMMUNOFIXATION REFLEX, SERUM
IgA: 11 mg/dL — ABNORMAL LOW (ref 64–422)
IgG (Immunoglobin G), Serum: 281 mg/dL — ABNORMAL LOW (ref 586–1602)
IgM (Immunoglobulin M), Srm: 10 mg/dL — ABNORMAL LOW (ref 26–217)

## 2023-02-26 MED ORDER — FLUDEOXYGLUCOSE F - 18 (FDG) INJECTION
11.2400 | Freq: Once | INTRAVENOUS | Status: AC | PRN
  Administered 2023-02-26: 11.24 via INTRAVENOUS

## 2023-03-01 ENCOUNTER — Encounter: Payer: Self-pay | Admitting: Hematology & Oncology

## 2023-03-03 ENCOUNTER — Encounter: Payer: Self-pay | Admitting: Hematology & Oncology

## 2023-03-03 ENCOUNTER — Other Ambulatory Visit: Payer: Self-pay | Admitting: *Deleted

## 2023-03-03 ENCOUNTER — Inpatient Hospital Stay: Payer: Medicare Other | Admitting: Hematology & Oncology

## 2023-03-03 ENCOUNTER — Inpatient Hospital Stay: Payer: Medicare Other

## 2023-03-03 ENCOUNTER — Other Ambulatory Visit: Payer: Self-pay | Admitting: Oncology

## 2023-03-03 VITALS — BP 151/75 | HR 83 | Temp 98.7°F | Resp 20 | Ht <= 58 in | Wt 217.8 lb

## 2023-03-03 DIAGNOSIS — C50911 Malignant neoplasm of unspecified site of right female breast: Secondary | ICD-10-CM | POA: Diagnosis not present

## 2023-03-03 DIAGNOSIS — C9001 Multiple myeloma in remission: Secondary | ICD-10-CM

## 2023-03-03 DIAGNOSIS — Z5111 Encounter for antineoplastic chemotherapy: Secondary | ICD-10-CM | POA: Diagnosis not present

## 2023-03-03 LAB — CMP (CANCER CENTER ONLY)
ALT: 15 U/L (ref 0–44)
AST: 16 U/L (ref 15–41)
Albumin: 3.8 g/dL (ref 3.5–5.0)
Alkaline Phosphatase: 70 U/L (ref 38–126)
Anion gap: 9 (ref 5–15)
BUN: 20 mg/dL (ref 8–23)
CO2: 25 mmol/L (ref 22–32)
Calcium: 8.9 mg/dL (ref 8.9–10.3)
Chloride: 107 mmol/L (ref 98–111)
Creatinine: 1.24 mg/dL — ABNORMAL HIGH (ref 0.44–1.00)
GFR, Estimated: 46 mL/min — ABNORMAL LOW (ref >60)
Glucose, Bld: 102 mg/dL — ABNORMAL HIGH (ref 70–99)
Potassium: 3.8 mmol/L (ref 3.5–5.1)
Sodium: 141 mmol/L (ref 135–145)
Total Bilirubin: 0.7 mg/dL (ref 0.3–1.2)
Total Protein: 5.9 g/dL — ABNORMAL LOW (ref 6.5–8.1)

## 2023-03-03 LAB — CBC WITH DIFFERENTIAL (CANCER CENTER ONLY)
Abs Immature Granulocytes: 0.05 10*3/uL (ref 0.00–0.07)
Basophils Absolute: 0 10*3/uL (ref 0.0–0.1)
Basophils Relative: 0 %
Eosinophils Absolute: 0.2 10*3/uL (ref 0.0–0.5)
Eosinophils Relative: 5 %
HCT: 33.8 % — ABNORMAL LOW (ref 36.0–46.0)
Hemoglobin: 11.2 g/dL — ABNORMAL LOW (ref 12.0–15.0)
Immature Granulocytes: 1 %
Lymphocytes Relative: 32 %
Lymphs Abs: 1.5 10*3/uL (ref 0.7–4.0)
MCH: 37 pg — ABNORMAL HIGH (ref 26.0–34.0)
MCHC: 33.1 g/dL (ref 30.0–36.0)
MCV: 111.6 fL — ABNORMAL HIGH (ref 80.0–100.0)
Monocytes Absolute: 0.6 10*3/uL (ref 0.1–1.0)
Monocytes Relative: 13 %
Neutro Abs: 2.3 10*3/uL (ref 1.7–7.7)
Neutrophils Relative %: 49 %
Platelet Count: 148 10*3/uL — ABNORMAL LOW (ref 150–400)
RBC: 3.03 MIL/uL — ABNORMAL LOW (ref 3.87–5.11)
RDW: 14.6 % (ref 11.5–15.5)
WBC Count: 4.6 10*3/uL (ref 4.0–10.5)
nRBC: 0 % (ref 0.0–0.2)

## 2023-03-03 MED ORDER — SODIUM CHLORIDE 0.9% FLUSH
10.0000 mL | Freq: Once | INTRAVENOUS | Status: AC
  Administered 2023-03-03: 10 mL

## 2023-03-03 MED ORDER — SODIUM CHLORIDE 0.9% FLUSH
10.0000 mL | INTRAVENOUS | Status: DC | PRN
  Administered 2023-03-03: 10 mL

## 2023-03-03 MED ORDER — AMOXICILLIN 500 MG PO TABS: 2000.0000 mg | ORAL_TABLET | Freq: Once | ORAL | 0 refills | Status: AC

## 2023-03-03 NOTE — Progress Notes (Signed)
Is Hematology and Oncology Follow Up Visit  Marcia [name] [id number] [birth year] 72 y.o. 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  When we last saw her, we had done some scans.  There was some concern about a expansive soft tissue lesion in the bones.  She subsequently underwent a bone biopsy.  This was done on 02/02/2023.  The pathology report ([id number]) showed minute focus of atypical cells which appear to be consistent with metastatic breast cancer.  She did have a PET scan that was done.  This did show she had multiple areas of bony involvement.  She had involvement of left clavicle, sternum, left sixth rib and left acetabulum.  I really do think we will get had to get a liquid biopsy on the breast cancer to make sure this is still HER2 positive and ER negative.  She does feel short of breath.  We did have to get an echocardiogram on her.  I think is important for us to see what her ejection fraction is.  She does not want to have any further Kyprolis.  I know she has been on Kyprolis for quite a while.  We will go ahead and drop this.  She would just be on Faspro for the myeloma.  Last  myeloma studies with the light chains showed kappa light chain of 11.7 mg/dL.  This continues to improve.  She did have very nice Easter with her family.  Overall, I would have said that her performance status right now is probably ECOG 1-2.    Medications:  Allergies as of 03/03/2023       Reactions   Heparin Other (See Comments)   HIT   Sulfamethoxazole-trimethoprim Other (See Comments), Rash   ? Renal failure.   Zofran [ondansetron] Shortness Of Breath   "felt like throat was closing"   Clarithromycin Diarrhea, Nausea And Vomiting   Latex Rash   Macrodantin [nitrofurantoin] Rash        Medication List        Accurate as of March 03, 2023 11:34 AM. If you have any questions, ask your nurse or doctor.          STOP taking these medications    NON FORMULARY Stopped by: Josph MachoPeter R Tsugio Elison, MD       TAKE these medications    acetaminophen 325 MG tablet Commonly known as: TYLENOL Take 650 mg by mouth daily as needed for moderate pain or headache.   amoxicillin 500 MG tablet Commonly known as: AMOXIL Take 4 tablets (2,000 mg total) by mouth once for 1 dose.   dexamethasone 6 MG tablet Commonly known as: DECADRON Take 2 tablets (12 mg total) by mouth daily. Take 30 minutes prior to treatment.  Eliquis 2.5 MG Tabs tablet Generic drug: apixaban TAKE 1 TABLET BY MOUTH TWICE A DAY   famciclovir 250 MG tablet Commonly known as: FAMVIR TAKE 1 TABLET BY MOUTH EVERY DAY   feeding supplement Liqd Take 237 mLs by mouth 3 (three) times daily between meals.   folic acid 1 MG tablet Commonly known as: FOLVITE TAKE 2 TABLETS (2 MG TOTAL) BY MOUTH DAILY.   LORazepam 0.5 MG tablet Commonly known as: ATIVAN Take 1 tablet by mouth daily as needed.   losartan 25 MG tablet Commonly known as: COZAAR Take 25 mg by mouth daily.   montelukast 10 MG tablet Commonly known as: Singulair Take 1 tablet (10 mg total) by mouth at bedtime.   prochlorperazine 10 MG  tablet Commonly known as: COMPAZINE Take 1 tablet (10 mg total) by mouth every 6 (six) hours as needed for nausea or vomiting.   traMADol 50 MG tablet Commonly known as: ULTRAM Take 1 tablet by mouth every 12 (twelve) hours as needed.   VITAMIN E PO daily.        Allergies:  Allergies  Allergen Reactions   Heparin Other (See Comments)    HIT   Sulfamethoxazole-Trimethoprim Other (See Comments) and Rash    ? Renal failure.   Zofran [Ondansetron] Shortness Of Breath    "felt like throat was closing"   Clarithromycin Diarrhea and Nausea And Vomiting   Latex Rash   Macrodantin [Nitrofurantoin] Rash    Past Medical History, Surgical history, Social history, and Family History were reviewed and updated.  Review of Systems: Review of Systems  Constitutional: Negative.   HENT: Negative.    Eyes: Negative.   Respiratory: Negative.    Cardiovascular: Negative.   Gastrointestinal: Negative.   Genitourinary: Negative.   Musculoskeletal: Negative.   Skin: Negative.   Neurological: Negative.   Endo/Heme/Allergies: Negative.   Psychiatric/Behavioral: Negative.        Physical Exam:  height is 4\' 10"  (1.473 m) and weight is 217 lb 12.8 oz (98.8 kg). Her oral temperature is 98.7 F (37.1 C). Her blood pressure is 151/75 (abnormal) and her pulse is 83. Her respiration is 20 and oxygen saturation is 98%.   Wt Readings from Last 3 Encounters:  03/03/23 217 lb 12.8 oz (98.8 kg)  02/10/23 221 lb (100.2 kg)  02/02/23 222 lb (100.7 kg)    Physical Exam Vitals reviewed.  HENT:     Head: Normocephalic and atraumatic.  Eyes:     Pupils: Pupils are equal, round, and reactive to light.  Cardiovascular:     Rate and Rhythm: Normal rate and regular rhythm.     Heart sounds: Normal heart sounds.  Pulmonary:     Effort: Pulmonary effort is normal.     Breath sounds: Normal breath sounds.  Abdominal:     General: Bowel sounds are normal.     Palpations: Abdomen is soft.   Musculoskeletal:        General: No tenderness or deformity. Normal range of motion.     Cervical back: Normal range of motion.  Lymphadenopathy:     Cervical: No cervical adenopathy.  Skin:    General: Skin is warm and dry.     Findings: No erythema or rash.  Neurological:     Mental Status: She is alert and oriented to person, place, and time.  Psychiatric:        Behavior: Behavior normal.        Thought Content: Thought content normal.  Judgment: Judgment normal.     Lab Results  Component Value Date   WBC 4.6 03/03/2023   HGB 11.2 (L) 03/03/2023   HCT 33.8 (L) 03/03/2023   MCV 111.6 (H) 03/03/2023   PLT 148 (L) 03/03/2023   Lab Results  Component Value Date   FERRITIN 820 (H) 05/17/2018   IRON 272 (H) 05/17/2018   TIBC 295 05/17/2018   UIBC 23 05/17/2018   IRONPCTSAT 92 (H) 05/17/2018   Lab Results  Component Value Date   RETICCTPCT 1.3 03/27/2011   RBC 3.03 (L) 03/03/2023   RETICCTABS 56.4 03/27/2011   Lab Results  Component Value Date   KPAFRELGTCHN 116.9 (H) 02/17/2023   LAMBDASER 3.6 (L) 02/17/2023   KAPLAMBRATIO 32.47 (H) 02/17/2023   Lab Results  Component Value Date   IGGSERUM 248 (L) 02/17/2023   IGGSERUM 281 (L) 02/17/2023   IGA 10 (L) 02/17/2023   IGA 11 (L) 02/17/2023   IGMSERUM 10 (L) 02/17/2023   IGMSERUM 10 (L) 02/17/2023   Lab Results  Component Value Date   TOTALPROTELP 5.6 (L) 02/17/2023   ALBUMINELP 3.4 02/17/2023   A1GS 0.2 02/17/2023   A2GS 0.7 02/17/2023   BETS 1.0 02/17/2023   BETA2SER 0.4 07/02/2015   GAMS 0.3 (L) 02/17/2023   MSPIKE 0.1 (H) 02/17/2023   SPEI Comment 10/02/2022     Chemistry      Component Value Date/Time   NA 141 03/03/2023 1010   NA 141 07/06/2017 0816   NA 141 01/05/2017 0956   K 3.8 03/03/2023 1010   K 3.6 07/06/2017 0816   K 4.3 01/05/2017 0956   CL 107 03/03/2023 1010   CL 101 07/06/2017 0816   CO2 25 03/03/2023 1010   CO2 28 07/06/2017 0816   CO2 23 01/05/2017 0956   BUN 20  03/03/2023 1010   BUN 28 (H) 07/06/2017 0816   BUN 22.4 01/05/2017 0956   CREATININE 1.24 (H) 03/03/2023 1010   CREATININE 1.6 (H) 07/06/2017 0816   CREATININE 1.4 (H) 01/05/2017 0956      Component Value Date/Time   CALCIUM 8.9 03/03/2023 1010   CALCIUM 9.1 07/06/2017 0816   CALCIUM 9.3 01/05/2017 0956   ALKPHOS 70 03/03/2023 1010   ALKPHOS 58 07/06/2017 0816   ALKPHOS 52 01/05/2017 0956   AST 16 03/03/2023 1010   AST 14 01/05/2017 0956   ALT 15 03/03/2023 1010   ALT 58 (H) 07/06/2017 0816   ALT 13 01/05/2017 0956   BILITOT 0.7 03/03/2023 1010   BILITOT 0.56 01/05/2017 0956       Impression and Plan: Marcia [name] is a very pleasant 72 yo caucasian female with kappa light chain myeloma that has recurred again.   It now looks like we are now dealing with recurrent breast cancer.  The PET scan does show multiple areas of bony involvement.  Again, we are going to have to get a liquid biopsy to check The status of her estrogen and HER2.  This will be very important.  I would still like to try to consider anti-HER2 therapy for her.  She would not get any treatment today just because she does not wish to have any Kyprolis.  I understand this.  As far as treating the breast cancer, we will have to see what the liquid biopsy shows.  We would like to try to get her back in another couple weeks if possible.     Josph Macho, MD 4/9/202411:34 AM

## 2023-03-03 NOTE — Addendum Note (Signed)
Addended by: Corky Sing on: 03/03/2023 12:30 PM   Modules accepted: Orders

## 2023-03-04 ENCOUNTER — Ambulatory Visit: Payer: Medicare Other | Admitting: Dietician

## 2023-03-04 NOTE — Progress Notes (Signed)
Nutrition Assessment: Reached out to patient at home telephone number.    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  She recently took herself off Kyprolis  because she couldn't breath well.  She reports recent weight loss r/t lack of appetite and "no interest in cooking."  Spouse does cook but sometimes the smells make her nauseous.  Ensure complete samples when given at MD office but she doesn't want to spend that much for the ONS. Usual food include: Activia, egg with salsa in microwave sandwiches fish, chicken rice "no fried foods" loves fruit, cheese, red beans, green beans  Fluids include: 2-3 bottles of water, milk 2%  Nutrition Focused Physical Exam: unable to perform NFPE  Anthropometrics:   Height: 58" Weight:  03/03/23  217.7# 02/02/23 222# UBW: 220# BMI: 45.52  INTERVENTION:   Educated on importance of adequate calorie and protein energy intake  with nutrient dense foods when possible to maintain weight/strength Discussed ways to add calories/protein to foods (adding cheese, cooking with butter, creamy sauces/gravy)  Encouraged small frequent meals/snacks with high protein options Relayed some cook and freeze ideas. Reviewed oral nutrition supplement choices and uses  Discussed strategies for freezing blended smoothies Encouraged patient weigh herself at home and reach out if unable to maintain weight within 1-2# Emailed Nutrition Tip sheet  for  smoothie recipes (with and without dairy) high protein snacking with contact information provided  MONITORING, EVALUATION, GOAL: weight trends, nutrition impact symptoms, PO intake, labs   Next Visit: PRN at  patient or provider request  Gennaro Africa, RDN, LDN Registered Dietitian, [facility] Part Time Remote (Usual office hours: Tuesday-Thursday) Mobile: [phone] Remote Office: [phone]

## 2023-03-06 ENCOUNTER — Encounter: Payer: Self-pay | Admitting: Hematology & Oncology

## 2023-03-08 ENCOUNTER — Other Ambulatory Visit: Payer: Self-pay

## 2023-03-09 ENCOUNTER — Ambulatory Visit (HOSPITAL_BASED_OUTPATIENT_CLINIC_OR_DEPARTMENT_OTHER)
Admission: RE | Admit: 2023-03-09 | Discharge: 2023-03-09 | Disposition: A | Discharge status: Home / Self Care | Payer: Medicare Other | Source: Ambulatory Visit | Attending: Hematology & Oncology | Admitting: Hematology & Oncology

## 2023-03-09 ENCOUNTER — Other Ambulatory Visit: Payer: Self-pay

## 2023-03-09 ENCOUNTER — Encounter: Payer: Self-pay | Admitting: Hematology & Oncology

## 2023-03-09 ENCOUNTER — Inpatient Hospital Stay: Payer: Medicare Other | Admitting: Hematology & Oncology

## 2023-03-09 ENCOUNTER — Other Ambulatory Visit (HOSPITAL_BASED_OUTPATIENT_CLINIC_OR_DEPARTMENT_OTHER): Payer: Self-pay

## 2023-03-09 VITALS — BP 150/93 | HR 104 | Temp 98.1°F | Resp 20 | Ht <= 58 in | Wt 220.0 lb

## 2023-03-09 DIAGNOSIS — C50911 Malignant neoplasm of unspecified site of right female breast: Secondary | ICD-10-CM

## 2023-03-09 DIAGNOSIS — C9001 Multiple myeloma in remission: Secondary | ICD-10-CM

## 2023-03-09 DIAGNOSIS — Z5111 Encounter for antineoplastic chemotherapy: Secondary | ICD-10-CM | POA: Diagnosis not present

## 2023-03-09 NOTE — Progress Notes (Signed)
Is Hematology and Oncology Follow Up Visit  Marcia [name] [id number] Dec 04, 1950 72 y.o. 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(+), Ki67 of 75% - pathologic CR to chemo --recurrent to bone Pulmonary Embolism/Bilateral LE DVT Recurrent Kappa light chain myeloma - status post stem cell transplant at [facility] in 07/2010    Past Therapy: Neoadjuvant carboplatinum/Taxotere Herceptin/Perjeta - s/p cycle 4 Radiation therapy to the right breast [facility]) Herceptin - adjuvant therapy started 08/18/2018  -- completed on 04/2019   Current Therapy:        Faspro/Velcade/Decadron -- start cycle #6 on 03/19/2022 -- d/c on 10/02/2022 Faspro/Kyprolis/Decadron -- s/p cycle #3  -- start on 10/14/2022 --Kyprolis dropped on 03/03/2023 Eliquis 2.5 mg po BID - started on 04/22/2019 - maintenance   Interim History:  Marcia [name] is here today for early follow-up.  She now has a lump on the lateral aspect of her left clavicle.  We we will get an x-ray on her today.  Unfortunate I do not have the results back as of yet.  I suspect that this is really the breast cancer.  We did liquid biopsies on her to see what her prognostic markers are.  We cannot get prognostic markers on the biopsy that was taken from the bone.  Hopefully, we will get this back soon.  I think Radiation Therapy would not be a bad idea for her.  I spoke to her Radiation Oncologist up in [place] who will be when happy to get her in.  She also wants to stop the Kyprolis.  They went to use something else for the myeloma of the Faspro.  I may think about Cytoxan.  This may not be a bad idea.  She is responding to treatment for the myeloma.  Her last Kappa light chain was down to 11.7 mg/dL.  She had a good weekend.  Grandson came to visit.  She has had no fever.  Her appetite has been pretty good.  She has had no problem with bowels or bladder.  She has had no  leg swelling.  There is been no rashes.  Overall, I would say that her performance status is probably ECOG 1.   Medications:  Allergies as of 03/09/2023       Reactions   Heparin Other (See Comments)   HIT   Sulfamethoxazole-trimethoprim Other (See Comments), Rash   ? Renal failure.   Zofran [ondansetron] Shortness Of Breath   "felt like throat was closing"   Clarithromycin Diarrhea, Nausea And Vomiting   Latex Rash   Macrodantin [nitrofurantoin] Rash        Medication List        Accurate as of March 09, 2023  8:18 AM. If you have any questions, ask your nurse or doctor.          acetaminophen 325 MG tablet Commonly known as: TYLENOL Take 650 mg by mouth daily as needed for moderate pain or headache.   amoxicillin 500 MG tablet Commonly known as: AMOXIL Take 2,000 mg by mouth once. Prior to dental work.   dexamethasone 6 MG tablet Commonly known as: DECADRON Take 2 tablets (12 mg total) by mouth daily. Take 30 minutes prior to treatment.   Eliquis 2.5 MG Tabs tablet Generic drug: apixaban TAKE 1 TABLET BY MOUTH TWICE A DAY   famciclovir 250 MG tablet Commonly known as: FAMVIR TAKE 1 TABLET BY MOUTH EVERY DAY   feeding supplement Liqd  Take 237 mLs by mouth 3 (three) times daily between meals.   folic acid 1 MG tablet Commonly known as: FOLVITE TAKE 2 TABLETS (2 MG TOTAL) BY MOUTH DAILY.   LORazepam 0.5 MG tablet Commonly known as: ATIVAN Take 1 tablet by mouth daily as needed.   losartan 25 MG tablet Commonly known as: COZAAR Take 25 mg by mouth daily.   montelukast 10 MG tablet Commonly known as: Singulair Take 1 tablet (10 mg total) by mouth at bedtime.   prochlorperazine 10 MG tablet Commonly known as: COMPAZINE Take 1 tablet (10 mg total) by mouth every 6 (six) hours as needed for nausea or vomiting.   traMADol 50 MG tablet Commonly known as: ULTRAM Take 1 tablet by mouth every 12 (twelve) hours as needed.   VITAMIN E PO daily.         Allergies:  Allergies  Allergen Reactions   Heparin Other (See Comments)    HIT   Sulfamethoxazole-Trimethoprim Other (See Comments) and Rash    ? Renal failure.   Zofran [Ondansetron] Shortness Of Breath    "felt like throat was closing"   Clarithromycin Diarrhea and Nausea And Vomiting   Latex Rash   Macrodantin [Nitrofurantoin] Rash    Past Medical History, Surgical history, Social history, and Family History were reviewed and updated.  Review of Systems: Review of Systems  Constitutional: Negative.   HENT: Negative.    Eyes: Negative.   Respiratory: Negative.    Cardiovascular: Negative.   Gastrointestinal: Negative.   Genitourinary: Negative.   Musculoskeletal: Negative.   Skin: Negative.   Neurological: Negative.   Endo/Heme/Allergies: Negative.   Psychiatric/Behavioral: Negative.        Physical Exam:  height is  (1.473 m) and weight is 220 lb 0.6 oz (99.8 kg). Her oral temperature is 98.1 F (36.7 C). Her blood pressure is 150/93 (abnormal) and her pulse is 104 (abnormal). Her respiration is 20 and oxygen saturation is 98%.   Wt Readings from Last 3 Encounters:  03/09/23 220 lb 0.6 oz (99.8 kg)  03/03/23 217 lb 12.8 oz (98.8 kg)  02/10/23 221 lb (100.2 kg)    Physical Exam Vitals reviewed.  HENT:     Head: Normocephalic and atraumatic.  Eyes:     Pupils: Pupils are equal, round, and reactive to light.  Cardiovascular:     Rate and Rhythm: Normal rate and regular rhythm.     Heart sounds: Normal heart sounds.  Pulmonary:     Effort: Pulmonary effort is normal.     Breath sounds: Normal breath sounds.  Abdominal:     General: Bowel sounds are normal.     Palpations: Abdomen is soft.  Musculoskeletal:        General: No tenderness or deformity. Normal range of motion.     Cervical back: Normal range of motion.     Comments: On the very lateral portion of the left clavicle, there is a nodule.  It is somewhat firm.  It is a little bit  tender.  There is no erythema.  Lymphadenopathy:     Cervical: No cervical adenopathy.  Skin:    General: Skin is warm and dry.     Findings: No erythema or rash.  Neurological:     Mental Status: She is alert and oriented to person, place, and time.  Psychiatric:        Behavior: Behavior normal.        Thought Content: Thought content normal.  Judgment: Judgment normal.    Lab Results  Component Value Date   WBC 4.6 03/03/2023   HGB 11.2 (L) 03/03/2023   HCT 33.8 (L) 03/03/2023   MCV 111.6 (H) 03/03/2023   PLT 148 (L) 03/03/2023   Lab Results  Component Value Date   FERRITIN 820 (H) 05/17/2018   IRON 272 (H) 05/17/2018   TIBC 295 05/17/2018   UIBC 23 05/17/2018   IRONPCTSAT 92 (H) 05/17/2018   Lab Results  Component Value Date   RETICCTPCT 1.3 03/27/2011   RBC 3.03 (L) 03/03/2023   RETICCTABS 56.4 03/27/2011   Lab Results  Component Value Date   KPAFRELGTCHN 116.9 (H) 02/17/2023   LAMBDASER 3.6 (L) 02/17/2023   KAPLAMBRATIO 32.47 (H) 02/17/2023   Lab Results  Component Value Date   IGGSERUM 248 (L) 02/17/2023   IGGSERUM 281 (L) 02/17/2023   IGA 10 (L) 02/17/2023   IGA 11 (L) 02/17/2023   IGMSERUM 10 (L) 02/17/2023   IGMSERUM 10 (L) 02/17/2023   Lab Results  Component Value Date   TOTALPROTELP 5.6 (L) 02/17/2023   ALBUMINELP 3.4 02/17/2023   A1GS 0.2 02/17/2023   A2GS 0.7 02/17/2023   BETS 1.0 02/17/2023   BETA2SER 0.4 07/02/2015   GAMS 0.3 (L) 02/17/2023   MSPIKE 0.1 (H) 02/17/2023   SPEI Comment 10/02/2022     Chemistry      Component Value Date/Time   NA 141 03/03/2023 1010   NA 141 07/06/2017 0816   NA 141 01/05/2017 0956   K 3.8 03/03/2023 1010   K 3.6 07/06/2017 0816   K 4.3 01/05/2017 0956   CL 107 03/03/2023 1010   CL 101 07/06/2017 0816   CO2 25 03/03/2023 1010   CO2 28 07/06/2017 0816   CO2 23 01/05/2017 0956   BUN 20 03/03/2023 1010   BUN 28 (H) 07/06/2017 0816   BUN 22.4 01/05/2017 0956   CREATININE 1.24 (H) 03/03/2023  1010   CREATININE 1.6 (H) 07/06/2017 0816   CREATININE 1.4 (H) 01/05/2017 0956      Component Value Date/Time   CALCIUM 8.9 03/03/2023 1010   CALCIUM 9.1 07/06/2017 0816   CALCIUM 9.3 01/05/2017 0956   ALKPHOS 70 03/03/2023 1010   ALKPHOS 58 07/06/2017 0816   ALKPHOS 52 01/05/2017 0956   AST 16 03/03/2023 1010   AST 14 01/05/2017 0956   ALT 15 03/03/2023 1010   ALT 58 (H) 07/06/2017 0816   ALT 13 01/05/2017 0956   BILITOT 0.7 03/03/2023 1010   BILITOT 0.56 01/05/2017 0956       Impression and Plan: Ms. Schwantz is a very pleasant 72 yo caucasian female with kappa light chain myeloma that has recurred again.   It now looks like we are now dealing with recurrent breast cancer.  The PET scan does show multiple areas of bony involvement.  Again, we will have to see about radiation therapy for the left clavicular lesion.  Liquid biopsy will clearly help [place] out.  We will see about adding Cytoxan to the Faspro.  Again she does not want Kyprolis.  I had ordered a echocardiogram for her.  Hopefully this will be done this week.  We will plan to get her back on the 25th.  I would think that radiation should be done by then.  Which she had back are molecular liquid biopsy results for her breast cancer.    This is quite complex.  I understand the difficulties that we have.  However, thankfully, Ms. Lenk is  in great shape that she would be able to tolerate therapy.    Josph Macho, MD 4/15/20248:18 AM

## 2023-03-11 ENCOUNTER — Other Ambulatory Visit: Payer: Self-pay

## 2023-03-11 ENCOUNTER — Ambulatory Visit (HOSPITAL_COMMUNITY): Payer: Medicare Other

## 2023-03-16 NOTE — Progress Notes (Signed)
Day 8 deleted from careplan per Dr. Gustavo Lah instructions.

## 2023-03-17 ENCOUNTER — Inpatient Hospital Stay: Payer: Medicare Other

## 2023-03-17 ENCOUNTER — Other Ambulatory Visit: Payer: Medicare Other

## 2023-03-17 ENCOUNTER — Ambulatory Visit: Payer: Medicare Other

## 2023-03-17 ENCOUNTER — Ambulatory Visit: Payer: Medicare Other | Admitting: Hematology & Oncology

## 2023-03-17 ENCOUNTER — Other Ambulatory Visit: Payer: Self-pay

## 2023-03-17 ENCOUNTER — Encounter: Payer: Self-pay | Admitting: Hematology & Oncology

## 2023-03-17 ENCOUNTER — Inpatient Hospital Stay (HOSPITAL_BASED_OUTPATIENT_CLINIC_OR_DEPARTMENT_OTHER): Payer: Medicare Other | Admitting: Hematology & Oncology

## 2023-03-17 VITALS — BP 141/69 | HR 76

## 2023-03-17 DIAGNOSIS — C9001 Multiple myeloma in remission: Secondary | ICD-10-CM | POA: Diagnosis not present

## 2023-03-17 DIAGNOSIS — C50911 Malignant neoplasm of unspecified site of right female breast: Secondary | ICD-10-CM

## 2023-03-17 DIAGNOSIS — Z5111 Encounter for antineoplastic chemotherapy: Secondary | ICD-10-CM | POA: Diagnosis not present

## 2023-03-17 LAB — CBC WITH DIFFERENTIAL (CANCER CENTER ONLY)
Abs Immature Granulocytes: 0.03 10*3/uL (ref 0.00–0.07)
Basophils Absolute: 0 10*3/uL (ref 0.0–0.1)
Basophils Relative: 1 %
Eosinophils Absolute: 0.1 10*3/uL (ref 0.0–0.5)
Eosinophils Relative: 2 %
HCT: 37.7 % (ref 36.0–46.0)
Hemoglobin: 12.8 g/dL (ref 12.0–15.0)
Immature Granulocytes: 0 %
Lymphocytes Relative: 33 %
Lymphs Abs: 2.4 10*3/uL (ref 0.7–4.0)
MCH: 37.1 pg — ABNORMAL HIGH (ref 26.0–34.0)
MCHC: 34 g/dL (ref 30.0–36.0)
MCV: 109.3 fL — ABNORMAL HIGH (ref 80.0–100.0)
Monocytes Absolute: 0.5 10*3/uL (ref 0.1–1.0)
Monocytes Relative: 7 %
Neutro Abs: 4.2 10*3/uL (ref 1.7–7.7)
Neutrophils Relative %: 57 %
Platelet Count: 171 10*3/uL (ref 150–400)
RBC: 3.45 MIL/uL — ABNORMAL LOW (ref 3.87–5.11)
RDW: 13.5 % (ref 11.5–15.5)
WBC Count: 7.2 10*3/uL (ref 4.0–10.5)
nRBC: 0 % (ref 0.0–0.2)

## 2023-03-17 LAB — CMP (CANCER CENTER ONLY)
ALT: 11 U/L (ref 0–44)
AST: 15 U/L (ref 15–41)
Albumin: 3.9 g/dL (ref 3.5–5.0)
Alkaline Phosphatase: 70 U/L (ref 38–126)
Anion gap: 12 (ref 5–15)
BUN: 18 mg/dL (ref 8–23)
CO2: 24 mmol/L (ref 22–32)
Calcium: 8.8 mg/dL — ABNORMAL LOW (ref 8.9–10.3)
Chloride: 104 mmol/L (ref 98–111)
Creatinine: 1.3 mg/dL — ABNORMAL HIGH (ref 0.44–1.00)
GFR, Estimated: 43 mL/min — ABNORMAL LOW (ref >60)
Glucose, Bld: 107 mg/dL — ABNORMAL HIGH (ref 70–99)
Potassium: 3.9 mmol/L (ref 3.5–5.1)
Sodium: 140 mmol/L (ref 135–145)
Total Bilirubin: 0.7 mg/dL (ref 0.3–1.2)
Total Protein: 6 g/dL — ABNORMAL LOW (ref 6.5–8.1)

## 2023-03-17 LAB — LACTATE DEHYDROGENASE: LDH: 206 U/L — ABNORMAL HIGH (ref 98–192)

## 2023-03-17 MED ORDER — DARATUMUMAB-HYALURONIDASE-FIHJ 1800-30000 MG-UT/15ML ~~LOC~~ SOLN
1800.0000 mg | Freq: Once | SUBCUTANEOUS | Status: AC
  Administered 2023-03-17: 1800 mg via SUBCUTANEOUS
  Filled 2023-03-17: qty 15

## 2023-03-17 MED ORDER — ACETAMINOPHEN 325 MG PO TABS: 650.0000 mg | ORAL_TABLET | Freq: Once | ORAL | Status: DC

## 2023-03-17 MED ORDER — SODIUM CHLORIDE 0.9 % IV SOLN
400.0000 mg/m2 | Freq: Once | INTRAVENOUS | Status: AC
  Administered 2023-03-17: 820 mg via INTRAVENOUS
  Filled 2023-03-17: qty 22

## 2023-03-17 MED ORDER — DIPHENHYDRAMINE HCL 25 MG PO CAPS: 50.0000 mg | ORAL_CAPSULE | Freq: Once | ORAL | Status: DC

## 2023-03-17 MED ORDER — SODIUM CHLORIDE 0.9 % IV SOLN: Freq: Once | INTRAVENOUS | Status: AC

## 2023-03-17 MED ORDER — DEXAMETHASONE 4 MG PO TABS: 12.0000 mg | ORAL_TABLET | Freq: Once | ORAL | Status: DC

## 2023-03-17 NOTE — Patient Instructions (Signed)

## 2023-03-17 NOTE — Progress Notes (Signed)
Is Hematology and Oncology Follow Up Visit  JATZIRI GOFFREDO [id number] 1950/12/19 72 y.o. 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(+), Ki67 of 75% - pathologic CR to chemo --recurrent to bone Pulmonary Embolism/Bilateral LE DVT Recurrent Kappa light chain myeloma - status post stem cell transplant at [facility] in 07/2010    Past Therapy: Neoadjuvant carboplatinum/Taxotere Herceptin/Perjeta - s/p cycle 4 Radiation therapy to the right breast [facility]) Herceptin - adjuvant therapy started 08/18/2018  -- completed on 04/2019   Current Therapy:        Faspro/Velcade/Decadron -- start cycle #6 on 03/19/2022 -- d/c on 10/02/2022 Faspro/Kyprolis/Decadron -- s/p cycle #3  -- start on 10/14/2022 --Kyprolis dropped on 03/03/2023 Eliquis 2.5 mg po BID - started on 04/22/2019 - maintenance   Interim History:  Ms. Hinchey is here today for early follow-up.  She now has a lump on the lateral aspect of her left clavicle.  We we will get an x-ray on her today.  Unfortunate I do not have the results back as of yet.  I suspect that this is really the breast cancer.  We did liquid biopsies on her to see what her prognostic markers are.  We cannot get prognostic markers on the biopsy that was taken from the bone.  Hopefully, we will get this back soon.  I think Radiation Therapy would not be a bad idea for her.  I spoke to her Radiation Oncologist up in [place] who will be when happy to get her in.  She also wants to stop the Kyprolis.  They went to use something else for the myeloma of the Faspro.  I may think about Cytoxan.  This may not be a bad idea.  She is responding to treatment for the myeloma.  Her last Kappa light chain was down to 11.7 mg/dL.  She had a good weekend.  Grandson came to visit.  She has had no fever.  Her appetite has been pretty good.  She has had no problem with bowels or bladder.  She has had no  leg swelling.  There is been no rashes.  Overall, I would say that her performance status is probably ECOG 1.   Medications:  Allergies as of 03/17/2023       Reactions   Heparin Other (See Comments)   HIT   Sulfamethoxazole-trimethoprim Other (See Comments), Rash   ? Renal failure.   Zofran [ondansetron] Shortness Of Breath   "felt like throat was closing"   Clarithromycin Diarrhea, Nausea And Vomiting   Latex Rash   Macrodantin [nitrofurantoin] Rash        Medication List        Accurate as of March 17, 2023 12:02 PM. If you have any questions, ask your nurse or doctor.          acetaminophen 325 MG tablet Commonly known as: TYLENOL Take 650 mg by mouth daily as needed for moderate pain or headache.   amoxicillin 500 MG tablet Commonly known as: AMOXIL Take 2,000 mg by mouth once. Prior to dental work.   dexamethasone 6 MG tablet Commonly known as: DECADRON Take 2 tablets (12 mg total) by mouth daily. Take 30 minutes prior to treatment.   Eliquis 2.5 MG Tabs tablet Generic drug: apixaban TAKE 1 TABLET BY MOUTH TWICE A DAY   famciclovir 250 MG tablet Commonly known as: FAMVIR TAKE 1 TABLET BY MOUTH EVERY DAY   feeding supplement Liqd Take  237 mLs by mouth 3 (three) times daily between meals.   folic acid 1 MG tablet Commonly known as: FOLVITE TAKE 2 TABLETS (2 MG TOTAL) BY MOUTH DAILY.   LORazepam 0.5 MG tablet Commonly known as: ATIVAN Take 1 tablet by mouth daily as needed.   losartan 25 MG tablet Commonly known as: COZAAR Take 25 mg by mouth daily.   montelukast 10 MG tablet Commonly known as: Singulair Take 1 tablet (10 mg total) by mouth at bedtime.   prochlorperazine 10 MG tablet Commonly known as: COMPAZINE Take 1 tablet (10 mg total) by mouth every 6 (six) hours as needed for nausea or vomiting.   traMADol 50 MG tablet Commonly known as: ULTRAM Take 1 tablet by mouth every 12 (twelve) hours as needed.   VITAMIN E PO daily.         Allergies:  Allergies  Allergen Reactions  . Heparin Other (See Comments)    HIT  . Sulfamethoxazole-Trimethoprim Other (See Comments) and Rash    ? Renal failure.  . Zofran [Ondansetron] Shortness Of Breath    "felt like throat was closing"  . Clarithromycin Diarrhea and Nausea And Vomiting  . Latex Rash  . Macrodantin [Nitrofurantoin] Rash    Past Medical History, Surgical history, Social history, and Family History were reviewed and updated.  Review of Systems: Review of Systems  Constitutional: Negative.   HENT: Negative.    Eyes: Negative.   Respiratory: Negative.    Cardiovascular: Negative.   Gastrointestinal: Negative.   Genitourinary: Negative.   Musculoskeletal: Negative.   Skin: Negative.   Neurological: Negative.   Endo/Heme/Allergies: Negative.   Psychiatric/Behavioral: Negative.        Physical Exam:  height is 4\' 10"  (1.473 m) and weight is 219 lb (99.3 kg). Her oral temperature is 98.3 F (36.8 C). Her blood pressure is 120/54 (abnormal) and her pulse is 98. Her respiration is 17 and oxygen saturation is 98%.   Wt Readings from Last 3 Encounters:  03/17/23 219 lb (99.3 kg)  03/09/23 220 lb 0.6 oz (99.8 kg)  03/03/23 217 lb 12.8 oz (98.8 kg)    Physical Exam Vitals reviewed.  HENT:     Head: Normocephalic and atraumatic.  Eyes:     Pupils: Pupils are equal, round, and reactive to light.  Cardiovascular:     Rate and Rhythm: Normal rate and regular rhythm.     Heart sounds: Normal heart sounds.  Pulmonary:     Effort: Pulmonary effort is normal.     Breath sounds: Normal breath sounds.  Abdominal:     General: Bowel sounds are normal.     Palpations: Abdomen is soft.  Musculoskeletal:        General: No tenderness or deformity. Normal range of motion.     Cervical back: Normal range of motion.     Comments: On the very lateral portion of the left clavicle, there is a nodule.  It is somewhat firm.  It is a little bit tender.  There is no  erythema.  Lymphadenopathy:     Cervical: No cervical adenopathy.  Skin:    General: Skin is warm and dry.     Findings: No erythema or rash.  Neurological:     Mental Status: She is alert and oriented to person, place, and time.  Psychiatric:        Behavior: Behavior normal.        Thought Content: Thought content normal.  Judgment: Judgment normal.    Lab Results  Component Value Date   WBC 7.2 03/17/2023   HGB 12.8 03/17/2023   HCT 37.7 03/17/2023   MCV 109.3 (H) 03/17/2023   PLT 171 03/17/2023   Lab Results  Component Value Date   FERRITIN 820 (H) 05/17/2018   IRON 272 (H) 05/17/2018   TIBC 295 05/17/2018   UIBC 23 05/17/2018   IRONPCTSAT 92 (H) 05/17/2018   Lab Results  Component Value Date   RETICCTPCT 1.3 03/27/2011   RBC 3.45 (L) 03/17/2023   RETICCTABS 56.4 03/27/2011   Lab Results  Component Value Date   KPAFRELGTCHN 116.9 (H) 02/17/2023   LAMBDASER 3.6 (L) 02/17/2023   KAPLAMBRATIO 32.47 (H) 02/17/2023   Lab Results  Component Value Date   IGGSERUM 248 (L) 02/17/2023   IGGSERUM 281 (L) 02/17/2023   IGA 10 (L) 02/17/2023   IGA 11 (L) 02/17/2023   IGMSERUM 10 (L) 02/17/2023   IGMSERUM 10 (L) 02/17/2023   Lab Results  Component Value Date   TOTALPROTELP 5.6 (L) 02/17/2023   ALBUMINELP 3.4 02/17/2023   A1GS 0.2 02/17/2023   A2GS 0.7 02/17/2023   BETS 1.0 02/17/2023   BETA2SER 0.4 07/02/2015   GAMS 0.3 (L) 02/17/2023   MSPIKE 0.1 (H) 02/17/2023   SPEI Comment 10/02/2022     Chemistry      Component Value Date/Time   NA 140 03/17/2023 1009   NA 141 07/06/2017 0816   NA 141 01/05/2017 0956   K 3.9 03/17/2023 1009   K 3.6 07/06/2017 0816   K 4.3 01/05/2017 0956   CL 104 03/17/2023 1009   CL 101 07/06/2017 0816   CO2 24 03/17/2023 1009   CO2 28 07/06/2017 0816   CO2 23 01/05/2017 0956   BUN 18 03/17/2023 1009   BUN 28 (H) 07/06/2017 0816   BUN 22.4 01/05/2017 0956   CREATININE 1.30 (H) 03/17/2023 1009   CREATININE 1.6 (H)  07/06/2017 0816   CREATININE 1.4 (H) 01/05/2017 0956      Component Value Date/Time   CALCIUM 8.8 (L) 03/17/2023 1009   CALCIUM 9.1 07/06/2017 0816   CALCIUM 9.3 01/05/2017 0956   ALKPHOS 70 03/17/2023 1009   ALKPHOS 58 07/06/2017 0816   ALKPHOS 52 01/05/2017 0956   AST 15 03/17/2023 1009   AST 14 01/05/2017 0956   ALT 11 03/17/2023 1009   ALT 58 (H) 07/06/2017 0816   ALT 13 01/05/2017 0956   BILITOT 0.7 03/17/2023 1009   BILITOT 0.56 01/05/2017 0956       Impression and Plan: Ms. Mayer is a very pleasant 72 yo caucasian female with kappa light chain myeloma that has recurred again.   It now looks like we are now dealing with recurrent breast cancer.  The PET scan does show multiple areas of bony involvement.  Again, we will have to see about radiation therapy for the left clavicular lesion.  Liquid biopsy will clearly help [place] out.  We will see about adding Cytoxan to the Faspro.  Again she does not want Kyprolis.  I had ordered a echocardiogram for her.  Hopefully this will be done this week.  We will plan to get her back on the 25th.  I would think that radiation should be done by then.  Which she had back are molecular liquid biopsy results for her breast cancer.    This is quite complex.  I understand the difficulties that we have.  However, thankfully, Ms. Blume is in great  shape that she would be able to tolerate therapy.    Josph Macho, MD 4/23/202412:02 PM

## 2023-03-18 ENCOUNTER — Other Ambulatory Visit: Payer: Self-pay

## 2023-03-18 LAB — IGG, IGA, IGM
IgA: 10 mg/dL — ABNORMAL LOW (ref 64–422)
IgG (Immunoglobin G), Serum: 218 mg/dL — ABNORMAL LOW (ref 586–1602)
IgM (Immunoglobulin M), Srm: 7 mg/dL — ABNORMAL LOW (ref 26–217)

## 2023-03-18 LAB — KAPPA/LAMBDA LIGHT CHAINS
Kappa free light chain: 224.2 mg/L — ABNORMAL HIGH (ref 3.3–19.4)
Kappa, lambda light chain ratio: 37.37 — ABNORMAL HIGH (ref 0.26–1.65)
Lambda free light chains: 6 mg/L (ref 5.7–26.3)

## 2023-03-18 LAB — CANCER ANTIGEN 27.29: CA 27.29: 98.3 U/mL — ABNORMAL HIGH (ref 0.0–38.6)

## 2023-03-20 LAB — PROTEIN ELECTROPHORESIS, SERUM, WITH REFLEX
A/G Ratio: 1.4 (ref 0.7–1.7)
Albumin ELP: 3.4 g/dL (ref 2.9–4.4)
Alpha-1-Globulin: 0.2 g/dL (ref 0.0–0.4)
Alpha-2-Globulin: 0.8 g/dL (ref 0.4–1.0)
Beta Globulin: 1.2 g/dL (ref 0.7–1.3)
Gamma Globulin: 0.2 g/dL — ABNORMAL LOW (ref 0.4–1.8)
Globulin, Total: 2.4 g/dL (ref 2.2–3.9)
Total Protein ELP: 5.8 g/dL — ABNORMAL LOW (ref 6.0–8.5)

## 2023-03-24 ENCOUNTER — Ambulatory Visit: Payer: Medicare Other

## 2023-03-24 ENCOUNTER — Other Ambulatory Visit: Payer: Medicare Other

## 2023-03-24 ENCOUNTER — Ambulatory Visit: Payer: Medicare Other | Admitting: Hematology & Oncology

## 2023-03-31 ENCOUNTER — Encounter: Payer: Self-pay | Admitting: Hematology & Oncology

## 2023-03-31 ENCOUNTER — Inpatient Hospital Stay: Payer: Medicare Other | Attending: Hematology & Oncology

## 2023-03-31 ENCOUNTER — Other Ambulatory Visit: Payer: Self-pay | Admitting: Oncology

## 2023-03-31 ENCOUNTER — Inpatient Hospital Stay: Payer: Medicare Other

## 2023-03-31 ENCOUNTER — Inpatient Hospital Stay (HOSPITAL_BASED_OUTPATIENT_CLINIC_OR_DEPARTMENT_OTHER): Payer: Medicare Other | Admitting: Hematology & Oncology

## 2023-03-31 VITALS — BP 123/67 | HR 83 | Temp 98.2°F | Resp 20

## 2023-03-31 VITALS — Wt 220.0 lb

## 2023-03-31 DIAGNOSIS — C9001 Multiple myeloma in remission: Secondary | ICD-10-CM | POA: Diagnosis not present

## 2023-03-31 DIAGNOSIS — C9000 Multiple myeloma not having achieved remission: Secondary | ICD-10-CM | POA: Insufficient documentation

## 2023-03-31 DIAGNOSIS — Z171 Estrogen receptor negative status [ER-]: Secondary | ICD-10-CM | POA: Insufficient documentation

## 2023-03-31 DIAGNOSIS — C50911 Malignant neoplasm of unspecified site of right female breast: Secondary | ICD-10-CM

## 2023-03-31 DIAGNOSIS — C7951 Secondary malignant neoplasm of bone: Secondary | ICD-10-CM | POA: Insufficient documentation

## 2023-03-31 DIAGNOSIS — Z86711 Personal history of pulmonary embolism: Secondary | ICD-10-CM | POA: Diagnosis not present

## 2023-03-31 DIAGNOSIS — Z7901 Long term (current) use of anticoagulants: Secondary | ICD-10-CM | POA: Diagnosis not present

## 2023-03-31 DIAGNOSIS — Z7951 Long term (current) use of inhaled steroids: Secondary | ICD-10-CM | POA: Insufficient documentation

## 2023-03-31 DIAGNOSIS — Z9484 Stem cells transplant status: Secondary | ICD-10-CM | POA: Insufficient documentation

## 2023-03-31 DIAGNOSIS — Z86718 Personal history of other venous thrombosis and embolism: Secondary | ICD-10-CM | POA: Diagnosis not present

## 2023-03-31 LAB — CMP (CANCER CENTER ONLY)
ALT: 10 U/L (ref 0–44)
AST: 13 U/L — ABNORMAL LOW (ref 15–41)
Albumin: 4.2 g/dL (ref 3.5–5.0)
Alkaline Phosphatase: 72 U/L (ref 38–126)
Anion gap: 9 (ref 5–15)
BUN: 23 mg/dL (ref 8–23)
CO2: 26 mmol/L (ref 22–32)
Calcium: 9.1 mg/dL (ref 8.9–10.3)
Chloride: 105 mmol/L (ref 98–111)
Creatinine: 1.34 mg/dL — ABNORMAL HIGH (ref 0.44–1.00)
GFR, Estimated: 42 mL/min — ABNORMAL LOW (ref >60)
Glucose, Bld: 109 mg/dL — ABNORMAL HIGH (ref 70–99)
Potassium: 4 mmol/L (ref 3.5–5.1)
Sodium: 140 mmol/L (ref 135–145)
Total Bilirubin: 0.6 mg/dL (ref 0.3–1.2)
Total Protein: 6.3 g/dL — ABNORMAL LOW (ref 6.5–8.1)

## 2023-03-31 LAB — CBC WITH DIFFERENTIAL (CANCER CENTER ONLY)
Abs Immature Granulocytes: 0.02 10*3/uL (ref 0.00–0.07)
Basophils Absolute: 0 10*3/uL (ref 0.0–0.1)
Basophils Relative: 1 %
Eosinophils Absolute: 0.1 10*3/uL (ref 0.0–0.5)
Eosinophils Relative: 1 %
HCT: 36.6 % (ref 36.0–46.0)
Hemoglobin: 12.4 g/dL (ref 12.0–15.0)
Immature Granulocytes: 0 %
Lymphocytes Relative: 32 %
Lymphs Abs: 1.7 10*3/uL (ref 0.7–4.0)
MCH: 35.9 pg — ABNORMAL HIGH (ref 26.0–34.0)
MCHC: 33.9 g/dL (ref 30.0–36.0)
MCV: 106.1 fL — ABNORMAL HIGH (ref 80.0–100.0)
Monocytes Absolute: 0.3 10*3/uL (ref 0.1–1.0)
Monocytes Relative: 5 %
Neutro Abs: 3.2 10*3/uL (ref 1.7–7.7)
Neutrophils Relative %: 61 %
Platelet Count: 185 10*3/uL (ref 150–400)
RBC: 3.45 MIL/uL — ABNORMAL LOW (ref 3.87–5.11)
RDW: 13.3 % (ref 11.5–15.5)
WBC Count: 5.2 10*3/uL (ref 4.0–10.5)
nRBC: 0 % (ref 0.0–0.2)

## 2023-03-31 MED ORDER — DEXAMETHASONE 4 MG PO TABS: 12.0000 mg | ORAL_TABLET | Freq: Once | ORAL | Status: DC

## 2023-03-31 MED ORDER — ACETAMINOPHEN 325 MG PO TABS: 650.0000 mg | ORAL_TABLET | Freq: Once | ORAL | Status: DC

## 2023-03-31 MED ORDER — HEPARIN SOD (PORK) LOCK FLUSH 100 UNIT/ML IV SOLN: 500.0000 [IU] | Freq: Once | INTRAVENOUS | Status: DC | PRN

## 2023-03-31 MED ORDER — SODIUM CHLORIDE 0.9% FLUSH
10.0000 mL | INTRAVENOUS | Status: DC | PRN
  Administered 2023-03-31: 10 mL

## 2023-03-31 MED ORDER — DEXAMETHASONE 6 MG PO TABS
12.0000 mg | ORAL_TABLET | Freq: Every day | ORAL | 1 refills | Status: DC
Start: 2023-03-31 — End: 2023-05-21

## 2023-03-31 MED ORDER — DIPHENHYDRAMINE HCL 25 MG PO CAPS: 50.0000 mg | ORAL_CAPSULE | Freq: Once | ORAL | Status: DC

## 2023-03-31 MED ORDER — SODIUM CHLORIDE 0.9 % IV SOLN
400.0000 mg/m2 | Freq: Once | INTRAVENOUS | Status: DC
  Filled 2023-03-31: qty 41

## 2023-03-31 MED ORDER — SODIUM CHLORIDE 0.9 % IV SOLN: Freq: Once | INTRAVENOUS | Status: DC

## 2023-03-31 NOTE — Patient Instructions (Signed)

## 2023-03-31 NOTE — Progress Notes (Signed)
No treatment today per dr. Ennever 

## 2023-04-11 ENCOUNTER — Other Ambulatory Visit: Payer: Self-pay

## 2023-04-13 ENCOUNTER — Other Ambulatory Visit: Payer: Self-pay | Admitting: Hematology & Oncology

## 2023-04-13 ENCOUNTER — Ambulatory Visit: Payer: Medicare Other | Attending: Hematology & Oncology

## 2023-04-13 DIAGNOSIS — Z5111 Encounter for antineoplastic chemotherapy: Secondary | ICD-10-CM

## 2023-04-13 DIAGNOSIS — Z0189 Encounter for other specified special examinations: Secondary | ICD-10-CM | POA: Diagnosis not present

## 2023-04-13 DIAGNOSIS — C50911 Malignant neoplasm of unspecified site of right female breast: Secondary | ICD-10-CM

## 2023-04-13 LAB — ECHOCARDIOGRAM COMPLETE
AR max vel: 2.26 cm2
AV Area VTI: 2.62 cm2
AV Area mean vel: 2.44 cm2
AV Mean grad: 3 mmHg
AV Peak grad: 6.7 mmHg
Ao pk vel: 1.29 m/s
Area-P 1/2: 4.41 cm2
Calc EF: 60.9 %
S' Lateral: 3 cm
Single Plane A2C EF: 56.2 %
Single Plane A4C EF: 66.1 %

## 2023-04-14 ENCOUNTER — Encounter: Payer: Self-pay | Admitting: *Deleted

## 2023-04-15 ENCOUNTER — Other Ambulatory Visit (HOSPITAL_COMMUNITY): Payer: Self-pay

## 2023-04-15 ENCOUNTER — Encounter: Payer: Self-pay | Admitting: Hematology & Oncology

## 2023-04-15 ENCOUNTER — Other Ambulatory Visit: Payer: Self-pay

## 2023-04-15 ENCOUNTER — Inpatient Hospital Stay: Payer: Medicare Other | Admitting: Hematology & Oncology

## 2023-04-15 VITALS — BP 134/79 | HR 83 | Temp 98.0°F | Resp 18 | Ht <= 58 in | Wt 221.1 lb

## 2023-04-15 DIAGNOSIS — C9001 Multiple myeloma in remission: Secondary | ICD-10-CM | POA: Diagnosis not present

## 2023-04-15 DIAGNOSIS — C50911 Malignant neoplasm of unspecified site of right female breast: Secondary | ICD-10-CM | POA: Diagnosis not present

## 2023-04-15 DIAGNOSIS — C9000 Multiple myeloma not having achieved remission: Secondary | ICD-10-CM | POA: Diagnosis not present

## 2023-04-15 MED ORDER — SELINEXOR (80 MG ONCE WEEKLY) 40 MG PO TBPK
80.0000 mg | ORAL_TABLET | ORAL | 3 refills | Status: DC
  Filled 2023-04-15: qty 8, 28d supply, fill #0

## 2023-04-15 NOTE — Progress Notes (Signed)
Is Hematology and Oncology Follow Up Visit  [name] [id number] 1951/08/23 72 y.o. 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 at [facility] in 07/2010    Past Therapy: Neoadjuvant carboplatinum/Taxotere Herceptin/Perjeta - s/p cycle 4 Radiation therapy to the right breast [facility]) Herceptin - adjuvant therapy started 08/18/2018  -- completed on 04/2019   Current Therapy:        Faspro/Velcade/Decadron -- start cycle #6 on 03/19/2022 -- d/c on 10/02/2022 Faspro/Kyprolis/Decadron -- s/p cycle #3  -- start on 10/14/2022 --Kyprolis dropped on 03/03/2023 Eliquis 2.5 mg po BID - started on 04/22/2019 - maintenance Selinexor 80 mg po q week -- start on 04/20/2023 Xgeva 120 mg sq q 3 months XRT to affected joints   Interim History:  [name] is here today for follow-up.  We are now dealing with 2 malignancies.  She has metastatic breast cancer.  Thankfully, on the PET scan that she had done, she only has 4 areas in her bones that hopefully can be radiated.  I would think that would be the easiest treatment for her.  She now has active myeloma.  When we last saw her, her Kappa light chain was 22.4 mg/dL.  As far as treating the myeloma, I really think that a good way of treating this might be with all CAR-T therapy.  I spoke with Dr. Jacqulyn Bath at [facility].  He has seen her in the past.  Has been quite a while since he has seen her.  She did undergo a stem cell transplant 13 years ago.  He thinks that despite her having the breast cancer, that she would still be a candidate for treating the myeloma with CAR-T therapy.  1 option that we talked about was treated her before hand.  I think that Selinexor would not be a bad idea for her.  This would be  oral.  She should be able to tolerate weekly therapy.  She comes in with her husband.  I explained to her my recommendations for treatment.  Again, I think that CAR-T would not be a bad idea for her.  We will start her off on Selinexor.  I am not sure when she would be able to have CAR-T therapy.  It may take a few months to get everything arranged.  As far as her metastatic breast cancer, I will have to speak with the Radiation Oncologist up in [place] and see if they can give any more radiation to the areas of bone involvement that were seen on the PET scan.  She really does not complain of any pain.  She does feel tired.  She has no change in bowel or bladder habits.  She has had no cough or shortness of breath.  She has had no nausea or vomiting.  She has had no rashes.  There is been no bleeding.  Overall, I would have to say that her performance status is probably ECOG 1.   Medications:  Allergies as of 04/15/2023       Reactions   Heparin Other (See Comments)   HIT   Sulfamethoxazole-trimethoprim Other (See Comments), Rash   ? Renal failure.   Zofran [ondansetron] Shortness Of Breath   "felt like throat was closing"   Clarithromycin Diarrhea, Nausea And Vomiting   Latex  Rash   Macrodantin [nitrofurantoin] Rash        Medication List        Accurate as of Apr 15, 2023  5:09 PM. If you have any questions, ask your nurse or doctor.          acetaminophen 325 MG tablet Commonly known as: TYLENOL Take 650 mg by mouth daily as needed for moderate pain or headache.   amoxicillin 500 MG tablet Commonly known as: AMOXIL Take 2,000 mg by mouth once. Prior to dental work.   dexamethasone 6 MG tablet Commonly known as: DECADRON Take 2 tablets (12 mg total) by mouth daily. Take 30 minutes prior to treatment.   Eliquis 2.5 MG Tabs tablet Generic drug: apixaban TAKE 1 TABLET BY MOUTH TWICE A DAY   famciclovir 250 MG tablet Commonly known as: FAMVIR TAKE 1 TABLET BY MOUTH  EVERY DAY   feeding supplement Liqd Take 237 mLs by mouth 3 (three) times daily between meals.   folic acid 1 MG tablet Commonly known as: FOLVITE TAKE 2 TABLETS (2 MG TOTAL) BY MOUTH DAILY.   LORazepam 0.5 MG tablet Commonly known as: ATIVAN Take 1 tablet by mouth daily as needed.   losartan 25 MG tablet Commonly known as: COZAAR Take 25 mg by mouth daily.   montelukast 10 MG tablet Commonly known as: Singulair Take 1 tablet (10 mg total) by mouth at bedtime.   prochlorperazine 10 MG tablet Commonly known as: COMPAZINE Take 1 tablet (10 mg total) by mouth every 6 (six) hours as needed for nausea or vomiting.   traMADol 50 MG tablet Commonly known as: ULTRAM Take 1 tablet by mouth every 12 (twelve) hours as needed.   VITAMIN E PO daily.        Allergies:  Allergies  Allergen Reactions   Heparin Other (See Comments)    HIT   Sulfamethoxazole-Trimethoprim Other (See Comments) and Rash    ? Renal failure.   Zofran [Ondansetron] Shortness Of Breath    "felt like throat was closing"   Clarithromycin Diarrhea and Nausea And Vomiting   Latex Rash   Macrodantin [Nitrofurantoin] Rash    Past Medical History, Surgical history, Social history, and Family History were reviewed and updated.  Review of Systems: Review of Systems  Constitutional: Negative.   HENT: Negative.    Eyes: Negative.   Respiratory: Negative.    Cardiovascular: Negative.   Gastrointestinal: Negative.   Genitourinary: Negative.   Musculoskeletal: Negative.   Skin: Negative.   Neurological: Negative.   Endo/Heme/Allergies: Negative.   Psychiatric/Behavioral: Negative.        Physical Exam:  height is 4\' 10"  (1.473 m) and weight is 221 lb 1.9 oz (100.3 kg). Her oral temperature is 98 F (36.7 C). Her blood pressure is 134/79 and her pulse is 83. Her respiration is 18 and oxygen saturation is 98%.   Wt Readings from Last 3 Encounters:  04/15/23 221 lb 1.9 oz (100.3 kg)  03/31/23 220 lb  (99.8 kg)  03/17/23 219 lb (99.3 kg)    Physical Exam Vitals reviewed.  HENT:     Head: Normocephalic and atraumatic.  Eyes:     Pupils: Pupils are equal, round, and reactive to light.  Cardiovascular:     Rate and Rhythm: Normal rate and regular rhythm.     Heart sounds: Normal heart sounds.  Pulmonary:     Effort: Pulmonary effort is normal.     Breath sounds: Normal breath sounds.  Abdominal:  General: Bowel sounds are normal.     Palpations: Abdomen is soft.  Musculoskeletal:        General: No tenderness or deformity. Normal range of motion.     Cervical back: Normal range of motion.     Comments: On the very lateral portion of the left clavicle, there is a nodule.  It is somewhat firm.  It is a little bit tender.  There is no erythema.  Lymphadenopathy:     Cervical: No cervical adenopathy.  Skin:    General: Skin is warm and dry.     Findings: No erythema or rash.  Neurological:     Mental Status: She is alert and oriented to person, place, and time.  Psychiatric:        Behavior: Behavior normal.        Thought Content: Thought content normal.        Judgment: Judgment normal.     Lab Results  Component Value Date   WBC 5.2 03/31/2023   HGB 12.4 03/31/2023   HCT 36.6 03/31/2023   MCV 106.1 (H) 03/31/2023   PLT 185 03/31/2023   Lab Results  Component Value Date   FERRITIN 820 (H) 05/17/2018   IRON 272 (H) 05/17/2018   TIBC 295 05/17/2018   UIBC 23 05/17/2018   IRONPCTSAT 92 (H) 05/17/2018   Lab Results  Component Value Date   RETICCTPCT 1.3 03/27/2011   RBC 3.45 (L) 03/31/2023   RETICCTABS 56.4 03/27/2011   Lab Results  Component Value Date   KPAFRELGTCHN 224.2 (H) 03/17/2023   LAMBDASER 6.0 03/17/2023   KAPLAMBRATIO 37.37 (H) 03/17/2023   Lab Results  Component Value Date   IGGSERUM 218 (L) 03/17/2023   IGA 10 (L) 03/17/2023   IGMSERUM 7 (L) 03/17/2023   Lab Results  Component Value Date   TOTALPROTELP 5.8 (L) 03/17/2023    ALBUMINELP 3.4 03/17/2023   A1GS 0.2 03/17/2023   A2GS 0.8 03/17/2023   BETS 1.2 03/17/2023   BETA2SER 0.4 07/02/2015   GAMS 0.2 (L) 03/17/2023   MSPIKE Not Observed 03/17/2023   SPEI Comment 10/02/2022     Chemistry      Component Value Date/Time   NA 140 03/31/2023 1030   NA 141 07/06/2017 0816   NA 141 01/05/2017 0956   K 4.0 03/31/2023 1030   K 3.6 07/06/2017 0816   K 4.3 01/05/2017 0956   CL 105 03/31/2023 1030   CL 101 07/06/2017 0816   CO2 26 03/31/2023 1030   CO2 28 07/06/2017 0816   CO2 23 01/05/2017 0956   BUN 23 03/31/2023 1030   BUN 28 (H) 07/06/2017 0816   BUN 22.4 01/05/2017 0956   CREATININE 1.34 (H) 03/31/2023 1030   CREATININE 1.6 (H) 07/06/2017 0816   CREATININE 1.4 (H) 01/05/2017 0956      Component Value Date/Time   CALCIUM 9.1 03/31/2023 1030   CALCIUM 9.1 07/06/2017 0816   CALCIUM 9.3 01/05/2017 0956   ALKPHOS 72 03/31/2023 1030   ALKPHOS 58 07/06/2017 0816   ALKPHOS 52 01/05/2017 0956   AST 13 (L) 03/31/2023 1030   AST 14 01/05/2017 0956   ALT 10 03/31/2023 1030   ALT 58 (H) 07/06/2017 0816   ALT 13 01/05/2017 0956   BILITOT 0.6 03/31/2023 1030   BILITOT 0.56 01/05/2017 0956       Impression and Plan: Ms. Schlageter is a very pleasant 72 yo caucasian female with kappa light chain myeloma.  She had recurrent disease.  We  treated this for quite a while with systemic chemotherapy.  However, it looks like this is progressing.  We will see about the Selinexor.  I will also then see about referring her back out to [facility] for consideration of CAR-T therapy.  As far as the metastatic breast cancer is concerned, I would like to try to get her treated with radiation therapy.  She had radiation to the left clavicle and this worked quite well.  The lump really is no longer there.  At some point, I am sure that we will have to be more aggressive with her breast cancer and then treat her systemically with chemotherapy.  I know this is an incredibly  complicated.  I know that Ms. Gotte is doing a great job.  She is quite tough.  I know that there is a big wedding that she is going to go to in August that involves her son.  I know this is a big deal for her.  At this point, we will plan to get her back in 3 to 4 weeks.   She clearly will need therapy for the bone mets.  We will see about giving her Zometa or possibly Xgeva.       Josph Macho, MD 5/22/20245:09 PM

## 2023-04-16 ENCOUNTER — Telehealth: Payer: Self-pay

## 2023-04-16 ENCOUNTER — Other Ambulatory Visit (HOSPITAL_COMMUNITY): Payer: Self-pay

## 2023-04-16 ENCOUNTER — Other Ambulatory Visit: Payer: Self-pay

## 2023-04-16 DIAGNOSIS — C9001 Multiple myeloma in remission: Secondary | ICD-10-CM

## 2023-04-16 MED ORDER — SELINEXOR (80 MG ONCE WEEKLY) 40 MG PO TBPK: 80.0000 mg | ORAL_TABLET | ORAL | 3 refills | Status: DC

## 2023-04-16 NOTE — Telephone Encounter (Addendum)
Oral Oncology Patient Advocate Encounter  Prior Authorization for [name] has been approved.    PA# [id number]  Effective dates: 04/16/23 through 11/24/23  Patients co-pay is $2,338.70.   Patient has PAF Co-Pay Relief Grant to bring co-pay to $0.00.  [name] is a limited distribution medication and must be filled through [employer].   Ardeen Fillers, CPhT Oncology Pharmacy Patient Advocate  [facility]  [phone] (phone) [phone] (fax) 04/16/2023 9:55 AM

## 2023-04-16 NOTE — Telephone Encounter (Signed)
Oral Oncology Patient Advocate Encounter   Was successful in securing patient a $7,500.00 grant from [employer] (PAF) to provide copayment coverage for Xpovio.  This will keep the out of pocket expense at $0.      The billing information is as follows and has been shared with [facility].   RxBin: [id number] PCN:  [id number] Member ID: [id number] Group ID: [id number] Dates of Eligibility: 10/18/22 through 04/15/24   Ardeen Fillers, CPhT Oncology Pharmacy Patient Advocate  [facility]  [phone] (phone) [phone] (fax) 04/16/2023 10:36 AM

## 2023-04-16 NOTE — Telephone Encounter (Signed)
Oral Oncology Patient Advocate Encounter  New authorization   Received notification that prior authorization for Xpovio is required.   PA submitted on 04/16/23  Key [id number]  Status is pending     Ardeen Fillers, CPhT Oncology Pharmacy Patient Advocate  [facility]  [phone] (phone) [phone] (fax) 04/16/2023 9:21 AM

## 2023-04-16 NOTE — Telephone Encounter (Signed)
Oral Oncology Pharmacist Encounter  Received new prescription for selinexor [name]) for the treatment of relapsed multiple myeloma in conjunction with dexamethasone, planned duration until disease progression or unacceptable toxicity.  Labs from 03/31/2023 assessed, no interventions needed. Message sent to MD to verify dosing.  Prescription dose and frequency assessed for appropriateness.   Current medication list in Epic reviewed, no significant/ relevant DDIs with selinexor identified.  Evaluated chart and no patient barriers to medication adherence noted.   Patient agreement for treatment documented in MD note on 04/15/2023.  Prescription has been e-scribed to the [facility] for benefits analysis and approval.  Oral Oncology Clinic will continue to follow for insurance authorization, copayment issues, initial counseling and start date.  Bethel Born, PharmD Hematology/Oncology Clinical Pharmacist [facility]otherapy [facility] [phone] 04/16/2023 8:54 AM

## 2023-04-17 ENCOUNTER — Telehealth: Payer: Self-pay | Admitting: Pharmacist

## 2023-04-21 ENCOUNTER — Other Ambulatory Visit: Payer: Self-pay

## 2023-04-21 DIAGNOSIS — C9000 Multiple myeloma not having achieved remission: Secondary | ICD-10-CM

## 2023-04-21 MED ORDER — MONTELUKAST SODIUM 10 MG PO TABS
10.0000 mg | ORAL_TABLET | Freq: Every day | ORAL | 3 refills | Status: DC
Start: 2023-04-21 — End: 2023-05-21

## 2023-04-21 NOTE — Telephone Encounter (Signed)
Called [facility] to check status of Xpvoio. Informed by representative that they were able to get in touch with patient, but that patient said they would call them back to schedule delivery. Patient is aware their co-pay with grant will be $0.00 per representative.    Ardeen Fillers, CPhT Oncology Pharmacy Patient Advocate  [facility]  [phone] (phone) [phone] (fax) 04/21/2023 9:32 AM

## 2023-04-22 NOTE — Telephone Encounter (Signed)
Received notification from [facility] that delivery of Xpovio was scheduled for today, 04/22/23.    Ardeen Fillers, CPhT Oncology Pharmacy Patient Advocate  [facility]  [phone] (phone) [phone] (fax) 04/22/2023 8:28 AM

## 2023-04-22 NOTE — Telephone Encounter (Signed)
Oral Chemotherapy Pharmacist Encounter  Patient Education I spoke with patient for overview of new oral chemotherapy medication: Xpovio (selinexor) in combination with dexamethasone for the treatment of multiple myeloma, planned duration until disease progression or unacceptable drug toxicity.   Pt is doing well. Counseled patient on administration, dosing, side effects, monitoring, drug-food interactions, safe handling, storage, and disposal.  Patient will take Xpovio (selinexor) 40 mg tablets, 2 tablets by mouth once a week.  Patient will take compazine 30 minutes before taking Xpovio.  Xpovio start date: 04/22/23 PM  Side effects include but not limited to: nausea/vomiting, decreased blood counts, decreased appetite, fatigue, diarrhea, changes in electrolytes, and dizziness/mental status changes Diarrhea: Patient will obtain Imodium (loperamide) to have on hand if they experience diarrhea. Patient knows to alert the office of 4 or more loose stools above baseline. Nausea/Vomiting: Patient has compazine on hand at home and knows to take this 30-60 minutes prior to taking the Xpovio. Patient may also take this PRN N/V as well.  Reviewed with patient importance of keeping a medication schedule and plan for any missed doses.  After discussion with patient no patient barriers to medication adherence identified.   Marcia [name] voiced understanding and appreciation. All questions answered. Medication handout provided.  Provided patient with Oral Chemotherapy Navigation Clinic phone number. Patient knows to call the office with questions or concerns. Oral Chemotherapy Navigation Clinic will continue to follow.  Lenord Carbo, PharmD, BCPS, [facility] Hematology/Oncology Clinical Pharmacist [facility] Oral Chemotherapy Navigation Clinics [phone] 04/22/2023 9:28 AM

## 2023-05-14 ENCOUNTER — Encounter: Payer: Self-pay | Admitting: Hematology & Oncology

## 2023-05-14 ENCOUNTER — Other Ambulatory Visit: Payer: Self-pay

## 2023-05-15 ENCOUNTER — Other Ambulatory Visit: Payer: Self-pay | Admitting: Hematology & Oncology

## 2023-05-15 MED ORDER — HYDROCODONE-ACETAMINOPHEN 5-325 MG PO TABS: 1.0000 | ORAL_TABLET | Freq: Four times a day (QID) | ORAL | 0 refills | Status: DC | PRN

## 2023-05-21 ENCOUNTER — Encounter: Payer: Self-pay | Admitting: Hematology & Oncology

## 2023-05-21 ENCOUNTER — Inpatient Hospital Stay: Payer: Medicare Other

## 2023-05-21 ENCOUNTER — Other Ambulatory Visit: Payer: Self-pay | Admitting: Hematology & Oncology

## 2023-05-21 ENCOUNTER — Inpatient Hospital Stay: Payer: Medicare Other | Attending: Hematology & Oncology

## 2023-05-21 ENCOUNTER — Inpatient Hospital Stay (HOSPITAL_BASED_OUTPATIENT_CLINIC_OR_DEPARTMENT_OTHER): Payer: Medicare Other | Admitting: Hematology & Oncology

## 2023-05-21 ENCOUNTER — Ambulatory Visit (HOSPITAL_BASED_OUTPATIENT_CLINIC_OR_DEPARTMENT_OTHER)
Admission: RE | Admit: 2023-05-21 | Discharge: 2023-05-21 | Disposition: A | Discharge status: Home / Self Care | Payer: Medicare Other | Source: Ambulatory Visit | Attending: Hematology & Oncology | Admitting: Hematology & Oncology

## 2023-05-21 VITALS — BP 132/67 | HR 82 | Temp 97.7°F | Resp 24

## 2023-05-21 DIAGNOSIS — Z171 Estrogen receptor negative status [ER-]: Secondary | ICD-10-CM | POA: Diagnosis not present

## 2023-05-21 DIAGNOSIS — C9001 Multiple myeloma in remission: Secondary | ICD-10-CM

## 2023-05-21 DIAGNOSIS — Z86718 Personal history of other venous thrombosis and embolism: Secondary | ICD-10-CM | POA: Insufficient documentation

## 2023-05-21 DIAGNOSIS — M25512 Pain in left shoulder: Secondary | ICD-10-CM | POA: Insufficient documentation

## 2023-05-21 DIAGNOSIS — Z86711 Personal history of pulmonary embolism: Secondary | ICD-10-CM | POA: Insufficient documentation

## 2023-05-21 DIAGNOSIS — C7951 Secondary malignant neoplasm of bone: Secondary | ICD-10-CM | POA: Insufficient documentation

## 2023-05-21 DIAGNOSIS — Z7901 Long term (current) use of anticoagulants: Secondary | ICD-10-CM | POA: Diagnosis not present

## 2023-05-21 DIAGNOSIS — C9000 Multiple myeloma not having achieved remission: Secondary | ICD-10-CM | POA: Insufficient documentation

## 2023-05-21 DIAGNOSIS — M549 Dorsalgia, unspecified: Secondary | ICD-10-CM | POA: Insufficient documentation

## 2023-05-21 DIAGNOSIS — Z9484 Stem cells transplant status: Secondary | ICD-10-CM | POA: Diagnosis not present

## 2023-05-21 DIAGNOSIS — C50911 Malignant neoplasm of unspecified site of right female breast: Secondary | ICD-10-CM | POA: Insufficient documentation

## 2023-05-21 LAB — CBC WITH DIFFERENTIAL (CANCER CENTER ONLY)
Abs Immature Granulocytes: 0.01 10*3/uL (ref 0.00–0.07)
Basophils Absolute: 0 10*3/uL (ref 0.0–0.1)
Basophils Relative: 0 %
Eosinophils Absolute: 0 10*3/uL (ref 0.0–0.5)
Eosinophils Relative: 0 %
HCT: 36.6 % (ref 36.0–46.0)
Hemoglobin: 12.7 g/dL (ref 12.0–15.0)
Immature Granulocytes: 0 %
Lymphocytes Relative: 41 %
Lymphs Abs: 1.7 10*3/uL (ref 0.7–4.0)
MCH: 34.8 pg — ABNORMAL HIGH (ref 26.0–34.0)
MCHC: 34.7 g/dL (ref 30.0–36.0)
MCV: 100.3 fL — ABNORMAL HIGH (ref 80.0–100.0)
Monocytes Absolute: 0.3 10*3/uL (ref 0.1–1.0)
Monocytes Relative: 7 %
Neutro Abs: 2.1 10*3/uL (ref 1.7–7.7)
Neutrophils Relative %: 52 %
Platelet Count: 118 10*3/uL — ABNORMAL LOW (ref 150–400)
RBC: 3.65 MIL/uL — ABNORMAL LOW (ref 3.87–5.11)
RDW: 14 % (ref 11.5–15.5)
WBC Count: 4.2 10*3/uL (ref 4.0–10.5)
nRBC: 0 % (ref 0.0–0.2)

## 2023-05-21 LAB — CMP (CANCER CENTER ONLY)
ALT: 10 U/L (ref 0–44)
AST: 18 U/L (ref 15–41)
Albumin: 4.5 g/dL (ref 3.5–5.0)
Alkaline Phosphatase: 56 U/L (ref 38–126)
Anion gap: 12 (ref 5–15)
BUN: 38 mg/dL — ABNORMAL HIGH (ref 8–23)
CO2: 23 mmol/L (ref 22–32)
Calcium: 10.2 mg/dL (ref 8.9–10.3)
Chloride: 101 mmol/L (ref 98–111)
Creatinine: 1.78 mg/dL — ABNORMAL HIGH (ref 0.44–1.00)
GFR, Estimated: 30 mL/min — ABNORMAL LOW (ref >60)
Glucose, Bld: 138 mg/dL — ABNORMAL HIGH (ref 70–99)
Potassium: 3.6 mmol/L (ref 3.5–5.1)
Sodium: 136 mmol/L (ref 135–145)
Total Bilirubin: 0.5 mg/dL (ref 0.3–1.2)
Total Protein: 6.4 g/dL — ABNORMAL LOW (ref 6.5–8.1)

## 2023-05-21 LAB — LACTATE DEHYDROGENASE: LDH: 251 U/L — ABNORMAL HIGH (ref 98–192)

## 2023-05-21 MED ORDER — ESOMEPRAZOLE MAGNESIUM 40 MG PO CPDR: 40.0000 mg | DELAYED_RELEASE_CAPSULE | Freq: Every day | ORAL | 3 refills | Status: DC

## 2023-05-21 MED ORDER — CELECOXIB 100 MG PO CAPS: 100.0000 mg | ORAL_CAPSULE | Freq: Two times a day (BID) | ORAL | 3 refills | Status: DC

## 2023-05-21 MED ORDER — KETOROLAC TROMETHAMINE 15 MG/ML IJ SOLN
30.0000 mg | Freq: Once | INTRAMUSCULAR | Status: AC
  Administered 2023-05-21: 30 mg via INTRAVENOUS
  Filled 2023-05-21: qty 2

## 2023-05-21 MED ORDER — SODIUM CHLORIDE 0.9 % IV SOLN: Freq: Once | INTRAVENOUS | Status: AC

## 2023-05-21 MED ORDER — OXYCODONE HCL 10 MG PO TABS: 10.0000 mg | ORAL_TABLET | Freq: Four times a day (QID) | ORAL | 0 refills | Status: DC | PRN

## 2023-05-21 NOTE — Patient Instructions (Signed)
Acute Back Pain, Adult Acute back pain is sudden and usually short-lived. It is often caused by an injury to the muscles and tissues in the back. The injury may result from: A muscle, tendon, or ligament getting overstretched or torn. Ligaments are tissues that connect bones to each other. Lifting something improperly can cause a back strain. Wear and tear (degeneration) of the spinal disks. Spinal disks are circular tissue that provide cushioning between the bones of the spine (vertebrae). Twisting motions, such as while playing sports or doing yard work. A hit to the back. Arthritis. You may have a physical exam, lab tests, and imaging tests to find the cause of your pain. Acute back pain usually goes away with rest and home care. Follow these instructions at home: Managing pain, stiffness, and swelling Take over-the-counter and prescription medicines only as told by your health care provider. Treatment may include medicines for pain and inflammation that are taken by mouth or applied to the skin, or muscle relaxants. Your health care provider may recommend applying ice during the first 24-48 hours after your pain starts. To do this: Put ice in a plastic bag. Place a towel between your skin and the bag. Leave the ice on for 20 minutes, 2-3 times a day. Remove the ice if your skin turns bright red. This is very important. If you cannot feel pain, heat, or cold, you have a greater risk of damage to the area. If directed, apply heat to the affected area as often as told by your health care provider. Use the heat source that your health care provider recommends, such as a moist heat pack or a heating pad. Place a towel between your skin and the heat source. Leave the heat on for 20-30 minutes. Remove the heat if your skin turns bright red. This is especially important if you are unable to feel pain, heat, or cold. You have a greater risk of getting burned. Activity  Do not stay in bed. Staying in  bed for more than 1-2 days can delay your recovery. Sit up and stand up straight. Avoid leaning forward when you sit or hunching over when you stand. If you work at a desk, sit close to it so you do not need to lean over. Keep your chin tucked in. Keep your neck drawn back, and keep your elbows bent at a 90-degree angle (right angle). Sit high and close to the steering wheel when you drive. Add lower back (lumbar) support to your car seat, if needed. Take short walks on even surfaces as soon as you are able. Try to increase the length of time you walk each day. Do not sit, drive, or stand in one place for more than 30 minutes at a time. Sitting or standing for long periods of time can put stress on your back. Do not drive or use heavy machinery while taking prescription pain medicine. Use proper lifting techniques. When you bend and lift, use positions that put less stress on your back: Bend your knees. Keep the load close to your body. Avoid twisting. Exercise regularly as told by your health care provider. Exercising helps your back heal faster and helps prevent back injuries by keeping muscles strong and flexible. Work with a physical therapist to make a safe exercise program, as recommended by your health care provider. Do any exercises as told by your physical therapist. Lifestyle Maintain a healthy weight. Extra weight puts stress on your back and makes it difficult to have good   posture. Avoid activities or situations that make you feel anxious or stressed. Stress and anxiety increase muscle tension and can make back pain worse. Learn ways to manage anxiety and stress, such as through exercise. General instructions Sleep on a firm mattress in a comfortable position. Try lying on your side with your knees slightly bent. If you lie on your back, put a pillow under your knees. Keep your head and neck in a straight line with your spine (neutral position) when using electronic equipment like  smartphones or pads. To do this: Raise your smartphone or pad to look at it instead of bending your head or neck to look down. Put the smartphone or pad at the level of your face while looking at the screen. Follow your treatment plan as told by your health care provider. This may include: Cognitive or behavioral therapy. Acupuncture or massage therapy. Meditation or yoga. Contact a health care provider if: You have pain that is not relieved with rest or medicine. You have increasing pain going down into your legs or buttocks. Your pain does not improve after 2 weeks. You have pain at night. You lose weight without trying. You have a fever or chills. You develop nausea or vomiting. You develop abdominal pain. Get help right away if: You develop new bowel or bladder control problems. You have unusual weakness or numbness in your arms or legs. You feel faint. These symptoms may represent a serious problem that is an emergency. Do not wait to see if the symptoms will go away. Get medical help right away. Call your local emergency services (911 in the [place] Do not drive yourself to the hospital. Summary Acute back pain is sudden and usually short-lived. Use proper lifting techniques. When you bend and lift, use positions that put less stress on your back. Take over-the-counter and prescription medicines only as told by your health care provider, and apply heat or ice as told. This information is not intended to replace advice given to you by your health care provider. Make sure you discuss any questions you have with your health care provider. Document Revised: 02/01/2021 Document Reviewed: 02/01/2021 Elsevier Patient Education  2024 Elsevier Inc.  

## 2023-05-21 NOTE — Progress Notes (Signed)
Is Hematology and Oncology Follow Up Visit  Marcia [name] [id number] Feb 07, 1951 72 y.o. 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(+), Ki67 of 75% - pathologic CR to chemo --recurrent to bone Pulmonary Embolism/Bilateral LE DVT Recurrent Kappa light chain myeloma - status post stem cell transplant at [facility] in 07/2010    Past Therapy: Neoadjuvant carboplatinum/Taxotere Herceptin/Perjeta - s/p cycle 4 Radiation therapy to the right breast [facility]) Herceptin - adjuvant therapy started 08/18/2018  -- completed on 04/2019   Current Therapy:        Faspro/Velcade/Decadron -- start cycle #6 on 03/19/2022 -- d/c on 10/02/2022 Faspro/Kyprolis/Decadron -- s/p cycle #3  -- start on 10/14/2022 --Kyprolis dropped on 03/03/2023 Eliquis 2.5 mg po BID - started on 04/22/2019 - maintenance Selinexor 80 mg po q week -- start on 04/20/2023 -- hold on 05/21/2023 Xgeva 120 mg sq q 3 months XRT to affected joints   Interim History:  Marcia [name] is here today for an unscheduled visit.  She is having quite a bit of problems with pain.  Her husband called saying that she was miserable.  She had not been able to get out of bed because of pain.  Since we last saw her, she has been at [facility].  She has seen Dr. Jacqulyn Bath.  A bone marrow biopsy was done.  He is planning on doing CAR-T therapy on her.  I am not sure this will be arranged.  She is ready to take this.  The pain has been in the upper back and left shoulder.  She is on Vicodin which is not helping her.  She has had no fever.  There is been no nausea or vomiting.  She has had no weakness in the arms.  She has had no numbness in the arms although in the back of the left upper arm, there is little bit of numbness.  She has had no change in bowel or bladder habits.  I know that she is trying to prepare for the wedding of her son in August.  This is a huge event for  her.  We did go ahead and get an x-ray of her back and of her left shoulder.  This thoracic x-ray did not show any lesions in the thoracic spine.  The x-ray of the shoulder however did show that there is a lytic lesion of 4.2 cm in the left sixth rib.  I will know if this might be causing some of her problems.  We gave her some Toradol in the office.  This did seem to help her.  As such, we might think about some anti-inflammatory at home.  I will try her on some Celebrex (100 mg p.o. twice daily) let see if this may help a little bit.  She is not eating much.  Some of this has to do with the pain.  I will also try her on some oxycodone (10 mg p.o. every 6 hours as needed) to see if this may also help with some of the discomfort.  Think that radiation would be a very good idea for her.  She had radiation to the left clavicle which really resolved the metastasis.  As such, we will try radiotherapy for the lytic lesion in the left sixth rib.  I will call the radiation clinic up and [place].  She has had no bleeding.  She has been taking the Xpovio.  She does this weekly.  For  right now, I told her to stop this.  Overall, I would say performance status is probably ECOG 2.     Medications:  Allergies as of 05/21/2023       Reactions   Heparin Other (See Comments)   HIT   Sulfamethoxazole-trimethoprim Other (See Comments), Rash   ? Renal failure.   Zofran [ondansetron] Shortness Of Breath   "felt like throat was closing"   Clarithromycin Diarrhea, Nausea And Vomiting   Latex Rash   Macrodantin [nitrofurantoin] Rash        Medication List        Accurate as of May 21, 2023  4:38 PM. If you have any questions, ask your nurse or doctor.          STOP taking these medications    dexamethasone 6 MG tablet Commonly known as: DECADRON Stopped by: Josph Macho, MD   HYDROcodone-acetaminophen 5-325 MG tablet Commonly known as: NORCO/VICODIN Stopped by: Josph Macho, MD    traMADol 50 MG tablet Commonly known as: ULTRAM Stopped by: Josph Macho, MD       TAKE these medications    acetaminophen 325 MG tablet Commonly known as: TYLENOL Take 650 mg by mouth daily as needed for moderate pain or headache.   amoxicillin 500 MG tablet Commonly known as: AMOXIL Take 2,000 mg by mouth once. Prior to dental work.   celecoxib 100 MG capsule Commonly known as: CeleBREX Take 1 capsule (100 mg total) by mouth 2 (two) times daily. Started by: Josph Macho, MD   Eliquis 2.5 MG Tabs tablet Generic drug: apixaban TAKE 1 TABLET BY MOUTH TWICE A DAY   esomeprazole 40 MG capsule Commonly known as: NEXIUM Take 1 capsule (40 mg total) by mouth daily at 12 noon. Started by: Josph Macho, MD   famciclovir 250 MG tablet Commonly known as: FAMVIR TAKE 1 TABLET BY MOUTH EVERY DAY   feeding supplement Liqd Take 237 mLs by mouth 3 (three) times daily between meals.   folic acid 1 MG tablet Commonly known as: FOLVITE TAKE 2 TABLETS (2 MG TOTAL) BY MOUTH DAILY.   lidocaine 5 % Commonly known as: LIDODERM APPLY ONE PATCH BY TOPICAL ROUTE EVERY DAY (MAY WEAR UP TO 12HOURS.)   LORazepam 0.5 MG tablet Commonly known as: ATIVAN Take 1 tablet by mouth daily as needed.   losartan 25 MG tablet Commonly known as: COZAAR Take 25 mg by mouth daily.   montelukast 10 MG tablet Commonly known as: SINGULAIR TAKE 1 TABLET BY MOUTH EVERYDAY AT BEDTIME What changed: See the new instructions. Changed by: Josph Macho, MD   OVER THE COUNTER MEDICATION Take 1,200 mg by mouth 2 (two) times daily. C/sour/cherry/celery/grapeseed   Oxycodone HCl 10 MG Tabs Take 1 tablet (10 mg total) by mouth every 6 (six) hours as needed for severe pain. Started by: Josph Macho, MD   prochlorperazine 10 MG tablet Commonly known as: COMPAZINE Take by mouth every 6 (six) hours as needed.   selinexor Therapy Pack (80 mg once weekly) Commonly known as: XPOVIO Take 2  tablets (80 mg total) by mouth once a week.   sodium fluoride 1.1 % Crea dental cream Commonly known as: PREVIDENT 5000 PLUS USE AS DIRECTED ON PACKAGE TO BRUSH TEETH TWICE DAILY SPIT OUT AFTER BRUSHING   DO NOT RINSE   Vitamin D-1000 Max St 25 MCG (1000 UT) tablet Generic drug: Cholecalciferol Take by mouth daily.   VITAMIN E PO daily.  Allergies:  Allergies  Allergen Reactions   Heparin Other (See Comments)    HIT   Sulfamethoxazole-Trimethoprim Other (See Comments) and Rash    ? Renal failure.   Zofran [Ondansetron] Shortness Of Breath    "felt like throat was closing"   Clarithromycin Diarrhea and Nausea And Vomiting   Latex Rash   Macrodantin [Nitrofurantoin] Rash    Past Medical History, Surgical history, Social history, and Family History were reviewed and updated.  Review of Systems: Review of Systems  Constitutional: Negative.   HENT: Negative.    Eyes: Negative.   Respiratory: Negative.    Cardiovascular: Negative.   Gastrointestinal: Negative.   Genitourinary: Negative.   Musculoskeletal: Negative.   Skin: Negative.   Neurological: Negative.   Endo/Heme/Allergies: Negative.   Psychiatric/Behavioral: Negative.        Physical Exam:  oral temperature is 97.7 F (36.5 C). Her blood pressure is 132/67 and her pulse is 82. Her respiration is 24 (abnormal) and oxygen saturation is 97%.   Wt Readings from Last 3 Encounters:  04/15/23 221 lb 1.9 oz (100.3 kg)  03/31/23 220 lb (99.8 kg)  03/17/23 219 lb (99.3 kg)    Physical Exam Vitals reviewed.  HENT:     Head: Normocephalic and atraumatic.  Eyes:     Pupils: Pupils are equal, round, and reactive to light.  Cardiovascular:     Rate and Rhythm: Normal rate and regular rhythm.     Heart sounds: Normal heart sounds.  Pulmonary:     Effort: Pulmonary effort is normal.     Breath sounds: Normal breath sounds.  Abdominal:     General: Bowel sounds are normal.     Palpations: Abdomen is  soft.  Musculoskeletal:        General: No tenderness or deformity. Normal range of motion.     Cervical back: Normal range of motion.     Comments: On the very lateral portion of the left clavicle, there is a nodule.  It is somewhat firm.  It is a little bit tender.  There is no erythema.  Lymphadenopathy:     Cervical: No cervical adenopathy.  Skin:    General: Skin is warm and dry.     Findings: No erythema or rash.  Neurological:     Mental Status: She is alert and oriented to person, place, and time.  Psychiatric:        Behavior: Behavior normal.        Thought Content: Thought content normal.        Judgment: Judgment normal.     Lab Results  Component Value Date   WBC 4.2 05/21/2023   HGB 12.7 05/21/2023   HCT 36.6 05/21/2023   MCV 100.3 (H) 05/21/2023   PLT 118 (L) 05/21/2023   Lab Results  Component Value Date   FERRITIN 820 (H) 05/17/2018   IRON 272 (H) 05/17/2018   TIBC 295 05/17/2018   UIBC 23 05/17/2018   IRONPCTSAT 92 (H) 05/17/2018   Lab Results  Component Value Date   RETICCTPCT 1.3 03/27/2011   RBC 3.65 (L) 05/21/2023   RETICCTABS 56.4 03/27/2011   Lab Results  Component Value Date   KPAFRELGTCHN 224.2 (H) 03/17/2023   LAMBDASER 6.0 03/17/2023   KAPLAMBRATIO 37.37 (H) 03/17/2023   Lab Results  Component Value Date   IGGSERUM 218 (L) 03/17/2023   IGA 10 (L) 03/17/2023   IGMSERUM 7 (L) 03/17/2023   Lab Results  Component Value Date   TOTALPROTELP 5.8 (  L) 03/17/2023   ALBUMINELP 3.4 03/17/2023   A1GS 0.2 03/17/2023   A2GS 0.8 03/17/2023   BETS 1.2 03/17/2023   BETA2SER 0.4 07/02/2015   GAMS 0.2 (L) 03/17/2023   MSPIKE Not Observed 03/17/2023   SPEI Comment 10/02/2022     Chemistry      Component Value Date/Time   NA 136 05/21/2023 1445   NA 141 07/06/2017 0816   NA 141 01/05/2017 0956   K 3.6 05/21/2023 1445   K 3.6 07/06/2017 0816   K 4.3 01/05/2017 0956   CL 101 05/21/2023 1445   CL 101 07/06/2017 0816   CO2 23 05/21/2023  1445   CO2 28 07/06/2017 0816   CO2 23 01/05/2017 0956   BUN 38 (H) 05/21/2023 1445   BUN 28 (H) 07/06/2017 0816   BUN 22.4 01/05/2017 0956   CREATININE 1.78 (H) 05/21/2023 1445   CREATININE 1.6 (H) 07/06/2017 0816   CREATININE 1.4 (H) 01/05/2017 0956      Component Value Date/Time   CALCIUM 10.2 05/21/2023 1445   CALCIUM 9.1 07/06/2017 0816   CALCIUM 9.3 01/05/2017 0956   ALKPHOS 56 05/21/2023 1445   ALKPHOS 58 07/06/2017 0816   ALKPHOS 52 01/05/2017 0956   AST 18 05/21/2023 1445   AST 14 01/05/2017 0956   ALT 10 05/21/2023 1445   ALT 58 (H) 07/06/2017 0816   ALT 13 01/05/2017 0956   BILITOT 0.5 05/21/2023 1445   BILITOT 0.56 01/05/2017 0956       Impression and Plan: Ms. Waskey is a very pleasant 72 yo caucasian female with kappa light chain myeloma.  She had recurrent disease.  She finally is progressing despite all of our therapy.  I think that I will CAR-T be a very good way of trying to help this.  Again, she is seen Dr. Jacqulyn Bath at [facility].  I am unsure when he has the CAR-T therapy arranged for her.  Right now, we really need to help her pain issues.  I will go ahead and see about radiation therapy to the left sixth rib.  I think this would be very reasonable.  I know worked incredibly well for the left clavicle.  Maybe, the Celebrex/oxycodone will help her.  I really just want to make sure that we have help her quality of life.  Again, her son's wedding is going to be incredibly important for her.  She really wants to be healthy for this so she can be an active participant in all the wedding festivities.  We are we will have to follow her up ourselves.  I will have to get her back probably in a couple weeks to see how she is feeling.    I need to make sure that we get her on Xgeva or Zometa to try to help with her bones.   Josph Macho, MD 6/27/20244:38 PM

## 2023-05-22 ENCOUNTER — Telehealth: Payer: Self-pay

## 2023-05-22 DIAGNOSIS — C50911 Malignant neoplasm of unspecified site of right female breast: Secondary | ICD-10-CM

## 2023-05-22 DIAGNOSIS — C9002 Multiple myeloma in relapse: Secondary | ICD-10-CM

## 2023-05-22 DIAGNOSIS — C9001 Multiple myeloma in remission: Secondary | ICD-10-CM

## 2023-05-22 LAB — CANCER ANTIGEN 27.29: CA 27.29: 216.7 U/mL — ABNORMAL HIGH (ref 0.0–38.6)

## 2023-05-22 LAB — KAPPA/LAMBDA LIGHT CHAINS
Kappa free light chain: 1044.6 mg/L — ABNORMAL HIGH (ref 3.3–19.4)
Kappa, lambda light chain ratio: 454.17 — ABNORMAL HIGH (ref 0.26–1.65)
Lambda free light chains: 2.3 mg/L — ABNORMAL LOW (ref 5.7–26.3)

## 2023-05-22 NOTE — Telephone Encounter (Signed)
New referral sent to [facility] Radiation Oncology per request of Dr Langston Masker due to new diagnosis and location.

## 2023-05-23 LAB — IGG, IGA, IGM
IgA: 7 mg/dL — ABNORMAL LOW (ref 64–422)
IgG (Immunoglobin G), Serum: 192 mg/dL — ABNORMAL LOW (ref 586–1602)
IgM (Immunoglobulin M), Srm: 7 mg/dL — ABNORMAL LOW (ref 26–217)

## 2023-05-25 LAB — PROTEIN ELECTROPHORESIS, SERUM, WITH REFLEX
A/G Ratio: 1.7 (ref 0.7–1.7)
Albumin ELP: 3.7 g/dL (ref 2.9–4.4)
Alpha-1-Globulin: 0.2 g/dL (ref 0.0–0.4)
Alpha-2-Globulin: 0.8 g/dL (ref 0.4–1.0)
Beta Globulin: 1 g/dL (ref 0.7–1.3)
Gamma Globulin: 0.2 g/dL — ABNORMAL LOW (ref 0.4–1.8)
Globulin, Total: 2.2 g/dL (ref 2.2–3.9)
Total Protein ELP: 5.9 g/dL — ABNORMAL LOW (ref 6.0–8.5)

## 2023-05-31 ENCOUNTER — Encounter: Payer: Self-pay | Admitting: Hematology & Oncology

## 2023-05-31 DIAGNOSIS — C50911 Malignant neoplasm of unspecified site of right female breast: Secondary | ICD-10-CM

## 2023-06-01 ENCOUNTER — Encounter: Payer: Self-pay | Admitting: Hematology & Oncology

## 2023-06-01 MED ORDER — OXYCODONE HCL ER 20 MG PO T12A
20.00 mg | EXTENDED_RELEASE_TABLET | Freq: Two times a day (BID) | ORAL | 0 refills | Status: DC
Start: 2023-06-01 — End: 2023-06-02

## 2023-06-02 ENCOUNTER — Other Ambulatory Visit: Payer: Self-pay | Admitting: *Deleted

## 2023-06-02 ENCOUNTER — Encounter (HOSPITAL_COMMUNITY): Payer: Self-pay

## 2023-06-02 ENCOUNTER — Telehealth: Payer: Self-pay | Admitting: *Deleted

## 2023-06-02 MED ORDER — OXYCODONE HCL 10 MG PO TABS: 10.0000 mg | ORAL_TABLET | ORAL | 0 refills | Status: DC | PRN

## 2023-06-02 MED ORDER — XTAMPZA ER 18 MG PO C12A: 18.0000 mg | EXTENDED_RELEASE_CAPSULE | Freq: Two times a day (BID) | ORAL | 0 refills | Status: DC

## 2023-06-02 NOTE — Telephone Encounter (Signed)
Per staff message Ronne Binning - Called patient and canceled the appointments for 7/11. She said that she would call me back tomorrow after she got her thoughts together to come up with an appointment date.

## 2023-06-02 NOTE — Telephone Encounter (Signed)
Call received from patient stating that her pharmacy has instructed her that Oxycontin is not covered by her insurance and also that Oxycodone 10 mg every six hours is not covering her back pain.  Dr. Myna Hidalgo notified.  Order received to increase Oxycodone 10 mg to every four hours as needed for pain and to change Oxycontin to Xtampza.  Call placed back to patient to inform her of medication change and pt states that she is currently on the way to the ER d/t her increased pain. Dr. Myna Hidalgo notified.

## 2023-06-03 ENCOUNTER — Inpatient Hospital Stay: Payer: Medicare Other | Admitting: Hematology & Oncology

## 2023-06-03 ENCOUNTER — Telehealth: Payer: Self-pay | Admitting: Dietician

## 2023-06-03 ENCOUNTER — Inpatient Hospital Stay: Payer: Medicare Other

## 2023-06-03 ENCOUNTER — Encounter: Payer: Self-pay | Admitting: Hematology & Oncology

## 2023-06-03 ENCOUNTER — Telehealth: Payer: Self-pay | Admitting: *Deleted

## 2023-06-03 ENCOUNTER — Encounter: Payer: Medicare Other | Admitting: Dietician

## 2023-06-03 ENCOUNTER — Inpatient Hospital Stay: Payer: Medicare Other | Attending: Hematology & Oncology

## 2023-06-03 VITALS — BP 173/92 | HR 103 | Temp 98.0°F | Resp 22

## 2023-06-03 DIAGNOSIS — Z86718 Personal history of other venous thrombosis and embolism: Secondary | ICD-10-CM | POA: Insufficient documentation

## 2023-06-03 DIAGNOSIS — C9000 Multiple myeloma not having achieved remission: Secondary | ICD-10-CM | POA: Diagnosis present

## 2023-06-03 DIAGNOSIS — Z86711 Personal history of pulmonary embolism: Secondary | ICD-10-CM | POA: Diagnosis not present

## 2023-06-03 DIAGNOSIS — Z853 Personal history of malignant neoplasm of breast: Secondary | ICD-10-CM | POA: Diagnosis not present

## 2023-06-03 DIAGNOSIS — C9001 Multiple myeloma in remission: Secondary | ICD-10-CM | POA: Diagnosis not present

## 2023-06-03 DIAGNOSIS — N171 Acute kidney failure with acute cortical necrosis: Secondary | ICD-10-CM

## 2023-06-03 DIAGNOSIS — Z9484 Stem cells transplant status: Secondary | ICD-10-CM | POA: Insufficient documentation

## 2023-06-03 DIAGNOSIS — C50911 Malignant neoplasm of unspecified site of right female breast: Secondary | ICD-10-CM

## 2023-06-03 DIAGNOSIS — C9002 Multiple myeloma in relapse: Secondary | ICD-10-CM

## 2023-06-03 DIAGNOSIS — T50905A Adverse effect of unspecified drugs, medicaments and biological substances, initial encounter: Secondary | ICD-10-CM

## 2023-06-03 MED ORDER — SODIUM CHLORIDE 0.9 % IV SOLN
20.0000 mg | Freq: Once | INTRAVENOUS | Status: AC
  Administered 2023-06-03: 20 mg via INTRAVENOUS
  Filled 2023-06-03: qty 20

## 2023-06-03 MED ORDER — KETOROLAC TROMETHAMINE 15 MG/ML IJ SOLN
30.0000 mg | Freq: Once | INTRAMUSCULAR | Status: AC
  Administered 2023-06-03: 30 mg via INTRAVENOUS
  Filled 2023-06-03: qty 2

## 2023-06-03 MED ORDER — DRONABINOL 2.5 MG PO CAPS: 2.5000 mg | ORAL_CAPSULE | Freq: Two times a day (BID) | ORAL | 0 refills | Status: DC

## 2023-06-03 MED ORDER — FLUCONAZOLE 100 MG PO TABS: 100.0000 mg | ORAL_TABLET | Freq: Every day | ORAL | 3 refills | Status: DC

## 2023-06-03 MED ORDER — SODIUM CHLORIDE 0.9 % IV SOLN: Freq: Once | INTRAVENOUS | Status: AC

## 2023-06-03 MED ORDER — PREDNISONE 20 MG PO TABS: 20.0000 mg | ORAL_TABLET | Freq: Every day | ORAL | 3 refills | Status: DC

## 2023-06-03 MED ORDER — ZOLEDRONIC ACID 4 MG/100ML IV SOLN
4.0000 mg | Freq: Once | INTRAVENOUS | Status: AC
  Administered 2023-06-03: 4 mg via INTRAVENOUS
  Filled 2023-06-03: qty 100

## 2023-06-03 NOTE — Telephone Encounter (Signed)
Patient screened on MST. First attempt to reach, tried mobile as notes states she may be on way to office. Provided my cell# on voice mail to return call to set up a nutrition consult or talk later today.  Gennaro Africa, RDN, LDN Registered Dietitian, [facility] Part Time Remote (Usual office hours: Tuesday-Thursday) Cell: [phone]

## 2023-06-03 NOTE — Telephone Encounter (Signed)
Call received from patient stating that she went to the ER in [place] yesterday afternoon for uncontrolled pain, but remains in uncontrolled pain to back and ribs, pain is a 10/10 this morning.  Dr. Myna Hidalgo notified.  Pt instructed to come to this office now per order of Dr. Myna Hidalgo.  Pt states that she will come to office now.  Message sent to scheduling.

## 2023-06-03 NOTE — Patient Instructions (Signed)

## 2023-06-04 ENCOUNTER — Other Ambulatory Visit: Payer: Self-pay | Admitting: Hematology & Oncology

## 2023-06-04 ENCOUNTER — Inpatient Hospital Stay: Payer: Medicare Other

## 2023-06-04 ENCOUNTER — Ambulatory Visit: Payer: Medicare Other

## 2023-06-04 ENCOUNTER — Encounter: Payer: Self-pay | Admitting: *Deleted

## 2023-06-04 ENCOUNTER — Inpatient Hospital Stay: Payer: Medicare Other | Admitting: Hematology & Oncology

## 2023-06-04 ENCOUNTER — Encounter: Payer: Self-pay | Admitting: Hematology & Oncology

## 2023-06-04 NOTE — Progress Notes (Signed)
Approval received from [facility] for Dronabinol 2.5 mg caps from 06/04/23-11/24/23.

## 2023-06-04 NOTE — Progress Notes (Signed)
Is Hematology and Oncology Follow Up Visit  Marcia [name] [id number] 1951/08/01 72 y.o. 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(+), Ki67 of 75% - pathologic CR to chemo --recurrent to bone Pulmonary Embolism/Bilateral LE DVT Recurrent Kappa light chain myeloma - status post stem cell transplant at [facility] in 07/2010    Past Therapy: Neoadjuvant carboplatinum/Taxotere Herceptin/Perjeta - s/p cycle 4 Radiation therapy to the right breast [facility] Care) Herceptin - adjuvant therapy started 08/18/2018  -- completed on 04/2019   Current Therapy:        Faspro/Velcade/Decadron -- start cycle #6 on 03/19/2022 -- d/c on 10/02/2022 Faspro/Kyprolis/Decadron -- s/p cycle #3  -- start on 10/14/2022 --Kyprolis dropped on 03/03/2023 Eliquis 2.5 mg po BID - started on 04/22/2019 - maintenance Selinexor 80 mg po q week -- start on 04/20/2023 -- hold on 05/21/2023 Xgeva 120 mg sq q 3 months XRT to affected joints   Interim History:  Marcia [name] is here today for an unscheduled visit.  She is having quite a bit of problems with pain.  Her husband called saying that she was miserable.  She had not been able to get out of bed because of pain.  She actually went to the local emergency room.  This was I think a couple days ago.  She did have a CT scan of the neck and thoracic spine.  This did show some lytic lesions.  Is hard to say at this is causing her pain.  She is getting radiation therapy.  I know that the radiation therapist is doing a good job trying to help her with the pain.  She has been having a tough time getting her pain medication.  I am unsure as to what the problem is.  She is on oxycodone.  I also want her to be on long-acting morphine.  I think we are far able to give her the long-acting morphine.  She actually will be admitted at [facility] next week for her CAR-T therapy.  I would like to hope that when she  takes this, her pain will improve.  I just feel so bad that she is being miserable.  I know that she is trying hard.  She has had no problems with bowels or bladder.  She has had no problems with cough.  She has had no nausea or vomiting.  There is been no bleeding.  Overall, I would say performance status right now is probably ECOG 2.    Medications:  Allergies as of 06/03/2023       Reactions   Heparin Other (See Comments)   HIT   Sulfamethoxazole-trimethoprim Other (See Comments), Rash   ? Renal failure.   Zofran [ondansetron] Shortness Of Breath   "felt like throat was closing"   Clarithromycin Diarrhea, Nausea And Vomiting   Latex Rash   Macrodantin [nitrofurantoin] Rash        Medication List        Accurate as of June 03, 2023 11:59 PM. If you have any questions, ask your nurse or doctor.          STOP taking these medications    oxyCODONE-acetaminophen 10-325 MG tablet Commonly known as: PERCOCET Stopped by: Josph Macho       TAKE these medications    acetaminophen 325 MG tablet Commonly known as: TYLENOL Take 650 mg by mouth daily as needed for moderate pain or headache.   amoxicillin 500 MG tablet  Commonly known as: AMOXIL Take 2,000 mg by mouth once. Prior to dental work.   celecoxib 100 MG capsule Commonly known as: CeleBREX Take 1 capsule (100 mg total) by mouth 2 (two) times daily.   dronabinol 2.5 MG capsule Commonly known as: MARINOL Take 1 capsule (2.5 mg total) by mouth 2 (two) times daily before a meal. Started by: Josph Macho   Eliquis 2.5 MG Tabs tablet Generic drug: apixaban TAKE 1 TABLET BY MOUTH TWICE A DAY   esomeprazole 40 MG capsule Commonly known as: NEXIUM Take 1 capsule (40 mg total) by mouth daily at 12 noon.   famciclovir 250 MG tablet Commonly known as: FAMVIR TAKE 1 TABLET BY MOUTH EVERY DAY   feeding supplement Liqd Take 237 mLs by mouth 3 (three) times daily between meals.   fluconazole 100 MG  tablet Commonly known as: DIFLUCAN Take 1 tablet (100 mg total) by mouth daily. Started by: Josph Macho   folic acid 1 MG tablet Commonly known as: FOLVITE TAKE 2 TABLETS (2 MG TOTAL) BY MOUTH DAILY.   lidocaine 5 % Commonly known as: LIDODERM APPLY ONE PATCH BY TOPICAL ROUTE EVERY DAY (MAY WEAR UP TO 12HOURS.)   LORazepam 0.5 MG tablet Commonly known as: ATIVAN Take 1 tablet by mouth daily as needed.   losartan 25 MG tablet Commonly known as: COZAAR Take 25 mg by mouth daily.   montelukast 10 MG tablet Commonly known as: SINGULAIR TAKE 1 TABLET BY MOUTH EVERYDAY AT BEDTIME   OVER THE COUNTER MEDICATION Take 1,200 mg by mouth 2 (two) times daily. C/sour/cherry/celery/grapeseed   Oxycodone HCl 10 MG Tabs Take 1 tablet (10 mg total) by mouth every 4 (four) hours as needed.   predniSONE 20 MG tablet Commonly known as: DELTASONE Take 1 tablet (20 mg total) by mouth daily with breakfast. Started by: Josph Macho   prochlorperazine 10 MG tablet Commonly known as: COMPAZINE Take by mouth every 6 (six) hours as needed.   selinexor Therapy Pack (80 mg once weekly) Commonly known as: XPOVIO Take 2 tablets (80 mg total) by mouth once a week.   sodium fluoride 1.1 % Crea dental cream Commonly known as: PREVIDENT 5000 PLUS USE AS DIRECTED ON PACKAGE TO BRUSH TEETH TWICE DAILY SPIT OUT AFTER BRUSHING   DO NOT RINSE   Vitamin D-1000 Max St 25 MCG (1000 UT) tablet Generic drug: Cholecalciferol Take by mouth daily.   VITAMIN E PO daily.   Xtampza ER 18 MG C12a Generic drug: oxyCODONE ER Take 18 mg by mouth in the morning and at bedtime.        Allergies:  Allergies  Allergen Reactions   Heparin Other (See Comments)    HIT   Sulfamethoxazole-Trimethoprim Other (See Comments) and Rash    ? Renal failure.   Zofran [Ondansetron] Shortness Of Breath    "felt like throat was closing"   Clarithromycin Diarrhea and Nausea And Vomiting   Latex Rash   Macrodantin  [Nitrofurantoin] Rash    Past Medical History, Surgical history, Social history, and Family History were reviewed and updated.  Review of Systems: Review of Systems  Constitutional: Negative.   HENT: Negative.    Eyes: Negative.   Respiratory: Negative.    Cardiovascular: Negative.   Gastrointestinal: Negative.   Genitourinary: Negative.   Musculoskeletal: Negative.   Skin: Negative.   Neurological: Negative.   Endo/Heme/Allergies: Negative.   Psychiatric/Behavioral: Negative.        Physical Exam:  vitals were not  taken for this visit.   Wt Readings from Last 3 Encounters:  04/15/23 221 lb 1.9 oz (100.3 kg)  03/31/23 220 lb (99.8 kg)  03/17/23 219 lb (99.3 kg)    Physical Exam Vitals reviewed.  HENT:     Head: Normocephalic and atraumatic.  Eyes:     Pupils: Pupils are equal, round, and reactive to light.  Cardiovascular:     Rate and Rhythm: Normal rate and regular rhythm.     Heart sounds: Normal heart sounds.  Pulmonary:     Effort: Pulmonary effort is normal.     Breath sounds: Normal breath sounds.  Abdominal:     General: Bowel sounds are normal.     Palpations: Abdomen is soft.  Musculoskeletal:        General: No tenderness or deformity. Normal range of motion.     Cervical back: Normal range of motion.     Comments: On the very lateral portion of the left clavicle, there is a nodule.  It is somewhat firm.  It is a little bit tender.  There is no erythema.  Lymphadenopathy:     Cervical: No cervical adenopathy.  Skin:    General: Skin is warm and dry.     Findings: No erythema or rash.  Neurological:     Mental Status: She is alert and oriented to person, place, and time.  Psychiatric:        Behavior: Behavior normal.        Thought Content: Thought content normal.        Judgment: Judgment normal.     Lab Results  Component Value Date   WBC 4.2 05/21/2023   HGB 12.7 05/21/2023   HCT 36.6 05/21/2023   MCV 100.3 (H) 05/21/2023   PLT 118  (L) 05/21/2023   Lab Results  Component Value Date   FERRITIN 820 (H) 05/17/2018   IRON 272 (H) 05/17/2018   TIBC 295 05/17/2018   UIBC 23 05/17/2018   IRONPCTSAT 92 (H) 05/17/2018   Lab Results  Component Value Date   RETICCTPCT 1.3 03/27/2011   RBC 3.65 (L) 05/21/2023   RETICCTABS 56.4 03/27/2011   Lab Results  Component Value Date   KPAFRELGTCHN 1,044.6 (H) 05/21/2023   LAMBDASER 2.3 (L) 05/21/2023   KAPLAMBRATIO 454.17 (H) 05/21/2023   Lab Results  Component Value Date   IGGSERUM 192 (L) 05/21/2023   IGA 7 (L) 05/21/2023   IGMSERUM 7 (L) 05/21/2023   Lab Results  Component Value Date   TOTALPROTELP 5.9 (L) 05/21/2023   ALBUMINELP 3.7 05/21/2023   A1GS 0.2 05/21/2023   A2GS 0.8 05/21/2023   BETS 1.0 05/21/2023   BETA2SER 0.4 07/02/2015   GAMS 0.2 (L) 05/21/2023   MSPIKE Not Observed 05/21/2023   SPEI Comment 10/02/2022     Chemistry      Component Value Date/Time   NA 136 05/21/2023 1445   NA 141 07/06/2017 0816   NA 141 01/05/2017 0956   K 3.6 05/21/2023 1445   K 3.6 07/06/2017 0816   K 4.3 01/05/2017 0956   CL 101 05/21/2023 1445   CL 101 07/06/2017 0816   CO2 23 05/21/2023 1445   CO2 28 07/06/2017 0816   CO2 23 01/05/2017 0956   BUN 38 (H) 05/21/2023 1445   BUN 28 (H) 07/06/2017 0816   BUN 22.4 01/05/2017 0956   CREATININE 1.78 (H) 05/21/2023 1445   CREATININE 1.6 (H) 07/06/2017 0816   CREATININE 1.4 (H) 01/05/2017 1610  Component Value Date/Time   CALCIUM 10.2 05/21/2023 1445   CALCIUM 9.1 07/06/2017 0816   CALCIUM 9.3 01/05/2017 0956   ALKPHOS 56 05/21/2023 1445   ALKPHOS 58 07/06/2017 0816   ALKPHOS 52 01/05/2017 0956   AST 18 05/21/2023 1445   AST 14 01/05/2017 0956   ALT 10 05/21/2023 1445   ALT 58 (H) 07/06/2017 0816   ALT 13 01/05/2017 0956   BILITOT 0.5 05/21/2023 1445   BILITOT 0.56 01/05/2017 0956       Impression and Plan: Ms. Kozma is a very pleasant 72 yo caucasian female with kappa light chain myeloma.  She had  recurrent disease.  She finally is progressing despite all of our therapy.  We given her IV fluid.  She usually does well with steroid and Toradol.  I will send her prescription for prednisone for her at home.  With this, she will also have Diflucan.  Maybe, the prednisone will help with some of the inflammatory component of her pain.  She will continue her radiation therapy.  She did feel better after she got done with her IV infusions.  Hopefully, this will help her for a while.  I know the big issue for her is Aundra Millet to her son's wedding in August.  This is critical.  I forgot to mention that she we did give her Zometa.  Hopefully this will also make a difference.  Hopefully, when she gets to [facility], they will be able to help her out with some of the pain.  Maybe while she is there, she will have pain specialist help try to manage some of the discomfort.  We will plan to get her back to see [place] when [facility] let us know she is ready to be seen.   Josph Macho, MD 7/11/20244:51 PM

## 2023-07-26 DEATH — deceased

## 2023-08-26 ENCOUNTER — Encounter: Payer: Self-pay | Admitting: Hematology & Oncology

## 2023-08-26 NOTE — Progress Notes (Signed)
Is Hematology and Oncology Follow Up Visit  Marcia [name] [id number] Jun 02, 1951 72 y.o. 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(+), Ki67 of 75% - pathologic CR to chemo --recurrent to bone Pulmonary Embolism/Bilateral LE DVT Recurrent Kappa light chain myeloma - status post stem cell transplant at [facility] in 07/2010    Past Therapy: Neoadjuvant carboplatinum/Taxotere Herceptin/Perjeta - s/p cycle 4 Radiation therapy to the right breast [facility] Care) Herceptin - adjuvant therapy started 08/18/2018  -- completed on 04/2019   Current Therapy:        Faspro/Velcade/Decadron -- start cycle #6 on 03/19/2022 -- d/c on 10/02/2022 Faspro/Kyprolis/Decadron -- s/p cycle #3  -- start on 10/14/2022 --Kyprolis dropped on 03/03/2023 Eliquis 2.5 mg po BID - started on 04/22/2019 - maintenance   Interim History:  Marcia [name] is here today for early follow-up.  She now has a lump on the lateral aspect of her left clavicle.  We we will get an x-ray on her today.  Unfortunate I do not have the results back as of yet.  I suspect that this is really the breast cancer.  We did liquid biopsies on her to see what her prognostic markers are.  We cannot get prognostic markers on the biopsy that was taken from the bone.  Hopefully, we will get this back soon.  I think Radiation Therapy would not be a bad idea for her.  I spoke to her Radiation Oncologist up in [place] who will be when happy to get her in.  She also wants to stop the Kyprolis.  They went to use something else for the myeloma of the Faspro.  I may think about Cytoxan.  This may not be a bad idea.  She is responding to treatment for the myeloma.  Her last Kappa light chain was down to 11.7 mg/dL.  She had a good weekend.  Grandson came to visit.  She has had no fever.  Her appetite has been pretty good.  She has had no problem with bowels or bladder.  She has had no  leg swelling.  There is been no rashes.  Overall, I would say that her performance status is probably ECOG 1.   Medications:  Allergies as of 03/31/2023       Reactions   Heparin Other (See Comments)   HIT   Sulfamethoxazole-trimethoprim Other (See Comments), Rash   ? Renal failure.   Zofran [ondansetron] Shortness Of Breath   "felt like throat was closing"   Clarithromycin Diarrhea, Nausea And Vomiting   Latex Rash   Macrodantin [nitrofurantoin] Rash        Medication List        Accurate as of Mar 31, 2023 11:59 PM. If you have any questions, ask your nurse or doctor.          acetaminophen 325 MG tablet Commonly known as: TYLENOL Take 650 mg by mouth daily as needed for moderate pain or headache.   amoxicillin 500 MG tablet Commonly known as: AMOXIL Take 2,000 mg by mouth once. Prior to dental work.   dexamethasone 6 MG tablet Commonly known as: DECADRON Take 2 tablets (12 mg total) by mouth daily. Take 30 minutes prior to treatment.   Eliquis 2.5 MG Tabs tablet Generic drug: apixaban TAKE 1 TABLET BY MOUTH TWICE A DAY   famciclovir 250 MG tablet Commonly known as: FAMVIR TAKE 1 TABLET BY MOUTH EVERY DAY   feeding supplement Liqd Take  237 mLs by mouth 3 (three) times daily between meals.   folic acid 1 MG tablet Commonly known as: FOLVITE TAKE 2 TABLETS (2 MG TOTAL) BY MOUTH DAILY.   LORazepam 0.5 MG tablet Commonly known as: ATIVAN Take 1 tablet by mouth daily as needed.   losartan 25 MG tablet Commonly known as: COZAAR Take 25 mg by mouth daily.   montelukast 10 MG tablet Commonly known as: Singulair Take 1 tablet (10 mg total) by mouth at bedtime.   prochlorperazine 10 MG tablet Commonly known as: COMPAZINE Take 1 tablet (10 mg total) by mouth every 6 (six) hours as needed for nausea or vomiting.   traMADol 50 MG tablet Commonly known as: ULTRAM Take 1 tablet by mouth every 12 (twelve) hours as needed.   VITAMIN E PO daily.         Allergies:  Allergies  Allergen Reactions   Heparin Other (See Comments)    HIT   Sulfamethoxazole-Trimethoprim Other (See Comments) and Rash    ? Renal failure.   Zofran [Ondansetron] Shortness Of Breath    "felt like throat was closing"   Clarithromycin Diarrhea and Nausea And Vomiting   Latex Rash   Macrodantin [Nitrofurantoin] Rash    Past Medical History, Surgical history, Social history, and Family History were reviewed and updated.  Review of Systems: Review of Systems  Constitutional: Negative.   HENT: Negative.    Eyes: Negative.   Respiratory: Negative.    Cardiovascular: Negative.   Gastrointestinal: Negative.   Genitourinary: Negative.   Musculoskeletal: Negative.   Skin: Negative.   Neurological: Negative.   Endo/Heme/Allergies: Negative.   Psychiatric/Behavioral: Negative.        Physical Exam:  weight is 220 lb (99.8 kg).   Wt Readings from Last 3 Encounters:  04/15/23 221 lb 1.9 oz (100.3 kg)  03/31/23 220 lb (99.8 kg)  03/17/23 219 lb (99.3 kg)    Physical Exam Vitals reviewed.  HENT:     Head: Normocephalic and atraumatic.  Eyes:     Pupils: Pupils are equal, round, and reactive to light.  Cardiovascular:     Rate and Rhythm: Normal rate and regular rhythm.     Heart sounds: Normal heart sounds.  Pulmonary:     Effort: Pulmonary effort is normal.     Breath sounds: Normal breath sounds.  Abdominal:     General: Bowel sounds are normal.     Palpations: Abdomen is soft.  Musculoskeletal:        General: No tenderness or deformity. Normal range of motion.     Cervical back: Normal range of motion.     Comments: On the very lateral portion of the left clavicle, there is a nodule.  It is somewhat firm.  It is a little bit tender.  There is no erythema.  Lymphadenopathy:     Cervical: No cervical adenopathy.  Skin:    General: Skin is warm and dry.     Findings: No erythema or rash.  Neurological:     Mental Status: She is alert and  oriented to person, place, and time.  Psychiatric:        Behavior: Behavior normal.        Thought Content: Thought content normal.        Judgment: Judgment normal.     Lab Results  Component Value Date   WBC 4.2 05/21/2023   HGB 12.7 05/21/2023   HCT 36.6 05/21/2023   MCV 100.3 (H) 05/21/2023   PLT  118 (L) 05/21/2023   Lab Results  Component Value Date   FERRITIN 820 (H) 05/17/2018   IRON 272 (H) 05/17/2018   TIBC 295 05/17/2018   UIBC 23 05/17/2018   IRONPCTSAT 92 (H) 05/17/2018   Lab Results  Component Value Date   RETICCTPCT 1.3 03/27/2011   RBC 3.65 (L) 05/21/2023   RETICCTABS 56.4 03/27/2011   Lab Results  Component Value Date   KPAFRELGTCHN 1,044.6 (H) 05/21/2023   LAMBDASER 2.3 (L) 05/21/2023   KAPLAMBRATIO 454.17 (H) 05/21/2023   Lab Results  Component Value Date   IGGSERUM 192 (L) 05/21/2023   IGA 7 (L) 05/21/2023   IGMSERUM 7 (L) 05/21/2023   Lab Results  Component Value Date   TOTALPROTELP 5.9 (L) 05/21/2023   ALBUMINELP 3.7 05/21/2023   A1GS 0.2 05/21/2023   A2GS 0.8 05/21/2023   BETS 1.0 05/21/2023   BETA2SER 0.4 07/02/2015   GAMS 0.2 (L) 05/21/2023   MSPIKE Not Observed 05/21/2023   SPEI Comment 10/02/2022     Chemistry      Component Value Date/Time   NA 136 05/21/2023 1445   NA 141 07/06/2017 0816   NA 141 01/05/2017 0956   K 3.6 05/21/2023 1445   K 3.6 07/06/2017 0816   K 4.3 01/05/2017 0956   CL 101 05/21/2023 1445   CL 101 07/06/2017 0816   CO2 23 05/21/2023 1445   CO2 28 07/06/2017 0816   CO2 23 01/05/2017 0956   BUN 38 (H) 05/21/2023 1445   BUN 28 (H) 07/06/2017 0816   BUN 22.4 01/05/2017 0956   CREATININE 1.78 (H) 05/21/2023 1445   CREATININE 1.6 (H) 07/06/2017 0816   CREATININE 1.4 (H) 01/05/2017 0956      Component Value Date/Time   CALCIUM 10.2 05/21/2023 1445   CALCIUM 9.1 07/06/2017 0816   CALCIUM 9.3 01/05/2017 0956   ALKPHOS 56 05/21/2023 1445   ALKPHOS 58 07/06/2017 0816   ALKPHOS 52 01/05/2017 0956    AST 18 05/21/2023 1445   AST 14 01/05/2017 0956   ALT 10 05/21/2023 1445   ALT 58 (H) 07/06/2017 0816   ALT 13 01/05/2017 0956   BILITOT 0.5 05/21/2023 1445   BILITOT 0.56 01/05/2017 0956       Impression and Plan: Marcia [name] is a very pleasant 72 yo caucasian female with kappa light chain myeloma that has recurred again.   It now looks like we are now dealing with recurrent breast cancer.  The PET scan does show multiple areas of bony involvement.  Again, we will have to see about radiation therapy for the left clavicular lesion.  Liquid biopsy will clearly help [place] out.  We will see about adding Cytoxan to the Faspro.  Again she does not want Kyprolis.  I had ordered a echocardiogram for her.  Hopefully this will be done this week.  We will plan to get her back on the 25th.  I would think that radiation should be done by then.  Which she had back are molecular liquid biopsy results for her breast cancer.    This is quite complex.  I understand the difficulties that we have.  However, thankfully, Marcia [name] is in great shape that she would be able to tolerate therapy.    Josph Macho, MD 10/2/20249:04 AM

## 2023-12-11 ENCOUNTER — Telehealth: Payer: Self-pay

## 2023-12-11 NOTE — Telephone Encounter (Signed)
Called [facility] at [phone], confirmed patient passed on 28-Jul-2023.
